# Patient Record
Sex: Female | Born: 1955 | Race: White | Hispanic: No | Marital: Married | Smoking: Former smoker
Health system: Southern US, Community
[De-identification: ages and names within clinical notes are randomized; demographics above are authoritative.]

## PROBLEM LIST (undated history)

## (undated) DIAGNOSIS — M1990 Unspecified osteoarthritis, unspecified site: Secondary | ICD-10-CM

## (undated) DIAGNOSIS — G459 Transient cerebral ischemic attack, unspecified: Secondary | ICD-10-CM

## (undated) DIAGNOSIS — Z973 Presence of spectacles and contact lenses: Secondary | ICD-10-CM

## (undated) DIAGNOSIS — Z8701 Personal history of pneumonia (recurrent): Secondary | ICD-10-CM

## (undated) DIAGNOSIS — Z8781 Personal history of (healed) traumatic fracture: Secondary | ICD-10-CM

## (undated) DIAGNOSIS — K635 Polyp of colon: Secondary | ICD-10-CM

## (undated) DIAGNOSIS — M810 Age-related osteoporosis without current pathological fracture: Secondary | ICD-10-CM

## (undated) DIAGNOSIS — F329 Major depressive disorder, single episode, unspecified: Secondary | ICD-10-CM

## (undated) DIAGNOSIS — Z808 Family history of malignant neoplasm of other organs or systems: Secondary | ICD-10-CM

## (undated) DIAGNOSIS — F1020 Alcohol dependence, uncomplicated: Secondary | ICD-10-CM

## (undated) DIAGNOSIS — N950 Postmenopausal bleeding: Secondary | ICD-10-CM

## (undated) DIAGNOSIS — Z8042 Family history of malignant neoplasm of prostate: Secondary | ICD-10-CM

## (undated) DIAGNOSIS — I10 Essential (primary) hypertension: Secondary | ICD-10-CM

## (undated) DIAGNOSIS — K219 Gastro-esophageal reflux disease without esophagitis: Secondary | ICD-10-CM

## (undated) DIAGNOSIS — F419 Anxiety disorder, unspecified: Secondary | ICD-10-CM

## (undated) DIAGNOSIS — E7800 Pure hypercholesterolemia, unspecified: Secondary | ICD-10-CM

## (undated) DIAGNOSIS — N840 Polyp of corpus uteri: Secondary | ICD-10-CM

## (undated) DIAGNOSIS — C541 Malignant neoplasm of endometrium: Secondary | ICD-10-CM

## (undated) DIAGNOSIS — I729 Aneurysm of unspecified site: Secondary | ICD-10-CM

## (undated) DIAGNOSIS — R200 Anesthesia of skin: Secondary | ICD-10-CM

## (undated) DIAGNOSIS — F32A Depression, unspecified: Secondary | ICD-10-CM

## (undated) DIAGNOSIS — F410 Panic disorder [episodic paroxysmal anxiety] without agoraphobia: Secondary | ICD-10-CM

## (undated) DIAGNOSIS — E041 Nontoxic single thyroid nodule: Secondary | ICD-10-CM

## (undated) DIAGNOSIS — K602 Anal fissure, unspecified: Secondary | ICD-10-CM

## (undated) DIAGNOSIS — Z87891 Personal history of nicotine dependence: Secondary | ICD-10-CM

## (undated) DIAGNOSIS — I517 Cardiomegaly: Secondary | ICD-10-CM

## (undated) DIAGNOSIS — Z8709 Personal history of other diseases of the respiratory system: Secondary | ICD-10-CM

## (undated) HISTORY — PX: TONSILLECTOMY: SUR1361

## (undated) HISTORY — DX: Family history of malignant neoplasm of other organs or systems: Z80.8

## (undated) HISTORY — DX: Personal history of nicotine dependence: Z87.891

## (undated) HISTORY — DX: Alcohol dependence, uncomplicated: F10.20

## (undated) HISTORY — DX: Polyp of colon: K63.5

## (undated) HISTORY — PX: KNEE ARTHROSCOPY: SUR90

## (undated) HISTORY — DX: Family history of malignant neoplasm of prostate: Z80.42

## (undated) HISTORY — PX: EYE SURGERY: SHX253

## (undated) HISTORY — PX: PLACEMENT OF BREAST IMPLANTS: SHX6334

## (undated) HISTORY — DX: Age-related osteoporosis without current pathological fracture: M81.0

---

## 1997-08-31 ENCOUNTER — Other Ambulatory Visit: Admission: RE | Admit: 1997-08-31 | Discharge: 1997-08-31 | Payer: Self-pay | Admitting: *Deleted

## 2001-07-04 ENCOUNTER — Other Ambulatory Visit: Admission: RE | Admit: 2001-07-04 | Discharge: 2001-07-04 | Payer: Self-pay | Admitting: *Deleted

## 2002-12-15 ENCOUNTER — Other Ambulatory Visit: Admission: RE | Admit: 2002-12-15 | Discharge: 2002-12-15 | Payer: Self-pay | Admitting: Obstetrics and Gynecology

## 2003-08-19 ENCOUNTER — Ambulatory Visit (HOSPITAL_COMMUNITY): Admission: RE | Admit: 2003-08-19 | Discharge: 2003-08-19 | Payer: Self-pay | Admitting: Surgery

## 2003-08-19 ENCOUNTER — Ambulatory Visit (HOSPITAL_BASED_OUTPATIENT_CLINIC_OR_DEPARTMENT_OTHER): Admission: RE | Admit: 2003-08-19 | Discharge: 2003-08-19 | Payer: Self-pay | Admitting: Surgery

## 2003-08-19 ENCOUNTER — Encounter (INDEPENDENT_AMBULATORY_CARE_PROVIDER_SITE_OTHER): Payer: Self-pay | Admitting: *Deleted

## 2004-02-15 ENCOUNTER — Ambulatory Visit: Payer: Self-pay | Admitting: Internal Medicine

## 2005-12-25 ENCOUNTER — Ambulatory Visit: Payer: Self-pay | Admitting: Internal Medicine

## 2008-05-12 ENCOUNTER — Ambulatory Visit: Payer: Self-pay | Admitting: Family Medicine

## 2008-05-12 DIAGNOSIS — J019 Acute sinusitis, unspecified: Secondary | ICD-10-CM

## 2008-05-12 LAB — CONVERTED CEMR LAB: Rapid Strep: NEGATIVE

## 2010-09-01 NOTE — Op Note (Signed)
Rebecca Gordon, Rebecca Gordon                         ACCOUNT NO.:  [id number]   MEDICAL RECORD NO.:  [id number]                   PATIENT TYPE:  AMB   LOCATION:  DSC                                  FACILITY:  [facility]   PHYSICIAN:  Sandria Bales. Ezzard Standing, M.D.               DATE OF BIRTH:  [birth year]   DATE OF PROCEDURE:  08/19/2003  DATE OF DISCHARGE:                                 OPERATIVE REPORT   PREOPERATIVE DIAGNOSIS:  Nodule, right forearm, possible foreign body  reaction.   POSTOPERATIVE DIAGNOSIS:  Nodule, right forearm, possible foreign body  reaction.   PROCEDURE:  Excision, nodule, right forearm.  This measures about 1 cm in  size.   SURGEON:  Sandria Bales. Ezzard Standing, M.D.   ANESTHESIA:  5 mL of 1% Xylocaine.   INDICATION FOR PROCEDURE:  Ms. Benedict felt that she got stuck with a stick  or something in her right forearm.  She did gardening last year, had a  persistent draining irritated mass on her right forearm.  She saw Dr.  Dorinda Hill, who biopsied this.  This showed fibrosing granulation with  polarizable material.  This was done on June 04, 2003.  Actually,  recently this thing has appeared to heal some but the patient still wants to  have this area excised, which I agree with if a possible foreign body in her  right forearm.   She presents to [facility] day surgery in the minor surgery room.  I cleaned the  area with Betadine solution and made an elliptical incision, which this mass  was about maybe 1 cm in size, excised the mass and closed it with  interrupted 3-0 nylon sutures.   I then sterilely dressed it.  She will see me back in 10 days for suture  removal and review of pathology.                                               Sandria Bales. Ezzard Standing, M.D.    DHN/MEDQ  D:  08/19/2003  T:  08/19/2003  Job:  [id number]   cc:   Dorinda Hill, M.D.   Angelena Sole, M.D. [facility]

## 2014-04-16 HISTORY — PX: CLAVICLE SURGERY: SHX598

## 2014-06-11 ENCOUNTER — Emergency Department (HOSPITAL_BASED_OUTPATIENT_CLINIC_OR_DEPARTMENT_OTHER)
Admission: EM | Admit: 2014-06-11 | Discharge: 2014-06-11 | Disposition: A | Discharge status: Home / Self Care | Payer: BLUE CROSS/BLUE SHIELD | Attending: Emergency Medicine | Admitting: Emergency Medicine

## 2014-06-11 ENCOUNTER — Emergency Department (HOSPITAL_BASED_OUTPATIENT_CLINIC_OR_DEPARTMENT_OTHER): Payer: BLUE CROSS/BLUE SHIELD

## 2014-06-11 ENCOUNTER — Encounter (HOSPITAL_BASED_OUTPATIENT_CLINIC_OR_DEPARTMENT_OTHER): Payer: Self-pay

## 2014-06-11 DIAGNOSIS — Y998 Other external cause status: Secondary | ICD-10-CM | POA: Diagnosis not present

## 2014-06-11 DIAGNOSIS — Y9289 Other specified places as the place of occurrence of the external cause: Secondary | ICD-10-CM | POA: Insufficient documentation

## 2014-06-11 DIAGNOSIS — Z87891 Personal history of nicotine dependence: Secondary | ICD-10-CM | POA: Diagnosis not present

## 2014-06-11 DIAGNOSIS — S42001A Fracture of unspecified part of right clavicle, initial encounter for closed fracture: Secondary | ICD-10-CM

## 2014-06-11 DIAGNOSIS — I10 Essential (primary) hypertension: Secondary | ICD-10-CM | POA: Diagnosis not present

## 2014-06-11 DIAGNOSIS — Y9389 Activity, other specified: Secondary | ICD-10-CM | POA: Insufficient documentation

## 2014-06-11 DIAGNOSIS — Z79899 Other long term (current) drug therapy: Secondary | ICD-10-CM | POA: Diagnosis not present

## 2014-06-11 DIAGNOSIS — S42024A Nondisplaced fracture of shaft of right clavicle, initial encounter for closed fracture: Secondary | ICD-10-CM | POA: Diagnosis not present

## 2014-06-11 DIAGNOSIS — W109XXA Fall (on) (from) unspecified stairs and steps, initial encounter: Secondary | ICD-10-CM | POA: Insufficient documentation

## 2014-06-11 DIAGNOSIS — S4991XA Unspecified injury of right shoulder and upper arm, initial encounter: Secondary | ICD-10-CM | POA: Diagnosis present

## 2014-06-11 HISTORY — DX: Essential (primary) hypertension: I10

## 2014-06-11 MED ORDER — OXYCODONE-ACETAMINOPHEN 5-325 MG PO TABS
1.0000 | ORAL_TABLET | Freq: Once | ORAL | Status: AC
  Administered 2014-06-11: 2 via ORAL
  Filled 2014-06-11: qty 2

## 2014-06-11 MED ORDER — OXYCODONE-ACETAMINOPHEN 5-325 MG PO TABS: 1.0000 | ORAL_TABLET | Freq: Four times a day (QID) | ORAL | Status: DC | PRN

## 2014-06-11 NOTE — ED Notes (Signed)
MD at bedside. 

## 2014-06-11 NOTE — Discharge Instructions (Signed)
Clavicle Fracture °The clavicle, also called the collarbone, is the long bone that connects your shoulder to your rib cage. You can feel your collarbone at the top of your shoulders and rib cage. A clavicle fracture is a broken clavicle. It is a common injury that can happen at any age.  °CAUSES °Common causes of a clavicle fracture include: °· A direct blow to your shoulder. °· A car accident. °· A fall, especially if you try to break your fall with an outstretched arm. °RISK FACTORS °You may be at increased risk if: °· You are younger than 25 years or older than 75 years. Most clavicle fractures happen to people who are younger than 25 years. °· You are a female. °· You play contact sports. °SIGNS AND SYMPTOMS °A fractured clavicle is painful. It also makes it hard to move your arm. Other signs and symptoms may include: °· A shoulder that drops downward and forward. °· Pain when trying to lift your shoulder. °· Bruising, swelling, and tenderness over your clavicle. °· A grinding noise when you try to move your shoulder. °· A bump over your clavicle. °DIAGNOSIS °Your health care provider can usually diagnose a clavicle fracture by asking about your injury and examining your shoulder and clavicle. He or she may take an X-ray to determine the position of your clavicle. °TREATMENT °Treatment depends on the position of your clavicle after the fracture: °· If the broken ends of the bone are not out of place, your health care provider may put your arm in a sling or wrap a support bandage around your chest (figure-of-eight wrap). °· If the broken ends of the bone are out of place, you may need surgery. Surgery may involve placing screws, pins, or plates to keep your clavicle stable while it heals. Healing may take about 3 months. °When your health care provider thinks your fracture has healed enough, you may have to do physical therapy to regain normal movement and build up your arm strength. °HOME CARE INSTRUCTIONS   °· Apply ice to the injured area: °¨ Put ice in a plastic bag. °¨ Place a towel between your skin and the bag. °¨ Leave the ice on for 20 minutes, 2-3 times a day. °· If you have a wrap or splint: °¨ Wear it all the time, and remove it only to take a bath or shower. °¨ When you bathe or shower, keep your shoulder in the same position as when the sling or wrap is on. °¨ Do not lift your arm. °· If you have a figure-of-eight wrap: °¨ Another person must tighten it every day. °¨ It should be tight enough to hold your shoulders back. °¨ Allow enough room to place your index finger between your body and the strap. °¨ Loosen the wrap immediately if you feel numbness or tingling in your hands. °· Only take medicines as directed by your health care provider. °· Avoid activities that make the injury or pain worse for 4-6 weeks after surgery. °· Keep all follow-up appointments. °SEEK MEDICAL CARE IF:  °Your medicine is not helping to relieve pain and swelling. °SEEK IMMEDIATE MEDICAL CARE IF:  °Your arm is numb, cold, or pale, even when the splint is loose. °MAKE SURE YOU:  °· Understand these instructions. °· Will watch your condition. °· Will get help right away if you are not doing well or get worse. °Document Released: 01/10/2005 Document Revised: 04/07/2013 Document Reviewed: 02/23/2013 °[employer]® Patient Information ©2015 [employer]. This information is   not intended to replace advice given to you by your health care provider. Make sure you discuss any questions you have with your health care provider.    Sling Use After Injury or Surgery You have been put in a sling today because of an injury or following surgery. If you have a tendon or bone injury it may take up to 6 weeks to heal. Use the sling as directed until your caregiver says it is no longer needed. The sling protects and keeps you from using the injured part. Hanging your arm in a sling will give rest and support to the injured part. This also helps  with comfort and healing. Slings are used for injuries made worse or more painful by movement. Examples include:  Broken arms.  Broken collarbones.  Shoulder injuries.  Following surgery. The sling should fit comfortably, with your elbow at one end of the sling and your hand at the other end. Your elbow is bent 90 degrees lying across your waist and rests in the sling with your thumb pointing up. Make sure that the hand of the injured arm does not droop down. That could stretch some nerves in the wrist. Your hand should be slightly higher than your elbow. You may also pad the sling behind your neck with some cloth or foam rubber.  A swathe may also be used if it is necessary to keep you from lifting your injured arm. A swathe is a wrap or ace bandage that goes around your chest over your injured arm.  To take the weight off your neck, some slings have a strap that goes around your neck and down your back. One strap is connected to the closed elbow side of the sling with the other end of the strap attached to the wrist side. With a sling like this, your injured shoulder, arm, wrist, or hand is in the sling, the weight is more on your shoulder and back. This is different from the illustration where the sling is supported only by the neck.  In an emergency, a sling can be as simple as a belt or towel tied around your neck to hold your forearm.  HOME CARE INSTRUCTIONS   Do not use your shoulder until instructed to by your caregiver.  If you have been prescribed physical therapy, keep appointments as directed.  For the first couple days following your injury and during times when you are sore, you may use ice on the injured area for 15-20 minutes 03-04 times per day while awake. Put the ice in a plastic bag and place a towel between the bag of ice and your skin. This will help keep the swelling down.  If there is numbness in the fifth finger and ring fingers you may need to pad the elbow to relieve  pressure on the ulnar nerve (the crazy bone).  Keep your arm on your chest when lying down.  If a plaster splint was applied, wear the splint until you are seen for a follow-up examination. Rest it on nothing harder than a pillow the first 24 hours. Do not get it wet. You may take it off to take a shower or bath unless instructed otherwise by your caregiver.  You may have been given an elastic bandage to use with the plaster splint or alone. The splint is too tight if you have numbness, tingling, or if your hand becomes cold and blue. Adjust or reapply the bandage to make it comfortable.  Only take over-the-counter  or prescription medicines for pain, discomfort, or fever as directed by your caregiver.  If range of motion exercises are permitted by your caregiver, do not go over the limits suggested. If you have increased pain from doing gentle exercises, stop the exercises until you see your caregiver again.  The length of time needed for healing depends on what your injury or surgery was. SEEK IMMEDIATE MEDICAL CARE IF:   You have an increase in bruising, swelling or pain in the area of your injury or surgery.  You notice a blue color of or coldness in your fingers.  Pain relief is not obtained with medications or any of your problems are getting worse. Document Released: 11/15/2003 Document Revised: 03/19/2012 Document Reviewed: 02/15/2007 [facility] Patient Information 2015 [place], [place]. This information is not intended to replace advice given to you by your health care provider. Make sure you discuss any questions you have with your health care provider.

## 2014-06-11 NOTE — ED Notes (Signed)
Pt reports missing a step last night and falling - now has discoloration to right collarbone, right shoulder pain, obvious deformity noted.

## 2014-06-11 NOTE — ED Provider Notes (Addendum)
CSN: [id number]     Arrival date & time 06/11/14  1135 History   First MD Initiated Contact with Patient 06/11/14 1229     Chief Complaint  Patient presents with  . Clavicle Injury     (Consider location/radiation/quality/duration/timing/severity/associated sxs/prior Treatment) HPI  59 year old female presents with right collarbone pain after falling down the stairs last night. She states she tripped and fell and landed directly on a stair on her right shoulder. She since developed swelling in neck and most is over her mid collarbone. No weakness or numbness in her arms or legs. Did not hit her head or neck. Holds her right shoulder pointed down but denies any shoulder pain or weakness. No trouble breathing. She has previously seen Dr. Onnie Graham of orthopedics.  Past Medical History  Diagnosis Date  . Hypertension    Past Surgical History  Procedure Laterality Date  . Tonsillectomy     History reviewed. No pertinent family history. History  Substance Use Topics  . Smoking status: Former Research scientist (life sciences)  . Smokeless tobacco: Not on file  . Alcohol Use: Yes     Comment: daily    OB History    No data available     Review of Systems  Musculoskeletal: Positive for joint swelling and arthralgias.  Skin: Positive for color change.  Neurological: Negative for weakness, numbness and headaches.  All other systems reviewed and are negative.     Allergies  Review of patient's allergies indicates no known allergies.  Home Medications   Prior to Admission medications   Medication Sig Start Date End Date Taking? Authorizing Provider  Blood Pressure KIT by Does not apply route.   Yes Historical Provider, MD  Meloxicam (MOBIC PO) Take by mouth.   Yes Historical Provider, MD  Rosuvastatin Calcium (CRESTOR PO) Take by mouth.   Yes Historical Provider, MD  SERTRALINE HCL PO Take by mouth.   Yes Historical Provider, MD  oxyCODONE-acetaminophen (PERCOCET) 5-325 MG per tablet Take 1-2 tablets by  mouth every 6 (six) hours as needed for severe pain. 06/11/14   Ephraim Hamburger, MD   BP 181/83 mmHg  Pulse 105  Temp(Src) 97.8 F (36.6 C)  Resp 20  Ht $R'5\' 4"'Ko$  (1.626 m)  Wt 135 lb (61.236 kg)  BMI 23.16 kg/m2  SpO2 98% Physical Exam  Constitutional: She is oriented to person, place, and time. She appears well-developed and well-nourished.  HENT:  Head: Normocephalic.    Right Ear: External ear normal.  Left Ear: External ear normal.  Nose: Nose normal.  Eyes: Right eye exhibits no discharge. Left eye exhibits no discharge.  Neck: Neck supple.  Cardiovascular: Intact distal pulses.   Pulses:      Radial pulses are 2+ on the right side.  Pulmonary/Chest: Effort normal and breath sounds normal. She exhibits tenderness and bony tenderness.    Musculoskeletal:       Right shoulder: She exhibits decreased range of motion. She exhibits no tenderness.  Normal strength, sensation and cap refill of right hand/arm.  Neurological: She is alert and oriented to person, place, and time.  Skin: Skin is warm and dry.  Nursing note and vitals reviewed.   ED Course  Procedures (including critical care time) Labs Review Labs Reviewed - No data to display  Imaging Review Dg Shoulder Right  06/11/2014   CLINICAL DATA:  [place], fell last night, pain.  Initial encounter.  EXAM: RIGHT SHOULDER - 2+ VIEW  COMPARISON:  None.  FINDINGS: No glenohumeral fracture or  dislocation. There is a comminuted mid clavicle fracture with overriding fragments. Soft tissue swelling.  IMPRESSION: Comminuted mid clavicle fracture with overriding.   Electronically Signed   By: Rolla Flatten M.D.   On: 06/11/2014 12:39   Ct Head Wo Contrast  06/11/2014   CLINICAL DATA:  Fall.  Head injury  EXAM: CT HEAD WITHOUT CONTRAST  TECHNIQUE: Contiguous axial images were obtained from the base of the skull through the vertex without intravenous contrast.  COMPARISON:  None.  FINDINGS: Ventricle size is normal. Negative for acute  or chronic infarction. Negative for hemorrhage or fluid collection. Negative for mass or edema. No shift of the midline structures.  Calvarium is intact. Right frontal scalp hematoma. Mild mucosal edema paranasal sinuses.  IMPRESSION: Right frontal scalp hematoma. No significant intracranial abnormality.   Electronically Signed   By: Franchot Gallo M.D.   On: 06/11/2014 13:53     EKG Interpretation None      MDM   Final diagnoses:  Right clavicle fracture, closed, initial encounter    Patient with a comminuted right clavicle fracture as above. No evidence of pneumothorax. No tenting of the skin or open fracture. Patient is neurovascular intact distally. Will give oral pain control, place and sling, and refer to orthopedics. Her shoulder appears located on the x-ray and she has no tenderness over her shoulder joint.  Ephraim Hamburger, MD 06/11/14 1325   After she was to be discharged she felt her head and noticed a knot. She then remembered that she did hit her head but did not lose consciousness. There is a soft hematoma there. Given this with a concurrent fracture, a CT was obtained and is negative for skull fracture or bleed. Stable for D/C    Ephraim Hamburger, MD 06/11/14 1420

## 2014-07-21 ENCOUNTER — Ambulatory Visit (INDEPENDENT_AMBULATORY_CARE_PROVIDER_SITE_OTHER): Payer: BLUE CROSS/BLUE SHIELD | Admitting: Psychiatry

## 2014-07-21 DIAGNOSIS — F1010 Alcohol abuse, uncomplicated: Secondary | ICD-10-CM | POA: Diagnosis not present

## 2014-07-27 ENCOUNTER — Ambulatory Visit (INDEPENDENT_AMBULATORY_CARE_PROVIDER_SITE_OTHER): Payer: BLUE CROSS/BLUE SHIELD | Admitting: Psychiatry

## 2014-07-27 DIAGNOSIS — F1010 Alcohol abuse, uncomplicated: Secondary | ICD-10-CM | POA: Diagnosis not present

## 2014-08-03 ENCOUNTER — Ambulatory Visit: Payer: BLUE CROSS/BLUE SHIELD | Admitting: Psychiatry

## 2014-08-05 ENCOUNTER — Ambulatory Visit (INDEPENDENT_AMBULATORY_CARE_PROVIDER_SITE_OTHER): Payer: BLUE CROSS/BLUE SHIELD | Admitting: Psychiatry

## 2014-08-05 DIAGNOSIS — F1010 Alcohol abuse, uncomplicated: Secondary | ICD-10-CM

## 2014-08-24 ENCOUNTER — Ambulatory Visit (INDEPENDENT_AMBULATORY_CARE_PROVIDER_SITE_OTHER): Payer: BLUE CROSS/BLUE SHIELD | Admitting: Psychiatry

## 2014-08-24 DIAGNOSIS — F1010 Alcohol abuse, uncomplicated: Secondary | ICD-10-CM

## 2014-09-07 ENCOUNTER — Ambulatory Visit (INDEPENDENT_AMBULATORY_CARE_PROVIDER_SITE_OTHER): Payer: BLUE CROSS/BLUE SHIELD | Admitting: Psychiatry

## 2014-09-07 DIAGNOSIS — F1010 Alcohol abuse, uncomplicated: Secondary | ICD-10-CM | POA: Diagnosis not present

## 2014-09-21 ENCOUNTER — Ambulatory Visit (INDEPENDENT_AMBULATORY_CARE_PROVIDER_SITE_OTHER): Payer: BLUE CROSS/BLUE SHIELD | Admitting: Psychiatry

## 2014-09-21 DIAGNOSIS — F1010 Alcohol abuse, uncomplicated: Secondary | ICD-10-CM

## 2014-10-07 ENCOUNTER — Ambulatory Visit: Payer: BLUE CROSS/BLUE SHIELD | Admitting: Psychiatry

## 2014-10-21 ENCOUNTER — Ambulatory Visit (INDEPENDENT_AMBULATORY_CARE_PROVIDER_SITE_OTHER): Payer: BLUE CROSS/BLUE SHIELD | Admitting: Psychiatry

## 2014-10-21 DIAGNOSIS — F1010 Alcohol abuse, uncomplicated: Secondary | ICD-10-CM

## 2014-11-25 ENCOUNTER — Ambulatory Visit: Payer: BLUE CROSS/BLUE SHIELD | Admitting: Psychiatry

## 2014-12-14 ENCOUNTER — Ambulatory Visit: Payer: BLUE CROSS/BLUE SHIELD | Admitting: Psychiatry

## 2014-12-15 ENCOUNTER — Other Ambulatory Visit: Payer: Self-pay | Admitting: Orthopedic Surgery

## 2014-12-15 DIAGNOSIS — Z4789 Encounter for other orthopedic aftercare: Secondary | ICD-10-CM

## 2014-12-21 ENCOUNTER — Other Ambulatory Visit: Payer: Self-pay | Admitting: Orthopedic Surgery

## 2014-12-21 ENCOUNTER — Ambulatory Visit
Admission: RE | Admit: 2014-12-21 | Discharge: 2014-12-21 | Disposition: A | Discharge status: Home / Self Care | Payer: BLUE CROSS/BLUE SHIELD | Source: Ambulatory Visit | Attending: Orthopedic Surgery | Admitting: Orthopedic Surgery

## 2014-12-21 DIAGNOSIS — Z4789 Encounter for other orthopedic aftercare: Secondary | ICD-10-CM

## 2015-04-17 DIAGNOSIS — Z8781 Personal history of (healed) traumatic fracture: Secondary | ICD-10-CM

## 2015-04-17 HISTORY — PX: COLONOSCOPY: SHX174

## 2015-04-17 HISTORY — DX: Personal history of (healed) traumatic fracture: Z87.81

## 2015-04-20 NOTE — Pre-Procedure Instructions (Addendum)
Rebecca Gordon  04/20/2015     Your procedure is scheduled on : Thursday April 28, 2015 at 10:00 AM.  Report to [facility] Admitting at 8:00 AM.  Call this number if you have problems the morning of surgery: [phone]    Remember:  Do not eat food or drink liquids after midnight.  Take these medicines the morning of surgery with A SIP OF WATER : Metoprolol (Toprol-XL), Esomeprazole (Nexium), Sertraline (Zoloft), Advair inhaler if needed   5 days prior to surgery, stop taking any vitamins, herbal medications, Meloxicam/Mobic, Mucinex DM, Omega 3, Tumeric, NSAIDS, Ibuprofen, Advil, Motrin, Aleve, etc   Do not wear jewelry, make-up or nail polish.  Do not wear lotions, powders, or perfumes.    Do not shave 48 hours prior to surgery.    Do not bring valuables to the hospital.  [facility] is not responsible for any belongings or valuables.  Contacts, dentures or bridgework may not be worn into surgery.  Leave your suitcase in the car.  After surgery it may be brought to your room.  For patients admitted to the hospital, discharge time will be determined by your treatment team.  Patients discharged the day of surgery will not be allowed to drive home.   Name and phone number of your driver:    Special instructions:  Shower using CHG soap the night before and the morning of your surgery  Please read over the following fact sheets that you were given. Pain Booklet, Coughing and Deep Breathing and Surgical Site Infection Prevention

## 2015-04-21 ENCOUNTER — Encounter (HOSPITAL_COMMUNITY)
Admission: RE | Admit: 2015-04-21 | Discharge: 2015-04-21 | Disposition: A | Discharge status: Home / Self Care | Payer: BLUE CROSS/BLUE SHIELD | Source: Ambulatory Visit | Attending: Orthopedic Surgery | Admitting: Orthopedic Surgery

## 2015-04-21 ENCOUNTER — Encounter (HOSPITAL_COMMUNITY): Payer: Self-pay

## 2015-04-21 DIAGNOSIS — Z01812 Encounter for preprocedural laboratory examination: Secondary | ICD-10-CM | POA: Insufficient documentation

## 2015-04-21 DIAGNOSIS — I10 Essential (primary) hypertension: Secondary | ICD-10-CM | POA: Diagnosis not present

## 2015-04-21 DIAGNOSIS — S42021A Displaced fracture of shaft of right clavicle, initial encounter for closed fracture: Secondary | ICD-10-CM | POA: Diagnosis not present

## 2015-04-21 DIAGNOSIS — Z01818 Encounter for other preprocedural examination: Secondary | ICD-10-CM | POA: Diagnosis present

## 2015-04-21 DIAGNOSIS — X58XXXA Exposure to other specified factors, initial encounter: Secondary | ICD-10-CM | POA: Diagnosis not present

## 2015-04-21 HISTORY — DX: Major depressive disorder, single episode, unspecified: F32.9

## 2015-04-21 HISTORY — DX: Personal history of pneumonia (recurrent): Z87.01

## 2015-04-21 HISTORY — DX: Gastro-esophageal reflux disease without esophagitis: K21.9

## 2015-04-21 HISTORY — DX: Anxiety disorder, unspecified: F41.9

## 2015-04-21 HISTORY — DX: Panic disorder (episodic paroxysmal anxiety): F41.0

## 2015-04-21 HISTORY — DX: Depression, unspecified: F32.A

## 2015-04-21 HISTORY — DX: Pure hypercholesterolemia, unspecified: E78.00

## 2015-04-21 HISTORY — DX: Personal history of other diseases of the respiratory system: Z87.09

## 2015-04-21 LAB — CBC WITH DIFFERENTIAL/PLATELET
BASOS ABS: 0 10*3/uL (ref 0.0–0.1)
BASOS PCT: 0 %
EOS PCT: 2 %
Eosinophils Absolute: 0.2 10*3/uL (ref 0.0–0.7)
HCT: 45.3 % (ref 36.0–46.0)
Hemoglobin: 15.2 g/dL — ABNORMAL HIGH (ref 12.0–15.0)
Lymphocytes Relative: 14 %
Lymphs Abs: 1.6 10*3/uL (ref 0.7–4.0)
MCH: 32.9 pg (ref 26.0–34.0)
MCHC: 33.6 g/dL (ref 30.0–36.0)
MCV: 98.1 fL (ref 78.0–100.0)
MONO ABS: 0.5 10*3/uL (ref 0.1–1.0)
Monocytes Relative: 4 %
Neutro Abs: 8.8 10*3/uL — ABNORMAL HIGH (ref 1.7–7.7)
Neutrophils Relative %: 80 %
PLATELETS: 254 10*3/uL (ref 150–400)
RBC: 4.62 MIL/uL (ref 3.87–5.11)
RDW: 12.6 % (ref 11.5–15.5)
WBC: 11.1 10*3/uL — AB (ref 4.0–10.5)

## 2015-04-21 LAB — COMPREHENSIVE METABOLIC PANEL
ALBUMIN: 4.1 g/dL (ref 3.5–5.0)
ALT: 34 U/L (ref 14–54)
AST: 21 U/L (ref 15–41)
Alkaline Phosphatase: 69 U/L (ref 38–126)
Anion gap: 9 (ref 5–15)
BUN: 14 mg/dL (ref 6–20)
CALCIUM: 9.5 mg/dL (ref 8.9–10.3)
CO2: 26 mmol/L (ref 22–32)
CREATININE: 0.59 mg/dL (ref 0.44–1.00)
Chloride: 104 mmol/L (ref 101–111)
GFR calc Af Amer: >60 mL/min (ref >60)
GFR calc non Af Amer: >60 mL/min (ref >60)
Glucose, Bld: 104 mg/dL — ABNORMAL HIGH (ref 65–99)
POTASSIUM: 4.3 mmol/L (ref 3.5–5.1)
SODIUM: 139 mmol/L (ref 135–145)
TOTAL PROTEIN: 6.8 g/dL (ref 6.5–8.1)
Total Bilirubin: 0.6 mg/dL (ref 0.3–1.2)

## 2015-04-21 LAB — PROTIME-INR
INR: 0.94 (ref 0.00–1.49)
Prothrombin Time: 12.8 seconds (ref 11.6–15.2)

## 2015-04-21 LAB — APTT: APTT: 29 s (ref 24–37)

## 2015-04-21 NOTE — Progress Notes (Signed)
PCP - Lennie Odor, PA at [facility] - denies  EKG - 04/21/15 CXR - denies  Echo/Stress test/Cardiac cath - denies  Patient denies chest pain and shortness of breath at PAT appointment.  Patient states that she has had a cold for approximately 4 days and had an appointment with her PCP on 04/20/15.  Requested LOV from PCP.  Patient denies a fever.

## 2015-04-28 ENCOUNTER — Ambulatory Visit (HOSPITAL_COMMUNITY): Payer: BLUE CROSS/BLUE SHIELD | Admitting: Anesthesiology

## 2015-04-28 ENCOUNTER — Ambulatory Visit (HOSPITAL_COMMUNITY): Payer: BLUE CROSS/BLUE SHIELD

## 2015-04-28 ENCOUNTER — Encounter (HOSPITAL_COMMUNITY): Payer: Self-pay | Admitting: *Deleted

## 2015-04-28 ENCOUNTER — Encounter (HOSPITAL_COMMUNITY)
Admission: RE | Disposition: A | Discharge status: Home / Self Care | Payer: Self-pay | Source: Ambulatory Visit | Attending: Orthopedic Surgery

## 2015-04-28 ENCOUNTER — Ambulatory Visit (HOSPITAL_COMMUNITY)
Admission: RE | Admit: 2015-04-28 | Discharge: 2015-04-28 | Disposition: A | Discharge status: Home / Self Care | Payer: BLUE CROSS/BLUE SHIELD | Source: Ambulatory Visit | Attending: Orthopedic Surgery | Admitting: Orthopedic Surgery

## 2015-04-28 DIAGNOSIS — I10 Essential (primary) hypertension: Secondary | ICD-10-CM | POA: Diagnosis not present

## 2015-04-28 DIAGNOSIS — Z791 Long term (current) use of non-steroidal anti-inflammatories (NSAID): Secondary | ICD-10-CM | POA: Insufficient documentation

## 2015-04-28 DIAGNOSIS — S42021K Displaced fracture of shaft of right clavicle, subsequent encounter for fracture with nonunion: Secondary | ICD-10-CM | POA: Insufficient documentation

## 2015-04-28 DIAGNOSIS — M1711 Unilateral primary osteoarthritis, right knee: Secondary | ICD-10-CM | POA: Diagnosis not present

## 2015-04-28 DIAGNOSIS — Z87891 Personal history of nicotine dependence: Secondary | ICD-10-CM | POA: Insufficient documentation

## 2015-04-28 DIAGNOSIS — E7800 Pure hypercholesterolemia, unspecified: Secondary | ICD-10-CM | POA: Diagnosis not present

## 2015-04-28 DIAGNOSIS — Z79899 Other long term (current) drug therapy: Secondary | ICD-10-CM | POA: Diagnosis not present

## 2015-04-28 DIAGNOSIS — X58XXXD Exposure to other specified factors, subsequent encounter: Secondary | ICD-10-CM | POA: Diagnosis not present

## 2015-04-28 DIAGNOSIS — Z419 Encounter for procedure for purposes other than remedying health state, unspecified: Secondary | ICD-10-CM

## 2015-04-28 DIAGNOSIS — K219 Gastro-esophageal reflux disease without esophagitis: Secondary | ICD-10-CM | POA: Diagnosis not present

## 2015-04-28 HISTORY — PX: ORIF CLAVICULAR FRACTURE: SHX5055

## 2015-04-28 SURGERY — OPEN REDUCTION INTERNAL FIXATION (ORIF) CLAVICULAR FRACTURE
Anesthesia: General | Laterality: Right

## 2015-04-28 MED ORDER — FENTANYL CITRATE (PF) 250 MCG/5ML IJ SOLN
INTRAMUSCULAR | Status: AC
  Filled 2015-04-28: qty 5

## 2015-04-28 MED ORDER — CHLORHEXIDINE GLUCONATE 4 % EX LIQD: 60.0000 mL | Freq: Once | CUTANEOUS | Status: DC

## 2015-04-28 MED ORDER — ROCURONIUM BROMIDE 100 MG/10ML IV SOLN
INTRAVENOUS | Status: DC | PRN
  Administered 2015-04-28: 40 mg via INTRAVENOUS
  Administered 2015-04-28: 10 mg via INTRAVENOUS

## 2015-04-28 MED ORDER — ONDANSETRON HCL 4 MG/2ML IJ SOLN
INTRAMUSCULAR | Status: DC | PRN
Start: 2015-04-28 — End: 2015-04-28
  Administered 2015-04-28: 4 mg via INTRAVENOUS

## 2015-04-28 MED ORDER — GLYCOPYRROLATE 0.2 MG/ML IJ SOLN
INTRAMUSCULAR | Status: AC
  Filled 2015-04-28: qty 2

## 2015-04-28 MED ORDER — ONDANSETRON HCL 4 MG/2ML IJ SOLN
INTRAMUSCULAR | Status: AC
  Filled 2015-04-28: qty 2

## 2015-04-28 MED ORDER — PROMETHAZINE HCL 25 MG/ML IJ SOLN: 6.2500 mg | INTRAMUSCULAR | Status: DC | PRN

## 2015-04-28 MED ORDER — FENTANYL CITRATE (PF) 100 MCG/2ML IJ SOLN
INTRAMUSCULAR | Status: DC | PRN
  Administered 2015-04-28 (×2): 50 ug via INTRAVENOUS

## 2015-04-28 MED ORDER — CEFAZOLIN SODIUM-DEXTROSE 2-3 GM-% IV SOLR
2.0000 g | INTRAVENOUS | Status: AC
  Administered 2015-04-28: 2 g via INTRAVENOUS
  Filled 2015-04-28: qty 50

## 2015-04-28 MED ORDER — OXYCODONE HCL 5 MG PO TABS: 5.0000 mg | ORAL_TABLET | Freq: Once | ORAL | Status: DC | PRN

## 2015-04-28 MED ORDER — MIDAZOLAM HCL 2 MG/2ML IJ SOLN
INTRAMUSCULAR | Status: AC
  Administered 2015-04-28: 2 mg
  Filled 2015-04-28: qty 2

## 2015-04-28 MED ORDER — LIDOCAINE HCL (CARDIAC) 20 MG/ML IV SOLN
INTRAVENOUS | Status: DC | PRN
  Administered 2015-04-28: 30 mg via INTRAVENOUS
  Administered 2015-04-28: 60 mg via INTRAVENOUS

## 2015-04-28 MED ORDER — MIDAZOLAM HCL 2 MG/2ML IJ SOLN: 2.0000 mg | Freq: Once | INTRAMUSCULAR | Status: DC

## 2015-04-28 MED ORDER — SODIUM CHLORIDE 0.9 % IV SOLN
10.0000 mg | INTRAVENOUS | Status: DC | PRN
  Administered 2015-04-28: 50 ug/min via INTRAVENOUS

## 2015-04-28 MED ORDER — LACTATED RINGERS IV SOLN
INTRAVENOUS | Status: DC
  Administered 2015-04-28: 09:00:00 via INTRAVENOUS

## 2015-04-28 MED ORDER — NEOSTIGMINE METHYLSULFATE 10 MG/10ML IV SOLN
INTRAVENOUS | Status: DC | PRN
Start: 2015-04-28 — End: 2015-04-28
  Administered 2015-04-28: 3 mg via INTRAVENOUS

## 2015-04-28 MED ORDER — OXYCODONE HCL 5 MG/5ML PO SOLN: 5.0000 mg | Freq: Once | ORAL | Status: DC | PRN

## 2015-04-28 MED ORDER — LIDOCAINE HCL (CARDIAC) 20 MG/ML IV SOLN
INTRAVENOUS | Status: AC
  Filled 2015-04-28: qty 5

## 2015-04-28 MED ORDER — FENTANYL CITRATE (PF) 100 MCG/2ML IJ SOLN
50.0000 ug | Freq: Once | INTRAMUSCULAR | Status: AC
  Administered 2015-04-28: 50 ug via INTRAVENOUS

## 2015-04-28 MED ORDER — LACTATED RINGERS IV SOLN
INTRAVENOUS | Status: DC | PRN
Start: 2015-04-28 — End: 2015-04-28

## 2015-04-28 MED ORDER — ONDANSETRON HCL 4 MG PO TABS: 4.0000 mg | ORAL_TABLET | Freq: Three times a day (TID) | ORAL | Status: DC | PRN

## 2015-04-28 MED ORDER — PROPOFOL 10 MG/ML IV BOLUS
INTRAVENOUS | Status: AC
  Filled 2015-04-28: qty 20

## 2015-04-28 MED ORDER — HYDROMORPHONE HCL 1 MG/ML IJ SOLN: 0.2500 mg | INTRAMUSCULAR | Status: DC | PRN

## 2015-04-28 MED ORDER — OXYCODONE-ACETAMINOPHEN 5-325 MG PO TABS: 1.0000 | ORAL_TABLET | ORAL | Status: DC | PRN

## 2015-04-28 MED ORDER — PROPOFOL 10 MG/ML IV BOLUS
INTRAVENOUS | Status: DC | PRN
  Administered 2015-04-28: 150 mg via INTRAVENOUS
  Administered 2015-04-28: 50 mg via INTRAVENOUS

## 2015-04-28 MED ORDER — BUPIVACAINE-EPINEPHRINE (PF) 0.5% -1:200000 IJ SOLN
INTRAMUSCULAR | Status: DC | PRN
  Administered 2015-04-28: 30 mL via PERINEURAL

## 2015-04-28 MED ORDER — 0.9 % SODIUM CHLORIDE (POUR BTL) OPTIME
TOPICAL | Status: DC | PRN
  Administered 2015-04-28: 1000 mL

## 2015-04-28 MED ORDER — FENTANYL CITRATE (PF) 100 MCG/2ML IJ SOLN
INTRAMUSCULAR | Status: AC
  Filled 2015-04-28: qty 2

## 2015-04-28 MED ORDER — GLYCOPYRROLATE 0.2 MG/ML IJ SOLN
INTRAMUSCULAR | Status: DC | PRN
  Administered 2015-04-28: 0.4 mg via INTRAVENOUS

## 2015-04-28 MED ORDER — ROCURONIUM BROMIDE 50 MG/5ML IV SOLN
INTRAVENOUS | Status: AC
  Filled 2015-04-28: qty 1

## 2015-04-28 MED ORDER — METHOCARBAMOL 500 MG PO TABS: 500.0000 mg | ORAL_TABLET | Freq: Three times a day (TID) | ORAL | Status: DC | PRN

## 2015-04-28 SURGICAL SUPPLY — 66 items
ADH SKN CLS APL DERMABOND .7 (GAUZE/BANDAGES/DRESSINGS) ×1
BIT DRILL 2.8X5 QR DISP (BIT) ×2 IMPLANT
CLOSURE WOUND 1/2 X4 (GAUZE/BANDAGES/DRESSINGS) ×1
COVER SURGICAL LIGHT HANDLE (MISCELLANEOUS) ×3 IMPLANT
DERMABOND ADVANCED (GAUZE/BANDAGES/DRESSINGS) ×2
DERMABOND ADVANCED .7 DNX12 (GAUZE/BANDAGES/DRESSINGS) ×1 IMPLANT
DRAPE C-ARM 42X72 X-RAY (DRAPES) ×3 IMPLANT
DRAPE IMP U-DRAPE 54X76 (DRAPES) ×3 IMPLANT
DRAPE INCISE IOBAN 66X45 STRL (DRAPES) ×3 IMPLANT
DRAPE ORTHO SPLIT 77X108 STRL (DRAPES) ×3
DRAPE SHEET LG 3/4 BI-LAMINATE (DRAPES) ×2 IMPLANT
DRAPE SURG 17X23 STRL (DRAPES) ×3 IMPLANT
DRAPE SURG ORHT 6 SPLT 77X108 (DRAPES) ×1 IMPLANT
DRAPE U-SHAPE 47X51 STRL (DRAPES) ×6 IMPLANT
DRSG EMULSION OIL 3X3 NADH (GAUZE/BANDAGES/DRESSINGS) ×3 IMPLANT
DRSG MEPILEX BORDER 4X8 (GAUZE/BANDAGES/DRESSINGS) ×3 IMPLANT
ELECT REM PT RETURN 9FT ADLT (ELECTROSURGICAL) ×3
ELECTRODE REM PT RTRN 9FT ADLT (ELECTROSURGICAL) ×1 IMPLANT
GAUZE SPONGE 4X4 12PLY STRL (GAUZE/BANDAGES/DRESSINGS) ×3 IMPLANT
GLOVE BIO SURGEON STRL SZ7.5 (GLOVE) ×3 IMPLANT
GLOVE BIO SURGEON STRL SZ8 (GLOVE) ×3 IMPLANT
GLOVE BIOGEL M 7.0 STRL (GLOVE) ×2 IMPLANT
GLOVE BIOGEL PI IND STRL 6 (GLOVE) IMPLANT
GLOVE BIOGEL PI IND STRL 7.0 (GLOVE) IMPLANT
GLOVE BIOGEL PI INDICATOR 6 (GLOVE) ×2
GLOVE BIOGEL PI INDICATOR 7.0 (GLOVE) ×2
GLOVE EUDERMIC 7 POWDERFREE (GLOVE) ×3 IMPLANT
GLOVE SS BIOGEL STRL SZ 7.5 (GLOVE) ×1 IMPLANT
GLOVE SUPERSENSE BIOGEL SZ 7.5 (GLOVE) ×2
GLOVE SURG SS PI 6.0 STRL IVOR (GLOVE) ×2 IMPLANT
GOWN STRL REUS W/ TWL LRG LVL3 (GOWN DISPOSABLE) ×1 IMPLANT
GOWN STRL REUS W/ TWL XL LVL3 (GOWN DISPOSABLE) ×2 IMPLANT
GOWN STRL REUS W/TWL LRG LVL3 (GOWN DISPOSABLE) ×3
GOWN STRL REUS W/TWL XL LVL3 (GOWN DISPOSABLE) ×12
KIT BASIN OR (CUSTOM PROCEDURE TRAY) ×3 IMPLANT
KIT ROOM TURNOVER OR (KITS) ×3 IMPLANT
MANIFOLD NEPTUNE II (INSTRUMENTS) ×3 IMPLANT
NDL HYPO 25GX1X1/2 BEV (NEEDLE) ×1 IMPLANT
NEEDLE HYPO 25GX1X1/2 BEV (NEEDLE) ×3 IMPLANT
NS IRRIG 1000ML POUR BTL (IV SOLUTION) ×3 IMPLANT
PACK SHOULDER (CUSTOM PROCEDURE TRAY) ×3 IMPLANT
PACK UNIVERSAL I (CUSTOM PROCEDURE TRAY) ×3 IMPLANT
PAD ARMBOARD 7.5X6 YLW CONV (MISCELLANEOUS) ×6 IMPLANT
PUTTY BONE DBX 5CC MIX (Putty) ×2 IMPLANT
SCREW LOCK 12X3.5X HEXALOBE (Screw) IMPLANT
SCREW LOCKING 3.5X10MM (Screw) ×2 IMPLANT
SCREW LOCKING 3.5X12 (Screw) ×3 IMPLANT
SCREW NON LOCK 3.5X10MM (Screw) ×2 IMPLANT
SCREW NONLOCK HEX 3.5X12 (Screw) ×2 IMPLANT
SLING ARM FOAM STRAP LRG (SOFTGOODS) ×3 IMPLANT
SPONGE LAP 18X18 X RAY DECT (DISPOSABLE) ×6 IMPLANT
SPONGE LAP 4X18 X RAY DECT (DISPOSABLE) ×6 IMPLANT
STAPLER VISISTAT 35W (STAPLE) ×3 IMPLANT
STRIP CLOSURE SKIN 1/2X4 (GAUZE/BANDAGES/DRESSINGS) ×2 IMPLANT
SUCTION FRAZIER TIP 10 FR DISP (SUCTIONS) ×3 IMPLANT
SUT MNCRL AB 3-0 PS2 18 (SUTURE) ×3 IMPLANT
SUT VIC AB 1 CT1 27 (SUTURE) ×3
SUT VIC AB 1 CT1 27XBRD ANBCTR (SUTURE) ×1 IMPLANT
SUT VIC AB 2-0 CT1 27 (SUTURE) ×3
SUT VIC AB 2-0 CT1 TAPERPNT 27 (SUTURE) ×1 IMPLANT
SUT VICRYL 0 CT 1 36IN (SUTURE) ×3 IMPLANT
SYR CONTROL 10ML LL (SYRINGE) ×3 IMPLANT
TOWEL OR 17X24 6PK STRL BLUE (TOWEL DISPOSABLE) ×3 IMPLANT
TOWEL OR 17X26 10 PK STRL BLUE (TOWEL DISPOSABLE) ×3 IMPLANT
WATER STERILE IRR 1000ML POUR (IV SOLUTION) ×3 IMPLANT
YANKAUER SUCT BULB TIP NO VENT (SUCTIONS) ×3 IMPLANT

## 2015-04-28 NOTE — Anesthesia Preprocedure Evaluation (Addendum)
Anesthesia Evaluation  Patient identified by MRN, date of birth, ID band Patient awake    Reviewed: Allergy & Precautions, NPO status , Patient's Chart, lab work & pertinent test results  Airway Mallampati: I  TM Distance: >3 FB Neck ROM: Full    Dental  (+) Teeth Intact, Dental Advisory Given   Pulmonary former smoker,    breath sounds clear to auscultation       Cardiovascular hypertension, Pt. on medications and Pt. on home beta blockers  Rhythm:Regular Rate:Normal     Neuro/Psych Anxiety Depression negative neurological ROS     GI/Hepatic Neg liver ROS, GERD  ,  Endo/Other  negative endocrine ROS  Renal/GU negative Renal ROS     Musculoskeletal  (+) Arthritis ,   Abdominal   Peds  Hematology negative hematology ROS (+)   Anesthesia Other Findings   Reproductive/Obstetrics                         Lab Results  Component Value Date   CREATININE 0.59 04/21/2015   BUN 14 04/21/2015   NA 139 04/21/2015   K 4.3 04/21/2015   CL 104 04/21/2015   CO2 26 04/21/2015   Lab Results  Component Value Date   WBC 11.1* 04/21/2015   HGB 15.2* 04/21/2015   HCT 45.3 04/21/2015   MCV 98.1 04/21/2015   PLT 254 04/21/2015    Anesthesia Physical Anesthesia Plan  ASA: II  Anesthesia Plan: General and Regional   Post-op Pain Management: GA combined w/ Regional for post-op pain   Induction: Intravenous  Airway Management Planned: Oral ETT  Additional Equipment:   Intra-op Plan:   Post-operative Plan: Extubation in OR  Informed Consent: I have reviewed the patients History and Physical, chart, labs and discussed the procedure including the risks, benefits and alternatives for the proposed anesthesia with the patient or authorized representative who has indicated his/her understanding and acceptance.   Dental advisory given  Plan Discussed with: CRNA  Anesthesia Plan Comments:         Anesthesia Quick Evaluation

## 2015-04-28 NOTE — Anesthesia Postprocedure Evaluation (Signed)
Anesthesia Post Note  Patient: Rebecca Gordon  Procedure(s) Performed: Procedure(s) (LRB): REVISION ORIF RIGHT CLAVICAL FRACTURE, ALLOGRAFT BONE GRAFTING (Right)  Patient location during evaluation: PACU Anesthesia Type: General and Regional Level of consciousness: awake and alert Pain management: pain level controlled Vital Signs Assessment: post-procedure vital signs reviewed and stable Respiratory status: spontaneous breathing Cardiovascular status: blood pressure returned to baseline Anesthetic complications: no    Last Vitals:  Filed Vitals:   04/28/15 1220 04/28/15 1226  BP: 126/69 146/64  Pulse: 80   Temp: 36.5 C 36.5 C  Resp: 16 14    Last Pain: There were no vitals filed for this visit.               Tiajuana Amass

## 2015-04-28 NOTE — Op Note (Signed)
NAMECONCEPCION, MCGURRIN NO.:  [id number]  MEDICAL RECORD NO.:  [id number]  LOCATION:  [facility]                         FACILITY:  [facility]  PHYSICIAN:  Metta Clines. Rebekka Lobello, M.D.  DATE OF BIRTH:  04/22/1955  DATE OF PROCEDURE:  04/28/2015 DATE OF DISCHARGE:                              OPERATIVE REPORT   PREOPERATIVE DIAGNOSIS:  Right midshaft clavicle fracture nonunion.  POSTOPERATIVE DIAGNOSIS:  Right midshaft clavicle fracture nonunion.  PROCEDURE:  Revision open reduction and internal fixation of right clavicle nonunion with allograft bone grafting.  SURGEON:  Metta Clines. Meggan Dhaliwal, M.D.  Terrence DupontOlivia Mackie A. Shuford, P.A.-C.  ANESTHESIA:  General endotracheal as well as an interscalene block.  ESTIMATED BLOOD LOSS:  Minimal.  DRAINS:  None.  HISTORY:  Ms. Belmer is a 60 year old female, who sustained a severely comminuted and displaced right midshaft clavicle fracture, for which, we had performed an initial open reduction and internal fixation number of months ago.  We have been following her clinically in well, she has done well with mobility, strength and function.  She has had radiographs, which show continued lucency at the fracture site, which has been somewhat progressive and she is now developing some discomfort about the shoulder girdle with motion.  CT scan does confirm there is a nonunion and due to the concern for impending failure of the fixation, she is brought to the operating room at this time for planned revision open reduction and internal fixation with bone grafting.  Preoperatively, I counseled Ms. Franchot Mimes regarding treatment options and potential risks versus benefits thereof.  Possible surgical complications were once again reviewed including the possibility of bleeding, infection, neurovascular injury, malunion, nonunion, loss of fixation, anesthetic complication, and possible need for additional surgery.  She understands and accepts and  agrees with our plan.  PROCEDURE IN DETAIL:  After undergoing routine preop evaluation, the patient did receive prophylactic antibiotics.  An interscalene block was established in holding area by the Anesthesia Department.  Placed supine on the operating table, underwent smooth induction of general endotracheal anesthesia.  Placed into beach-chair position and appropriately padded and protected.  Right shoulder girdle region was sterilely prepped and draped in standard fashion.  Time-out was called. We made a transverse incision along the infraclavicular region on the right following the same previous surgical incision.  Skin flaps were elevated and dissection carried deeply allowing [place] to expose the previously placed hardware and clavicle, focused on the midclavicular region at the site of nonunion.  We exposed the medial half of the plate presuming the soft tissue as the lateral sides since there was good fixation laterally and we felt that we could simply mobilize the medial fragment to allow clean out debridement of the nonunion site and then compression.  We gained circumferential exposure about the clavicle at the nonunion site.  Then, we carefully removed all the interposed soft tissue.  There were two interfragmentary screws, which were certainly loose and these were removed without difficulty and then we went ahead and used a rongeur and Kerrison rongeur to debride all soft tissue and debride the bone back to clean and healthy-appearing margins.  Once we were satisfied with  the debridement of all fibrous tissue at the nonunion site, we then removed the three medial screws.  We then utilized a combination of demineralized bone matrix as well as some cortical cancellous chips and a slurry and this was then interposed circumferentially at the nonunion site and then I used a tenaculum to allow compression between the medial and lateral margins of the clavicle to create direct  compression at the nonunion site between the bone end and this allowed excellent compression at the nonunion site with direct bone-to-bone contact and then used a compression lag screw on the medial limb, which obtained excellent bony fixation.  We then placed two locking screws medially.  At the completion, fluoroscopic images showed good overall alignment and good position of the hardware.  We then used the balance of the material from our combined demineralized bone matrix and corticocancellous chips to pack around the fracture site circumferentially and then carefully closed a thick soft tissue envelope to hold all of bone graft material and proper position about the nonunion site.  This was performed, closing the deep layer with a series of figure-of-eight #1 Vicryl sutures.  2-0 Monocryl was used for the subcu layer, intracuticular 3-0 Monocryl for the skin followed by Dermabond and then a Mepilex dressing.  Right arm was then placed into a sling.  The patient was awakened, extubated, and transferred to the recovery room in stable condition.  Jenetta Loges, PA-C was used as an Environmental consultant throughout this case, essential for help with positioning of the patient, positioning of the extremity, tissue retraction, manipulation of the fracture site, wound closer and intraoperative decision making.     Metta Clines. Hyland Mollenkopf, M.D.     KMS/MEDQ  D:  04/28/2015  T:  04/28/2015  Job:  [id number]

## 2015-04-28 NOTE — Op Note (Signed)
04/28/2015  11:58 AM  PATIENT:   Rebecca Gordon  60 y.o. female  PRE-OPERATIVE DIAGNOSIS:  RIGHT CLAVICLE NON UNION  POST-OPERATIVE DIAGNOSIS:  same  PROCEDURE:  Revision ORIF with bone grafting  SURGEON:  Meika Earll, Metta Clines. M.D.  ASSISTANTS: Shuford pac   ANESTHESIA:   GET + ISB  EBL: min  SPECIMEN:  none  Drains: none   PATIENT DISPOSITION:  PACU - hemodynamically stable.    PLAN OF CARE: Discharge to home after PACU  Dictation# [id number]   Contact # [phone]

## 2015-04-28 NOTE — Discharge Instructions (Signed)
° °   Ok to allow arm to dangle and to move it for hygiene. Sling on but can remove to allow arm to dangle and move elbow wrist and hand. Leave current dressing on until day 3, then can remove to perform dressing changes  You may shower on day 3 and allow water to run across incision, then pat it dry and perform dressing change Call [phone] for any questions or concerns   Discontinue your mobic and all anti inflammatories such as advil, aleve and ibuprofens until fracture is healed

## 2015-04-28 NOTE — Transfer of Care (Signed)
Immediate Anesthesia Transfer of Care Note  Patient: Rebecca Gordon  Procedure(s) Performed: Procedure(s): REVISION ORIF RIGHT CLAVICAL FRACTURE, ALLOGRAFT BONE GRAFTING (Right)  Patient Location: PACU  Anesthesia Type:GA combined with regional for post-op pain  Level of Consciousness: awake, alert  and oriented  Airway & Oxygen Therapy: Patient Spontanous Breathing and Patient connected to nasal cannula oxygen  Post-op Assessment: Report given to RN and Post -op Vital signs reviewed and stable  Post vital signs: Reviewed and stable  Last Vitals:  Filed Vitals:   04/28/15 0950 04/28/15 0955  BP: 144/55 123/62  Pulse: 85 79  Temp:    Resp: 15 14    Complications: No apparent anesthesia complications

## 2015-04-28 NOTE — Addendum Note (Signed)
Addendum  created 04/28/15 1415 by Kyung Rudd, CRNA   Modules edited: Anesthesia Medication Administration

## 2015-04-28 NOTE — Anesthesia Procedure Notes (Addendum)
Anesthesia Regional Block:  Interscalene brachial plexus block  Pre-Anesthetic Checklist: ,, timeout performed, Correct Patient, Correct Site, Correct Laterality, Correct Procedure, Correct Position, site marked, Risks and benefits discussed,  Surgical consent,  Pre-op evaluation,  At surgeon's request and post-op pain management  Laterality: Right  Prep: chloraprep       Needles:  Injection technique: Single-shot  Needle Type: Echogenic Stimulator Needle     Needle Length: 5cm 5 cm Needle Gauge: 21 and 21 G    Additional Needles:  Procedures: ultrasound guided (picture in chart) and nerve stimulator Interscalene brachial plexus block  Nerve Stimulator or Paresthesia:  Response: deltoid, 0.5 mA,   Additional Responses:   Narrative:  Start time: 04/28/2015 9:35 AM End time: 04/28/2015 9:42 AM Injection made incrementally with aspirations every 5 mL.  Performed by: Personally  Anesthesiologist: Suzette Battiest  Additional Notes: Risks and benefits discussed. Pt tolerated well with no immediate complications. 25cc's injected peri-neurally and an additional 5cc's injected under the lateral border of the SCM for the superficial cervical plexus.   Procedure Name: Intubation Date/Time: 04/28/2015 10:18 AM Performed by: Rush Farmer E Pre-anesthesia Checklist: Patient identified, Emergency Drugs available, Suction available, Patient being monitored and Timeout performed Patient Re-evaluated:Patient Re-evaluated prior to inductionOxygen Delivery Method: Circle system utilized Preoxygenation: Pre-oxygenation with 100% oxygen Intubation Type: IV induction Ventilation: Mask ventilation without difficulty Laryngoscope Size: Mac and 3 Grade View: Grade II Tube type: Oral Tube size: 7.0 mm Number of attempts: 1 Airway Equipment and Method: Stylet Placement Confirmation: ETT inserted through vocal cords under direct vision,  positive ETCO2 and breath sounds checked- equal and  bilateral Secured at: 21 cm Tube secured with: Tape Dental Injury: Teeth and Oropharynx as per pre-operative assessment

## 2015-04-28 NOTE — H&P (Signed)
Rebecca Gordon    Chief Complaint: RIGHT CLAVICLE NON UNION HPI: The patient is a 60 y.o. female with right mid shaft clavicle fracture non-union  Past Medical History  Diagnosis Date  . Hypertension   . High cholesterol   . Anxiety   . Panic attacks   . History of pneumonia   . History of bronchitis   . Depression   . GERD (gastroesophageal reflux disease)     Nexium  . Arthritis     "right knee"    Past Surgical History  Procedure Laterality Date  . Tonsillectomy    . Clavicle surgery Right   . Placement of breast implants Bilateral   . Colonoscopy    . Eye surgery Right     "growth on eye removed"    History reviewed. No pertinent family history.  Social History:  reports that she quit smoking 11 days ago. She does not have any smokeless tobacco history on file. She reports that she drinks alcohol. She reports that she does not use illicit drugs.  Allergies: No Known Allergies  Medications Prior to Admission  Medication Sig Dispense Refill  . Calcium Carbonate-Vit D-Min (CALCIUM 1200 PO) Take 1 tablet by mouth daily.    . cholecalciferol (VITAMIN D) 1000 units tablet Take 1,000 Units by mouth 2 (two) times daily.    Marland Kitchen dextromethorphan-guaiFENesin (MUCINEX DM) 30-600 MG 12hr tablet Take 1 tablet by mouth 2 (two) times daily.    Marland Kitchen esomeprazole (NEXIUM) 20 MG capsule Take 20 mg by mouth daily at 12 noon.    . Fluticasone-Salmeterol (ADVAIR) 100-50 MCG/DOSE AEPB Inhale 1 puff into the lungs 2 (two) times daily. Started 04/20/15;    . losartan (COZAAR) 100 MG tablet Take 100 mg by mouth daily.    . meloxicam (MOBIC) 15 MG tablet Take 15 mg by mouth daily.    . metoprolol succinate (TOPROL-XL) 25 MG 24 hr tablet Take 25 mg by mouth daily.    . Multiple Vitamins-Minerals (HAIR/SKIN/NAILS PO) Take 2,500 mcg by mouth daily.    . Omega 3 1200 MG CAPS Take 1,200 mg by mouth 2 (two) times daily.    Marland Kitchen OVER THE COUNTER MEDICATION Take 538 mg by mouth 2 (two) times daily. OTC.  Tumeric    . Probiotic Product (ALIGN) 4 MG CAPS Take 1 capsule by mouth daily.    . rosuvastatin (CRESTOR) 20 MG tablet Take 20 mg by mouth daily.    . sertraline (ZOLOFT) 50 MG tablet Take 50 mg by mouth daily.    . Blood Pressure KIT by Does not apply route.    . Meloxicam (MOBIC PO) Take by mouth.    . Rosuvastatin Calcium (CRESTOR PO) Take by mouth.    . SERTRALINE HCL PO Take by mouth.       Physical Exam: right shoulder with excellent motion and exam as noted at recent office visits  Vitals  Temp:  [97.9 F (36.6 C)] 97.9 F (36.6 C) (01/12 0830) Pulse Rate:  [79] 79 (01/12 0830) Resp:  [20] 20 (01/12 0830) BP: (163)/(61) 163/61 mmHg (01/12 0830) SpO2:  [99 %] 99 % (01/12 0830) Weight:  [62.596 kg (138 lb)] 62.596 kg (138 lb) (01/12 0830)  Assessment/Plan  Impression: RIGHT CLAVICLE NON UNION  Plan of Action: Procedure(s): REVISION ORIF RIGHT CLAVICAL FRACTURE, ALLOGRAFT BONE GRAFTING  Rebecca Gordon M Rebecca Gordon 04/28/2015, 9:30 AM Contact # [phone]

## 2015-04-29 ENCOUNTER — Encounter (HOSPITAL_COMMUNITY): Payer: Self-pay | Admitting: Orthopedic Surgery

## 2015-08-10 ENCOUNTER — Other Ambulatory Visit: Payer: Self-pay | Admitting: Orthopedic Surgery

## 2015-08-10 DIAGNOSIS — Z780 Asymptomatic menopausal state: Secondary | ICD-10-CM

## 2015-08-26 ENCOUNTER — Ambulatory Visit
Admission: RE | Admit: 2015-08-26 | Discharge: 2015-08-26 | Disposition: A | Discharge status: Home / Self Care | Payer: BLUE CROSS/BLUE SHIELD | Source: Ambulatory Visit | Attending: Orthopedic Surgery | Admitting: Orthopedic Surgery

## 2015-08-26 DIAGNOSIS — Z780 Asymptomatic menopausal state: Secondary | ICD-10-CM

## 2015-09-02 LAB — HM COLONOSCOPY

## 2016-09-14 DIAGNOSIS — G459 Transient cerebral ischemic attack, unspecified: Secondary | ICD-10-CM

## 2016-09-14 DIAGNOSIS — I729 Aneurysm of unspecified site: Secondary | ICD-10-CM

## 2016-09-14 HISTORY — DX: Transient cerebral ischemic attack, unspecified: G45.9

## 2016-09-14 HISTORY — DX: Aneurysm of unspecified site: I72.9

## 2016-09-22 ENCOUNTER — Observation Stay (HOSPITAL_COMMUNITY): Payer: Managed Care, Other (non HMO)

## 2016-09-22 ENCOUNTER — Emergency Department (HOSPITAL_COMMUNITY): Payer: Managed Care, Other (non HMO)

## 2016-09-22 ENCOUNTER — Observation Stay (HOSPITAL_COMMUNITY)
Admission: EM | Admit: 2016-09-22 | Discharge: 2016-09-24 | Disposition: A | Discharge status: Home / Self Care | Payer: Managed Care, Other (non HMO) | Attending: Family Medicine | Admitting: Family Medicine

## 2016-09-22 ENCOUNTER — Encounter (HOSPITAL_COMMUNITY): Payer: Self-pay

## 2016-09-22 DIAGNOSIS — G459 Transient cerebral ischemic attack, unspecified: Secondary | ICD-10-CM

## 2016-09-22 DIAGNOSIS — Z7901 Long term (current) use of anticoagulants: Secondary | ICD-10-CM | POA: Insufficient documentation

## 2016-09-22 DIAGNOSIS — I6523 Occlusion and stenosis of bilateral carotid arteries: Secondary | ICD-10-CM | POA: Insufficient documentation

## 2016-09-22 DIAGNOSIS — F17210 Nicotine dependence, cigarettes, uncomplicated: Secondary | ICD-10-CM | POA: Diagnosis not present

## 2016-09-22 DIAGNOSIS — Z7982 Long term (current) use of aspirin: Secondary | ICD-10-CM | POA: Insufficient documentation

## 2016-09-22 DIAGNOSIS — R269 Unspecified abnormalities of gait and mobility: Secondary | ICD-10-CM | POA: Insufficient documentation

## 2016-09-22 DIAGNOSIS — I10 Essential (primary) hypertension: Secondary | ICD-10-CM | POA: Insufficient documentation

## 2016-09-22 DIAGNOSIS — R202 Paresthesia of skin: Secondary | ICD-10-CM | POA: Diagnosis not present

## 2016-09-22 DIAGNOSIS — I639 Cerebral infarction, unspecified: Secondary | ICD-10-CM | POA: Diagnosis not present

## 2016-09-22 DIAGNOSIS — D72829 Elevated white blood cell count, unspecified: Secondary | ICD-10-CM | POA: Insufficient documentation

## 2016-09-22 DIAGNOSIS — K219 Gastro-esophageal reflux disease without esophagitis: Secondary | ICD-10-CM | POA: Insufficient documentation

## 2016-09-22 DIAGNOSIS — Z79899 Other long term (current) drug therapy: Secondary | ICD-10-CM | POA: Diagnosis not present

## 2016-09-22 DIAGNOSIS — F329 Major depressive disorder, single episode, unspecified: Secondary | ICD-10-CM | POA: Insufficient documentation

## 2016-09-22 DIAGNOSIS — Z87891 Personal history of nicotine dependence: Secondary | ICD-10-CM | POA: Diagnosis present

## 2016-09-22 DIAGNOSIS — I671 Cerebral aneurysm, nonruptured: Secondary | ICD-10-CM | POA: Insufficient documentation

## 2016-09-22 DIAGNOSIS — F410 Panic disorder [episodic paroxysmal anxiety] without agoraphobia: Secondary | ICD-10-CM | POA: Insufficient documentation

## 2016-09-22 DIAGNOSIS — E7800 Pure hypercholesterolemia, unspecified: Secondary | ICD-10-CM | POA: Insufficient documentation

## 2016-09-22 DIAGNOSIS — R200 Anesthesia of skin: Secondary | ICD-10-CM | POA: Diagnosis present

## 2016-09-22 DIAGNOSIS — E785 Hyperlipidemia, unspecified: Secondary | ICD-10-CM | POA: Diagnosis not present

## 2016-09-22 DIAGNOSIS — Z720 Tobacco use: Secondary | ICD-10-CM | POA: Diagnosis not present

## 2016-09-22 LAB — COMPREHENSIVE METABOLIC PANEL
ALBUMIN: 4 g/dL (ref 3.5–5.0)
ALK PHOS: 69 U/L (ref 38–126)
ALT: 20 U/L (ref 14–54)
ANION GAP: 12 (ref 5–15)
AST: 17 U/L (ref 15–41)
BUN: 11 mg/dL (ref 6–20)
CALCIUM: 9.2 mg/dL (ref 8.9–10.3)
CHLORIDE: 103 mmol/L (ref 101–111)
CO2: 21 mmol/L — ABNORMAL LOW (ref 22–32)
Creatinine, Ser: 0.62 mg/dL (ref 0.44–1.00)
GFR calc Af Amer: >60 mL/min (ref >60)
GFR calc non Af Amer: >60 mL/min (ref >60)
GLUCOSE: 95 mg/dL (ref 65–99)
POTASSIUM: 3.5 mmol/L (ref 3.5–5.1)
SODIUM: 136 mmol/L (ref 135–145)
Total Bilirubin: 0.6 mg/dL (ref 0.3–1.2)
Total Protein: 6.4 g/dL — ABNORMAL LOW (ref 6.5–8.1)

## 2016-09-22 LAB — CBG MONITORING, ED: GLUCOSE-CAPILLARY: 89 mg/dL (ref 65–99)

## 2016-09-22 LAB — URINALYSIS, ROUTINE W REFLEX MICROSCOPIC
BILIRUBIN URINE: NEGATIVE
Glucose, UA: NEGATIVE mg/dL
Ketones, ur: NEGATIVE mg/dL
Leukocytes, UA: NEGATIVE
NITRITE: NEGATIVE
PROTEIN: NEGATIVE mg/dL
RBC / HPF: NONE SEEN RBC/hpf (ref 0–5)
Specific Gravity, Urine: 1.002 — ABNORMAL LOW (ref 1.005–1.030)
Squamous Epithelial / LPF: NONE SEEN
pH: 6 (ref 5.0–8.0)

## 2016-09-22 LAB — RAPID URINE DRUG SCREEN, HOSP PERFORMED
Amphetamines: NOT DETECTED
Barbiturates: NOT DETECTED
Benzodiazepines: NOT DETECTED
Cocaine: NOT DETECTED
Opiates: NOT DETECTED
Tetrahydrocannabinol: NOT DETECTED

## 2016-09-22 LAB — I-STAT CHEM 8, ED
BUN: 15 mg/dL (ref 6–20)
CHLORIDE: 103 mmol/L (ref 101–111)
Calcium, Ion: 1.15 mmol/L (ref 1.15–1.40)
Creatinine, Ser: 0.7 mg/dL (ref 0.44–1.00)
Glucose, Bld: 90 mg/dL (ref 65–99)
HEMATOCRIT: 43 % (ref 36.0–46.0)
HEMOGLOBIN: 14.6 g/dL (ref 12.0–15.0)
POTASSIUM: 3.6 mmol/L (ref 3.5–5.1)
SODIUM: 138 mmol/L (ref 135–145)
TCO2: 23 mmol/L (ref 0–100)

## 2016-09-22 LAB — CBC
HCT: 42.7 % (ref 36.0–46.0)
Hemoglobin: 14.7 g/dL (ref 12.0–15.0)
MCH: 33.4 pg (ref 26.0–34.0)
MCHC: 34.4 g/dL (ref 30.0–36.0)
MCV: 97 fL (ref 78.0–100.0)
PLATELETS: 285 10*3/uL (ref 150–400)
RBC: 4.4 MIL/uL (ref 3.87–5.11)
RDW: 13 % (ref 11.5–15.5)
WBC: 12.3 10*3/uL — AB (ref 4.0–10.5)

## 2016-09-22 LAB — MAGNESIUM: Magnesium: 2 mg/dL (ref 1.7–2.4)

## 2016-09-22 LAB — I-STAT TROPONIN, ED: Troponin i, poc: 0 ng/mL (ref 0.00–0.08)

## 2016-09-22 LAB — DIFFERENTIAL
Basophils Absolute: 0.1 10*3/uL (ref 0.0–0.1)
Basophils Relative: 1 %
EOS PCT: 3 %
Eosinophils Absolute: 0.4 10*3/uL (ref 0.0–0.7)
LYMPHS PCT: 32 %
Lymphs Abs: 3.9 10*3/uL (ref 0.7–4.0)
Monocytes Absolute: 0.7 10*3/uL (ref 0.1–1.0)
Monocytes Relative: 6 %
NEUTROS PCT: 58 %
Neutro Abs: 7.2 10*3/uL (ref 1.7–7.7)

## 2016-09-22 LAB — APTT: aPTT: 29 seconds (ref 24–36)

## 2016-09-22 LAB — PROTIME-INR
INR: 0.91
PROTHROMBIN TIME: 12.2 s (ref 11.4–15.2)

## 2016-09-22 MED ORDER — METOPROLOL SUCCINATE ER 25 MG PO TB24
50.0000 mg | ORAL_TABLET | Freq: Every day | ORAL | Status: DC
  Administered 2016-09-23 – 2016-09-24 (×2): 50 mg via ORAL
  Filled 2016-09-22 (×2): qty 2
  Filled 2016-09-22: qty 1

## 2016-09-22 MED ORDER — LORAZEPAM 2 MG/ML IJ SOLN
INTRAMUSCULAR | Status: AC
  Administered 2016-09-22: 0.5 mg via INTRAVENOUS
  Filled 2016-09-22: qty 1

## 2016-09-22 MED ORDER — AMLODIPINE BESYLATE 5 MG PO TABS
10.0000 mg | ORAL_TABLET | Freq: Every day | ORAL | Status: DC
  Administered 2016-09-23 – 2016-09-24 (×2): 10 mg via ORAL
  Filled 2016-09-22 (×2): qty 2

## 2016-09-22 MED ORDER — SERTRALINE HCL 100 MG PO TABS
100.0000 mg | ORAL_TABLET | Freq: Every day | ORAL | Status: DC
  Administered 2016-09-23 – 2016-09-24 (×2): 100 mg via ORAL
  Filled 2016-09-22 (×2): qty 1

## 2016-09-22 MED ORDER — PANTOPRAZOLE SODIUM 40 MG PO TBEC
40.0000 mg | DELAYED_RELEASE_TABLET | Freq: Every day | ORAL | Status: DC
  Administered 2016-09-23 – 2016-09-24 (×2): 40 mg via ORAL
  Filled 2016-09-22 (×2): qty 1

## 2016-09-22 MED ORDER — STROKE: EARLY STAGES OF RECOVERY BOOK
Freq: Once | Status: AC
  Administered 2016-09-23: 01:00:00
  Filled 2016-09-22 (×2): qty 1

## 2016-09-22 MED ORDER — ACETAMINOPHEN 160 MG/5ML PO SOLN: 650.0000 mg | ORAL | Status: DC | PRN

## 2016-09-22 MED ORDER — NICOTINE 21 MG/24HR TD PT24
21.0000 mg | MEDICATED_PATCH | Freq: Every day | TRANSDERMAL | Status: DC
  Administered 2016-09-23 – 2016-09-24 (×2): 21 mg via TRANSDERMAL
  Filled 2016-09-22 (×2): qty 1

## 2016-09-22 MED ORDER — ROSUVASTATIN CALCIUM 20 MG PO TABS
20.0000 mg | ORAL_TABLET | Freq: Every day | ORAL | Status: DC
  Administered 2016-09-23: 20 mg via ORAL
  Filled 2016-09-22 (×2): qty 1

## 2016-09-22 MED ORDER — SENNOSIDES-DOCUSATE SODIUM 8.6-50 MG PO TABS: 1.0000 | ORAL_TABLET | Freq: Every evening | ORAL | Status: DC | PRN

## 2016-09-22 MED ORDER — ACETAMINOPHEN 325 MG PO TABS
650.0000 mg | ORAL_TABLET | ORAL | Status: DC | PRN
  Administered 2016-09-23: 650 mg via ORAL
  Filled 2016-09-22: qty 2

## 2016-09-22 MED ORDER — ACETAMINOPHEN 650 MG RE SUPP: 650.0000 mg | RECTAL | Status: DC | PRN

## 2016-09-22 MED ORDER — LOSARTAN POTASSIUM 50 MG PO TABS
100.0000 mg | ORAL_TABLET | Freq: Every day | ORAL | Status: DC
  Administered 2016-09-23 – 2016-09-24 (×2): 100 mg via ORAL
  Filled 2016-09-22 (×2): qty 2

## 2016-09-22 MED ORDER — SODIUM CHLORIDE 0.9 % IV SOLN
INTRAVENOUS | Status: DC
  Administered 2016-09-23 (×2): via INTRAVENOUS

## 2016-09-22 MED ORDER — LORAZEPAM 2 MG/ML IJ SOLN
0.5000 mg | Freq: Once | INTRAMUSCULAR | Status: AC
  Administered 2016-09-22: 0.5 mg via INTRAVENOUS

## 2016-09-22 MED ORDER — ENOXAPARIN SODIUM 40 MG/0.4ML ~~LOC~~ SOLN
40.0000 mg | SUBCUTANEOUS | Status: DC
  Administered 2016-09-23 – 2016-09-24 (×3): 40 mg via SUBCUTANEOUS
  Filled 2016-09-22 (×3): qty 0.4

## 2016-09-22 NOTE — H&P (Signed)
History and Physical    AAMORI MCMASTERS HEN:[id number] DOB: April 19, 1955 DOA: 09/22/2016  Referring MD/NP/PA: Dr. Jeanell Sparrow PCP: [name], Alferd Apa, DO  Patient coming from: home via EMS  Chief Complaint:  Numbness  HPI: Rebecca Gordon is a 61 y.o. right hand dominant female with medical history significant of  HTN, anxiety, and tobacco abuse; who presents with complaints of numbness. Patient reports not feeling well at baseline today, but symptoms started this afternoon while walking into a retail store. Reports acute onset of numbness of her lips, tongue, and left hand. Associated symptoms include feelings of lightheadedness. Patient went home and then around 6:25 PM became acutely worse feeling disoriented and off balance for which patient fell. She did not have any significant trauma to her head or lose consciousness. She reports that it felt like it was a process to move her feet and didn't try to get back up. She called 911 as she is already looked up earlier the symptoms that she was having and there was concern for stroke. Patient also incidentally makes note of intermittent neck stiffness. Denies having any headache, change in vision, nausea, vomiting, palpitations, recent sick contacts, or recent changes in medications. The last medication change increased dose of generic Zoloft from 50-100 mg of calcium channel blocker for blood pressure over 1-2 months ago. On EMS arrival systolics blood pressure was noted to be 200. No treatment was given en route.  ED Course: Upon admission to the emergency department patient was seen to be afebrile with vital signs relatively within normal limits. Labs revealed WBC 12.3, UDS negative, and UA wnl. CT scan the brain showed no acute signs of a stroke, but did show some signs of mild sinus disease.  Review of Systems: As per HPI otherwise 10 point review of systems negative.   Past Medical History:  Diagnosis Date  . Anxiety   . Arthritis    "right knee"  .  Depression   . GERD (gastroesophageal reflux disease)    Nexium  . High cholesterol   . History of bronchitis   . History of pneumonia   . Hypertension   . Panic attacks     Past Surgical History:  Procedure Laterality Date  . CLAVICLE SURGERY Right   . COLONOSCOPY    . EYE SURGERY Right    "growth on eye removed"  . ORIF CLAVICULAR FRACTURE Right 04/28/2015   Procedure: REVISION ORIF RIGHT CLAVICAL FRACTURE, ALLOGRAFT BONE GRAFTING;  Surgeon: Justice Britain, MD;  Location: [place];  Service: Orthopedics;  Laterality: Right;  . PLACEMENT OF BREAST IMPLANTS Bilateral   . TONSILLECTOMY       reports that she quit smoking about 17 months ago. She has never used smokeless tobacco. She reports that she drinks alcohol. She reports that she does not use drugs.  No Known Allergies  History reviewed. No pertinent family history.  Prior to Admission medications   Medication Sig Start Date End Date Taking? Authorizing Provider  amLODipine (NORVASC) 10 MG tablet Take 10 mg by mouth daily. 07/21/16  Yes [provider]  Calcium Carbonate-Vit D-Min (CALCIUM 1200 PO) Take 1 tablet by mouth daily.   Yes [provider]  cholecalciferol (VITAMIN D) 1000 units tablet Take 1,000 Units by mouth 2 (two) times daily.   Yes [provider]  esomeprazole (NEXIUM) 20 MG capsule Take 20 mg by mouth daily before breakfast.    Yes [provider]  losartan (COZAAR) 100 MG tablet Take 100  mg by mouth daily.   Yes [provider]  metoprolol succinate (TOPROL-XL) 50 MG 24 hr tablet Take 50 mg by mouth daily. 06/15/16  Yes [provider]  Multiple Vitamins-Minerals (ONE-A-DAY WOMENS 50+ ADVANTAGE) TABS Take 1 tablet by mouth daily.   Yes [provider]  Omega 3 1200 MG CAPS Take 1,200 mg by mouth 2 (two) times daily.   Yes [provider]  rosuvastatin (CRESTOR) 20 MG tablet Take 20 mg by mouth daily.   Yes [provider]  sertraline  (ZOLOFT) 100 MG tablet Take 100 mg by mouth daily. 08/30/16  Yes [provider]  TURMERIC PO Take 1 capsule by mouth 2 (two) times daily.   Yes [provider]  methocarbamol (ROBAXIN) 500 MG tablet Take 1 tablet (500 mg total) by mouth every 8 (eight) hours as needed for muscle spasms. Patient not taking: Reported on 09/22/2016 04/28/15   Shuford, Olivia Mackie, PA-C  ondansetron (ZOFRAN) 4 MG tablet Take 1 tablet (4 mg total) by mouth every 8 (eight) hours as needed for nausea or vomiting. Patient not taking: Reported on 09/22/2016 04/28/15   Shuford, Olivia Mackie, PA-C  oxyCODONE-acetaminophen (PERCOCET) 5-325 MG tablet Take 1-2 tablets by mouth every 4 (four) hours as needed. Patient not taking: Reported on 09/22/2016 04/28/15   Jenetta Loges, PA-C    Physical Exam:   Constitutional: NAD, calm, comfortable Vitals:   09/22/16 2045 09/22/16 2100 09/22/16 2115 09/22/16 2130  BP: (!) 158/81 (!) 157/80 (!) 173/81 137/67  Pulse: 85 76 72 73  Resp: 18 10 14 17   Temp:      SpO2: 99% 100% 99% 97%   Eyes: PERRL, lids and conjunctivae normal ENMT: Mucous membranes are moist. Posterior pharynx clear of any exudate or lesions. Normal dentition.  Neck: normal, supple, no masses, no thyromegaly Respiratory: clear to auscultation bilaterally, no wheezing, no crackles. Normal respiratory effort. No accessory muscle use.  Cardiovascular: Regular rate and rhythm, no murmurs / rubs / gallops. No extremity edema. 2+ pedal pulses. No carotid bruits.  Abdomen: no tenderness, no masses palpated. No hepatosplenomegaly. Bowel sounds positive.  Musculoskeletal: no clubbing / cyanosis. No joint deformity upper and lower extremities. Good ROM, no contractures. Normal muscle tone.  Skin: no rashes, lesions, ulcers. No induration Neurologic: CN 2-12 grossly intact. Sensation abnormal, DTR normal. Strength 5/5 in all 4.  Psychiatric: Normal judgment and insight. Alert and oriented x 3. Normal mood.     Labs on  Admission: I have personally reviewed following labs and imaging studies  CBC:  Recent Labs Lab 09/22/16 1939 09/22/16 1948  WBC 12.3*  --   NEUTROABS 7.2  --   HGB 14.7 14.6  HCT 42.7 43.0  MCV 97.0  --   PLT 285  --    Basic Metabolic Panel:  Recent Labs Lab 09/22/16 1939 09/22/16 1948  NA 136 138  K 3.5 3.6  CL 103 103  CO2 21*  --   GLUCOSE 95 90  BUN 11 15  CREATININE 0.62 0.70  CALCIUM 9.2  --    GFR: CrCl cannot be calculated (Unknown ideal weight.). Liver Function Tests:  Recent Labs Lab 09/22/16 1939  AST 17  ALT 20  ALKPHOS 69  BILITOT 0.6  PROT 6.4*  ALBUMIN 4.0   No results for input(s): LIPASE, AMYLASE in the last 168 hours. No results for input(s): AMMONIA in the last 168 hours. Coagulation Profile:  Recent Labs Lab 09/22/16 1939  INR 0.91   Cardiac Enzymes:  No results for input(s): CKTOTAL, CKMB, CKMBINDEX, TROPONINI in the last 168 hours. BNP (last 3 results) No results for input(s): PROBNP in the last 8760 hours. HbA1C: No results for input(s): HGBA1C in the last 72 hours. CBG:  Recent Labs Lab 09/22/16 1939  GLUCAP 89   Lipid Profile: No results for input(s): CHOL, HDL, LDLCALC, TRIG, CHOLHDL, LDLDIRECT in the last 72 hours. Thyroid Function Tests: No results for input(s): TSH, T4TOTAL, FREET4, T3FREE, THYROIDAB in the last 72 hours. Anemia Panel: No results for input(s): VITAMINB12, FOLATE, FERRITIN, TIBC, IRON, RETICCTPCT in the last 72 hours. Urine analysis:    Component Value Date/Time   COLORURINE STRAW (A) 09/22/2016 2020   APPEARANCEUR CLEAR 09/22/2016 2020   LABSPEC 1.002 (L) 09/22/2016 2020   PHURINE 6.0 09/22/2016 2020   GLUCOSEU NEGATIVE 09/22/2016 2020   HGBUR SMALL (A) 09/22/2016 2020   BILIRUBINUR NEGATIVE 09/22/2016 2020   KETONESUR NEGATIVE 09/22/2016 2020   PROTEINUR NEGATIVE 09/22/2016 2020   NITRITE NEGATIVE 09/22/2016 2020   LEUKOCYTESUR NEGATIVE 09/22/2016 2020   Sepsis Labs: No results  found for this or any previous visit (from the past 240 hour(s)).   Radiological Exams on Admission: Ct Head Code Stroke W/o Cm  Result Date: 09/22/2016 CLINICAL DATA:  Code stroke. Acute onset of left hand tingling and numbness. Last seen normal 4 hours ago. EXAM: CT HEAD WITHOUT CONTRAST TECHNIQUE: Contiguous axial images were obtained from the base of the skull through the vertex without intravenous contrast. COMPARISON:  CT head without contrast 06/11/2014 FINDINGS: Brain: No acute infarct, hemorrhage, or mass lesion is present. The ventricles are of normal size. No significant extraaxial fluid collection is present. Vascular: No hyperdense vessel or unexpected calcification. Skull: The calvarium is intact. No focal lytic or blastic lesions are present. Sinuses/Orbits: Right maxillary mucosal disease appears chronic. There is mucosal thickening in the anterior ethmoid air cells and inferior left frontal sinus. Mild mucosal thickening is present in the left sphenoid sinus. ASPECTS [facility] Stroke Program Early CT Score) - Ganglionic level infarction (caudate, lentiform nuclei, internal capsule, insula, M1-M3 cortex): 7/7 - Supraganglionic infarction (M4-M6 cortex): 3/3 Total score (0-10 with 10 being normal): 10/10 IMPRESSION: 1. Normal CT appearance of the brain. 2. Mild diffuse sinus disease. 3. ASPECTS is 10/10 These results were text paged at the time of interpretation on 09/22/2016 at 7:55 pm to Dr. Leonel Ramsay . Electronically Signed   By: San Morelle M.D.   On: 09/22/2016 19:55    EKG: Independently reviewed. Sinus rhythm with anterior Q waves.  Assessment/Plan Paresthesias/ suspected CVA: Acute. Patient presents with numbness of the lips and left hand. Initial CT imaging negative for any signs of acute stroke. Neurology evaluated the patient have concern for possible ischemic stroke. - Admit to telemetry bed - Stroke order set initiated - Neuro checks - Check MRI Brain, if able to be  obtained given history of broken collarbone - Check echocardiogram - PT/OT/Speech to eval and treat - Check Hemoglobin A1c and lipid panel in a.m. - ASA - Appreciate neurology consultative services, will follow-up for further recommendations  Gait disturbance - As seen above  Leukocytosis WBC elevated at 12.3 on admission. Patient denies having any specific complaints. CT scan of the brain that shows mild sinus disease as a possible cause of patient's elevated white count. - Recheck CBC in a.m.  Essential hypertension -  Restart losartan, amlodipine,and metoprolol tomorrow morning  Hyperlipidemia - Continue Crestor  Tobacco abuse: Patient admits to smoking approximately 1.5 packs of  cigarettes per day on average. - Counseled on the need of cessation of tobacco use - Nicotine patch in a.m  DVT prophylaxis:Lovenox  Code Status: Full Family Communication: Discussed wound care with the patient will present at bedside Disposition Plan: Likely discharge home Consults called: Neurology  Admission status: Observation  Norval Morton MD [facility] Pager [phone]  If 7PM-7AM, please contact night-coverage [email or web address] Password [email or web address]  09/22/2016, 9:45 PM

## 2016-09-22 NOTE — Consult Note (Signed)
Neurology Consultation Reason for Consult: Left-sided weakness and numbness Referring Physician: Jeanell Sparrow, D  CC: Left-sided weakness and numbness  History is obtained from: Patient  HPI: Rebecca Gordon is a 61 y.o. female with a history of hypertension who presents with left-sided weakness and numbness started around 3:30 PM. She states that she was going into a drug store at the time, and noticed that she felt numb on her left hand. She then got dizzy and then subsequently became unsteady. When this continued, she eventually called 911 and was brought into the emergency department shortly before the end of the 4-1/2 hour IV tPA window.  Her symptoms were relatively mild and after discussing the risks and benefits of IV TPA with her, I made the recommendation to not proceed IV TPA but did discuss with her and offer it but after hearing the risks and benefits she agreed to not pursue it.   LKW: 3:30 PM tpa given?: no, mild symptoms    ROS: A 14 point ROS was performed and is negative except as noted in the HPI.   Past Medical History:  Diagnosis Date  . Anxiety   . Arthritis    "right knee"  . Depression   . GERD (gastroesophageal reflux disease)    Nexium  . High cholesterol   . History of bronchitis   . History of pneumonia   . Hypertension   . Panic attacks      History reviewed. No pertinent family history.   Social History: She is a current smoker, but indicates that she is willing to quit.   Exam: Current vital signs: BP 138/67   Pulse 75   Temp 98.9 F (37.2 C)   Resp 13   SpO2 97%  Vital signs in last 24 hours: Temp:  [98.9 F (37.2 C)] 98.9 F (37.2 C) (06/09 2031) Pulse Rate:  [72-85] 75 (06/09 2230) Resp:  [9-18] 13 (06/09 2230) BP: (134-179)/(60-86) 138/67 (06/09 2230) SpO2:  [95 %-100 %] 97 % (06/09 2230)   Physical Exam  Constitutional: Appears well-developed and well-nourished.  Psych: Affect appropriate to situation Eyes: No scleral  injection HENT: No OP obstrucion Head: Normocephalic.  Cardiovascular: Normal rate and regular rhythm.  Respiratory: Effort normal and breath sounds normal to anterior ascultation GI: Soft.  No distension. There is no tenderness.  Skin: WDI  Neuro: Mental Status: Patient is awake, alert, oriented to person, place, month, year, and situation. Patient is able to give a clear and coherent history. No signs of aphasia or neglect Cranial Nerves: II: Visual Fields are full. Pupils are equal, round, and reactive to light.   III,IV, VI: EOMI without ptosis or diploplia.  V: Facial sensation is decreased on the left VII: Facial movement is symmetric.  VIII: hearing is intact to voice X: Uvula elevates symmetrically XI: Shoulder shrug is symmetric. XII: tongue is midline without atrophy or fasciculations.  Motor: Tone is normal. Bulk is normal. 5/5 strength was present throughout the right side, she has minimal left arm weakness with some impairment of fine motor coordination that seems out of proportion to weakness, 4/5 weakness of the left leg. Sensory: Sensation is diminished in the left arm, intact in the left leg  Cerebellar: Finger nose finger and heel-knee-shin are intact on the right, she has difficulty on the left with both of these this seems out of proportion to weakness.  I have reviewed labs in epic and the results pertinent to this consultation are: CMP-unremarkable  I have reviewed  the images obtained: CT head-unremarkable  Impression: 61 year old female with likely small subcortical infarct causing mild left hemiparesis. She will need to be admitted for further stroke workup and therapy.  Recommendations: 1. HgbA1c, fasting lipid panel 2. MRI, MRA  of the brain without contrast 3. Frequent neuro checks 4. Echocardiogram 5. Carotid dopplers 6. Prophylactic therapy-Antiplatelet med: Aspirin - dose 325mg  PO or 300mg  PR 7. Risk factor modification 8. Telemetry  monitoring 9. PT consult, OT consult, Speech consult 10. please page stroke NP  Or  PA  Or MD  from 8am -4 pm as this patient will be followed by the stroke team at this point.   You can look them up on [email or web address]      Roland Rack, MD [facility] [phone]  If 7pm- 7am, please page neurology on call as listed in [place].

## 2016-09-22 NOTE — ED Triage Notes (Signed)
Per EMS: LSN 6381. Pt with L hand and facial numbness. Pt with unsteady gait upon arrival. Pt states stumbled prior to arrival, some difficulty walking upon arrival. Pt a/o x 4 upon arrival. VSS. Neuro MD at bedside.

## 2016-09-22 NOTE — ED Provider Notes (Addendum)
[facility] DEPT Provider Note   CSN: [id number] Arrival date & time: 09/22/16  1937     History   Chief Complaint No chief complaint on file.   HPI Rebecca Gordon is a 61 y.o. female.  HPI 61 year old female presents today with last known normal approximately 3:30 when she began having some left-sided facial numbness with left arm numbness. She has had some difficulty speaking. She denies any definitive weakness. She has a history of hypertension. EMS reports that her systolic blood pressure was 200 upon arrival. She has received no prehospital treatment. She denies Headache. She has not had any recent injuries. Past Medical History:  Diagnosis Date  . Anxiety   . Arthritis    "right knee"  . Depression   . GERD (gastroesophageal reflux disease)    Nexium  . High cholesterol   . History of bronchitis   . History of pneumonia   . Hypertension   . Panic attacks     Patient Active Problem List   Diagnosis Date Noted  . SINUSITIS- ACUTE-NOS 05/12/2008    Past Surgical History:  Procedure Laterality Date  . CLAVICLE SURGERY Right   . COLONOSCOPY    . EYE SURGERY Right    "growth on eye removed"  . ORIF CLAVICULAR FRACTURE Right 04/28/2015   Procedure: REVISION ORIF RIGHT CLAVICAL FRACTURE, ALLOGRAFT BONE GRAFTING;  Surgeon: Justice Britain, MD;  Location: [facility];  Service: Orthopedics;  Laterality: Right;  . PLACEMENT OF BREAST IMPLANTS Bilateral   . TONSILLECTOMY      OB History    No data available       Home Medications    Prior to Admission medications   Medication Sig Start Date End Date Taking? Authorizing Provider  Calcium Carbonate-Vit D-Min (CALCIUM 1200 PO) Take 1 tablet by mouth daily.    [provider]  cholecalciferol (VITAMIN D) 1000 units tablet Take 1,000 Units by mouth 2 (two) times daily.    [provider]  dextromethorphan-guaiFENesin (MUCINEX DM) 30-600 MG 12hr tablet Take 1 tablet by mouth 2 (two) times daily.    [provider]  esomeprazole (NEXIUM) 20 MG capsule Take 20 mg by mouth daily at 12 noon.    [provider]  Fluticasone-Salmeterol (ADVAIR) 100-50 MCG/DOSE AEPB Inhale 1 puff into the lungs 2 (two) times daily. Started 04/20/15;    [provider]  losartan (COZAAR) 100 MG tablet Take 100 mg by mouth daily.    [provider]  methocarbamol (ROBAXIN) 500 MG tablet Take 1 tablet (500 mg total) by mouth every 8 (eight) hours as needed for muscle spasms. 04/28/15   Shuford, Olivia Mackie, PA-C  metoprolol succinate (TOPROL-XL) 25 MG 24 hr tablet Take 25 mg by mouth daily.    [provider]  Multiple Vitamins-Minerals (HAIR/SKIN/NAILS PO) Take 2,500 mcg by mouth daily.    [provider]  Omega 3 1200 MG CAPS Take 1,200 mg by mouth 2 (two) times daily.    [provider]  ondansetron (ZOFRAN) 4 MG tablet Take 1 tablet (4 mg total) by mouth every 8 (eight) hours as needed for nausea or vomiting. 04/28/15   Shuford, Olivia Mackie, PA-C  OVER THE COUNTER MEDICATION Take 538 mg by mouth 2 (two) times daily. OTC. Tumeric    [provider]  oxyCODONE-acetaminophen (PERCOCET) 5-325 MG tablet Take 1-2 tablets by mouth every 4 (four) hours as needed. 04/28/15   Shuford, Olivia Mackie, PA-C  Probiotic Product (ALIGN) 4 MG CAPS Take 1 capsule  by mouth daily.    [provider]  rosuvastatin (CRESTOR) 20 MG tablet Take 20 mg by mouth daily.    [provider]  sertraline (ZOLOFT) 50 MG tablet Take 50 mg by mouth daily.    [provider]    Family History No family history on file.  Social History Social History  Substance Use Topics  . Smoking status: Former Smoker    Quit date: 04/17/2015  . Smokeless tobacco: Not on file     Comment: Previous 1 pack a day  . Alcohol use Yes     Comment: daily - 1 bottle of wine     Allergies   Patient has no known allergies.   Review of Systems Review of Systems  All other systems reviewed and are  negative.    Physical Exam Updated Vital Signs There were no vitals taken for this visit.  Physical Exam  Constitutional: She is oriented to person, place, and time. She appears well-developed and well-nourished.  HENT:  Head: Normocephalic.  Right Ear: External ear normal.  Left Ear: External ear normal.  Mouth/Throat: Oropharynx is clear and moist.  Eyes: Pupils are equal, round, and reactive to light.  Neck: Normal range of motion.  Cardiovascular: Normal rate.   Pulmonary/Chest: Effort normal.  Abdominal: Soft.  Musculoskeletal: Normal range of motion.  Neurological: She is alert and oriented to person, place, and time. She displays normal reflexes. No cranial nerve deficit or sensory deficit. She exhibits normal muscle tone. Coordination normal.  Facial asymmetry noted with mild aphasia  Skin: Skin is warm. Capillary refill takes less than 2 seconds.  Psychiatric: She has a normal mood and affect.  Nursing note and vitals reviewed.    ED Treatments / Results  Labs (all labs ordered are listed, but only abnormal results are displayed) Labs Reviewed  PROTIME-INR  APTT  CBC  DIFFERENTIAL  COMPREHENSIVE METABOLIC PANEL  I-STAT [place], ED  CBG MONITORING, ED  I-STAT CHEM 8, ED    EKG  EKG Interpretation None       Radiology No results found.  Procedures Procedures (including critical care time)  Medications Ordered in ED Medications - No data to display   Initial Impression / Assessment and Plan / ED Course  I have reviewed the triage vital signs and the nursing notes.  Pertinent labs & imaging results that were available during my care of the patient were reviewed by me and considered in my medical decision making (see chart for details).   met at [facility] as code stroke. Dr. Leonel Ramsay present and patient proceeding to CT scan. She was activated as code stroke.  Dr. Leonel Ramsay saw and evaluated patient. He feels is likely patient has had stroke.  She is not receiving TPA. Should he is to be admitted to the hospitalist service. Discussed with Dr. Tamala Julian and he will admit. Final Clinical Impressions(s) / ED Diagnoses   Final diagnoses:  Cerebrovascular accident (CVA), unspecified mechanism ([place])    New Prescriptions New Prescriptions   No medications on file     Pattricia Boss, MD 09/22/16 2055    Pattricia Boss, MD 09/22/16 2244

## 2016-09-23 ENCOUNTER — Encounter (HOSPITAL_COMMUNITY): Payer: Self-pay | Admitting: Radiology

## 2016-09-23 ENCOUNTER — Observation Stay (HOSPITAL_COMMUNITY): Payer: Managed Care, Other (non HMO)

## 2016-09-23 ENCOUNTER — Observation Stay (HOSPITAL_BASED_OUTPATIENT_CLINIC_OR_DEPARTMENT_OTHER): Payer: Managed Care, Other (non HMO)

## 2016-09-23 DIAGNOSIS — I517 Cardiomegaly: Secondary | ICD-10-CM

## 2016-09-23 DIAGNOSIS — R202 Paresthesia of skin: Secondary | ICD-10-CM

## 2016-09-23 DIAGNOSIS — I10 Essential (primary) hypertension: Secondary | ICD-10-CM | POA: Diagnosis not present

## 2016-09-23 DIAGNOSIS — D72829 Elevated white blood cell count, unspecified: Secondary | ICD-10-CM | POA: Diagnosis present

## 2016-09-23 DIAGNOSIS — Z87891 Personal history of nicotine dependence: Secondary | ICD-10-CM | POA: Diagnosis present

## 2016-09-23 DIAGNOSIS — E785 Hyperlipidemia, unspecified: Secondary | ICD-10-CM | POA: Diagnosis not present

## 2016-09-23 DIAGNOSIS — R269 Unspecified abnormalities of gait and mobility: Secondary | ICD-10-CM | POA: Diagnosis not present

## 2016-09-23 DIAGNOSIS — G459 Transient cerebral ischemic attack, unspecified: Secondary | ICD-10-CM

## 2016-09-23 DIAGNOSIS — Z720 Tobacco use: Secondary | ICD-10-CM | POA: Diagnosis not present

## 2016-09-23 HISTORY — DX: Cardiomegaly: I51.7

## 2016-09-23 HISTORY — DX: Personal history of nicotine dependence: Z87.891

## 2016-09-23 LAB — BASIC METABOLIC PANEL
ANION GAP: 8 (ref 5–15)
BUN: 13 mg/dL (ref 6–20)
CALCIUM: 8.7 mg/dL — AB (ref 8.9–10.3)
CHLORIDE: 104 mmol/L (ref 101–111)
CO2: 26 mmol/L (ref 22–32)
CREATININE: 0.52 mg/dL (ref 0.44–1.00)
GFR calc Af Amer: >60 mL/min (ref >60)
GFR calc non Af Amer: >60 mL/min (ref >60)
Glucose, Bld: 91 mg/dL (ref 65–99)
Potassium: 3.6 mmol/L (ref 3.5–5.1)
Sodium: 138 mmol/L (ref 135–145)

## 2016-09-23 LAB — CBC WITH DIFFERENTIAL/PLATELET
Basophils Absolute: 0.1 10*3/uL (ref 0.0–0.1)
Basophils Relative: 1 %
Eosinophils Absolute: 0.4 10*3/uL (ref 0.0–0.7)
Eosinophils Relative: 4 %
HEMATOCRIT: 41.3 % (ref 36.0–46.0)
HEMOGLOBIN: 13.7 g/dL (ref 12.0–15.0)
LYMPHS ABS: 3.4 10*3/uL (ref 0.7–4.0)
LYMPHS PCT: 35 %
MCH: 32.5 pg (ref 26.0–34.0)
MCHC: 33.2 g/dL (ref 30.0–36.0)
MCV: 98.1 fL (ref 78.0–100.0)
MONO ABS: 0.9 10*3/uL (ref 0.1–1.0)
MONOS PCT: 9 %
NEUTROS ABS: 4.8 10*3/uL (ref 1.7–7.7)
Neutrophils Relative %: 51 %
Platelets: 279 10*3/uL (ref 150–400)
RBC: 4.21 MIL/uL (ref 3.87–5.11)
RDW: 13.3 % (ref 11.5–15.5)
WBC: 9.5 10*3/uL (ref 4.0–10.5)

## 2016-09-23 LAB — LIPID PANEL
CHOL/HDL RATIO: 3.1 ratio
CHOLESTEROL: 181 mg/dL (ref 0–200)
HDL: 59 mg/dL (ref >40)
LDL Cholesterol: 88 mg/dL (ref 0–99)
TRIGLYCERIDES: 170 mg/dL — AB (ref <150)
VLDL: 34 mg/dL (ref 0–40)

## 2016-09-23 LAB — HIV ANTIBODY (ROUTINE TESTING W REFLEX): HIV Screen 4th Generation wRfx: NONREACTIVE

## 2016-09-23 LAB — ECHOCARDIOGRAM COMPLETE
Height: 64 in
Weight: 2241.6 oz

## 2016-09-23 MED ORDER — IOPAMIDOL (ISOVUE-370) INJECTION 76%
INTRAVENOUS | Status: AC
  Administered 2016-09-23: 50 mL
  Filled 2016-09-23: qty 50

## 2016-09-23 MED ORDER — WHITE PETROLATUM GEL
Status: AC
  Administered 2016-09-23: 06:00:00
  Filled 2016-09-23: qty 1

## 2016-09-23 MED ORDER — ASPIRIN 325 MG PO TABS
325.0000 mg | ORAL_TABLET | Freq: Every day | ORAL | Status: DC
  Administered 2016-09-23 – 2016-09-24 (×2): 325 mg via ORAL
  Filled 2016-09-23 (×2): qty 1

## 2016-09-23 MED ORDER — ROSUVASTATIN CALCIUM 5 MG PO TABS
40.0000 mg | ORAL_TABLET | Freq: Every day | ORAL | Status: DC
  Administered 2016-09-23: 40 mg via ORAL
  Filled 2016-09-23: qty 8

## 2016-09-23 NOTE — Progress Notes (Addendum)
OT Cancellation Note  Patient Details Name: [name] MRN: [id number] DOB: 1955-08-24   Cancelled Treatment:    Reason Eval/Treat Not Completed: OT screened, no needs identified, will sign off. Pt with near resolved symptoms. Reports "maybe 2%" deficit in [facility] of L hand. Encouraged to continue with [facility] activities involving LUE to fully complete gains in this area.  Tyrone Schimke OTR/L Pager: [phone]   09/23/2016, 2:36 PM

## 2016-09-23 NOTE — Progress Notes (Signed)
Patient in room this AM, reports headache, level 2 on a 0-10 scale, she requested for aspirin and received per order. 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 She states that she feels "everything happens for a reason". Patient is encouraged to rest and be kind to herself. Will continue to monitor

## 2016-09-23 NOTE — Progress Notes (Signed)
PT Cancellation Note  Patient Details Name: ABBY TUCHOLSKI MRN: [id number] DOB: Apr 30, 1955   Cancelled Treatment:    Reason Eval/Treat Not Completed: PT screened, no needs identified, will sign off.  PT denies any changes from baseline not explained by prolonged bedrest, feels confident on feet without concern for safety or balance.  Advised to report changes to medical team and reconsult PT if needed.  Thank you,   Herbie Drape 09/23/2016, 1:05 PM

## 2016-09-23 NOTE — Progress Notes (Signed)
STROKE TEAM PROGRESS NOTE   HISTORY OF PRESENT ILLNESS (per record) Rebecca Gordon is a 61 y.o. female with a history of hypertension who presents with left-sided weakness and numbness started around 3:30 PM. She states that she was going into a drug store at the time, and noticed that she felt numb on her left hand. She then got dizzy and then subsequently became unsteady. When this continued, she eventually called 911 and was brought into the emergency department shortly before the end of the 4-1/2 hour IV tPA window.  Her symptoms were relatively mild and after discussing the risks and benefits of IV TPA with her, I made the recommendation to not proceed IV TPA but did discuss with her and offer it but after hearing the risks and benefits she agreed to not pursue it.   LKW: 3:30 PM tpa given?: no, mild symptoms   SUBJECTIVE (INTERVAL HISTORY) The patient's husband is at the bedside. The patient reported difficulty walking prior to admission apparently she had one fall but reported no significant injury. She also felt presyncopal at times.Shehas h/o anxeity and panic attacks   OBJECTIVE Temp:  [98.5 F (36.9 C)-98.9 F (37.2 C)] 98.6 F (37 C) (06/10 0959) Pulse Rate:  [66-85] 72 (06/10 0959) Cardiac Rhythm: Normal sinus rhythm (06/10 0700) Resp:  [9-20] 18 (06/10 0959) BP: (101-179)/(50-86) 161/71 (06/10 0959) SpO2:  [95 %-100 %] 99 % (06/10 0959) Weight:  [63.5 kg (140 lb 1.6 oz)] 63.5 kg (140 lb 1.6 oz) (06/10 0006)  CBC:   Recent Labs Lab 09/22/16 1939 09/22/16 1948 09/23/16 0312  WBC 12.3*  --  9.5  NEUTROABS 7.2  --  4.8  HGB 14.7 14.6 13.7  HCT 42.7 43.0 41.3  MCV 97.0  --  98.1  PLT 285  --  662    Basic Metabolic Panel:   Recent Labs Lab 09/22/16 1939 09/22/16 1948 09/23/16 0312  NA 136 138 138  K 3.5 3.6 3.6  CL 103 103 104  CO2 21*  --  26  GLUCOSE 95 90 91  BUN 11 15 13   CREATININE 0.62 0.70 0.52  CALCIUM 9.2  --  8.7*  MG 2.0  --   --      Lipid Panel:     Component Value Date/Time   CHOL 181 09/23/2016 0312   TRIG 170 (H) 09/23/2016 0312   HDL 59 09/23/2016 0312   CHOLHDL 3.1 09/23/2016 0312   VLDL 34 09/23/2016 0312   LDLCALC 88 09/23/2016 0312   HgbA1c: No results found for: HGBA1C Urine Drug Screen:     Component Value Date/Time   LABOPIA NONE DETECTED 09/22/2016 2020   COCAINSCRNUR NONE DETECTED 09/22/2016 2020   LABBENZ NONE DETECTED 09/22/2016 2020   AMPHETMU NONE DETECTED 09/22/2016 2020   THCU NONE DETECTED 09/22/2016 2020   LABBARB NONE DETECTED 09/22/2016 2020    Alcohol Level No results found for: [facility]  IMAGING  Mr Brain Wo Contrast 09/23/2016 1. No acute intracranial infarct or other process identified.  2. Mild T2/FLAIR hyperintense foci involving the supratentorial cerebral white matter, nonspecific, but most likely related to mild chronic small vessel ischemic disease.  3. Moderate allergic/inflammatory paranasal sinus disease.      Ct Head Code Stroke W/o Cm 09/22/2016 1. Normal CT appearance of the brain.  2. Mild diffuse sinus disease.  3. ASPECTS is 10/10     CTA Head and Neck 09/23/2016 1. 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 2. No emerging  large vessel occlusion or significant stenosis. 3. Mild atherosclerotic changes at the carotid bifurcations bilaterally without a significant stenosis. 4. Marked tortuosity of the cervical right internal carotid artery and to lesser extent the left internal carotid artery and bilateral common carotid arteries. This is nonspecific, but most commonly seen in the setting of chronic hypertension.    PHYSICAL EXAM Pleasant middle aged lady not in distress. . Afebrile. Head is nontraumatic. Neck is supple without bruit.    Cardiac exam no murmur or gallop. Lungs are clear to auscultation. Distal pulses are well felt.  Neurological Exam ;  Awake  Alert oriented x 3. Normal speech and language.eye movements full without nystagmus.fundi were  not visualized. Vision acuity and fields appear normal. Hearing is normal. Palatal movements are normal. Face symmetric. Tongue midline. Normal strength, tone, reflexes and coordination. Normal sensation. Gait deferred.    ASSESSMENT/PLAN [name] is a 61 y.o. female with history of hypertension, current smoker, hyperlipidemia, bronchitis, anxiety, depression, and panic attacks presenting with left sided weakness, numbness, unsteadiness, and dizziness.  She did not receive IV t-PA due to mild deficits.  Possible TIA vs panic atack vs seizure:    Resultant  No deficts  CT head - normal  MRI head - no acute process identified.  MRA head - not performed.  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  EEG - pending  Carotid Doppler - Bilateral 1-39% ICA stenosis, antegrade vertebral flow.  2D Echo - pending  LDL - 88  HgbA1c - pending  VTE prophylaxis - Lovenox Diet Heart Room service appropriate? Yes; Fluid consistency: Thin  No antithrombotic prior to admission, now on aspirin 325 mg daily  Patient counseled to be compliant with her antithrombotic medications  Ongoing aggressive stroke risk factor management  Therapy recommendations:  No needs identified.  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  Hospital day # 0  Mikey Bussing PA-C [facility] Pager [phone] 09/23/2016, 4:11 PM I have personally examined this patient, reviewed notes, independently viewed imaging studies, participated in medical decision making and plan of care.ROS completed by me personally and pertinent positives fully documented  I have made any additions or clarifications directly to the above note. Agree with note above.  She presented with transient episode of perioral and left-sided numbness in  Presyncopal symptoms and dizziness etiology unclear TIA versus anxiety/stress versus complex partial seizure. Recommend check EEG and ongoing stroke workup. Long discussion with patient and husband and answered questions. Greater than 50 % time during this 35 minute visit was spent on counselling and coordination  of care about her episode. Discussing differential diagnosis and answering questions about work up and treatment.  Antony Contras, MD Medical Director [facility] Stroke Center Pager: [phone] 09/23/2016 5:15 PM   To contact Stroke Continuity provider, please refer to [email or web address] After hours, contact  General Neurology

## 2016-09-23 NOTE — Progress Notes (Signed)
Patient admitted from ER. Patient alert and oriented x 4. Patient oriented to room and made comfortable. Tele placed and was verified.

## 2016-09-23 NOTE — Progress Notes (Signed)
Echocardiogram 2D Echocardiogram has been performed.  Rebecca Gordon 09/23/2016, 3:40 PM

## 2016-09-23 NOTE — Progress Notes (Signed)
SLP Cancellation Note  Patient Details Name: Rebecca Gordon MRN: [id number] DOB: 04/28/1955   Cancelled treatment:       Reason Eval/Treat Not Completed: SLP screened, no needs identified, will sign off. Imaging negative, pt reports she has returned to baseline.   Deneise Lever, [place], [place] Speech-Language Pathologist [phone]   Aliene Altes 09/23/2016, 4:18 PM

## 2016-09-23 NOTE — Progress Notes (Signed)
PROGRESS NOTE    NAWAL BURLING  YFV:[id number]  DOB: 24-Feb-1956  DOA: 09/22/2016 PCP: [name], DO   Brief Admission Hx: Rebecca Gordon is a 61 y.o. female with a history of hypertension who presents with left-sided weakness and numbness started around 3:30 PM. She states that she was going into a drug store at the time, and noticed that she felt numb on her left hand. She then got dizzy and then subsequently became unsteady. When this continued, she eventually called 911 and was brought into the emergency department shortly before the end of the 4-1/2 hour IV tPA window.  MDM/Assessment & Plan:   Paresthesias with concern for TIA.  Symptoms improving to nearly completely resolved now.   Patient presented with numbness of the lips and left hand. Initial CT imaging negative for any signs of acute stroke. MRI neg for acute findings.  Neurology consulted. Stroke work up pending. - Admitted to telemetry bed - Stroke order set initiated - Neuro checks - Check echocardiogram - PT/OT/Speech to eval and treat - Check Hemoglobin A1c and lipid panel in a.m. - ASA - Appreciate neurology consultative services, will follow-up for further recommendations  Gait disturbance - As seen above  Leukocytosis WBC elevated at 12.3 on admission. Resolved now.  Patient denies having any specific complaints. CT scan of the brain that shows mild sinus disease as a possible cause of patient's elevated white count.  Essential hypertension -  Restarted home meds  Hyperlipidemia - Continue Crestor 20 mg daily, LDL 88  Tobacco abuse: Patient admits to smoking approximately 1.5 packs of cigarettes per day on average. - Counseled on the need of cessation of tobacco use - Nicotine patch in a.m  DVT prophylaxis:Lovenox  Code Status: Full Family Communication: Discussed wound care with the patient will present at bedside Disposition Plan: Likely discharge home Consults called: Neurology    Subjective: Pt says that her symptoms are mostly completely resolved now.   Objective: Vitals:   09/23/16 0400 09/23/16 0558 09/23/16 0800 09/23/16 0959  BP: (!) 131/57 (!) 157/71 (!) 149/57 (!) 161/71  Pulse: 66 73 83 72  Resp: 14 15 20 18   Temp:    98.6 F (37 C)  TempSrc:      SpO2: 95% 98% 99% 99%  Weight:      Height:        Intake/Output Summary (Last 24 hours) at 09/23/16 1132 Last data filed at 09/23/16 0044  Gross per 24 hour  Intake             1000 ml  Output                0 ml  Net             1000 ml   Filed Weights   09/23/16 0006  Weight: 63.5 kg (140 lb 1.6 oz)     REVIEW OF SYSTEMS  As per history otherwise all reviewed and reported negative  Exam:  General exam: awake, alert. NAD. Cooperative.  Respiratory system: Clear. No increased work of breathing. Cardiovascular system: S1 & S2 heard, RRR. No JVD, murmurs, gallops, clicks or pedal edema. Gastrointestinal system: Abdomen is nondistended, soft and nontender. Normal bowel sounds heard. Central nervous system: Alert and oriented. No focal neurological deficits. Extremities: no CCE.  Data Reviewed: Basic Metabolic Panel:  Recent Labs Lab 09/22/16 1939 09/22/16 1948 09/23/16 0312  NA 136 138 138  K 3.5 3.6 3.6  CL 103 103  104  CO2 21*  --  26  GLUCOSE 95 90 91  BUN 11 15 13   CREATININE 0.62 0.70 0.52  CALCIUM 9.2  --  8.7*  MG 2.0  --   --    Liver Function Tests:  Recent Labs Lab 09/22/16 1939  AST 17  ALT 20  ALKPHOS 69  BILITOT 0.6  PROT 6.4*  ALBUMIN 4.0   No results for input(s): LIPASE, AMYLASE in the last 168 hours. No results for input(s): AMMONIA in the last 168 hours. CBC:  Recent Labs Lab 09/22/16 1939 09/22/16 1948 09/23/16 0312  WBC 12.3*  --  9.5  NEUTROABS 7.2  --  4.8  HGB 14.7 14.6 13.7  HCT 42.7 43.0 41.3  MCV 97.0  --  98.1  PLT 285  --  279   Cardiac Enzymes: No results for input(s): CKTOTAL, CKMB, CKMBINDEX, TROPONINI in the last 168  hours. CBG (last 3)   Recent Labs  09/22/16 1939  GLUCAP 89   No results found for this or any previous visit (from the past 240 hour(s)).   Studies: Mr Brain Wo Contrast  Result Date: 09/23/2016 CLINICAL DATA:  Initial evaluation for acute left-sided facial numbness with left arm numbness. Difficulty speaking. EXAM: MRI HEAD WITHOUT CONTRAST TECHNIQUE: Multiplanar, multiecho pulse sequences of the brain and surrounding structures were obtained without intravenous contrast. COMPARISON:  Comparison made with prior CT from earlier same day. FINDINGS: Brain: Cerebral volume within normal limits for age. Scattered patchy T2/FLAIR hyperintense foci noted within the periventricular, deep, and subcortical white matter both cerebral hemispheres, nonspecific, but most likely related chronic small vessel ischemic disease, mild for age. No abnormal foci of restricted diffusion to suggest acute or subacute ischemia. Gray-white matter differentiation maintained. No areas of chronic infarction identified. No evidence for acute or chronic intracranial hemorrhage. No mass lesion, midline shift, or mass effect. Ventricles normal size without evidence for hydrocephalus. No extra-axial fluid collection. Major dural sinuses are grossly patent. Pituitary gland suprasellar region normal. Midline structures intact and normal. Vascular: Major intracranial vascular flow voids are well maintained. Skull and upper cervical spine: Craniocervical junction within normal limits. Visualized upper cervical spine unremarkable. Bone marrow signal intensity normal. No scalp soft tissue abnormality. Sinuses/Orbits: Globes and orbital soft tissues within normal limits. Scattered mucosal thickening throughout the paranasal sinuses, greatest within the left sphenoid and right maxillary sinus. Superimposed retention cyst present at the right maxillary sinus. No air-fluid level to suggest active sinusitis. No mastoid effusion. Inner ear  structures normal. IMPRESSION: 1. No acute intracranial infarct or other process identified. 2. Mild T2/FLAIR hyperintense foci involving the supratentorial cerebral white matter, nonspecific, but most likely related to mild chronic small vessel ischemic disease. 3. Moderate allergic/inflammatory paranasal sinus disease.  The Electronically Signed   By: Jeannine Boga M.D.   On: 09/23/2016 01:02   Ct Head Code Stroke W/o Cm  Result Date: 09/22/2016 CLINICAL DATA:  Code stroke. Acute onset of left hand tingling and numbness. Last seen normal 4 hours ago. EXAM: CT HEAD WITHOUT CONTRAST TECHNIQUE: Contiguous axial images were obtained from the base of the skull through the vertex without intravenous contrast. COMPARISON:  CT head without contrast 06/11/2014 FINDINGS: Brain: No acute infarct, hemorrhage, or mass lesion is present. The ventricles are of normal size. No significant extraaxial fluid collection is present. Vascular: No hyperdense vessel or unexpected calcification. Skull: The calvarium is intact. No focal lytic or blastic lesions are present. Sinuses/Orbits: Right maxillary mucosal disease appears chronic.  There is mucosal thickening in the anterior ethmoid air cells and inferior left frontal sinus. Mild mucosal thickening is present in the left sphenoid sinus. ASPECTS [facility] Stroke Program Early CT Score) - Ganglionic level infarction (caudate, lentiform nuclei, internal capsule, insula, M1-M3 cortex): 7/7 - Supraganglionic infarction (M4-M6 cortex): 3/3 Total score (0-10 with 10 being normal): 10/10 IMPRESSION: 1. Normal CT appearance of the brain. 2. Mild diffuse sinus disease. 3. ASPECTS is 10/10 These results were text paged at the time of interpretation on 09/22/2016 at 7:55 pm to Dr. Leonel Ramsay . Electronically Signed   By: San Morelle M.D.   On: 09/22/2016 19:55     Scheduled Meds: . amLODipine  10 mg Oral Daily  . aspirin  325 mg Oral Daily  . enoxaparin (LOVENOX)  injection  40 mg Subcutaneous Q24H  . losartan  100 mg Oral Daily  . metoprolol succinate  50 mg Oral Daily  . nicotine  21 mg Transdermal Daily  . pantoprazole  40 mg Oral Daily  . rosuvastatin  20 mg Oral q1800  . sertraline  100 mg Oral Daily   Continuous Infusions: . sodium chloride 75 mL/hr at 09/23/16 0044    Principal Problem:   Paresthesias Active Problems:   Gait disturbance   Leukocytosis   Essential hypertension   HLD (hyperlipidemia)   Tobacco abuse   Time spent:   Irwin Brakeman, MD, FAAFP [facility] Pager [phone]  If 7PM-7AM, please contact night-coverage [email or web address] Password [email or web address] 09/23/2016, 11:32 AM    LOS: 0 days '

## 2016-09-23 NOTE — Progress Notes (Signed)
*  PRELIMINARY RESULTS* Vascular Ultrasound Carotid Duplex (Doppler) has been completed.  Preliminary findings: Bilateral 1-39% ICA stenosis, antegrade vertebral flow.   Everrett Coombe 09/23/2016, 11:41 AM

## 2016-09-24 ENCOUNTER — Observation Stay (HOSPITAL_COMMUNITY): Payer: Managed Care, Other (non HMO)

## 2016-09-24 DIAGNOSIS — I10 Essential (primary) hypertension: Secondary | ICD-10-CM | POA: Diagnosis not present

## 2016-09-24 DIAGNOSIS — E785 Hyperlipidemia, unspecified: Secondary | ICD-10-CM | POA: Diagnosis not present

## 2016-09-24 DIAGNOSIS — R202 Paresthesia of skin: Secondary | ICD-10-CM

## 2016-09-24 DIAGNOSIS — R269 Unspecified abnormalities of gait and mobility: Secondary | ICD-10-CM | POA: Diagnosis not present

## 2016-09-24 LAB — HEMOGLOBIN A1C
HEMOGLOBIN A1C: 5.5 % (ref 4.8–5.6)
MEAN PLASMA GLUCOSE: 111 mg/dL

## 2016-09-24 LAB — VAS US CAROTID
LCCADDIAS: -22 cm/s
LCCAPSYS: 104 cm/s
LEFT ECA DIAS: -12 cm/s
LEFT VERTEBRAL DIAS: -13 cm/s
LICADDIAS: -28 cm/s
LICAPSYS: 71 cm/s
Left CCA dist sys: -83 cm/s
Left CCA prox dias: 22 cm/s
Left ICA dist sys: -79 cm/s
Left ICA prox dias: 23 cm/s
RIGHT ECA DIAS: -16 cm/s
RIGHT VERTEBRAL DIAS: 14 cm/s
Right CCA prox dias: 19 cm/s
Right CCA prox sys: 100 cm/s
Right cca dist sys: -112 cm/s

## 2016-09-24 MED ORDER — HYDROCHLOROTHIAZIDE 12.5 MG PO CAPS
12.5000 mg | ORAL_CAPSULE | Freq: Every day | ORAL | Status: DC
  Administered 2016-09-24: 12.5 mg via ORAL
  Filled 2016-09-24: qty 1

## 2016-09-24 MED ORDER — HYDROCHLOROTHIAZIDE 12.5 MG PO CAPS: 12.5000 mg | ORAL_CAPSULE | Freq: Every day | ORAL | 0 refills | Status: DC

## 2016-09-24 MED ORDER — SODIUM CHLORIDE 0.9% FLUSH: 3.0000 mL | Freq: Two times a day (BID) | INTRAVENOUS | Status: DC

## 2016-09-24 MED ORDER — ROSUVASTATIN CALCIUM 40 MG PO TABS: 40.0000 mg | ORAL_TABLET | Freq: Every day | ORAL | 0 refills | Status: DC

## 2016-09-24 MED ORDER — ASPIRIN 325 MG PO TABS: 325.0000 mg | ORAL_TABLET | Freq: Every day | ORAL | Status: DC

## 2016-09-24 MED ORDER — SODIUM CHLORIDE 0.9% FLUSH: 3.0000 mL | INTRAVENOUS | Status: DC | PRN

## 2016-09-24 MED ORDER — SODIUM CHLORIDE 0.9 % IV SOLN: 250.0000 mL | INTRAVENOUS | Status: DC | PRN

## 2016-09-24 NOTE — Progress Notes (Signed)
D/c reviewed with patient and husband.

## 2016-09-24 NOTE — Discharge Summary (Signed)
Physician Discharge Summary  Rebecca Gordon DPO:[id number] DOB: 1956/04/16 DOA: 09/22/2016  PCP: [name], DO  Admit date: 09/22/2016 Discharge date: 09/24/2016  Admitted From: Home  Disposition: Home  Recommendations for Outpatient Follow-up:  1. Follow up with PCP in 1 weeks 2. Follow up with neurology clinic in 6 weeks.  3. Please obtain BMP/CBC in one week 4. Please follow up on the following pending results:  Discharge Condition: STABLE   CODE STATUS: FULL    Brief/Interim Summary: HPI: Rebecca Gordon is a 61 y.o. right hand dominant female with medical history significant of  HTN, anxiety, and tobacco abuse; who presents with complaints of numbness. Patient reports not feeling well at baseline today, but symptoms started this afternoon while walking into a retail store. Reports acute onset of numbness of her lips, tongue, and left hand. Associated symptoms include feelings of lightheadedness. Patient went home and then around 6:25 PM became acutely worse feeling disoriented and off balance for which patient fell. She did not have any significant trauma to her head or lose consciousness. She reports that it felt like it was a process to move her feet and didn't try to get back up. She called 911 as she is already looked up earlier the symptoms that she was having and there was concern for stroke. Patient also incidentally makes note of intermittent neck stiffness. Denies having any headache, change in vision, nausea, vomiting, palpitations, recent sick contacts, or recent changes in medications. The last medication change increased dose of generic Zoloft from 50-100 mg of calcium channel blocker for blood pressure over 1-2 months ago. On EMS arrival systolics blood pressure was noted to be 200. No treatment was given en route.  ED Course: Upon admission to the emergency department patient was seen to be afebrile with vital signs relatively within normal limits. Labs revealed WBC 12.3,  UDS negative, and UA wnl. CT scan the brain showed no acute signs of a stroke, but did show some signs of mild sinus disease.  Brief Admission Hx: Rebecca Gordon a 61 y.o.femalewith a history of hypertension who presents with left-sided weakness and numbness started around 3:30 PM. She states that she was going into a drug store at the time, and noticed that she felt numb on her left hand. She then got dizzy and then subsequently became unsteady. When this continued, she eventually called 911 and was brought into the emergency department shortly before the end of the 4-1/2 hour IV tPA window.  MDM/Assessment & Plan:   Paresthesias with concern for TIA vs partial complex seizure.  Symptoms improving to nearly completely resolved now.   Patient presented with numbness of the lips and left hand.Initial CT imaging negative for any signs of acute stroke. MRI neg for acute findings.  Neurology consulted. Stroke work up pending. - Admitted to telemetry bed - Stroke order set initiated - Neuro checks were ok - echocardiogram noted below - PT/OT/ no further needs identified - EEG was normal  - A1c 5.5 - ASA 325 mg daily recommended - Appreciate neurology consultative services, will follow-up outpatient with neurology and PCP for further recommendations  Gait disturbance - PT evaluated and no needs identified  Leukocytosis WBC elevated at 12.3 on admission. Resolved now.  Patient denies having any specific complaints. CT scan of the brain that shows mild sinus disease as a possible cause of patient's elevated white count.  She is not having sinus symptoms at this time, follow up with PCP.  Essential hypertension - Restarted home meds, added HCT 12.5 mg daily   Hyperlipidemia - Continue Crestor 40 mg daily, LDL 88, goal LDL less than 70  Tobacco abuse:Patient admits to smoking approximately 1.5 packs of cigarettes per day on average. - Counseled on the need of cessation of tobacco  use - Pt says she has nicotine patches at home   DVT prophylaxis:Lovenox Code Status:Full Family Communication:Discussed wound care with the patient will present at bedside Disposition Plan:Likely discharge home Consults called:Neurology  Stroke Team Note:   IMAGING  Mr Brain Wo Contrast 09/23/2016 1. No acute intracranial infarct or other process identified.  2. Mild T2/FLAIR hyperintense foci involving the supratentorial cerebral white matter, nonspecific, but most likely related to mild chronic small vessel ischemic disease.  3. Moderate allergic/inflammatory paranasal sinus disease.    Ct Head Code Stroke W/o Cm 09/22/2016 1. Normal CT appearance of the brain.  2. Mild diffuse sinus disease.  3. ASPECTS is 10/10   CTA Head and Neck 09/23/2016 1. 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 2. No emerging large vessel occlusion or significant stenosis. 3. Mild atherosclerotic changes at the carotid bifurcations bilaterally without a significant stenosis. 4. Marked tortuosity of the cervical right internal carotid artery and to lesser extent the left internal carotid artery and bilateral common carotid arteries. This is nonspecific, but most commonly seen in the setting of chronic hypertension.  PHYSICAL EXAM Pleasant middle aged lady not in distress. . Afebrile. Head is nontraumatic. Neck is supple without bruit.    Cardiac exam no murmur or gallop. Lungs are clear to auscultation. Distal pulses are well felt.  Neurological Exam ;  Awake  Alert oriented x 3. Normal speech and language.eye movements full without nystagmus.fundi were not visualized. Vision acuity and fields appear normal. Hearing is normal. Palatal movements are normal. Face symmetric. Tongue midline. Normal strength, tone, reflexes and coordination. Normal sensation. Gait deferred.  ASSESSMENT/PLAN [name] is a 61 y.o. female with history of hypertension, current smoker,  hyperlipidemia, bronchitis, anxiety, depression, and panic attacks presenting with left sided weakness, numbness, unsteadiness, and dizziness.  She did not receive IV t-PA due to mild deficits.  Possible TIA vs panic atack vs seizure:    Resultant  No deficts  CT head - normal  MRI head - no acute process identified.  MRA head - not performed.  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  EEG - negative  Carotid Doppler - Bilateral 1-39% ICA stenosis, antegrade vertebral flow. 2D Echo - Left ventricle: The cavity size was normal. Wall thickness was  increased in a pattern of mild LVH. Systolic function was normal.   The estimated ejection fraction was in the range of 60% to 65%.   Left ventricular diastolic function parameters were normal. - Left atrium: The atrium was mildly dilated.  - Atrial septum: No defect or patent foramen ovale was identified.  LDL - 88  HgbA1c - 5.5  VTE prophylaxis - Lovenox  Diet Heart Room service appropriate? Yes; Fluid consistency: Thin  No antithrombotic prior to admission, now on aspirin 325 mg daily  Patient counseled to be compliant with her antithrombotic medications  Ongoing aggressive stroke risk factor management  Therapy recommendations:  No needs identified.  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  Hospital day #  0  I have personally examined this patient, reviewed notes, independently viewed imaging studies, participated in medical decision making and plan of care.ROS completed by me personally and pertinent positives fully documented  I have made any additions or clarifications directly to the above note. Agree with note above.  She presented with transient episode of perioral and left-sided numbness in  Presyncopal symptoms and dizziness etiology unclear TIA versus anxiety/stress versus complex partial seizure. Recommend check EEG and  discharged home later today. Continue aspirin and Crestor. Discussed with Dr. Wynetta Emery. Stroke team will sign off. Follow-up as an outpatient in the clinic in Neurology Clinic in 6 weeks  Discharge Diagnoses:  Principal Problem:   Paresthesias Active Problems:   Gait disturbance   Leukocytosis   Essential hypertension   HLD (hyperlipidemia)   Tobacco abuse    Discharge Instructions  Discharge Instructions    Diet - low sodium heart healthy    Complete by:  As directed    Increase activity slowly    Complete by:  As directed      Allergies as of 09/24/2016   No Known Allergies     Medication List    STOP taking these medications   methocarbamol 500 MG tablet Commonly known as:  ROBAXIN   ondansetron 4 MG tablet Commonly known as:  ZOFRAN   oxyCODONE-acetaminophen 5-325 MG tablet Commonly known as:  PERCOCET     TAKE these medications   amLODipine 10 MG tablet Commonly known as:  NORVASC Take 10 mg by mouth daily.   aspirin 325 MG tablet Take 1 tablet (325 mg total) by mouth daily. Start taking on:  09/25/2016   CALCIUM 1200 PO Take 1 tablet by mouth daily.   cholecalciferol 1000 units tablet Commonly known as:  VITAMIN D Take 1,000 Units by mouth 2 (two) times daily.   esomeprazole 20 MG capsule Commonly known as:  NEXIUM Take 20 mg by mouth daily before breakfast.   hydrochlorothiazide 12.5 MG capsule Commonly known as:   MICROZIDE Take 1 capsule (12.5 mg total) by mouth daily. Start taking on:  09/25/2016   losartan 100 MG tablet Commonly known as:  COZAAR Take 100 mg by mouth daily.   metoprolol succinate 50 MG 24 hr tablet Commonly known as:  TOPROL-XL Take 50 mg by mouth daily.   Omega 3 1200 MG Caps Take 1,200 mg by mouth 2 (two) times daily.   ONE-A-DAY WOMENS 50+ ADVANTAGE Tabs Take 1 tablet by mouth daily.   rosuvastatin 40 MG tablet Commonly known as:  CRESTOR Take 1 tablet (40 mg total) by mouth daily. What changed:  medication strength  how much to take   sertraline 100 MG tablet Commonly known as:  ZOLOFT Take 100 mg by mouth daily.   TURMERIC PO Take 1 capsule by mouth 2 (two) times daily.      Follow-up Information    [name], DO. Schedule an appointment as soon as  possible for a visit in 1 week(s).   Specialty:  Family Medicine Contact information: [address] [place] [address] [phone]        Garvin Fila, MD. Schedule an appointment as soon as possible for a visit in 6 week(s).   Specialties:  Neurology, Radiology Why:  hospital Follow Up  Contact information: [address] [place] [address] [phone]          No Known Allergies   Procedures/Studies:  EEG Description: The patient is awake and drowsy during the recording.  During maximal wakefulness, there is a symmetric, medium voltage 10 Hz posterior dominant rhythm that attenuates with eye opening.  The record is symmetric.  During drowsiness, there is an increase in theta slowing of the background.  Stage 2 sleep was not seen.  Photic stimulation did not elicit any abnormalities.  There were no epileptiform discharges or electrographic seizures seen.    EKG lead was unremarkable.  Impression: This awake and drowsy EEG is normal.    Clinical Correlation: A normal EEG does not exclude a clinical diagnosis of epilepsy.  If further  clinical questions remain, prolonged EEG may be helpful.  Clinical correlation is advised.   Ct Angio Head W Or Wo Contrast  Result Date: 09/23/2016 CLINICAL DATA:  Episode of left-sided weakness and numbness yesterday. EXAM: CT ANGIOGRAPHY HEAD AND NECK TECHNIQUE: Multidetector CT imaging of the head and neck was performed using the standard protocol during bolus administration of intravenous contrast. Multiplanar CT image reconstructions and MIPs were obtained to evaluate the vascular anatomy. Carotid stenosis measurements (when applicable) are obtained utilizing NASCET criteria, using the distal internal carotid diameter as the denominator. CONTRAST:  50 mL Isovue 370 COMPARISON:  MRI brain 09/22/2016.  CT of the head 09/22/2016 FINDINGS: CTA NECK FINDINGS Aortic arch: A 3 vessel arch configuration is present. Atherosclerotic calcifications are present at the aorta. There is no significant stenosis at the origins of the great vessels. Right carotid system: The right common carotid artery is within normal limits. Mild atherosclerotic changes are noted at the bifurcation without significant stenosis. There is marked tortuosity of the cervical right ICA without a significant stenosis. Left carotid system: The left common carotid artery is within normal limits. Atherosclerotic changes are present at the carotid bifurcation. There is no significant stenosis. Mild tortuosity is present in the left cervical ICA. Vertebral arteries: Knee vertebral arteries originate from the subclavian arteries bilaterally. There is no significant stenosis at the origin of either vessel. The vertebral arteries are codominant. No focal stenosis or occlusion is present in the neck. Skeleton: Mild uncovertebral spurring is present in the mid cervical spine. Mild foraminal narrowing is greatest on the left at C4-5 and C5-6. There is some straightening of the normal cervical lordosis. No focal lytic or blastic lesions are present. Other  neck: No focal mucosal or submucosal lesions are present. No significant cervical adenopathy is present. The salivary glands are unremarkable. Upper chest: The lung apices are clear. The superior mediastinum is unremarkable. Review of the MIP images confirms the above findings CTA HEAD FINDINGS Anterior circulation: The internal carotid arteries are within normal limits from the skullbase through the ICA termini bilaterally. The A1 and M1 segments are normal. The anterior communicating artery is patent. The MCA bifurcations are patent bilaterally. A 4 mm right MCA bifurcation aneurysm is present. The neck measures 2.5 mm. No additional aneurysms are present. ACA and MCA branch vessels are within normal  limits. Posterior circulation: The vertebral arteries are codominant. The PICA origins are visualized and normal. The basilar artery is unremarkable. Both posterior cerebral arteries originate from the basilar tip. PCA branch vessels are within normal limits bilaterally. Venous sinuses: Dural sinuses are patent. The straight sinus deep cerebral veins are intact. Cortical veins are unremarkable. Anatomic variants: None Delayed phase: Postcontrast images accentuate subcortical white matter disease. No pathologic enhancement is present. Review of the MIP images confirms the above findings IMPRESSION: 1. 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 2. No emerging large vessel occlusion or significant stenosis. 3. Mild atherosclerotic changes at the carotid bifurcations bilaterally without a significant stenosis. 4. Marked tortuosity of the cervical right internal carotid artery and to lesser extent the left internal carotid artery and bilateral common carotid arteries. This is nonspecific, but most commonly seen in the setting of chronic hypertension. Electronically Signed   By: San Morelle M.D.   On: 09/23/2016 13:18   Ct Angio Neck W Or Wo Contrast  Result Date: 09/23/2016 CLINICAL DATA:  Episode of  left-sided weakness and numbness yesterday. EXAM: CT ANGIOGRAPHY HEAD AND NECK TECHNIQUE: Multidetector CT imaging of the head and neck was performed using the standard protocol during bolus administration of intravenous contrast. Multiplanar CT image reconstructions and MIPs were obtained to evaluate the vascular anatomy. Carotid stenosis measurements (when applicable) are obtained utilizing NASCET criteria, using the distal internal carotid diameter as the denominator. CONTRAST:  50 mL Isovue 370 COMPARISON:  MRI brain 09/22/2016.  CT of the head 09/22/2016 FINDINGS: CTA NECK FINDINGS Aortic arch: A 3 vessel arch configuration is present. Atherosclerotic calcifications are present at the aorta. There is no significant stenosis at the origins of the great vessels. Right carotid system: The right common carotid artery is within normal limits. Mild atherosclerotic changes are noted at the bifurcation without significant stenosis. There is marked tortuosity of the cervical right ICA without a significant stenosis. Left carotid system: The left common carotid artery is within normal limits. Atherosclerotic changes are present at the carotid bifurcation. There is no significant stenosis. Mild tortuosity is present in the left cervical ICA. Vertebral arteries: Knee vertebral arteries originate from the subclavian arteries bilaterally. There is no significant stenosis at the origin of either vessel. The vertebral arteries are codominant. No focal stenosis or occlusion is present in the neck. Skeleton: Mild uncovertebral spurring is present in the mid cervical spine. Mild foraminal narrowing is greatest on the left at C4-5 and C5-6. There is some straightening of the normal cervical lordosis. No focal lytic or blastic lesions are present. Other neck: No focal mucosal or submucosal lesions are present. No significant cervical adenopathy is present. The salivary glands are unremarkable. Upper chest: The lung apices are  clear. The superior mediastinum is unremarkable. Review of the MIP images confirms the above findings CTA HEAD FINDINGS Anterior circulation: The internal carotid arteries are within normal limits from the skullbase through the ICA termini bilaterally. The A1 and M1 segments are normal. The anterior communicating artery is patent. The MCA bifurcations are patent bilaterally. A 4 mm right MCA bifurcation aneurysm is present. The neck measures 2.5 mm. No additional aneurysms are present. ACA and MCA branch vessels are within normal limits. Posterior circulation: The vertebral arteries are codominant. The PICA origins are visualized and normal. The basilar artery is unremarkable. Both posterior cerebral arteries originate from the basilar tip. PCA branch vessels are within normal limits bilaterally. Venous sinuses: Dural sinuses are patent. The straight sinus deep  cerebral veins are intact. Cortical veins are unremarkable. Anatomic variants: None Delayed phase: Postcontrast images accentuate subcortical white matter disease. No pathologic enhancement is present. Review of the MIP images confirms the above findings IMPRESSION: 1. 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 2. No emerging large vessel occlusion or significant stenosis. 3. Mild atherosclerotic changes at the carotid bifurcations bilaterally without a significant stenosis. 4. Marked tortuosity of the cervical right internal carotid artery and to lesser extent the left internal carotid artery and bilateral common carotid arteries. This is nonspecific, but most commonly seen in the setting of chronic hypertension. Electronically Signed   By: San Morelle M.D.   On: 09/23/2016 13:18   Mr Brain Wo Contrast  Result Date: 09/23/2016 CLINICAL DATA:  Initial evaluation for acute left-sided facial numbness with left arm numbness. Difficulty speaking. EXAM: MRI HEAD WITHOUT CONTRAST TECHNIQUE: Multiplanar, multiecho pulse sequences of the brain and  surrounding structures were obtained without intravenous contrast. COMPARISON:  Comparison made with prior CT from earlier same day. FINDINGS: Brain: Cerebral volume within normal limits for age. Scattered patchy T2/FLAIR hyperintense foci noted within the periventricular, deep, and subcortical white matter both cerebral hemispheres, nonspecific, but most likely related chronic small vessel ischemic disease, mild for age. No abnormal foci of restricted diffusion to suggest acute or subacute ischemia. Gray-white matter differentiation maintained. No areas of chronic infarction identified. No evidence for acute or chronic intracranial hemorrhage. No mass lesion, midline shift, or mass effect. Ventricles normal size without evidence for hydrocephalus. No extra-axial fluid collection. Major dural sinuses are grossly patent. Pituitary gland suprasellar region normal. Midline structures intact and normal. Vascular: Major intracranial vascular flow voids are well maintained. Skull and upper cervical spine: Craniocervical junction within normal limits. Visualized upper cervical spine unremarkable. Bone marrow signal intensity normal. No scalp soft tissue abnormality. Sinuses/Orbits: Globes and orbital soft tissues within normal limits. Scattered mucosal thickening throughout the paranasal sinuses, greatest within the left sphenoid and right maxillary sinus. Superimposed retention cyst present at the right maxillary sinus. No air-fluid level to suggest active sinusitis. No mastoid effusion. Inner ear structures normal. IMPRESSION: 1. No acute intracranial infarct or other process identified. 2. Mild T2/FLAIR hyperintense foci involving the supratentorial cerebral white matter, nonspecific, but most likely related to mild chronic small vessel ischemic disease. 3. Moderate allergic/inflammatory paranasal sinus disease.  The Electronically Signed   By: Jeannine Boga M.D.   On: 09/23/2016 01:02   Ct Head Code Stroke W/o  Cm  Result Date: 09/22/2016 CLINICAL DATA:  Code stroke. Acute onset of left hand tingling and numbness. Last seen normal 4 hours ago. EXAM: CT HEAD WITHOUT CONTRAST TECHNIQUE: Contiguous axial images were obtained from the base of the skull through the vertex without intravenous contrast. COMPARISON:  CT head without contrast 06/11/2014 FINDINGS: Brain: No acute infarct, hemorrhage, or mass lesion is present. The ventricles are of normal size. No significant extraaxial fluid collection is present. Vascular: No hyperdense vessel or unexpected calcification. Skull: The calvarium is intact. No focal lytic or blastic lesions are present. Sinuses/Orbits: Right maxillary mucosal disease appears chronic. There is mucosal thickening in the anterior ethmoid air cells and inferior left frontal sinus. Mild mucosal thickening is present in the left sphenoid sinus. ASPECTS [facility] Stroke Program Early CT Score) - Ganglionic level infarction (caudate, lentiform nuclei, internal capsule, insula, M1-M3 cortex): 7/7 - Supraganglionic infarction (M4-M6 cortex): 3/3 Total score (0-10 with 10 being normal): 10/10 IMPRESSION: 1. Normal CT appearance of the brain. 2. Mild diffuse  sinus disease. 3. ASPECTS is 10/10 These results were text paged at the time of interpretation on 09/22/2016 at 7:55 pm to Dr. Leonel Ramsay . Electronically Signed   By: San Morelle M.D.   On: 09/22/2016 19:55      Subjective: Pt without complaints.  Has occasional lip tingling  Discharge Exam: Vitals:   09/24/16 1332 09/24/16 1436  BP: (!) 164/82 (!) 152/73  Pulse: 64 70  Resp: 18   Temp: 98.1 F (36.7 C)    Vitals:   09/24/16 0443 09/24/16 0941 09/24/16 1332 09/24/16 1436  BP: (!) 169/77 (!) 156/72 (!) 164/82 (!) 152/73  Pulse: 70 69 64 70  Resp: 16 18 18    Temp: 98.4 F (36.9 C) 98.6 F (37 C) 98.1 F (36.7 C)   TempSrc: Oral Oral Oral   SpO2: 98% 99% 99% 98%  Weight:      Height:       General exam: awake, alert. NAD.  Cooperative.  Respiratory system: Clear. No increased work of breathing. Cardiovascular system: S1 & S2 heard, RRR. No JVD, murmurs, gallops, clicks or pedal edema. Gastrointestinal system: Abdomen is nondistended, soft and nontender. Normal bowel sounds heard. Central nervous system: Alert and oriented. No focal neurological deficits. Extremities: no CCE.  The results of significant diagnostics from this hospitalization (including imaging, microbiology, ancillary and laboratory) are listed below for reference.     Microbiology: No results found for this or any previous visit (from the past 240 hour(s)).   Labs: BNP (last 3 results) No results for input(s): BNP in the last 8760 hours. Basic Metabolic Panel:  Recent Labs Lab 09/22/16 1939 09/22/16 1948 09/23/16 0312  NA 136 138 138  K 3.5 3.6 3.6  CL 103 103 104  CO2 21*  --  26  GLUCOSE 95 90 91  BUN 11 15 13   CREATININE 0.62 0.70 0.52  CALCIUM 9.2  --  8.7*  MG 2.0  --   --    Liver Function Tests:  Recent Labs Lab 09/22/16 1939  AST 17  ALT 20  ALKPHOS 69  BILITOT 0.6  PROT 6.4*  ALBUMIN 4.0   No results for input(s): LIPASE, AMYLASE in the last 168 hours. No results for input(s): AMMONIA in the last 168 hours. CBC:  Recent Labs Lab 09/22/16 1939 09/22/16 1948 09/23/16 0312  WBC 12.3*  --  9.5  NEUTROABS 7.2  --  4.8  HGB 14.7 14.6 13.7  HCT 42.7 43.0 41.3  MCV 97.0  --  98.1  PLT 285  --  279   Cardiac Enzymes: No results for input(s): CKTOTAL, CKMB, CKMBINDEX, TROPONINI in the last 168 hours. BNP: Invalid input(s): POCBNP CBG:  Recent Labs Lab 09/22/16 1939  GLUCAP 89   D-Dimer No results for input(s): DDIMER in the last 72 hours. Hgb A1c  Recent Labs  09/23/16 0312  HGBA1C 5.5   Lipid Profile  Recent Labs  09/23/16 0312  CHOL 181  HDL 59  LDLCALC 88  TRIG 170*  CHOLHDL 3.1   Thyroid function studies No results for input(s): TSH, T4TOTAL, T3FREE, THYROIDAB in the last 72  hours.  Invalid input(s): FREET3 Anemia work up No results for input(s): VITAMINB12, FOLATE, FERRITIN, TIBC, IRON, RETICCTPCT in the last 72 hours. Urinalysis    Component Value Date/Time   COLORURINE STRAW (A) 09/22/2016 2020   APPEARANCEUR CLEAR 09/22/2016 2020   LABSPEC 1.002 (L) 09/22/2016 2020   PHURINE 6.0 09/22/2016 2020   GLUCOSEU NEGATIVE 09/22/2016 2020  HGBUR SMALL (A) 09/22/2016 2020   BILIRUBINUR NEGATIVE 09/22/2016 2020   KETONESUR NEGATIVE 09/22/2016 2020   PROTEINUR NEGATIVE 09/22/2016 2020   NITRITE NEGATIVE 09/22/2016 2020   LEUKOCYTESUR NEGATIVE 09/22/2016 2020   Sepsis Labs Invalid input(s): PROCALCITONIN,  WBC,  LACTICIDVEN Microbiology No results found for this or any previous visit (from the past 240 hour(s)).  Time coordinating discharge: 31 mins  SIGNED:  Irwin Brakeman, MD  [facility] 09/24/2016, 2:51 PM Pager [phone]  If 7PM-7AM, please contact night-coverage [email or web address] Password [email or web address]

## 2016-09-24 NOTE — Progress Notes (Signed)
EEG completed, results pending. 

## 2016-09-24 NOTE — Progress Notes (Signed)
STROKE TEAM PROGRESS NOTE   HISTORY OF PRESENT ILLNESS (per record) Rebecca Gordon is a 61 y.o. female with a history of hypertension who presents with left-sided weakness and numbness started around 3:30 PM. She states that she was going into a drug store at the time, and noticed that she felt numb on her left hand. She then got dizzy and then subsequently became unsteady. When this continued, she eventually called 911 and was brought into the emergency department shortly before the end of the 4-1/2 hour IV tPA window.  Her symptoms were relatively mild and after discussing the risks and benefits of IV TPA with her, I made the recommendation to not proceed IV TPA but did discuss with her and offer it but after hearing the risks and benefits she agreed to not pursue it.   LKW: 3:30 PM tpa given?: no, mild symptoms   SUBJECTIVE (INTERVAL HISTORY) The patient's husband is at the bedside. The patient reports She is doing fine. Carotid Doppler showed no significant extracranial stenosis. Echocardiogram is pending. EEG has not yet been done. LDL cholesterol is slightly elevated at 88 mg percent and triglycerides at 170.   OBJECTIVE Temp:  [98.2 F (36.8 C)-98.6 F (37 C)] 98.6 F (37 C) (06/11 0941) Pulse Rate:  [69-75] 69 (06/11 0941) Cardiac Rhythm: Normal sinus rhythm (06/11 0700) Resp:  [16-20] 18 (06/11 0941) BP: (139-169)/(68-79) 156/72 (06/11 0941) SpO2:  [98 %-99 %] 99 % (06/11 0941)  CBC:   Recent Labs Lab 09/22/16 1939 09/22/16 1948 09/23/16 0312  WBC 12.3*  --  9.5  NEUTROABS 7.2  --  4.8  HGB 14.7 14.6 13.7  HCT 42.7 43.0 41.3  MCV 97.0  --  98.1  PLT 285  --  235    Basic Metabolic Panel:   Recent Labs Lab 09/22/16 1939 09/22/16 1948 09/23/16 0312  NA 136 138 138  K 3.5 3.6 3.6  CL 103 103 104  CO2 21*  --  26  GLUCOSE 95 90 91  BUN 11 15 13   CREATININE 0.62 0.70 0.52  CALCIUM 9.2  --  8.7*  MG 2.0  --   --     Lipid Panel:     Component Value  Date/Time   CHOL 181 09/23/2016 0312   TRIG 170 (H) 09/23/2016 0312   HDL 59 09/23/2016 0312   CHOLHDL 3.1 09/23/2016 0312   VLDL 34 09/23/2016 0312   LDLCALC 88 09/23/2016 0312   HgbA1c:  Lab Results  Component Value Date   HGBA1C 5.5 09/23/2016   Urine Drug Screen:     Component Value Date/Time   LABOPIA NONE DETECTED 09/22/2016 2020   COCAINSCRNUR NONE DETECTED 09/22/2016 2020   LABBENZ NONE DETECTED 09/22/2016 2020   AMPHETMU NONE DETECTED 09/22/2016 2020   THCU NONE DETECTED 09/22/2016 2020   LABBARB NONE DETECTED 09/22/2016 2020    Alcohol Level No results found for: [facility]  IMAGING  Mr Brain Wo Contrast 09/23/2016 1. No acute intracranial infarct or other process identified.  2. Mild T2/FLAIR hyperintense foci involving the supratentorial cerebral white matter, nonspecific, but most likely related to mild chronic small vessel ischemic disease.  3. Moderate allergic/inflammatory paranasal sinus disease.      Ct Head Code Stroke W/o Cm 09/22/2016 1. Normal CT appearance of the brain.  2. Mild diffuse sinus disease.  3. ASPECTS is 10/10     CTA Head and Neck 09/23/2016 1. 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 2. No emerging  large vessel occlusion or significant stenosis. 3. Mild atherosclerotic changes at the carotid bifurcations bilaterally without a significant stenosis. 4. Marked tortuosity of the cervical right internal carotid artery and to lesser extent the left internal carotid artery and bilateral common carotid arteries. This is nonspecific, but most commonly seen in the setting of chronic hypertension.    PHYSICAL EXAM Pleasant middle aged lady not in distress. . Afebrile. Head is nontraumatic. Neck is supple without bruit.    Cardiac exam no murmur or gallop. Lungs are clear to auscultation. Distal pulses are well felt.  Neurological Exam ;  Awake  Alert oriented x 3. Normal speech and language.eye movements full without nystagmus.fundi were not  visualized. Vision acuity and fields appear normal. Hearing is normal. Palatal movements are normal. Face symmetric. Tongue midline. Normal strength, tone, reflexes and coordination. Normal sensation. Gait deferred.    ASSESSMENT/PLAN Rebecca Gordon is a 61 y.o. female with history of hypertension, current smoker, hyperlipidemia, bronchitis, anxiety, depression, and panic attacks presenting with left sided weakness, numbness, unsteadiness, and dizziness.  She did not receive IV t-PA due to mild deficits.  Possible TIA vs panic atack vs seizure:    Resultant  No deficts  CT head - normal  MRI head - no acute process identified.  MRA head - not performed.  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  EEG - pending  Carotid Doppler - Bilateral 1-39% ICA stenosis, antegrade vertebral flow.  2D Echo - pending  LDL - 88  HgbA1c - 5.5  VTE prophylaxis - Lovenox Diet Heart Room service appropriate? Yes; Fluid consistency: Thin  No antithrombotic prior to admission, now on aspirin 325 mg daily  Patient counseled to be compliant with her antithrombotic medications  Ongoing aggressive stroke risk factor management  Therapy recommendations:  No needs identified.  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  Hospital day # 0    I have personally examined this patient, reviewed notes,  independently viewed imaging studies, participated in medical decision making and plan of care.ROS completed by me personally and pertinent positives fully documented  I have made any additions or clarifications directly to the above note. Agree with note above.  She presented with transient episode of perioral and left-sided numbness in  Presyncopal symptoms and dizziness etiology unclear TIA versus anxiety/stress versus complex partial seizure. Recommend check EEG and  discharged home later today. Continue aspirin and Crestor. Discussed with Dr. Wynetta Emery. Stroke team will sign off. Follow-up as an outpatient in the clinic in Neurology Clinic in 6 weeks Antony Contras, [place] Pager: [phone] 09/24/2016 12:43 PM   To contact Stroke Continuity provider, please refer to [email or web address] After hours, contact General Neurology

## 2016-09-24 NOTE — Procedures (Signed)
ELECTROENCEPHALOGRAM REPORT  Date of Study: 09/24/2016  Patient's Name: Rebecca Gordon MRN: [id number] Date of Birth: November 22, 1955  Referring Provider: Dwaine Gale, PA-C  Clinical History: 61 y.o. female with a history of hypertension who presents with left-sided weakness and numbness started around  Medications: acetaminophen (TYLENOL) tablet 650 mg  amLODipine (NORVASC) tablet 10 mg  aspirin tablet 325 mg  enoxaparin (LOVENOX) injection 40 mg  hydrochlorothiazide (MICROZIDE) capsule 12.5 mg  losartan (COZAAR) tablet 100 mg  metoprolol succinate (TOPROL-XL) 24 hr  nicotine (NICODERM CQ - dosed in mg/24 hours) patch  pantoprazole (PROTONIX) EC tablet 40 mg  rosuvastatin (CRESTOR) tablet 40 mg  senna-docusate (Senokot-S) tablet 1 tablet  sertraline (ZOLOFT) tablet 100 mg   Technical Summary: A multichannel digital EEG recording measured by the international 10-20 system with electrodes applied with paste and impedances below 5000 ohms performed in our laboratory with EKG monitoring in an awake and drowsy patient.  Hyperventilation was not performed.  Photic stimulation was performed.  The digital EEG was referentially recorded, reformatted, and digitally filtered in a variety of bipolar and referential montages for optimal display.    Description: The patient is awake and drowsy during the recording.  During maximal wakefulness, there is a symmetric, medium voltage 10 Hz posterior dominant rhythm that attenuates with eye opening.  The record is symmetric.  During drowsiness, there is an increase in theta slowing of the background.  Stage 2 sleep was not seen.  Photic stimulation did not elicit any abnormalities.  There were no epileptiform discharges or electrographic seizures seen.    EKG lead was unremarkable.  Impression: This awake and drowsy EEG is normal.    Clinical Correlation: A normal EEG does not exclude a clinical diagnosis of epilepsy.  If further clinical questions  remain, prolonged EEG may be helpful.  Clinical correlation is advised.  Metta Clines, DO

## 2016-09-24 NOTE — Discharge Instructions (Signed)
Seek medical care or return if symptoms worsen return or new problem develops.

## 2016-09-25 ENCOUNTER — Telehealth: Payer: Self-pay | Admitting: Behavioral Health

## 2016-09-25 NOTE — Telephone Encounter (Signed)
Transition Care Management Follow-up Telephone Call  PCP: [name], DO  Admit date: 09/22/2016 Discharge date: 09/24/2016  Admitted From: Home  Disposition: Home  Recommendations for Outpatient Follow-up:  1. Follow up with PCP in 1 weeks 2. Follow up with neurology clinic in 6 weeks.  3. Please obtain BMP/CBC in one week 4. Please follow up on the following pending results:  Discharge Condition: STABLE     How have you been since you were released from the hospital? Patient stated, "I'm doing ok, but a little weak".   Do you understand why you were in the hospital? yes   Do you understand the discharge instructions? yes   Where were you discharged to? Home   Items Reviewed:  Medications reviewed: yes  Allergies reviewed: yes  Dietary changes reviewed: yes, low-sodium, heart healthy  Referrals reviewed: yes, Follow up with PCP in 1 weeks; Follow up with neurology clinic in 6 weeks; Please obtain BMP/CBC in one week; Please follow up on the following pending results   Functional Questionnaire:   Activities of Daily Living (ADLs):   She states they are independent in the following: ambulation, bathing and hygiene, feeding, continence, grooming, toileting and dressing States they require assistance with the following: None   Any transportation issues/concerns? no   Any patient concerns? no   Confirmed importance and date/time of follow-up visits scheduled yes, 10/01/16 at 11:30 AM.  Provider Appointment booked with Dr. [name].  Confirmed with patient if condition begins to worsen call PCP or go to the ER.  Patient was given the office number and encouraged to call back with question or concerns.  : yes

## 2016-09-27 ENCOUNTER — Ambulatory Visit (INDEPENDENT_AMBULATORY_CARE_PROVIDER_SITE_OTHER): Payer: Managed Care, Other (non HMO) | Admitting: Family Medicine

## 2016-09-27 ENCOUNTER — Encounter: Payer: Self-pay | Admitting: Family Medicine

## 2016-09-27 VITALS — BP 126/70 | HR 69 | Temp 98.2°F | Resp 16 | Ht 64.0 in | Wt 137.8 lb

## 2016-09-27 DIAGNOSIS — F411 Generalized anxiety disorder: Secondary | ICD-10-CM | POA: Diagnosis not present

## 2016-09-27 DIAGNOSIS — Z87891 Personal history of nicotine dependence: Secondary | ICD-10-CM | POA: Diagnosis not present

## 2016-09-27 DIAGNOSIS — K219 Gastro-esophageal reflux disease without esophagitis: Secondary | ICD-10-CM | POA: Diagnosis not present

## 2016-09-27 DIAGNOSIS — G459 Transient cerebral ischemic attack, unspecified: Secondary | ICD-10-CM | POA: Diagnosis not present

## 2016-09-27 DIAGNOSIS — I10 Essential (primary) hypertension: Secondary | ICD-10-CM

## 2016-09-27 DIAGNOSIS — E785 Hyperlipidemia, unspecified: Secondary | ICD-10-CM | POA: Diagnosis not present

## 2016-09-27 DIAGNOSIS — Z8673 Personal history of transient ischemic attack (TIA), and cerebral infarction without residual deficits: Secondary | ICD-10-CM

## 2016-09-27 LAB — BASIC METABOLIC PANEL
BUN: 20 mg/dL (ref 6–23)
CHLORIDE: 99 meq/L (ref 96–112)
CO2: 26 meq/L (ref 19–32)
CREATININE: 0.58 mg/dL (ref 0.40–1.20)
Calcium: 10.2 mg/dL (ref 8.4–10.5)
GFR: 112.39 mL/min (ref >60.00)
Glucose, Bld: 89 mg/dL (ref 70–99)
POTASSIUM: 3.9 meq/L (ref 3.5–5.1)
Sodium: 135 mEq/L (ref 135–145)

## 2016-09-27 LAB — CBC WITH DIFFERENTIAL/PLATELET
BASOS ABS: 0.1 10*3/uL (ref 0.0–0.1)
Basophils Relative: 1.3 % (ref 0.0–3.0)
Eosinophils Absolute: 0.3 10*3/uL (ref 0.0–0.7)
Eosinophils Relative: 3.1 % (ref 0.0–5.0)
HCT: 44.3 % (ref 36.0–46.0)
Hemoglobin: 15 g/dL (ref 12.0–15.0)
LYMPHS ABS: 2.9 10*3/uL (ref 0.7–4.0)
LYMPHS PCT: 28.5 % (ref 12.0–46.0)
MCHC: 33.8 g/dL (ref 30.0–36.0)
MCV: 98.6 fl (ref 78.0–100.0)
MONOS PCT: 8.7 % (ref 3.0–12.0)
Monocytes Absolute: 0.9 10*3/uL (ref 0.1–1.0)
NEUTROS PCT: 58.4 % (ref 43.0–77.0)
Neutro Abs: 5.9 10*3/uL (ref 1.4–7.7)
Platelets: 289 10*3/uL (ref 150.0–400.0)
RBC: 4.49 Mil/uL (ref 3.87–5.11)
RDW: 13.8 % (ref 11.5–15.5)
WBC: 10 10*3/uL (ref 4.0–10.5)

## 2016-09-27 MED ORDER — METOPROLOL SUCCINATE ER 50 MG PO TB24: 50.0000 mg | ORAL_TABLET | Freq: Every day | ORAL | 3 refills | Status: DC

## 2016-09-27 MED ORDER — LOSARTAN POTASSIUM 100 MG PO TABS: 100.0000 mg | ORAL_TABLET | Freq: Every day | ORAL | 3 refills | Status: DC

## 2016-09-27 MED ORDER — ESOMEPRAZOLE MAGNESIUM 20 MG PO CPDR: 20.0000 mg | DELAYED_RELEASE_CAPSULE | Freq: Every day | ORAL | 3 refills | Status: DC

## 2016-09-27 MED ORDER — AMLODIPINE BESYLATE 10 MG PO TABS: 10.0000 mg | ORAL_TABLET | Freq: Every day | ORAL | 3 refills | Status: DC

## 2016-09-27 MED ORDER — ROSUVASTATIN CALCIUM 40 MG PO TABS: 40.0000 mg | ORAL_TABLET | Freq: Every day | ORAL | 3 refills | Status: DC

## 2016-09-27 MED ORDER — SERTRALINE HCL 100 MG PO TABS: 100.0000 mg | ORAL_TABLET | Freq: Every day | ORAL | 3 refills | Status: DC

## 2016-09-27 MED ORDER — HYDROCHLOROTHIAZIDE 12.5 MG PO CAPS: 12.5000 mg | ORAL_CAPSULE | Freq: Every day | ORAL | 3 refills | Status: DC

## 2016-09-27 NOTE — Assessment & Plan Note (Signed)
Well controlled, no changes to meds. Encouraged heart healthy diet such as the DASH diet and exercise as tolerated.  °

## 2016-09-27 NOTE — Patient Instructions (Signed)
Transient Ischemic Attack °A transient ischemic attack (TIA) is a "warning stroke" that causes stroke-like symptoms. Unlike a stroke, a TIA does not cause permanent damage to the brain. The symptoms of a TIA can happen very fast and do not last long. It is important to know the symptoms of a TIA and what to do. This can help prevent a major stroke or death. °What are the causes? °A TIA is caused by a temporary blockage in an artery in the brain or neck (carotid artery). The blockage does not allow the brain to get the blood supply it needs and can cause different symptoms. The blockage can be caused by either: °· A blood clot. °· Fatty buildup (plaque) in a neck or brain artery. ° °What increases the risk? °· High blood pressure (hypertension). °· High cholesterol. °· Diabetes mellitus. °· Heart disease. °· The buildup of plaque in the blood vessels (peripheral artery disease or atherosclerosis). °· The buildup of plaque in the blood vessels that provide blood and oxygen to the brain (carotid artery stenosis). °· An abnormal heart rhythm (atrial fibrillation). °· Obesity. °· Using any tobacco products, including cigarettes, chewing tobacco, or electronic cigarettes. °· Taking oral contraceptives, especially in combination with using tobacco. °· Physical inactivity. °· A diet high in fats, salt (sodium), and calories. °· Excessive alcohol use. °· Use of illegal drugs (especially cocaine and methamphetamine). °· Being female. °· Being African American. °· Being over the age of 55 years. °· Family history of stroke. °· Previous history of blood clots, stroke, TIA, or heart attack. °· Sickle cell disease. °What are the signs or symptoms? °TIA symptoms are the same as a stroke but are temporary. These symptoms usually develop suddenly, or may be newly present upon waking from sleep: °· Sudden weakness or numbness of the face, arm, or leg, especially on one side of the body. °· Sudden trouble walking or difficulty moving  arms or legs. °· Sudden confusion. °· Sudden personality changes. °· Trouble speaking (aphasia) or understanding. °· Difficulty swallowing. °· Sudden trouble seeing in one or both eyes. °· Double vision. °· Dizziness. °· Loss of balance or coordination. °· Sudden severe headache with no known cause. °· Trouble reading or writing. °· Loss of bowel or bladder control. °· Loss of consciousness. ° °How is this diagnosed? °Your health care provider may be able to determine the presence or absence of a TIA based on your symptoms, history, and physical exam. CT scan of the brain is usually performed to help identify a TIA. Other tests may include: °· Electrocardiography (ECG). °· Continuous heart monitoring. °· Echocardiography. °· Carotid ultrasonography. °· MRI. °· A scan of the brain circulation. °· Blood tests. ° °How is this treated? °Since the symptoms of TIA are the same as a stroke, it is important to seek treatment as soon as possible. You may need a medicine to dissolve a blood clot (thrombolytic) if that is the cause of the TIA. This medicine cannot be given if too much time has passed. Treatment may also include: °· Rest, oxygen, fluids through an IV tube, and medicines to thin the blood (anticoagulants). °· Measures will be taken to prevent short-term and long-term complications, including infection from breathing foreign material into the lungs (aspiration pneumonia), blood clots in the legs, and falls. °· Procedures to either remove plaque in the carotid arteries or dilate carotid arteries that have narrowed due to plaque. Those procedures are: °? Carotid endarterectomy. °? Carotid angioplasty and stenting. °· Medicines   and diet may be used to address diabetes, high blood pressure, and other underlying risk factors. ° °Follow these instructions at home: °· Take medicines only as directed by your health care provider. Follow the directions carefully. Medicines may be used to control risk factors for a stroke.  Be sure you understand all your medicine instructions. °· You may be told to take aspirin or the anticoagulant warfarin. Warfarin needs to be taken exactly as instructed. °? Taking too much or too little warfarin is dangerous. Too much warfarin increases the risk of bleeding. Too little warfarin continues to allow the risk for blood clots. While taking warfarin, you will need to have regular blood tests to measure your blood clotting time. A PT blood test measures how long it takes for blood to clot. Your PT is used to calculate another value called an INR. Your PT and INR help your health care provider to adjust your dose of warfarin. The dose can change for many reasons. It is critically important that you take warfarin exactly as prescribed. °? Many foods, especially foods high in vitamin K can interfere with warfarin and affect the PT and INR. Foods high in vitamin K include spinach, kale, broccoli, cabbage, collard and turnip greens, Brussels sprouts, peas, cauliflower, seaweed, and parsley, as well as beef and pork liver, green tea, and soybean oil. You should eat a consistent amount of foods high in vitamin K. Avoid major changes in your diet, or notify your health care provider before changing your diet. Arrange a visit with a dietitian to answer your questions. °? Many medicines can interfere with warfarin and affect the PT and INR. You must tell your health care provider about any and all medicines you take; this includes all vitamins and supplements. Be especially cautious with aspirin and anti-inflammatory medicines. Do not take or discontinue any prescribed or over-the-counter medicine except on the advice of your health care provider or pharmacist. °? Warfarin can have side effects, such as excessive bruising or bleeding. You will need to hold pressure over cuts for longer than usual. Your health care provider or pharmacist will discuss other potential side effects. °? Avoid sports or activities that  may cause injury or bleeding. °? Be careful when shaving, flossing your teeth, or handling sharp objects. °? Alcohol can change the body's ability to handle warfarin. It is best to avoid alcoholic drinks or consume only very small amounts while taking warfarin. Notify your health care provider if you change your alcohol intake. °? Notify your dentist or other health care providers before procedures. °· Eat a diet that includes 5 or more servings of fruits and vegetables each day. This may reduce the risk of stroke. Certain diets may be prescribed to address high blood pressure, high cholesterol, diabetes, or obesity. °? A diet low in sodium, saturated fat, trans fat, and cholesterol is recommended to manage high blood pressure. °? A diet low in saturated fat, trans fat, and cholesterol, and high in fiber may control cholesterol levels. °? A controlled-carbohydrate, controlled-sugar diet is recommended to manage diabetes. °? A reduced-calorie diet that is low in sodium, saturated fat, trans fat, and cholesterol is recommended to manage obesity. °· Maintain a healthy weight. °· Stay physically active. It is recommended that you get at least 30 minutes of activity on most or all days. °· Do not use any tobacco products, including cigarettes, chewing tobacco, or electronic cigarettes. If you need help quitting, ask your health care provider. °· Limit alcohol intake   to no more than 1 drink per day for nonpregnant women and 2 drinks per day for men. One drink equals 12 ounces of beer, 5 ounces of wine, or 1½ ounces of hard liquor. °· Do not abuse drugs. °· A safe home environment is important to reduce the risk of falls. Your health care provider may arrange for specialists to evaluate your home. Having grab bars in the bedroom and bathroom is often important. Your health care provider may arrange for equipment to be used at home, such as raised toilets and a seat for the shower. °· Follow all instructions for follow-up  with your health care provider. This is very important. This includes any referrals and lab tests. Proper follow-up can prevent a stroke or another TIA from occurring. °How is this prevented? °The risk of a TIA can be decreased by appropriately treating high blood pressure, high cholesterol, diabetes, heart disease, and obesity, and by quitting smoking, limiting alcohol, and staying physically active. °Contact a health care provider if: °· You have personality changes. °· You have difficulty swallowing. °· You are seeing double. °· You have dizziness. °· You have a fever. °Get help right away if: °Any of the following symptoms may represent a serious problem that is an emergency. Do not wait to see if the symptoms will go away. Get medical help right away. Call your local emergency services (911 in [place] Do not drive yourself to the hospital. °· You have sudden weakness or numbness of the face, arm, or leg, especially on one side of the body. °· You have sudden trouble walking or difficulty moving arms or legs. °· You have sudden confusion. °· You have trouble speaking (aphasia) or understanding. °· You have sudden trouble seeing in one or both eyes. °· You have a loss of balance or coordination. °· You have a sudden, severe headache with no known cause. °· You have new chest pain or an irregular heartbeat. °· You have a partial or total loss of consciousness. ° °This information is not intended to replace advice given to you by your health care provider. Make sure you discuss any questions you have with your health care provider. °Document Released: 01/10/2005 Document Revised: 12/05/2015 Document Reviewed: 07/08/2013 °Elsevier Interactive Patient Education © 2017 [employer] ° °

## 2016-09-27 NOTE — Progress Notes (Signed)
Patient ID: Rebecca Gordon, female   DOB: 19-Jun-1955, 61 y.o.   MRN: [id number]    Subjective:  I acted as a Education administrator for Dr. [name].  Guerry Bruin, [place]   Patient ID: Rebecca Gordon, female    DOB: Jan 15, 1956, 61 y.o.   MRN: [id number]  Chief Complaint  Patient presents with  . Hospitalization Follow-up    09/22/16-09/24/16    HPI  Patient is in today for hospital follow up CVA.  She is still feeling light headed, dizziness, off balanced.   She has been having numbness in her left hand, face, mouth and tongue.  She wants to know if this is how she is suppose to feel.  Patient Care Team: [name], Alferd Apa, DO as PCP - General   Past Medical History:  Diagnosis Date  . Anxiety   . Arthritis    "right knee"  . Depression   . GERD (gastroesophageal reflux disease)    Nexium  . High cholesterol   . History of bronchitis   . History of pneumonia   . Hypertension   . Panic attacks     Past Surgical History:  Procedure Laterality Date  . CLAVICLE SURGERY Right   . COLONOSCOPY    . EYE SURGERY Right    "growth on eye removed"  . ORIF CLAVICULAR FRACTURE Right 04/28/2015   Procedure: REVISION ORIF RIGHT CLAVICAL FRACTURE, ALLOGRAFT BONE GRAFTING;  Surgeon: Justice Britain, MD;  Location: [facility];  Service: Orthopedics;  Laterality: Right;  . PLACEMENT OF BREAST IMPLANTS Bilateral   . TONSILLECTOMY      Family History  Problem Relation Age of Onset  . Heart failure Mother   . Hypertension Father   . Brain cancer Father   . Prostate cancer Father   . Skin cancer Father     Social History   Social History  . Marital status: Married    Spouse name: N/A  . Number of children: N/A  . Years of education: N/A   Occupational History  . Not on file.   Social History Main Topics  . Smoking status: Former Smoker    Quit date: 09/23/2016  . Smokeless tobacco: Never Used     Comment: Previous 1 pack a day  . Alcohol use No     Comment: daily - 1 bottle of wine--- 09/23/2016  . Drug  use: No  . Sexual activity: Yes   Other Topics Concern  . Not on file   Social History Narrative  . No narrative on file    Outpatient Medications Prior to Visit  Medication Sig Dispense Refill  . aspirin 325 MG tablet Take 1 tablet (325 mg total) by mouth daily.    . Calcium Carbonate-Vit D-Min (CALCIUM 1200 PO) Take 1 tablet by mouth daily.    . cholecalciferol (VITAMIN D) 1000 units tablet Take 1,000 Units by mouth 2 (two) times daily.    . Multiple Vitamins-Minerals (ONE-A-DAY WOMENS 50+ ADVANTAGE) TABS Take 1 tablet by mouth daily.    . Omega 3 1200 MG CAPS Take 1,200 mg by mouth 2 (two) times daily.    . TURMERIC PO Take 1 capsule by mouth 2 (two) times daily.    Marland Kitchen amLODipine (NORVASC) 10 MG tablet Take 10 mg by mouth daily.  3  . esomeprazole (NEXIUM) 20 MG capsule Take 20 mg by mouth daily before breakfast.     . hydrochlorothiazide (MICROZIDE) 12.5 MG capsule Take 1 capsule (12.5 mg total) by mouth daily. [place]  capsule 0  . losartan (COZAAR) 100 MG tablet Take 100 mg by mouth daily.    . metoprolol succinate (TOPROL-XL) 50 MG 24 hr tablet Take 50 mg by mouth daily.  1  . rosuvastatin (CRESTOR) 40 MG tablet Take 1 tablet (40 mg total) by mouth daily. 30 tablet 0  . sertraline (ZOLOFT) 100 MG tablet Take 100 mg by mouth daily.  6   No facility-administered medications prior to visit.     No Known Allergies  Review of Systems  Constitutional: Negative for fever and malaise/fatigue.  HENT: Negative for congestion, ear discharge, ear pain, sinus pain and sore throat.   Eyes: Negative for blurred vision, double vision, photophobia and pain.  Respiratory: Negative for cough, shortness of breath and wheezing.   Cardiovascular: Negative for chest pain, palpitations and leg swelling.  Gastrointestinal: Negative for abdominal pain, constipation, heartburn, nausea and vomiting.  Musculoskeletal: Negative for back pain, falls, joint pain and myalgias.  Skin: Negative for rash.    Neurological: Positive for dizziness and sensory change. Negative for focal weakness, seizures, loss of consciousness and headaches.       Light headed, numbness in left hand, face, mouth, and tongue.  Psychiatric/Behavioral: Negative for depression. The patient is not nervous/anxious.        Objective:    Physical Exam  Constitutional: She is oriented to person, place, and time. She appears well-developed and well-nourished.  HENT:  Head: Normocephalic and atraumatic.  Right Ear: Hearing normal.  Left Ear: Hearing normal.  Nose: Right sinus exhibits no maxillary sinus tenderness and no frontal sinus tenderness. Left sinus exhibits no maxillary sinus tenderness and no frontal sinus tenderness.  Mouth/Throat: No oropharyngeal exudate or posterior oropharyngeal erythema.  Eyes: Conjunctivae and EOM are normal.  Neck: Normal range of motion. Neck supple. No JVD present. Carotid bruit is not present. No thyromegaly present.  Cardiovascular: Normal rate, regular rhythm and normal heart sounds.   No murmur heard. Pulmonary/Chest: Effort normal and breath sounds normal. No stridor. No respiratory distress. She has no wheezes. She has no rales. She exhibits no tenderness.  Abdominal: Soft. She exhibits no distension. There is no tenderness.  Musculoskeletal: Normal range of motion. She exhibits no edema or deformity.  Lymphadenopathy:    She has no cervical adenopathy.  Neurological: She is alert and oriented to person, place, and time. She displays normal reflexes. No cranial nerve deficit. She exhibits normal muscle tone. Coordination normal.  Psychiatric: She has a normal mood and affect. Her behavior is normal. Judgment and thought content normal.  Nursing note and vitals reviewed.   BP 126/70 (BP Location: Right Arm, Cuff Size: Normal)   Pulse 69   Temp 98.2 F (36.8 C) (Oral)   Resp 16   Ht 5\' 4"  (1.626 m)   Wt 137 lb 12.8 oz (62.5 kg)   SpO2 97%   BMI 23.65 kg/m  Wt Readings  from Last 3 Encounters:  09/27/16 137 lb 12.8 oz (62.5 kg)  09/23/16 140 lb 1.6 oz (63.5 kg)  04/28/15 138 lb (62.6 kg)   BP Readings from Last 3 Encounters:  09/27/16 126/70  09/24/16 (!) 152/73  04/28/15 (!) 146/64      There is no immunization history on file for this patient.  Health Maintenance  Topic Date Due  . Hepatitis C Screening  1956/03/22  . TETANUS/TDAP  11/23/1974  . PAP SMEAR  11/22/1976  . MAMMOGRAM  11/22/2005  . COLONOSCOPY  11/22/2005  . INFLUENZA VACCINE  11/14/2016  . HIV Screening  Completed    Lab Results  Component Value Date   WBC 9.5 09/23/2016   HGB 13.7 09/23/2016   HCT 41.3 09/23/2016   PLT 279 09/23/2016   GLUCOSE 91 09/23/2016   CHOL 181 09/23/2016   TRIG 170 (H) 09/23/2016   HDL 59 09/23/2016   LDLCALC 88 09/23/2016   ALT 20 09/22/2016   AST 17 09/22/2016   NA 138 09/23/2016   K 3.6 09/23/2016   CL 104 09/23/2016   CREATININE 0.52 09/23/2016   BUN 13 09/23/2016   CO2 26 09/23/2016   INR 0.91 09/22/2016   HGBA1C 5.5 09/23/2016    No results found for: TSH Lab Results  Component Value Date   WBC 9.5 09/23/2016   HGB 13.7 09/23/2016   HCT 41.3 09/23/2016   MCV 98.1 09/23/2016   PLT 279 09/23/2016   Lab Results  Component Value Date   NA 138 09/23/2016   K 3.6 09/23/2016   CO2 26 09/23/2016   GLUCOSE 91 09/23/2016   BUN 13 09/23/2016   CREATININE 0.52 09/23/2016   BILITOT 0.6 09/22/2016   ALKPHOS 69 09/22/2016   AST 17 09/22/2016   ALT 20 09/22/2016   PROT 6.4 (L) 09/22/2016   ALBUMIN 4.0 09/22/2016   CALCIUM 8.7 (L) 09/23/2016   ANIONGAP 8 09/23/2016   Lab Results  Component Value Date   CHOL 181 09/23/2016   Lab Results  Component Value Date   HDL 59 09/23/2016   Lab Results  Component Value Date   LDLCALC 88 09/23/2016   Lab Results  Component Value Date   TRIG 170 (H) 09/23/2016   Lab Results  Component Value Date   CHOLHDL 3.1 09/23/2016   Lab Results  Component Value Date   HGBA1C 5.5  09/23/2016         Assessment & Plan:   Problem List Items Addressed This Visit      Unprioritized   Essential hypertension    Well controlled, no changes to meds. Encouraged heart healthy diet such as the DASH diet and exercise as tolerated.       Relevant Medications   amLODipine (NORVASC) 10 MG tablet   losartan (COZAAR) 100 MG tablet   hydrochlorothiazide (MICROZIDE) 12.5 MG capsule   rosuvastatin (CRESTOR) 40 MG tablet   metoprolol succinate (TOPROL-XL) 50 MG 24 hr tablet   Former smoker    Pt quit smoking in hospital and has not started back Pt encouraged to not smoke or drink etoh-- she was drinking a bottle of wine a night      Gastroesophageal reflux disease   Relevant Medications   esomeprazole (NEXIUM) 20 MG capsule   Generalized anxiety disorder    Anxiety stable on zoloft      Relevant Medications   sertraline (ZOLOFT) 100 MG tablet   History of transient ischemic attack (TIA)   HLD (hyperlipidemia)    Tolerating statin, encouraged heart healthy diet, avoid trans fats, minimize simple carbs and saturated fats. Increase exercise as tolerated      Relevant Medications   amLODipine (NORVASC) 10 MG tablet   losartan (COZAAR) 100 MG tablet   hydrochlorothiazide (MICROZIDE) 12.5 MG capsule   rosuvastatin (CRESTOR) 40 MG tablet   metoprolol succinate (TOPROL-XL) 50 MG 24 hr tablet   Transient cerebral ischemia - Primary    F/u neuro con't aspirin daily      Relevant Medications   amLODipine (NORVASC) 10 MG tablet   losartan (COZAAR) 100  MG tablet   hydrochlorothiazide (MICROZIDE) 12.5 MG capsule   rosuvastatin (CRESTOR) 40 MG tablet   metoprolol succinate (TOPROL-XL) 50 MG 24 hr tablet   Other Relevant Orders   CBC with Differential/Platelet   Basic Metabolic Panel (BMET)   Ambulatory referral to Neurology    Other Visit Diagnoses    Hyperlipidemia LDL goal <70       Relevant Medications   amLODipine (NORVASC) 10 MG tablet   losartan (COZAAR)  100 MG tablet   hydrochlorothiazide (MICROZIDE) 12.5 MG capsule   rosuvastatin (CRESTOR) 40 MG tablet   metoprolol succinate (TOPROL-XL) 50 MG 24 hr tablet      I have changed Ms. Munford's amLODipine, losartan, sertraline, esomeprazole, and metoprolol succinate. I am also having her maintain her Calcium Carbonate-Vit D-Min (CALCIUM 1200 PO), cholecalciferol, Omega 3, ONE-A-DAY WOMENS 50+ ADVANTAGE, TURMERIC PO, aspirin, hydrochlorothiazide, and rosuvastatin.  Meds ordered this encounter  Medications  . amLODipine (NORVASC) 10 MG tablet    Sig: Take 1 tablet (10 mg total) by mouth daily.    Dispense:  90 tablet    Refill:  3  . losartan (COZAAR) 100 MG tablet    Sig: Take 1 tablet (100 mg total) by mouth daily.    Dispense:  90 tablet    Refill:  3  . sertraline (ZOLOFT) 100 MG tablet    Sig: Take 1 tablet (100 mg total) by mouth daily.    Dispense:  90 tablet    Refill:  3  . hydrochlorothiazide (MICROZIDE) 12.5 MG capsule    Sig: Take 1 capsule (12.5 mg total) by mouth daily.    Dispense:  90 capsule    Refill:  3  . esomeprazole (NEXIUM) 20 MG capsule    Sig: Take 1 capsule (20 mg total) by mouth daily before breakfast.    Dispense:  90 capsule    Refill:  3  . rosuvastatin (CRESTOR) 40 MG tablet    Sig: Take 1 tablet (40 mg total) by mouth daily.    Dispense:  90 tablet    Refill:  3  . metoprolol succinate (TOPROL-XL) 50 MG 24 hr tablet    Sig: Take 1 tablet (50 mg total) by mouth daily.    Dispense:  90 tablet    Refill:  3    CMA served as scribe during this visit. History, Physical and Plan performed by medical provider. Documentation and orders reviewed and attested to.  [name], DO

## 2016-09-27 NOTE — Assessment & Plan Note (Signed)
Pt quit smoking in hospital and has not started back Pt encouraged to not smoke or drink etoh-- she was drinking a bottle of wine a night

## 2016-09-27 NOTE — Assessment & Plan Note (Signed)
Anxiety stable on zoloft

## 2016-09-27 NOTE — Assessment & Plan Note (Signed)
Tolerating statin, encouraged heart healthy diet, avoid trans fats, minimize simple carbs and saturated fats. Increase exercise as tolerated 

## 2016-09-27 NOTE — Assessment & Plan Note (Signed)
F/u neuro con't aspirin daily

## 2016-10-01 ENCOUNTER — Inpatient Hospital Stay: Payer: Self-pay | Admitting: Family Medicine

## 2016-10-15 ENCOUNTER — Encounter: Payer: Self-pay | Admitting: Neurology

## 2016-10-15 ENCOUNTER — Ambulatory Visit (INDEPENDENT_AMBULATORY_CARE_PROVIDER_SITE_OTHER): Payer: Managed Care, Other (non HMO) | Admitting: Neurology

## 2016-10-15 VITALS — BP 137/74 | HR 78 | Ht 64.0 in | Wt 136.4 lb

## 2016-10-15 DIAGNOSIS — R202 Paresthesia of skin: Secondary | ICD-10-CM | POA: Diagnosis not present

## 2016-10-15 NOTE — Patient Instructions (Signed)
I had a long d/w patient and her husband about her recent stroke like symptoms, risk for recurrent stroke/TIAs, personally independently reviewed imaging studies and stroke evaluation results and answered questions.Continue aspirin 325 mg daily  for secondary stroke prevention and maintain strict control of hypertension with blood pressure goal below 130/90, diabetes with hemoglobin A1c goal below 6.5% and lipids with LDL cholesterol goal below 70 mg/dL. I also advised the patient to eat a healthy diet with plenty of whole grains, cereals, fruits and vegetables, exercise regularly and maintain ideal body weight Followup in the future with my nurse practitioner in 3 months or call earlier if necessary  Stroke Prevention Some medical conditions and behaviors are associated with an increased chance of having a stroke. You may prevent a stroke by making healthy choices and managing medical conditions. How can I reduce my risk of having a stroke?  Stay physically active. Get at least 30 minutes of activity on most or all days.  Do not smoke. It may also be helpful to avoid exposure to secondhand smoke.  Limit alcohol use. Moderate alcohol use is considered to be: ? No more than 2 drinks per day for men. ? No more than 1 drink per day for nonpregnant women.  Eat healthy foods. This involves: ? Eating 5 or more servings of fruits and vegetables a day. ? Making dietary changes that address high blood pressure (hypertension), high cholesterol, diabetes, or obesity.  Manage your cholesterol levels. ? Making food choices that are high in fiber and low in saturated fat, trans fat, and cholesterol may control cholesterol levels. ? Take any prescribed medicines to control cholesterol as directed by your health care provider.  Manage your diabetes. ? Controlling your carbohydrate and sugar intake is recommended to manage diabetes. ? Take any prescribed medicines to control diabetes as directed by your  health care provider.  Control your hypertension. ? Making food choices that are low in salt (sodium), saturated fat, trans fat, and cholesterol is recommended to manage hypertension. ? Ask your health care provider if you need treatment to lower your blood pressure. Take any prescribed medicines to control hypertension as directed by your health care provider. ? If you are 41-54 years of age, have your blood pressure checked every 3-5 years. If you are 1 years of age or older, have your blood pressure checked every year.  Maintain a healthy weight. ? Reducing calorie intake and making food choices that are low in sodium, saturated fat, trans fat, and cholesterol are recommended to manage weight.  Stop drug abuse.  Avoid taking birth control pills. ? Talk to your health care provider about the risks of taking birth control pills if you are over 1 years old, smoke, get migraines, or have ever had a blood clot.  Get evaluated for sleep disorders (sleep apnea). ? Talk to your health care provider about getting a sleep evaluation if you snore a lot or have excessive sleepiness.  Take medicines only as directed by your health care provider. ? For some people, aspirin or blood thinners (anticoagulants) are helpful in reducing the risk of forming abnormal blood clots that can lead to stroke. If you have the irregular heart rhythm of atrial fibrillation, you should be on a blood thinner unless there is a good reason you cannot take them. ? Understand all your medicine instructions.  Make sure that other conditions (such as anemia or atherosclerosis) are addressed. Get help right away if:  You have sudden weakness or  numbness of the face, arm, or leg, especially on one side of the body.  Your face or eyelid droops to one side.  You have sudden confusion.  You have trouble speaking (aphasia) or understanding.  You have sudden trouble seeing in one or both eyes.  You have sudden trouble  walking.  You have dizziness.  You have a loss of balance or coordination.  You have a sudden, severe headache with no known cause.  You have new chest pain or an irregular heartbeat. Any of these symptoms may represent a serious problem that is an emergency. Do not wait to see if the symptoms will go away. Get medical help at once. Call your local emergency services (911 in [place] Do not drive yourself to the hospital. This information is not intended to replace advice given to you by your health care provider. Make sure you discuss any questions you have with your health care provider. Document Released: 05/10/2004 Document Revised: 09/08/2015 Document Reviewed: 10/03/2012 Elsevier Interactive Patient Education  2017 [employer].

## 2016-10-15 NOTE — Progress Notes (Signed)
[facility] [address] [place]. [place] [address] [phone]       OFFICE FOLLOW-UP NOTE  [name] Date of Birth:  09-17-1955 Medical Record Number:  [id number]   HPI: 61 year old Caucasian lady seen today for first office follow-up visit following hospital admission for TIA in June 2018. She is accompanied by husband. History is obtained from the patient, review of electronic medical records and have personally reviewed imaging studies and lab results. Rebecca A Frawleyis a 61 y.o.femalewith a history of hypertension who presents with left-sided weakness and numbness started around 3:30 PM. She states that she was going into a drug store at the time, and noticed that she felt numb on her left hand. She then got dizzy and then subsequently became unsteady. When this continued, she eventually called 911 and was brought into the emergency department shortly before the end of the 4-1/2 hour IV tPA window.Her symptoms were relatively mild and after discussing the risks and benefits of IV TPA with her, I made the recommendation to not proceed IV TPA but did discuss with her and offer it but after hearing the risks and benefits she agreed to not pursue it.LKW: 3:30 PM on 09/22/16.tpa given?: no, mild symptoms. CT scan of the head was unremarkable. 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 MRI scan showed no acute infarct. Mild nonspecific changes of small vessel disease. LDL cholesterol was 88 mg percent. Hemoglobin A1c was 5.5. Echocardiogram showed normal ejection fraction. Carotid ultrasound was unremarkable. Patient states she is doing well she is about 75% better. Numbness is still present partially in the right face as well as one fingertip. He still has some intermittent tingling. She has had no new or recurrent symptoms. The patient is currently unemployed but used to work as a Government social research officer. She is tolerating  aspirin well without needing of bruising. She states her blood pressure is under good control and today it is 137/74. She is tolerating Crestor well without muscle aches and pains. She has no new complaints  ROS:   14 system review of systems is positive for  PMH:  Past Medical History:  Diagnosis Date  . Anxiety   . Arthritis    "right knee"  . Depression   . GERD (gastroesophageal reflux disease)    Nexium  . High cholesterol   . History of bronchitis   . History of pneumonia   . Hypertension   . Panic attacks     Social History:  Social History   Social History  . Marital status: Married    Spouse name: N/A  . Number of children: 2  . Years of education: College   Occupational History  . Unemployed    Social History Main Topics  . Smoking status: Former Smoker    Packs/day: 1.50    Quit date: 09/23/2016  . Smokeless tobacco: Never Used     Comment: Previous 1 pack a day  . Alcohol use No     Comment: daily - 1 bottle of wine--- 09/23/2016, 3-4 glasses--- 10/15/16  . Drug use: No  . Sexual activity: Yes   Other Topics Concern  . Not on file   Social History Narrative   Lives at home w/ her husband   Right-handed   Caffeine: 2-3 cups per day    Medications:   Current Outpatient Prescriptions on File Prior to Visit  Medication Sig Dispense Refill  . amLODipine (NORVASC) 10  MG tablet Take 1 tablet (10 mg total) by mouth daily. 90 tablet 3  . aspirin 325 MG tablet Take 1 tablet (325 mg total) by mouth daily.    . Calcium Carbonate-Vit D-Min (CALCIUM 1200 PO) Take 1 tablet by mouth daily.    . cholecalciferol (VITAMIN D) 1000 units tablet Take 1,000 Units by mouth 2 (two) times daily.    Marland Kitchen esomeprazole (NEXIUM) 20 MG capsule Take 1 capsule (20 mg total) by mouth daily before breakfast. 90 capsule 3  . hydrochlorothiazide (MICROZIDE) 12.5 MG capsule Take 1 capsule (12.5 mg total) by mouth daily. 90 capsule 3  . losartan (COZAAR) 100 MG tablet Take 1 tablet (100  mg total) by mouth daily. 90 tablet 3  . metoprolol succinate (TOPROL-XL) 50 MG 24 hr tablet Take 1 tablet (50 mg total) by mouth daily. 90 tablet 3  . Multiple Vitamins-Minerals (ONE-A-DAY WOMENS 50+ ADVANTAGE) TABS Take 1 tablet by mouth daily.    . Omega 3 1200 MG CAPS Take 1,200 mg by mouth 2 (two) times daily.    . rosuvastatin (CRESTOR) 40 MG tablet Take 1 tablet (40 mg total) by mouth daily. 90 tablet 3  . sertraline (ZOLOFT) 100 MG tablet Take 1 tablet (100 mg total) by mouth daily. 90 tablet 3  . TURMERIC PO Take 1 capsule by mouth 2 (two) times daily.     No current facility-administered medications on file prior to visit.     Allergies:  No Known Allergies  Physical Exam General: well developed, well nourished, Middle-aged Caucasian lady seated, in no evident distress Head: head normocephalic and atraumatic.  Neck: supple with no carotid or supraclavicular bruits Cardiovascular: regular rate and rhythm, no murmurs Musculoskeletal: no deformity Skin:  no rash/petichiae Vascular:  Normal pulses all extremities Vitals:   10/15/16 1031  BP: 137/74  Pulse: 78   Neurologic Exam Mental Status: Awake and fully alert. Oriented to place and time. Recent and remote memory intact. Attention span, concentration and fund of knowledge appropriate. Mood and affect appropriate.  Cranial Nerves: Fundoscopic exam reveals sharp disc margins. Pupils equal, briskly reactive to light. Extraocular movements full without nystagmus. Visual fields full to confrontation. Hearing intact. Facial sensation intact. Face, tongue, palate moves normally and symmetrically.  Motor: Normal bulk and tone. Normal strength in all tested extremity muscles. Sensory.: intact to touch ,pinprick .position and vibratory sensation.  Coordination: Rapid alternating movements normal in all extremities. Finger-to-nose and heel-to-shin performed accurately bilaterally. Gait and Station: Arises from chair without difficulty.  Stance is normal. Gait demonstrates normal stride length and balance . Able to heel, toe and tandem walk without difficulty.  Reflexes: 1+ and symmetric. Toes downgoing.   NIHSS  0 Modified Rankin  1   ASSESSMENT: 61 year old patient with episode of transient left face and arm flitting paresthesias of unclear etiology possible TIA     PLAN: I had a long d/w patient and her husband about her recent stroke like symptoms, risk for recurrent stroke/TIAs, personally independently reviewed imaging studies and stroke evaluation results and answered questions.Continue aspirin 325 mg daily  for secondary stroke prevention and maintain strict control of hypertension with blood pressure goal below 130/90, diabetes with hemoglobin A1c goal below 6.5% and lipids with LDL cholesterol goal below 70 mg/dL. I also advised the patient to eat a healthy diet with plenty of whole grains, cereals, fruits and vegetables, exercise regularly and maintain ideal body weight Followup in the future with my nurse practitioner in 3 months or call  earlier if necessary Greater than 50% of time during this 25 minute visit was spent on counseling,explanation of diagnosis, planning of further management, discussion with patient and family and coordination of care Antony Contras, MD  [facility] [address] [place], [place] [address]  Phone [phone] Fax [phone] Note: This document was prepared with digital dictation and possible smart phrase technology. Any transcriptional errors that result from this process are unintentional

## 2016-11-15 ENCOUNTER — Ambulatory Visit: Payer: Self-pay | Admitting: Neurology

## 2016-12-28 ENCOUNTER — Ambulatory Visit: Payer: Self-pay | Admitting: Family Medicine

## 2017-01-14 NOTE — Progress Notes (Addendum)
R  [facility]  PATIENT: Rebecca Gordon DOB: 04/23/1955   REASON FOR VISIT: Follow-up for TIA June 2018 HISTORY FROM: Patient    HISTORY OF PRESENT ILLNESS:UPDATE 10/02/2018CM [name], 61 year old female returns for follow-up with history of TIA in June 2018. She is currently on aspirin 325 daily for secondary stroke prevention without further TIA symptoms. She has no bruising and no signs of bleeding. She does continue to have numbness partially in the right face and  hand she still has some intermittent tingling sensations. She says she stays on the computer a lot. Blood pressure in the office today 150/90. She is compliant with her blood pressure medications. She is on Crestor 40 mg daily for hyperlipidemia without myalgias. She has had no new neurologic complaints. She returns for reevaluation  10/15/16 PS89 year old Caucasian lady seen today for first office follow-up visit following hospital admission for TIA in June 2018. She is accompanied by husband. History is obtained from the patient, review of electronic medical records and have personally reviewed imaging studies and lab results. Rebecca A Frawleyis a 61 y.o.femalewith a history ofhypertensionwho presents with left-sided weakness and numbnessstarted around 3:30 PM. She states that she was going into a drug store at the time, and noticed that she felt numb on her left hand. She then got dizzyand then subsequently became unsteady.When this continued, she eventually called 911 and was brought into the emergency department shortly before the end of the 4-1/2 hour IV tPA window.Her symptoms were relatively mild and after discussing the risks and benefits of IV TPA with her, I made the recommendation to not proceed IV TPA but did discuss with her and offer it but after hearing the risks and benefits she agreed to not pursue it.LKW: 3:30 PM on 09/22/16.tpa given?: no, mild symptoms. CT scan of the head was unremarkable.  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 MRI scan showed no acute infarct. Mild nonspecific changes of small vessel disease. LDL cholesterol was 88 mg percent. Hemoglobin A1c was 5.5. Echocardiogram showed normal ejection fraction. Carotid ultrasound was unremarkable. Patient states she is doing well she is about 75% better. Numbness is still present partially in the right face as well as one fingertip. He still has some intermittent tingling. She has had no new or recurrent symptoms. The patient is currently unemployed but used to work as a Government social research officer. She is tolerating aspirin well without needing of bruising. She states her blood pressure is under good control and today it is 137/74. She is tolerating Crestor well without muscle aches and pains. She has no new complaints   REVIEW OF SYSTEMS: Full 14 system review of systems performed and notable only for those listed, all others are neg:  Constitutional: neg  Cardiovascular: neg Ear/Nose/Throat: neg  Skin: neg Eyes: neg Respiratory: neg Gastroitestinal: neg  Hematology/Lymphatic: neg  Endocrine: neg Musculoskeletal:neg Allergy/Immunology: neg Neurological: neg Psychiatric: Anxiety on  ZOLOFT Sleep : neg   ALLERGIES: No Known Allergies  HOME MEDICATIONS: Outpatient Medications Prior to Visit  Medication Sig Dispense Refill  . amLODipine (NORVASC) 10 MG tablet Take 1 tablet (10 mg total) by mouth daily. 90 tablet 3  . aspirin 325 MG tablet Take 1 tablet (325 mg total) by mouth daily.    . Calcium Carbonate-Vit D-Min (CALCIUM 1200 PO) Take 1 tablet by mouth daily.    . cholecalciferol (VITAMIN D) 1000 units tablet Take 1,000 Units by mouth 2 (two) times  daily.    . esomeprazole (NEXIUM) 20 MG capsule Take 1 capsule (20 mg total) by mouth daily before breakfast. 90 capsule 3  . losartan (COZAAR) 100 MG tablet Take 1 tablet (100 mg total) by mouth daily. 90 tablet 3  .  metoprolol succinate (TOPROL-XL) 50 MG 24 hr tablet Take 1 tablet (50 mg total) by mouth daily. 90 tablet 3  . Multiple Vitamins-Minerals (ONE-A-DAY WOMENS 50+ ADVANTAGE) TABS Take 1 tablet by mouth daily.    . Omega 3 1200 MG CAPS Take 1,200 mg by mouth 2 (two) times daily.    . sertraline (ZOLOFT) 100 MG tablet Take 1 tablet (100 mg total) by mouth daily. (Patient taking differently: Take 50 mg by mouth daily. ) 90 tablet 3  . TURMERIC PO Take 1 capsule by mouth 2 (two) times daily.    . hydrochlorothiazide (MICROZIDE) 12.5 MG capsule Take 1 capsule (12.5 mg total) by mouth daily. 90 capsule 3  . rosuvastatin (CRESTOR) 40 MG tablet Take 1 tablet (40 mg total) by mouth daily. 90 tablet 3   No facility-administered medications prior to visit.     PAST MEDICAL HISTORY: Past Medical History:  Diagnosis Date  . Anxiety   . Arthritis    "right knee"  . Depression   . GERD (gastroesophageal reflux disease)    Nexium  . High cholesterol   . History of bronchitis   . History of pneumonia   . Hypertension   . Panic attacks     PAST SURGICAL HISTORY: Past Surgical History:  Procedure Laterality Date  . CLAVICLE SURGERY Right   . COLONOSCOPY    . EYE SURGERY Right    "growth on eye removed"  . ORIF CLAVICULAR FRACTURE Right 04/28/2015   Procedure: REVISION ORIF RIGHT CLAVICAL FRACTURE, ALLOGRAFT BONE GRAFTING;  Surgeon: Justice Britain, MD;  Location: [facility];  Service: Orthopedics;  Laterality: Right;  . PLACEMENT OF BREAST IMPLANTS Bilateral   . TONSILLECTOMY      FAMILY HISTORY: Family History  Problem Relation Age of Onset  . Heart failure Mother   . Hypertension Father   . Brain cancer Father   . Prostate cancer Father   . Skin cancer Father     SOCIAL HISTORY: Social History   Social History  . Marital status: Married    Spouse name: N/A  . Number of children: 2  . Years of education: College   Occupational History  . Unemployed    Social History Main Topics  .  Smoking status: Former Smoker    Packs/day: 1.50    Quit date: 09/23/2016  . Smokeless tobacco: Never Used     Comment: Previous 1 pack a day  . Alcohol use No     Comment: daily - 1 bottle of wine--- 09/23/2016, 3-4 glasses--- 10/15/16  . Drug use: No  . Sexual activity: Yes   Other Topics Concern  . Not on file   Social History Narrative   Lives at home w/ her husband   Right-handed   Caffeine: 2-3 cups per day     PHYSICAL EXAM  Vitals:   01/15/17 0848  BP: (!) 150/90   Pulse: 87  Weight: 134 lb 12.8 oz (61.1 kg)  Height: 5\' 4"  (1.626 m)   Body mass index is 23.14 kg/m.  Generalized: Well developed, in no acute distress  Head: normocephalic and atraumatic,. Oropharynx benign  Neck: Supple, no carotid bruits  Cardiac: Regular rate rhythm, no murmur  Musculoskeletal: No deformity  Neurological examination   Mentation: Alert oriented to time, place, history taking. Attention span and concentration appropriate. Recent and remote memory intact.  Follows all commands speech and language fluent.   Cranial nerve II-XII: Fundoscopic exam reveals sharp disc margins.Pupils were equal round reactive to light extraocular movements were full, visual field were full on confrontational test. Facial sensation and strength were normal. hearing was intact to finger rubbing bilaterally. Uvula tongue midline. head turning and shoulder shrug were normal and symmetric.Tongue protrusion into cheek strength was normal. Motor: normal bulk and tone, full strength in the BUE, BLE, fine finger movements normal, no pronator drift.  Sensory: normal and symmetric to light touch, pinprick, and  Vibration in the upper and lower extremities  Coordination: finger-nose-finger, heel-to-shin bilaterally, no dysmetria, no tremor Reflexes: 1+ upper lower and symmetric plantar responses were flexor bilaterally. Gait and Station: Rising up from seated position without assistance, normal stance,  moderate  stride, good arm swing, smooth turning, able to perform tiptoe, and heel walking without difficulty. Tandem gait is steady. No assistive device  DIAGNOSTIC DATA (LABS, IMAGING, TESTING) - I reviewed patient records, labs, notes, testing and imaging myself where available.  Lab Results  Component Value Date   WBC 10.0 09/27/2016   HGB 15.0 09/27/2016   HCT 44.3 09/27/2016   MCV 98.6 09/27/2016   PLT 289.0 09/27/2016      Component Value Date/Time   NA 135 09/27/2016 1305   K 3.9 09/27/2016 1305   CL 99 09/27/2016 1305   CO2 26 09/27/2016 1305   GLUCOSE 89 09/27/2016 1305   BUN 20 09/27/2016 1305   CREATININE 0.58 09/27/2016 1305   CALCIUM 10.2 09/27/2016 1305   PROT 6.4 (L) 09/22/2016 1939   ALBUMIN 4.0 09/22/2016 1939   AST 17 09/22/2016 1939   ALT 20 09/22/2016 1939   ALKPHOS 69 09/22/2016 1939   BILITOT 0.6 09/22/2016 1939   GFRNONAA >60 09/23/2016 0312   GFRAA >60 09/23/2016 0312   Lab Results  Component Value Date   CHOL 181 09/23/2016   HDL 59 09/23/2016   LDLCALC 88 09/23/2016   TRIG 170 (H) 09/23/2016   CHOLHDL 3.1 09/23/2016   Lab Results  Component Value Date   HGBA1C 5.5 09/23/2016     ASSESSMENT AND PLAN 61 year old patient with episode of transient left face and arm  paresthesias of unclear etiology possible TIA . Patient also has history of hypertension and panic attacks  . The patient is a current patient of Dr. Leonie Man  who is out of the office today . This note is sent to the work in doctor.     PLAN: Stressed the importance of management of risk factors to prevent further stroke/TIA Continue Aspirin for secondary stroke prevention Maintain strict control of hypertension with blood pressure goal below 130/90, today's reading150/90 continue antihypertensive medications Cholesterol with LDL cholesterol less than 70, followed by primary care,  most recent 88 continue Crestor No more than 1 alcoholic drink daily Stop smoking Exercise by walking, at  least 30 minutes daily eat healthy diet with whole grains,  fresh fruits and vegetables, lean meats and fish F/U in 6 months I spent 25 minutes in total face to face time with the patient more than 50% of which was spent counseling and coordination of care, reviewing test results reviewing medications and discussing and reviewing the diagnosis of stroke/TIA and management of risk factors Dennie Bible, [facility], [facility], APRN  [facility] [address], [place],  [place] [address] [phone]  I reviewed the above note and documentation by the Nurse Practitioner and agree with the history, physical exam, assessment and plan as outlined above.  Star Age, MD, PhD [facility])

## 2017-01-15 ENCOUNTER — Encounter: Payer: Self-pay | Admitting: Nurse Practitioner

## 2017-01-15 ENCOUNTER — Ambulatory Visit (INDEPENDENT_AMBULATORY_CARE_PROVIDER_SITE_OTHER): Payer: Managed Care, Other (non HMO) | Admitting: Nurse Practitioner

## 2017-01-15 VITALS — BP 150/90 | HR 87 | Ht 64.0 in | Wt 134.8 lb

## 2017-01-15 DIAGNOSIS — E785 Hyperlipidemia, unspecified: Secondary | ICD-10-CM

## 2017-01-15 DIAGNOSIS — R202 Paresthesia of skin: Secondary | ICD-10-CM

## 2017-01-15 DIAGNOSIS — I10 Essential (primary) hypertension: Secondary | ICD-10-CM

## 2017-01-15 DIAGNOSIS — Z8673 Personal history of transient ischemic attack (TIA), and cerebral infarction without residual deficits: Secondary | ICD-10-CM

## 2017-01-15 NOTE — Patient Instructions (Signed)
Stressed the importance of management of risk factors to prevent further stroke Continue Aspirinfor secondary stroke prevention Maintain strict control of hypertension with blood pressure goal below 130/90, today's reading150/90 continue antihypertensive medications Cholesterol with LDL cholesterol less than 70, followed by primary care,  most recent 88 continue Crestor No more than 1 alcoholic drink daily Stop smoking Exercise by walking, at least 30 minutes daily eat healthy diet with whole grains,  fresh fruits and vegetables, lean meats and fish F/U in 6 months

## 2017-02-08 ENCOUNTER — Encounter: Payer: Self-pay | Admitting: Family Medicine

## 2017-02-08 ENCOUNTER — Ambulatory Visit (INDEPENDENT_AMBULATORY_CARE_PROVIDER_SITE_OTHER): Payer: Managed Care, Other (non HMO) | Admitting: Family Medicine

## 2017-02-08 VITALS — BP 152/90 | HR 80 | Temp 97.9°F | Ht 63.6 in | Wt 135.0 lb

## 2017-02-08 DIAGNOSIS — E785 Hyperlipidemia, unspecified: Secondary | ICD-10-CM

## 2017-02-08 DIAGNOSIS — Z8673 Personal history of transient ischemic attack (TIA), and cerebral infarction without residual deficits: Secondary | ICD-10-CM | POA: Diagnosis not present

## 2017-02-08 DIAGNOSIS — F17200 Nicotine dependence, unspecified, uncomplicated: Secondary | ICD-10-CM

## 2017-02-08 DIAGNOSIS — I10 Essential (primary) hypertension: Secondary | ICD-10-CM

## 2017-02-08 LAB — COMPREHENSIVE METABOLIC PANEL
ALK PHOS: 71 U/L (ref 39–117)
ALT: 26 U/L (ref 0–35)
AST: 21 U/L (ref 0–37)
Albumin: 4.4 g/dL (ref 3.5–5.2)
BUN: 13 mg/dL (ref 6–23)
CHLORIDE: 100 meq/L (ref 96–112)
CO2: 31 meq/L (ref 19–32)
Calcium: 9.6 mg/dL (ref 8.4–10.5)
Creatinine, Ser: 0.52 mg/dL (ref 0.40–1.20)
GFR: 127.33 mL/min (ref >60.00)
GLUCOSE: 98 mg/dL (ref 70–99)
POTASSIUM: 4.1 meq/L (ref 3.5–5.1)
Sodium: 137 mEq/L (ref 135–145)
Total Bilirubin: 0.6 mg/dL (ref 0.2–1.2)
Total Protein: 6.7 g/dL (ref 6.0–8.3)

## 2017-02-08 LAB — LIPID PANEL
CHOL/HDL RATIO: 2
Cholesterol: 150 mg/dL (ref 0–200)
HDL: 73.1 mg/dL (ref >39.00)
LDL CALC: 63 mg/dL (ref 0–99)
NONHDL: 77.01
Triglycerides: 72 mg/dL (ref 0.0–149.0)
VLDL: 14.4 mg/dL (ref 0.0–40.0)

## 2017-02-08 MED ORDER — METOPROLOL SUCCINATE ER 100 MG PO TB24: 100.0000 mg | ORAL_TABLET | Freq: Every day | ORAL | 2 refills | Status: DC

## 2017-02-08 MED ORDER — ROSUVASTATIN CALCIUM 40 MG PO TABS: 40.0000 mg | ORAL_TABLET | Freq: Every day | ORAL | 3 refills | Status: DC

## 2017-02-08 NOTE — Assessment & Plan Note (Signed)
con't asp Per neuro

## 2017-02-08 NOTE — Progress Notes (Signed)
Patient ID: Rebecca Gordon, female    DOB: 1956-01-12  Age: 61 y.o. MRN: [id number]    Subjective:  Subjective  HPI Rebecca Gordon presents for f/u bp and cholesterol   Review of Systems  Constitutional: Negative for activity change, appetite change, diaphoresis, fatigue and unexpected weight change.  Eyes: Negative for pain, redness and visual disturbance.  Respiratory: Negative for cough, chest tightness, shortness of breath and wheezing.   Cardiovascular: Negative for chest pain, palpitations and leg swelling.  Endocrine: Negative for cold intolerance, heat intolerance, polydipsia, polyphagia and polyuria.  Genitourinary: Negative for difficulty urinating, dysuria and frequency.  Neurological: Negative for dizziness, light-headedness, numbness and headaches.  Psychiatric/Behavioral: Negative for behavioral problems and dysphoric mood. The patient is not nervous/anxious.     History Past Medical History:  Diagnosis Date  . Anxiety   . Arthritis    "right knee"  . Depression   . GERD (gastroesophageal reflux disease)    Nexium  . High cholesterol   . History of bronchitis   . History of pneumonia   . Hypertension   . Panic attacks     She has a past surgical history that includes Tonsillectomy; Clavicle surgery (Right); Placement of breast implants (Bilateral); Colonoscopy; Eye surgery (Right); and ORIF clavicular fracture (Right, 04/28/2015).   Her family history includes Brain cancer in her father; Heart failure in her mother; Hypertension in her father; Prostate cancer in her father; Skin cancer in her father.She reports that she has been smoking.  She has a 60.00 pack-year smoking history. She has never used smokeless tobacco. She reports that she does not drink alcohol or use drugs.  Current Outpatient Prescriptions on File Prior to Visit  Medication Sig Dispense Refill  . amLODipine (NORVASC) 10 MG tablet Take 1 tablet (10 mg total) by mouth daily. 90 tablet 3  .  aspirin 325 MG tablet Take 1 tablet (325 mg total) by mouth daily.    . Calcium Carbonate-Vit D-Min (CALCIUM 1200 PO) Take 1 tablet by mouth daily.    . cholecalciferol (VITAMIN D) 1000 units tablet Take 1,000 Units by mouth 2 (two) times daily.    Marland Kitchen esomeprazole (NEXIUM) 20 MG capsule Take 1 capsule (20 mg total) by mouth daily before breakfast. 90 capsule 3  . losartan (COZAAR) 100 MG tablet Take 1 tablet (100 mg total) by mouth daily. 90 tablet 3  . Multiple Vitamins-Minerals (ONE-A-DAY WOMENS 50+ ADVANTAGE) TABS Take 1 tablet by mouth daily.    . Omega 3 1200 MG CAPS Take 1,200 mg by mouth 2 (two) times daily.    . sertraline (ZOLOFT) 100 MG tablet Take 1 tablet (100 mg total) by mouth daily. (Patient taking differently: Take 50 mg by mouth daily. ) 90 tablet 3  . TURMERIC PO Take 1 capsule by mouth 2 (two) times daily.    . hydrochlorothiazide (MICROZIDE) 12.5 MG capsule Take 1 capsule (12.5 mg total) by mouth daily. 90 capsule 3   No current facility-administered medications on file prior to visit.      Objective:  Objective  Physical Exam  Constitutional: She is oriented to person, place, and time. She appears well-developed and well-nourished.  HENT:  Head: Normocephalic and atraumatic.  Eyes: Conjunctivae and EOM are normal.  Neck: Normal range of motion. Neck supple. No JVD present. Carotid bruit is not present. No thyromegaly present.  Cardiovascular: Normal rate, regular rhythm and normal heart sounds.   No murmur heard. Pulmonary/Chest: Effort normal and breath sounds normal. No  respiratory distress. She has no wheezes. She has no rales. She exhibits no tenderness.  Musculoskeletal: She exhibits no edema.  Neurological: She is alert and oriented to person, place, and time.  Psychiatric: She has a normal mood and affect. Her behavior is normal. Judgment and thought content normal.  Nursing note and vitals reviewed.  BP (!) 152/90   Pulse 80   Temp 97.9 F (36.6 C) (Oral)    Ht 5' 3.6" (1.615 m)   Wt 135 lb (61.2 kg)   SpO2 98%   BMI 23.47 kg/m  Wt Readings from Last 3 Encounters:  02/08/17 135 lb (61.2 kg)  01/15/17 134 lb 12.8 oz (61.1 kg)  10/15/16 136 lb 6.4 oz (61.9 kg)     Lab Results  Component Value Date   WBC 10.0 09/27/2016   HGB 15.0 09/27/2016   HCT 44.3 09/27/2016   PLT 289.0 09/27/2016   GLUCOSE 98 02/08/2017   CHOL 150 02/08/2017   TRIG 72.0 02/08/2017   HDL 73.10 02/08/2017   LDLCALC 63 02/08/2017   ALT 26 02/08/2017   AST 21 02/08/2017   NA 137 02/08/2017   K 4.1 02/08/2017   CL 100 02/08/2017   CREATININE 0.52 02/08/2017   BUN 13 02/08/2017   CO2 31 02/08/2017   INR 0.91 09/22/2016   HGBA1C 5.5 09/23/2016    Ct Angio Head W Or Wo Contrast  Result Date: 09/23/2016 CLINICAL DATA:  Episode of left-sided weakness and numbness yesterday. EXAM: CT ANGIOGRAPHY HEAD AND NECK TECHNIQUE: Multidetector CT imaging of the head and neck was performed using the standard protocol during bolus administration of intravenous contrast. Multiplanar CT image reconstructions and MIPs were obtained to evaluate the vascular anatomy. Carotid stenosis measurements (when applicable) are obtained utilizing NASCET criteria, using the distal internal carotid diameter as the denominator. CONTRAST:  50 mL Isovue 370 COMPARISON:  MRI brain 09/22/2016.  CT of the head 09/22/2016 FINDINGS: CTA NECK FINDINGS Aortic arch: A 3 vessel arch configuration is present. Atherosclerotic calcifications are present at the aorta. There is no significant stenosis at the origins of the great vessels. Right carotid system: The right common carotid artery is within normal limits. Mild atherosclerotic changes are noted at the bifurcation without significant stenosis. There is marked tortuosity of the cervical right ICA without a significant stenosis. Left carotid system: The left common carotid artery is within normal limits. Atherosclerotic changes are present at the carotid  bifurcation. There is no significant stenosis. Mild tortuosity is present in the left cervical ICA. Vertebral arteries: Knee vertebral arteries originate from the subclavian arteries bilaterally. There is no significant stenosis at the origin of either vessel. The vertebral arteries are codominant. No focal stenosis or occlusion is present in the neck. Skeleton: Mild uncovertebral spurring is present in the mid cervical spine. Mild foraminal narrowing is greatest on the left at C4-5 and C5-6. There is some straightening of the normal cervical lordosis. No focal lytic or blastic lesions are present. Other neck: No focal mucosal or submucosal lesions are present. No significant cervical adenopathy is present. The salivary glands are unremarkable. Upper chest: The lung apices are clear. The superior mediastinum is unremarkable. Review of the MIP images confirms the above findings CTA HEAD FINDINGS Anterior circulation: The internal carotid arteries are within normal limits from the skullbase through the ICA termini bilaterally. The A1 and M1 segments are normal. The anterior communicating artery is patent. The MCA bifurcations are patent bilaterally. A 4 mm right MCA bifurcation aneurysm is  present. The neck measures 2.5 mm. No additional aneurysms are present. ACA and MCA branch vessels are within normal limits. Posterior circulation: The vertebral arteries are codominant. The PICA origins are visualized and normal. The basilar artery is unremarkable. Both posterior cerebral arteries originate from the basilar tip. PCA branch vessels are within normal limits bilaterally. Venous sinuses: Dural sinuses are patent. The straight sinus deep cerebral veins are intact. Cortical veins are unremarkable. Anatomic variants: None Delayed phase: Postcontrast images accentuate subcortical white matter disease. No pathologic enhancement is present. Review of the MIP images confirms the above findings IMPRESSION: 1. 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 2. No emerging large vessel occlusion or significant stenosis. 3. Mild atherosclerotic changes at the carotid bifurcations bilaterally without a significant stenosis. 4. Marked tortuosity of the cervical right internal carotid artery and to lesser extent the left internal carotid artery and bilateral common carotid arteries. This is nonspecific, but most commonly seen in the setting of chronic hypertension. Electronically Signed   By: San Morelle M.D.   On: 09/23/2016 13:18   Ct Angio Neck W Or Wo Contrast  Result Date: 09/23/2016 CLINICAL DATA:  Episode of left-sided weakness and numbness yesterday. EXAM: CT ANGIOGRAPHY HEAD AND NECK TECHNIQUE: Multidetector CT imaging of the head and neck was performed using the standard protocol during bolus administration of intravenous contrast. Multiplanar CT image reconstructions and MIPs were obtained to evaluate the vascular anatomy. Carotid stenosis measurements (when applicable) are obtained utilizing NASCET criteria, using the distal internal carotid diameter as the denominator. CONTRAST:  50 mL Isovue 370 COMPARISON:  MRI brain 09/22/2016.  CT of the head 09/22/2016 FINDINGS: CTA NECK FINDINGS Aortic arch: A 3 vessel arch configuration is present. Atherosclerotic calcifications are present at the aorta. There is no significant stenosis at the origins of the great vessels. Right carotid system: The right common carotid artery is within normal limits. Mild atherosclerotic changes are noted at the bifurcation without significant stenosis. There is marked tortuosity of the cervical right ICA without a significant stenosis. Left carotid system: The left common carotid artery is within normal limits. Atherosclerotic changes are present at the carotid bifurcation. There is no significant stenosis. Mild tortuosity is present in the left cervical ICA. Vertebral arteries: Knee vertebral arteries originate from the subclavian arteries  bilaterally. There is no significant stenosis at the origin of either vessel. The vertebral arteries are codominant. No focal stenosis or occlusion is present in the neck. Skeleton: Mild uncovertebral spurring is present in the mid cervical spine. Mild foraminal narrowing is greatest on the left at C4-5 and C5-6. There is some straightening of the normal cervical lordosis. No focal lytic or blastic lesions are present. Other neck: No focal mucosal or submucosal lesions are present. No significant cervical adenopathy is present. The salivary glands are unremarkable. Upper chest: The lung apices are clear. The superior mediastinum is unremarkable. Review of the MIP images confirms the above findings CTA HEAD FINDINGS Anterior circulation: The internal carotid arteries are within normal limits from the skullbase through the ICA termini bilaterally. The A1 and M1 segments are normal. The anterior communicating artery is patent. The MCA bifurcations are patent bilaterally. A 4 mm right MCA bifurcation aneurysm is present. The neck measures 2.5 mm. No additional aneurysms are present. ACA and MCA branch vessels are within normal limits. Posterior circulation: The vertebral arteries are codominant. The PICA origins are visualized and normal. The basilar artery is unremarkable. Both posterior cerebral arteries originate from the basilar  tip. PCA branch vessels are within normal limits bilaterally. Venous sinuses: Dural sinuses are patent. The straight sinus deep cerebral veins are intact. Cortical veins are unremarkable. Anatomic variants: None Delayed phase: Postcontrast images accentuate subcortical white matter disease. No pathologic enhancement is present. Review of the MIP images confirms the above findings IMPRESSION: 1. 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 2. No emerging large vessel occlusion or significant stenosis. 3. Mild atherosclerotic changes at the carotid bifurcations bilaterally without a significant  stenosis. 4. Marked tortuosity of the cervical right internal carotid artery and to lesser extent the left internal carotid artery and bilateral common carotid arteries. This is nonspecific, but most commonly seen in the setting of chronic hypertension. Electronically Signed   By: San Morelle M.D.   On: 09/23/2016 13:18   Mr Brain Wo Contrast  Result Date: 09/23/2016 CLINICAL DATA:  Initial evaluation for acute left-sided facial numbness with left arm numbness. Difficulty speaking. EXAM: MRI HEAD WITHOUT CONTRAST TECHNIQUE: Multiplanar, multiecho pulse sequences of the brain and surrounding structures were obtained without intravenous contrast. COMPARISON:  Comparison made with prior CT from earlier same day. FINDINGS: Brain: Cerebral volume within normal limits for age. Scattered patchy T2/FLAIR hyperintense foci noted within the periventricular, deep, and subcortical white matter both cerebral hemispheres, nonspecific, but most likely related chronic small vessel ischemic disease, mild for age. No abnormal foci of restricted diffusion to suggest acute or subacute ischemia. Gray-white matter differentiation maintained. No areas of chronic infarction identified. No evidence for acute or chronic intracranial hemorrhage. No mass lesion, midline shift, or mass effect. Ventricles normal size without evidence for hydrocephalus. No extra-axial fluid collection. Major dural sinuses are grossly patent. Pituitary gland suprasellar region normal. Midline structures intact and normal. Vascular: Major intracranial vascular flow voids are well maintained. Skull and upper cervical spine: Craniocervical junction within normal limits. Visualized upper cervical spine unremarkable. Bone marrow signal intensity normal. No scalp soft tissue abnormality. Sinuses/Orbits: Globes and orbital soft tissues within normal limits. Scattered mucosal thickening throughout the paranasal sinuses, greatest within the left sphenoid and  right maxillary sinus. Superimposed retention cyst present at the right maxillary sinus. No air-fluid level to suggest active sinusitis. No mastoid effusion. Inner ear structures normal. IMPRESSION: 1. No acute intracranial infarct or other process identified. 2. Mild T2/FLAIR hyperintense foci involving the supratentorial cerebral white matter, nonspecific, but most likely related to mild chronic small vessel ischemic disease. 3. Moderate allergic/inflammatory paranasal sinus disease.  The Electronically Signed   By: Jeannine Boga M.D.   On: 09/23/2016 01:02   Ct Head Code Stroke W/o Cm  Result Date: 09/22/2016 CLINICAL DATA:  Code stroke. Acute onset of left hand tingling and numbness. Last seen normal 4 hours ago. EXAM: CT HEAD WITHOUT CONTRAST TECHNIQUE: Contiguous axial images were obtained from the base of the skull through the vertex without intravenous contrast. COMPARISON:  CT head without contrast 06/11/2014 FINDINGS: Brain: No acute infarct, hemorrhage, or mass lesion is present. The ventricles are of normal size. No significant extraaxial fluid collection is present. Vascular: No hyperdense vessel or unexpected calcification. Skull: The calvarium is intact. No focal lytic or blastic lesions are present. Sinuses/Orbits: Right maxillary mucosal disease appears chronic. There is mucosal thickening in the anterior ethmoid air cells and inferior left frontal sinus. Mild mucosal thickening is present in the left sphenoid sinus. ASPECTS [facility] Stroke Program Early CT Score) - Ganglionic level infarction (caudate, lentiform nuclei, internal capsule, insula, M1-M3 cortex): 7/7 - Supraganglionic infarction (M4-M6 cortex): 3/3  Total score (0-10 with 10 being normal): 10/10 IMPRESSION: 1. Normal CT appearance of the brain. 2. Mild diffuse sinus disease. 3. ASPECTS is 10/10 These results were text paged at the time of interpretation on 09/22/2016 at 7:55 pm to Dr. Leonel Ramsay . Electronically Signed   By:  San Morelle M.D.   On: 09/22/2016 19:55     Assessment & Plan:  Plan  I have discontinued Ms. Larouche's metoprolol succinate. I am also having her start on metoprolol succinate. Additionally, I am having her maintain her Calcium Carbonate-Vit D-Min (CALCIUM 1200 PO), cholecalciferol, Omega 3, ONE-A-DAY WOMENS 50+ ADVANTAGE, TURMERIC PO, aspirin, amLODipine, losartan, sertraline, hydrochlorothiazide, esomeprazole, and rosuvastatin.  Meds ordered this encounter  Medications  . metoprolol succinate (TOPROL-XL) 100 MG 24 hr tablet    Sig: Take 1 tablet (100 mg total) by mouth daily. Take with or immediately following a meal.    Dispense:  30 tablet    Refill:  2  . rosuvastatin (CRESTOR) 40 MG tablet    Sig: Take 1 tablet (40 mg total) by mouth daily.    Dispense:  90 tablet    Refill:  3    Problem List Items Addressed This Visit      Unprioritized   Essential hypertension - Primary    Poorly controlled will alter medications, encouraged DASH diet, minimize caffeine and obtain adequate sleep. Report concerning symptoms and follow up as directed and as needed      Relevant Medications   metoprolol succinate (TOPROL-XL) 100 MG 24 hr tablet   rosuvastatin (CRESTOR) 40 MG tablet   Other Relevant Orders   Lipid panel (Completed)   Comprehensive metabolic panel (Completed)   History of transient ischemic attack (TIA)    con't asp Per neuro      HLD (hyperlipidemia)    Tolerating statin, encouraged heart healthy diet, avoid trans fats, minimize simple carbs and saturated fats. Increase exercise as tolerated      Relevant Medications   metoprolol succinate (TOPROL-XL) 100 MG 24 hr tablet   rosuvastatin (CRESTOR) 40 MG tablet    Other Visit Diagnoses    Hyperlipidemia LDL goal <70       Relevant Medications   metoprolol succinate (TOPROL-XL) 100 MG 24 hr tablet   rosuvastatin (CRESTOR) 40 MG tablet   Other Relevant Orders   Lipid panel (Completed)   Comprehensive  metabolic panel (Completed)   Smoker       Relevant Orders   Ambulatory Referral for Lung Cancer Scre      Follow-up: Return in about 6 months (around 08/09/2017), or if symptoms worsen or fail to improve, for annual exam, fasting.  [name], DO

## 2017-02-08 NOTE — Assessment & Plan Note (Signed)
Poorly controlled will alter medications, encouraged DASH diet, minimize caffeine and obtain adequate sleep. Report concerning symptoms and follow up as directed and as needed 

## 2017-02-08 NOTE — Assessment & Plan Note (Signed)
Tolerating statin, encouraged heart healthy diet, avoid trans fats, minimize simple carbs and saturated fats. Increase exercise as tolerated 

## 2017-02-08 NOTE — Patient Instructions (Signed)

## 2017-02-18 ENCOUNTER — Telehealth: Payer: Self-pay | Admitting: Acute Care

## 2017-02-18 DIAGNOSIS — F17210 Nicotine dependence, cigarettes, uncomplicated: Principal | ICD-10-CM

## 2017-02-18 DIAGNOSIS — Z122 Encounter for screening for malignant neoplasm of respiratory organs: Secondary | ICD-10-CM

## 2017-02-19 NOTE — Telephone Encounter (Signed)
LMTC x 1  

## 2017-02-19 NOTE — Telephone Encounter (Signed)
Will forward to the lung nodule pool 

## 2017-02-21 NOTE — Telephone Encounter (Signed)
Spoke with pt and scheduled SDMV 03/20/17 9:00 CT ordered Nothing further needed

## 2017-03-06 ENCOUNTER — Encounter: Payer: Self-pay | Admitting: Family Medicine

## 2017-03-14 ENCOUNTER — Telehealth: Payer: Self-pay | Admitting: Pharmacist

## 2017-03-15 NOTE — Telephone Encounter (Signed)
ATC pt, no answer. Left message for pt to call back.  Left a detailed message explaining we did not have the information to tell her how much a CT scan would cost. She would have to contact the radiology department where she is getting the CT scan done at. Advised to call back if needed.

## 2017-03-18 ENCOUNTER — Telehealth: Payer: Self-pay

## 2017-03-18 ENCOUNTER — Telehealth: Payer: Self-pay | Admitting: Acute Care

## 2017-03-18 DIAGNOSIS — Z122 Encounter for screening for malignant neoplasm of respiratory organs: Secondary | ICD-10-CM

## 2017-03-18 DIAGNOSIS — F17210 Nicotine dependence, cigarettes, uncomplicated: Principal | ICD-10-CM

## 2017-03-18 NOTE — Telephone Encounter (Signed)
Error

## 2017-03-18 NOTE — Telephone Encounter (Signed)
error 

## 2017-03-18 NOTE — Telephone Encounter (Signed)
[place] x 1 See phone note 03/14/17.  Will close this message.

## 2017-03-18 NOTE — Telephone Encounter (Signed)
LMTC x 1  

## 2017-03-18 NOTE — Telephone Encounter (Signed)
Pt calling back for [place] about cost of lung screening.

## 2017-03-20 ENCOUNTER — Encounter: Payer: Self-pay | Admitting: Acute Care

## 2017-03-20 ENCOUNTER — Ambulatory Visit (INDEPENDENT_AMBULATORY_CARE_PROVIDER_SITE_OTHER)
Admission: RE | Admit: 2017-03-20 | Discharge: 2017-03-20 | Disposition: A | Discharge status: Home / Self Care | Payer: Managed Care, Other (non HMO) | Source: Ambulatory Visit | Attending: Acute Care | Admitting: Acute Care

## 2017-03-20 ENCOUNTER — Ambulatory Visit (INDEPENDENT_AMBULATORY_CARE_PROVIDER_SITE_OTHER): Payer: Managed Care, Other (non HMO) | Admitting: Acute Care

## 2017-03-20 DIAGNOSIS — Z87891 Personal history of nicotine dependence: Secondary | ICD-10-CM | POA: Diagnosis not present

## 2017-03-20 DIAGNOSIS — F17210 Nicotine dependence, cigarettes, uncomplicated: Secondary | ICD-10-CM | POA: Diagnosis not present

## 2017-03-20 DIAGNOSIS — Z122 Encounter for screening for malignant neoplasm of respiratory organs: Secondary | ICD-10-CM

## 2017-03-20 NOTE — Progress Notes (Signed)
Shared Decision Making Visit Lung Cancer Screening Program [id number])   Eligibility:  Age 61 y.o.  Pack Years Smoking History Calculation 60 pack year smoking history (# packs/per year x # years smoked)  Recent History of coughing up blood  no  Unexplained weight loss? no ( >Than 15 pounds within the last 6 months )  Prior History Lung / other cancer no (Diagnosis within the last 5 years already requiring surveillance chest CT Scans).  Smoking Status Current Smoker  Former Smokers: Years since quit: NA  Quit Date: NA  Visit Components:  Discussion included one or more decision making aids. yes  Discussion included risk/benefits of screening. yes  Discussion included potential follow up diagnostic testing for abnormal scans. yes  Discussion included meaning and risk of over diagnosis. yes  Discussion included meaning and risk of False Positives. yes  Discussion included meaning of total radiation exposure. yes  Counseling Included:  Importance of adherence to annual lung cancer LDCT screening. yes  Impact of comorbidities on ability to participate in the program. yes  Ability and willingness to under diagnostic treatment. yes  Smoking Cessation Counseling:  Current Smokers:   Discussed importance of smoking cessation. yes  Information about tobacco cessation classes and interventions provided to patient. yes  Patient provided with "ticket" for LDCT Scan. yes  Symptomatic Patient. no  Counseling NA  Diagnosis Code: Tobacco Use Z72.0  Asymptomatic Patient yes  Counseling (Intermediate counseling: > three minutes counseling) D6644  Former Smokers:   Discussed the importance of maintaining cigarette abstinence. yes  Diagnosis Code: Personal History of Nicotine Dependence. I34.742  Information about tobacco cessation classes and interventions provided to patient. Yes  Patient provided with "ticket" for LDCT Scan. yes  Written Order for Lung Cancer  Screening with LDCT placed in Epic. Yes (CT Chest Lung Cancer Screening Low Dose W/O CM) [id number] Z12.2-Screening of respiratory organs Z87.891-Personal history of nicotine dependence  I have spent 25 minutes of face to face time with Ms. Mclaurin discussing the risks and benefits of lung cancer screening. We viewed a power point together that explained in detail the above noted topics. We paused at intervals to allow for questions to be asked and answered to ensure understanding.We discussed that the single most powerful action that she can take to decrease her risk of developing lung cancer is to quit smoking. We discussed whether or not she is ready to commit to setting a quit date. She is starting a new job Monday, and is not ready to set a quit date.We discussed options for tools to aid in quitting smoking including nicotine replacement therapy, non-nicotine medications, support groups, Quit Smart classes, and behavior modification. We discussed that often times setting smaller, more achievable goals, such as eliminating 1 cigarette a day for a week and then 2 cigarettes a day for a week can be helpful in slowly decreasing the number of cigarettes smoked. This allows for a sense of accomplishment as well as providing a clinical benefit. I gave her the " Be Stronger Than Your Excuses" card with contact information for community resources, classes, free nicotine replacement therapy, and access to mobile apps, text messaging, and on-line smoking cessation help. I have also given her my card and contact information in the event she needs to contact me. We discussed the time and location of the scan, and that either Doroteo Glassman RN or I will call with the results within 24-48 hours of receiving them. I have offered her  a copy of  the power point we viewed  as a resource in the event they need reinforcement of the concepts we discussed today in the office. The patient verbalized understanding of all of  the above  and had no further questions upon leaving the office. They have my contact information in the event they have any further questions.  I spent 3 minutes counseling on smoking cessation and the health risks of continued tobacco abuse.  I explained to the patient that there has been a high incidence of coronary artery disease noted on these exams. I explained that this is a non-gated exam therefore degree or severity cannot be determined. This patient is currently on statin therapy. I have asked the patient to follow-up with their PCP regarding any incidental finding of coronary artery disease and management with diet or medication as their PCP  feels is clinically indicated. The patient verbalized understanding of the above and had no further questions upon completion of the visit.      Magdalen Spatz, NP 03/20/2017

## 2017-03-21 NOTE — Telephone Encounter (Signed)
This message was closed .  Please refer to message 03/18/17

## 2017-03-21 NOTE — Telephone Encounter (Signed)
LMTC x 1  

## 2017-03-21 NOTE — Telephone Encounter (Signed)
Pt informed of CT results per Eric Form, NP.  PT verbalized understanding.  Copy sent to PCP.  Order placed for 1 yr f/u CT. I advised that pt that we did precert the CT with [employer] but cannot tell is she will be responsible for any copays or decuctibles.  Pt verbalized understanding.  Nothing further needed.

## 2017-04-02 ENCOUNTER — Encounter (HOSPITAL_COMMUNITY): Payer: Self-pay | Admitting: Psychology

## 2017-04-02 ENCOUNTER — Ambulatory Visit (INDEPENDENT_AMBULATORY_CARE_PROVIDER_SITE_OTHER): Payer: 59 | Admitting: Psychology

## 2017-04-02 DIAGNOSIS — F1020 Alcohol dependence, uncomplicated: Secondary | ICD-10-CM

## 2017-04-02 DIAGNOSIS — F1021 Alcohol dependence, in remission: Secondary | ICD-10-CM | POA: Insufficient documentation
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
## 2017-04-03 ENCOUNTER — Other Ambulatory Visit (HOSPITAL_COMMUNITY): Payer: 59 | Attending: Psychiatry | Admitting: Psychology

## 2017-04-03 ENCOUNTER — Encounter (HOSPITAL_COMMUNITY): Payer: Self-pay | Admitting: Medical

## 2017-04-03 VITALS — BP 124/76 | HR 76 | Resp 16 | Ht 64.0 in | Wt 136.0 lb

## 2017-04-03 DIAGNOSIS — F19980 Other psychoactive substance use, unspecified with psychoactive substance-induced anxiety disorder: Secondary | ICD-10-CM | POA: Diagnosis not present

## 2017-04-03 DIAGNOSIS — F17210 Nicotine dependence, cigarettes, uncomplicated: Secondary | ICD-10-CM | POA: Diagnosis not present

## 2017-04-03 DIAGNOSIS — Z8249 Family history of ischemic heart disease and other diseases of the circulatory system: Secondary | ICD-10-CM | POA: Insufficient documentation

## 2017-04-03 DIAGNOSIS — Z79899 Other long term (current) drug therapy: Secondary | ICD-10-CM | POA: Insufficient documentation

## 2017-04-03 DIAGNOSIS — Z9889 Other specified postprocedural states: Secondary | ICD-10-CM | POA: Insufficient documentation

## 2017-04-03 DIAGNOSIS — Z8042 Family history of malignant neoplasm of prostate: Secondary | ICD-10-CM | POA: Diagnosis not present

## 2017-04-03 DIAGNOSIS — K219 Gastro-esophageal reflux disease without esophagitis: Secondary | ICD-10-CM | POA: Insufficient documentation

## 2017-04-03 DIAGNOSIS — I2510 Atherosclerotic heart disease of native coronary artery without angina pectoris: Secondary | ICD-10-CM | POA: Insufficient documentation

## 2017-04-03 DIAGNOSIS — F1020 Alcohol dependence, uncomplicated: Secondary | ICD-10-CM | POA: Insufficient documentation

## 2017-04-03 DIAGNOSIS — J432 Centrilobular emphysema: Secondary | ICD-10-CM | POA: Insufficient documentation

## 2017-04-03 DIAGNOSIS — Z7982 Long term (current) use of aspirin: Secondary | ICD-10-CM | POA: Insufficient documentation

## 2017-04-03 DIAGNOSIS — T148XXA Other injury of unspecified body region, initial encounter: Secondary | ICD-10-CM | POA: Diagnosis not present

## 2017-04-03 DIAGNOSIS — X503XXA Overexertion from repetitive movements, initial encounter: Secondary | ICD-10-CM

## 2017-04-03 DIAGNOSIS — E7800 Pure hypercholesterolemia, unspecified: Secondary | ICD-10-CM | POA: Insufficient documentation

## 2017-04-03 DIAGNOSIS — Z8659 Personal history of other mental and behavioral disorders: Secondary | ICD-10-CM | POA: Insufficient documentation

## 2017-04-03 DIAGNOSIS — F17200 Nicotine dependence, unspecified, uncomplicated: Secondary | ICD-10-CM

## 2017-04-03 DIAGNOSIS — Z8673 Personal history of transient ischemic attack (TIA), and cerebral infarction without residual deficits: Secondary | ICD-10-CM | POA: Diagnosis not present

## 2017-04-03 DIAGNOSIS — I10 Essential (primary) hypertension: Secondary | ICD-10-CM | POA: Diagnosis not present

## 2017-04-03 DIAGNOSIS — F329 Major depressive disorder, single episode, unspecified: Secondary | ICD-10-CM | POA: Diagnosis not present

## 2017-04-03 DIAGNOSIS — Z808 Family history of malignant neoplasm of other organs or systems: Secondary | ICD-10-CM | POA: Diagnosis not present

## 2017-04-03 DIAGNOSIS — J439 Emphysema, unspecified: Secondary | ICD-10-CM | POA: Insufficient documentation

## 2017-04-03 MED ORDER — BACLOFEN 10 MG PO TABS: 10.0000 mg | ORAL_TABLET | Freq: Three times a day (TID) | ORAL | 2 refills | Status: DC

## 2017-04-03 MED ORDER — GABAPENTIN 300 MG PO CAPS: ORAL_CAPSULE | ORAL | 1 refills | Status: DC
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
## 2017-04-04 ENCOUNTER — Encounter (HOSPITAL_COMMUNITY): Payer: Self-pay | Admitting: Psychology

## 2017-04-04 ENCOUNTER — Other Ambulatory Visit (HOSPITAL_COMMUNITY): Payer: 59 | Admitting: Psychology

## 2017-04-04 ENCOUNTER — Other Ambulatory Visit (HOSPITAL_COMMUNITY): Payer: Self-pay | Admitting: Medical

## 2017-04-04 DIAGNOSIS — F1020 Alcohol dependence, uncomplicated: Secondary | ICD-10-CM

## 2017-04-04 DIAGNOSIS — F19980 Other psychoactive substance use, unspecified with psychoactive substance-induced anxiety disorder: Secondary | ICD-10-CM

## 2017-04-04 MED ORDER — TOPIRAMATE 100 MG PO TABS: 100.0000 mg | ORAL_TABLET | Freq: Every day | ORAL | 0 refills | Status: DC

## 2017-04-04 NOTE — Progress Notes (Signed)
    Daily Group Progress Note  Program: CD-IOP   04/04/2017 Rebecca Gordon [id number]  Diagnosis:  Alcohol use disorder, severe, dependence ([place])  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[place])   Sobriety Date: 12/15  Group Time: 1-2:30  Participation Level: Active  Behavioral Response: Appropriate, Sharing, Motivated and Assertive  Type of Therapy: Process Group  Interventions: CBT, Strength-based and Supportive  Topic: Pts were active and engaged in process session in which pts shared about their weeks and utilization of coping skills. Pts were directed to discuss their tx goals. Pts shared about topics of recovery, 12 steps, and sobriety.      Group Time: 2:30-4  Participation Level: Active  Behavioral Response: Appropriate and Sharing  Type of Therapy: Psycho-education Group  Interventions: CBT  Topic: Pts were active and engaged in significant psychoeducation session in which counselor led pts through a handout on "cognitive distortions".     Summary: Pt was active and engaged in session. She reported she had not attended any AA since ysterday but was planning to attend an [place] at 6:30. She shared openly in group and provided many members w/ feedback despite being new to group. Pt appeared assertive and bubbly and was loquacious throughout session. She asked good questions that indicate a strong desire to enter recovery. Pt focused on her shame and guilt for "not being perfect" since she is an alcoholic, a term she is "not very comfortable w/ yet". Pt asked specific questions regarding AA culture and norms. She talked about her "relationships in recovery" and was very worried about how her younger daughter would react when she told her she was fired from her new job due to alcohol.   UDS collected: No Results: pending  AA/NA attended?: No  Sponsor?: No   Youlanda Roys, LPCA LCASA 04/04/2017 4:45 PM

## 2017-04-04 NOTE — Progress Notes (Signed)
Pt c/o somnolence with Neurontin Switch to Topomax 100 mg HS 10 days for ETOH stoppage/seizure prophylaxis

## 2017-04-04 NOTE — Progress Notes (Signed)
    Daily Group Progress Note  Program: CD-IOP   Group Time: 1-2:30 PM  Participation Level: Active  Behavioral Response: Appropriate  Type of Therapy: Process Group  Topic: Process: The first half of group was spent in process. Members shared about activities and events engaged in since we last met. A new group member was present and she introduced herself very briefly. The medical director met with two group members during the session. Two random drug tests were collected.  Group Time: 2:30-4 PM  Participation Level: Active  Behavioral Response: Appropriate and Sharing  Type of Therapy: Psycho-education Group  Topic: Psycho-Ed: Chaplain. The second half of group was spent in a psycho-ed with the monthly visitor, Best boy, Clinical biochemist at [employer]. He shared about his own life and his recovery from heroin addiction. Discussed changes made and the work he has done. Members shared about their own struggles in early recovery and the accomplishments and changes they have made. The experiential session was well received and group members were attentive and actively engaged with the visitor.    Summary: The patient was new to the group and introduced herself very briefly to her new fellow group members. She was very attentive and engaged and members checked-in. She identified herself as an alcoholic, although she had admitted to this counselor in our previous session that she wasn't really sure about that. The patient met with the medical director near the latter part of process. She was able to return in time for introductions in the psycho-ed. The patient introduced herself and explained to the chaplain that this was her first group session. He welcomed her and asked about what had brought her here? The patient stated, "I am sick and tired of being sick and tired". She responded well to this first group session and appeared fairly relaxed and comfortable as the session progressed. She  assured this counselor she would return tomorrow.   UDS collected: Yes Results: Pending  AA/NA attended?: YesMonday  Sponsor?: No   Brandon Melnick, LCAS

## 2017-04-05 ENCOUNTER — Encounter (HOSPITAL_COMMUNITY): Payer: Self-pay | Admitting: Psychology

## 2017-04-08 ENCOUNTER — Other Ambulatory Visit (HOSPITAL_COMMUNITY): Payer: 59

## 2017-04-11 ENCOUNTER — Other Ambulatory Visit (HOSPITAL_COMMUNITY): Payer: 59 | Admitting: Psychology

## 2017-04-11 DIAGNOSIS — F1020 Alcohol dependence, uncomplicated: Secondary | ICD-10-CM | POA: Diagnosis not present

## 2017-04-12 ENCOUNTER — Encounter (HOSPITAL_COMMUNITY): Payer: Self-pay | Admitting: Psychology

## 2017-04-12 NOTE — Progress Notes (Signed)
Rebecca Gordon is a 61 y.o. female patient. CD-IOP: Treatment Planning Session. Met with the patient as scheduled this morning. She had enjoyed the group session yesterday and felt she had a lot in common with the newest group member. The patient reported she had attended some different [facility] meetings and found the [place] meeting called, "Serendipity" to be her favorite one so far. We discussed the importance of identifying goals for treatment. The patient reported her #1 goal of treatment is to remain sober. She agreed that she cannot do it herself, but needs the support and network of others in recovery. She has met women very nice women in the rooms. When asked about any other goals, the patient identified developing a schedule and routine that she will follow daily. She assured me that she would not put too much on her plate on any one day, but it will make her feel better to cross things off the list and keep her accountable. The treatment plan was completed. We will meet weekly to discuss progress and any challenges or questions she has while in treatment. The patient was engaged and very receptive during our session. Her sobriety date remains 12/15.        Brandon Melnick, LCAS

## 2017-04-15 ENCOUNTER — Encounter (HOSPITAL_COMMUNITY): Payer: Self-pay | Admitting: Psychology

## 2017-04-15 ENCOUNTER — Encounter (HOSPITAL_COMMUNITY): Payer: Self-pay | Admitting: Medical

## 2017-04-15 ENCOUNTER — Other Ambulatory Visit (INDEPENDENT_AMBULATORY_CARE_PROVIDER_SITE_OTHER): Payer: 59 | Admitting: Psychology

## 2017-04-15 DIAGNOSIS — F17200 Nicotine dependence, unspecified, uncomplicated: Secondary | ICD-10-CM

## 2017-04-15 DIAGNOSIS — X503XXA Overexertion from repetitive movements, initial encounter: Secondary | ICD-10-CM

## 2017-04-15 DIAGNOSIS — K219 Gastro-esophageal reflux disease without esophagitis: Secondary | ICD-10-CM

## 2017-04-15 DIAGNOSIS — F1020 Alcohol dependence, uncomplicated: Secondary | ICD-10-CM | POA: Diagnosis not present

## 2017-04-15 DIAGNOSIS — F19980 Other psychoactive substance use, unspecified with psychoactive substance-induced anxiety disorder: Secondary | ICD-10-CM

## 2017-04-15 DIAGNOSIS — Z8673 Personal history of transient ischemic attack (TIA), and cerebral infarction without residual deficits: Secondary | ICD-10-CM

## 2017-04-15 DIAGNOSIS — Z8659 Personal history of other mental and behavioral disorders: Secondary | ICD-10-CM

## 2017-04-15 DIAGNOSIS — I10 Essential (primary) hypertension: Secondary | ICD-10-CM

## 2017-04-15 DIAGNOSIS — J432 Centrilobular emphysema: Secondary | ICD-10-CM

## 2017-04-15 DIAGNOSIS — Z79899 Other long term (current) drug therapy: Secondary | ICD-10-CM

## 2017-04-15 DIAGNOSIS — T148XXA Other injury of unspecified body region, initial encounter: Secondary | ICD-10-CM

## 2017-04-15 DIAGNOSIS — I2510 Atherosclerotic heart disease of native coronary artery without angina pectoris: Secondary | ICD-10-CM

## 2017-04-15 NOTE — Progress Notes (Signed)
Patient ID: Rebecca Gordon, female   DOB: 1955-12-16, 61 y.o.   MRN: [id number]  Pt seen for Genesight testing FU.She is doing well in CD IOP maintaining abstinence and learning to manage her disease Current Zoloft rx is Use As Directed category (no genetic impact) She wants to taper as she says she feels after 20 yrs drug is not needed.She suspects it may actually be dulling her ability to feel. Discussed taper plan-she has reduced dose before too rapidly and experienced vertogo. She will taper slowly over 3 -4 week period and let us know if she has any problems,

## 2017-04-15 NOTE — Progress Notes (Signed)
    Daily Group Progress Note  Program: CD-IOP   04/15/2017 Rebecca Gordon [id number]  Diagnosis: F10.20 Alcohol Use Disorder, Severe   Sobriety Date:03/30/17  Group Time: 1-2:30pm  Participation Level: Active  Behavioral Response: Appropriate and Sharing  Type of Therapy: Process Group  Interventions: Supportive  Topic: Process: The first half of group was spent in process. Members shared about the past holiday weekend and any challenges or successes they had experienced since we last met. A new group member was present and the member missing from all last week appeared today. Drug tests were collected from everyone present. A PHQ-9 and GAD-7 were both administered and collected from members.  Group Time: 2:30-4pm  Participation Level: Active  Behavioral Response: Sharing  Type of Therapy: Psycho-education Group  Interventions: Family Systems  Topic: Psycho-Ed: 'Families in early recovery'. The second session began with the new group member introducing herself. She described a difficult and painful path of residential treatment, sobriety and then relapse. She shared about a complicated childhood with an alcoholic mother and her nuclear family, which includes enmeshment, codependency and enabling. Whatever the role, everyone is very angry. These disclosures prompted other group members to share about their families and the problems they have faced in early recovery. counselors described some of the more common behaviors displayed by family in early recovery, but emphasized that each member focus on him/herself and address his/ her dysfunctional and unhealthy behaviors and patterns. The group session was intense, and members all seemed to have found the session very informative and compelling.  Summary: The patient was engaged and active in group today. She reported she had attended five 12-step meetings since we last met. The patient reported she had spoken at length with  both of her daughters and they were supportive about getting into treatment. The patient admitted her oldest cried, which surprised her. The patient noted this was my first sober Christmas in years and the girls, 'Were very happy'. The patient admitted she had a fleeting thought about drinking, but quickly distracted herself. When asked what she liked so much about drinking, the patient stated, "Damn, I miss that buzz", that comes with the first two glasses of wine. When asked how long that lasted over a typical night of drinking, she admitted it was very short-lived. In the psycho-ed, she responded to the new group member's feedback and shared about how she had been an only child and rebelled against her strict [employer] upbringing. The patient responded well to this intervention and appeared very committed to her recovery.   UDS collected: Yes Results: pending  AA/NA attended?: YesMonday, Wednesday, Thursday, Friday and Saturday  Sponsor?: No, but she is very new to [place] and will be securing at least a temporary in the near future   Brandon Melnick, [place] 04/15/2017 9:16 AM

## 2017-04-16 DIAGNOSIS — E041 Nontoxic single thyroid nodule: Secondary | ICD-10-CM

## 2017-04-16 HISTORY — DX: Nontoxic single thyroid nodule: E04.1

## 2017-04-17 ENCOUNTER — Other Ambulatory Visit (HOSPITAL_COMMUNITY): Payer: 59 | Attending: Psychiatry | Admitting: Psychology

## 2017-04-17 DIAGNOSIS — F19980 Other psychoactive substance use, unspecified with psychoactive substance-induced anxiety disorder: Secondary | ICD-10-CM

## 2017-04-17 DIAGNOSIS — G4700 Insomnia, unspecified: Secondary | ICD-10-CM | POA: Diagnosis not present

## 2017-04-17 DIAGNOSIS — F1020 Alcohol dependence, uncomplicated: Secondary | ICD-10-CM | POA: Insufficient documentation

## 2017-04-17 NOTE — Progress Notes (Signed)
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 The group had not met since last Thursday and there was a lot to disclose and discuss. One group member checked-in with today as her sobriety date. The process of her relapse was discussed, and new members educated about how one relapses before actually relapsing.  The member pointed out she wasn't beating herself up and felt as if it had reaffirmed her inability to manage alcohol in any manner. The medical director met with three group members, including the newest one during the session today.   Group Time: 2:30-4pm  Participation Level: Active  Behavioral Response: Sharing  Type of Therapy: Psycho-education Group  Interventions: Motivational Interviewing  Topic: Psycho-Ed: Mind-mapping; Having Fun. The second half of group was spent in a psycho-ed. Members were provided a handout asking them to identify how they experience 'fun'. Members brainstormed, and their answers were recorded on the board. They were able to identify many different activities, but across the board, members admitted these had been pushed to the side as a result of their addiction. The psych-ed concluded with members identifying at least two activities they would  like to focus on and develop further in the new year. The session ended with members sharing about "New Year's" plans and everyone appeared prepared to get through this final ritual this evening.   Summary: The patient reported she is doing well. Her husband is attending Al-Anon and has changed his language a bit that reflects this recovery-based group. She reported she is on 'Day 17' and admitted she is 'still feeling foggy upstairs'. This provided an opportunity to remind the group about "PAWS" and the symptoms so common in early recovery. The patient reported it has been easier staying sober wit her husband at home, but noted, 'he will be traveling next week'. The patient reported she has written out a schedule, so she is accountable and has a plan. She described attending some evening meetings and addressing the most triggering times which are in the evening - when she drank. The patient met briefly with the medical director during the process session to review the findings of her genetic testing. In the psycho-ed, the patient identified: eating out, walking the dog, music, vacuuming, and organizing my home. She admitted, when questioned, that she did not attend to these things when she was drinking. The patient reported she would focus on reading, exercise and volunteering in the community to contribute and developing ways of fulfillment and having things to do. The patient is making progress and doing what we have asked her to do in very early recovery. She responded well to this intervention.     UDS collected: No Results:   AA/NA attended?: YesMonday, Friday and Sunday  Sponsor?: No   Brandon Melnick, LCAS 04/17/2017 8:26 AM

## 2017-04-18 ENCOUNTER — Other Ambulatory Visit (HOSPITAL_COMMUNITY): Payer: 59 | Admitting: Psychology

## 2017-04-18 DIAGNOSIS — F1020 Alcohol dependence, uncomplicated: Secondary | ICD-10-CM

## 2017-04-19 ENCOUNTER — Encounter (HOSPITAL_COMMUNITY): Payer: Self-pay | Admitting: Psychology

## 2017-04-19 NOTE — Progress Notes (Signed)
    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 Pts were directed to discuss their tx goals. Pts shared about topics of recovery, 12 steps, and sobriety. COunselor led a 10 min mindful breathing script which helped pts focus on their breath and process the experience.      Group Time: 2:30-4  Participation Level: Active  Behavioral Response: Appropriate and Sharing  Type of Therapy: Psycho-education Group  Interventions: CBT and Supportive  Topic: Pts were active and engaged in psychoeducation session in which counselor led pts through a handout goal setting, SMART goals, and creating action towards reaching goals.     Summary: Pt was active and engaged in session and reported she did not attend any AA meetings since Monday. She continues to sleep well and spend time daily to herself reading big book material. Pt reported on her husbands upcoming business trip which will leave her home alone for first time since getting sober. She states she is "nervous" but has a plan to spend time w/ other women, go to [employer], and denies any desire or craving to drink. 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   UDS collected: No Results: negative  AA/NA attended?: No  Sponsor?: No   Wes Pearle Wandler, LPCA LCASA 04/19/2017 1:45 PM

## 2017-04-20 ENCOUNTER — Encounter (HOSPITAL_COMMUNITY): Payer: Self-pay | Admitting: Psychology

## 2017-04-20 NOTE — Progress Notes (Signed)
    Daily Group Progress Note  Program: CD-IOP   04/20/2017 Rebecca Gordon [id number]  Diagnosis: F10.20 Alcohol Use Disorder, Severe   Sobriety Date: 03/30/17  Group Time: 1-2:30pm  Participation Level: Active  Behavioral Response: Sharing  Type of Therapy: Process Group  Interventions: Supportive  Topic:Process: the first half of group was spent in process. Members shared about any issues or concerns that had presented themselves since we met yesterday afternoon. One member shared about her frustrations and difficulties with her 109 yo daughter and the problems with family members working in the business. Another member discussed the enmeshed relationship he has with his mother. He pointed out how some of her unhealthy patterns and behaviors were adopted by him at an early age and he has carried them into adulthood. Those patterns have been most obvious in his relationships. Three group members were absent today and while two were out on medical leave, the third group member did not call to explain her absence.    Group Time: 2:30-4pm  Participation Level: Active  Behavioral Response: Appropriate  Type of Therapy: Psycho-education Group  Interventions: Supportive  Topic: Psycho-Education: Agricultural consultant. A former group member with over a year of sobriety came to visit the group and share about her life. While she briefly described her life in active addiction, most of her talk dealt with the things she has embraced and behaviors she has practiced in recovery. She noted she was sharing the ugly dishonest and embarrassing things that she had done in her active addiction to keep herself reminded of those things, but to let others know they are not alone and need not be ashamed. 'There is no judgement', she reminded them. The speaker told a wonderful story of her life and the group responded favorably to her honesty, gratitude and acceptance.    Summary: The patient was attentive  and engaged in group today. She reported that she had shared this information with her counselor in their session this morning. The patient explained that she had been experiencing these rather severe hunger pains in her stomach in the latter part of the afternoon. It felt like she had not eaten at all, but she had been eating. It was confusing. The patient reported she had realized earlier this morning, that those were cravings and her body was anticipating the alcohol, which was something that typically arrived in the latter part of the afternoon. It took some time to piece it together, but she recognized this. In the psycho-ed, she had welcomed the guest speaker. She had met with her, at this counselor's request, after our first meeting. The patient displayed good insight and is making progress in early recovery. She responded well to this intervention.    UDS collected: No  AA/NA attended?: No  Sponsor?: No   Brandon Melnick, LCAS 04/20/2017 6:09 PM

## 2017-04-22 ENCOUNTER — Other Ambulatory Visit (HOSPITAL_COMMUNITY): Payer: 59 | Admitting: Psychology

## 2017-04-22 DIAGNOSIS — F1020 Alcohol dependence, uncomplicated: Secondary | ICD-10-CM

## 2017-04-24 ENCOUNTER — Encounter (HOSPITAL_COMMUNITY): Payer: Self-pay | Admitting: Psychology

## 2017-04-24 ENCOUNTER — Telehealth (HOSPITAL_COMMUNITY): Payer: Self-pay | Admitting: Psychology

## 2017-04-24 ENCOUNTER — Other Ambulatory Visit (HOSPITAL_COMMUNITY): Payer: 59

## 2017-04-24 NOTE — Progress Notes (Signed)
    Daily Group Progress Note  Program: CD-IOP   04/24/2017 Rebecca Gordon [id number]  Diagnosis:  Alcohol use disorder, severe, dependence ([place])   Sobriety Date: 12/15  Group Time: 1-2:30  Participation Level: Active  Behavioral Response: Appropriate and Sharing  Type of Therapy: Process Group  Interventions: CBT  Topic: Pts were active and engaged in process session in which pts shared about their weeks and utilization of coping skills. Pts were directed to discuss their tx goals. Pts shared about topics of recovery, 12 steps, and sobriety.       Group Time: 2:30-4  Participation Level: Active  Behavioral Response: Appropriate and Sharing  Type of Therapy: Psycho-education Group  Interventions: Other: Chair Yoga  Topic: Pts were active and engaged in psychoeducation session in which a guest counselor led a 1 hr therapeutic "chair yoga" session w/ instruction for how to implement practice into daily routines.    Summary: Pt was active and engaged in session. She consitently presents as upbeat, well groomed, and gives and provides helpful feedback in group. She displays a growing insight and awareness of her addiction and recovery. She reported she attended 2 AA meetings over the weekend. She states her weekend was "a bit of a challenge" since her husband is away on extended business and she is home alone for first time since entering tx. Pt reported her cravings were a 5 on a 10 point scale w/ 10 being overwhelming. Pt stated she got a massage which was therapeutic and a form of self care. Pt participated actively in group yoga despite having knee problems.   UDS collected: Yes Results: pending  AA/NA attended?: YesSaturday and Sunday  Sponsor?: No  Wes Swan, LPCA LCASA 04/24/2017 11:18 AM

## 2017-04-24 NOTE — Progress Notes (Signed)
Rebecca Gordon is a 62 y.o. female patient. CD-IOP. Excused Absence. The patient appeared for group today, but reported her stomach was upset. She reported the medicine she is taking, Baclofen, seems to be causing her stomach upset. It has been a problem since she began taking it, but only today did she share about this. After two trips to the bathroom, the patient stated she felt badly and was going home. She will be excused from group today.         Brandon Melnick, LCAS

## 2017-04-25 ENCOUNTER — Other Ambulatory Visit (HOSPITAL_COMMUNITY): Payer: 59 | Admitting: Psychology

## 2017-04-25 DIAGNOSIS — F1020 Alcohol dependence, uncomplicated: Secondary | ICD-10-CM | POA: Diagnosis not present

## 2017-04-29 ENCOUNTER — Other Ambulatory Visit (HOSPITAL_COMMUNITY): Payer: Self-pay

## 2017-04-29 ENCOUNTER — Other Ambulatory Visit (HOSPITAL_COMMUNITY): Payer: 59 | Admitting: Psychology

## 2017-04-29 DIAGNOSIS — F1020 Alcohol dependence, uncomplicated: Secondary | ICD-10-CM

## 2017-04-29 MED ORDER — BACLOFEN 10 MG PO TABS: 10.0000 mg | ORAL_TABLET | Freq: Three times a day (TID) | ORAL | 0 refills | Status: DC

## 2017-04-30 ENCOUNTER — Encounter (HOSPITAL_COMMUNITY): Payer: Self-pay | Admitting: Psychology

## 2017-04-30 NOTE — Progress Notes (Signed)
    Daily Group Progress Note  Program: CD-IOP   04/30/2017 Rebecca Gordon [id number]  Diagnosis:  Alcohol Use Disorder, Severe  Sobriety Date: 03/30/17  Group Time: 1-2:30pm  Participation Level: Active  Behavioral Response: Appropriate and Sharing  Type of Therapy: Process Group  Interventions: Supportive  Topic: Process/Graduation: the first half of group began with process and ended with a graduation ceremony. One member had to leave at the break so the group agreed to hold the ceremony before she had to leave. Members shared about any challenges or temptations they may have faced in early recovery. As the process neared an end, a graduation ceremony was held complete with brownies and the passing of the medallion. There were kind words of hope and encouragement shared with the graduating member.  Group Time: 2:30-4pm  Participation Level: Active  Behavioral Response: Sharing  Type of Therapy: Psycho-education Group  Interventions: Strength-based  Topic: Psycho-ed: Popsicle Sticks; the second half of group was spent in a psycho-ed. As is typical on Thursdays, members drew from the group of popsicle sticks with each having a word or phrase written on it. The terms related to recovery and members were asked to share what they mean to them. There was a lively discussion with feedback and insight gleaned from the group. The session ended with good-byes to the graduating member and plans for the upcoming weekend.   Summary: The patient reported she is feeling better today. She had left group early in the session yesterday with an upset stomach. She attributed it to the anti-craving medication she has been taking. While no one else has had any problems with Baclofen, this patient admitted it has been upsetting to her stomach from the first time she began taking it. The patient shared that she really enjoys the [place] meeting on Saturday mornings at 8 am. She noted that she  will reach her 30 days on Sunday but doesn't usually go to any meetings that day. The patient was encouraged to attend the 4 pm Women's AA meeting at the [facility]. They have a speaker and there are lots of supportive women for her to meet. She agreed she would consider that. The patient was very hopeful for the graduating member and wished her well during the graduation ceremony. In the psycho-ed, the patient's phrase was "honesty in recovery". She understands that it is an essential part of sobriety, but the patient admitted she is questioning things. "I am still trying to wrap myself around this belief that I am an alcoholic". She noted, "on TV, everyone is drinking".  The counselor noted that she would give her the 'Step One' worksheets and that may help her get a clearer picture of her condition. The patient welcomed this news and agreed she would begin to work on this assignment. She remains compliant and open to suggestions and feedback. However, it is clear she has second thoughts about whether she is an alcoholic and if these things apply to her. The patient responded well to this intervention.    UDS collected: No  AA/NA attended? No  Sponsor?: No   Rebecca Gordon, LCAS 04/30/2017 1:01 PM

## 2017-05-01 ENCOUNTER — Encounter: Payer: Self-pay | Admitting: Psychiatry

## 2017-05-01 ENCOUNTER — Encounter (HOSPITAL_COMMUNITY): Payer: Self-pay

## 2017-05-01 ENCOUNTER — Other Ambulatory Visit (INDEPENDENT_AMBULATORY_CARE_PROVIDER_SITE_OTHER): Payer: 59 | Admitting: Psychology

## 2017-05-01 DIAGNOSIS — F1020 Alcohol dependence, uncomplicated: Secondary | ICD-10-CM | POA: Diagnosis not present

## 2017-05-01 DIAGNOSIS — X503XXA Overexertion from repetitive movements, initial encounter: Secondary | ICD-10-CM

## 2017-05-01 DIAGNOSIS — F17200 Nicotine dependence, unspecified, uncomplicated: Secondary | ICD-10-CM

## 2017-05-01 DIAGNOSIS — Z79899 Other long term (current) drug therapy: Secondary | ICD-10-CM

## 2017-05-01 DIAGNOSIS — I2510 Atherosclerotic heart disease of native coronary artery without angina pectoris: Secondary | ICD-10-CM

## 2017-05-01 DIAGNOSIS — T148XXA Other injury of unspecified body region, initial encounter: Secondary | ICD-10-CM

## 2017-05-01 DIAGNOSIS — J432 Centrilobular emphysema: Secondary | ICD-10-CM

## 2017-05-01 DIAGNOSIS — F19980 Other psychoactive substance use, unspecified with psychoactive substance-induced anxiety disorder: Secondary | ICD-10-CM

## 2017-05-01 DIAGNOSIS — I10 Essential (primary) hypertension: Secondary | ICD-10-CM

## 2017-05-01 MED ORDER — TOPIRAMATE 100 MG PO TABS: 100.0000 mg | ORAL_TABLET | Freq: Every day | ORAL | 0 refills | Status: DC

## 2017-05-01 NOTE — Progress Notes (Signed)
Patient ID: Rebecca Gordon, female   DOB: 1956-02-02, 62 y.o.   MRN: [id number] Pt seen for FU on discontinuation of Zoloft and GI intolerance to Baclofen. Reports triggered thoughts of drinking but no pure cravings.She says she has thoughts in situations she used to turn to the bottlAt team meeting counselors reported pt had been having thougts she might not be alcoholic but pt denies this. She had GI symptoms of upset and loud hypermotility on Baclofen. Earlier trial of Topomax for seizure prevention was well tolerated after she experienced somnolence on Neurontin. PHQ 9 screen is negative as GAD 7

## 2017-05-02 ENCOUNTER — Other Ambulatory Visit (HOSPITAL_COMMUNITY): Payer: 59 | Admitting: Psychology

## 2017-05-02 ENCOUNTER — Encounter (HOSPITAL_COMMUNITY): Payer: Self-pay | Admitting: Psychology

## 2017-05-02 DIAGNOSIS — F1020 Alcohol dependence, uncomplicated: Secondary | ICD-10-CM

## 2017-05-02 NOTE — Progress Notes (Signed)
    Daily Group Progress Note  Program: CD-IOP   05/02/2017 Alejandro Mulling [id number]  Diagnosis:  Alcohol use disorder, severe, dependence ([place])   Sobriety Date: 12/15  Group Time: 1-2:30  Participation Level: Active  Behavioral Response: Appropriate and Sharing  Type of Therapy: Process Group  Interventions: CBT  Topic: Pts were active and engaged in process session in which pts shared about their weeks and utilization of coping skills. Pts were directed to discuss their tx goals. Pts shared about topics of recovery, 12 steps, and sobriety.       Group Time: 2:30-4  Participation Level: Active  Behavioral Response: Appropriate and Sharing  Type of Therapy: Psycho-education Group  Interventions: CBT and Family Systems  Topic: Pts were active and engaged in psychoeducation session on dysfunctional family systems, adult children of alcoholics symtpoms, and strategies for challenging "shoulds" that are incongruent w/ current values.     Summary: Pt was active and engaged in group. She reported attending 2 AA meetings over th eweekend. She reported she spent time w/ another group member, helping her get to a meeting. Pt states she got a sponsor and is also happy to have reached 30 days of sobriety. Pt reported on managing stress from her daughter's wedding planning. Pt states her husband attended an Al-Anon meeting over the weekend. Pt appears to be actively working her tx goals and gaining insight into her addicted brain. Pt shared openly about her childhood growing up in a "severely religious family" in which she was "expected to be the perfect Christian girl". Pt identified the ways that her perfectionism from childhood has stayed w/ her into adulthood. Pt offered helpful linking feedback to another member who grew up in an "overly religious" household.   UDS collected: Yes Results: pending  AA/NA attended?: YesSaturday and Sunday  Sponsor?: Yes   Youlanda Roys, LPCA  LCASA 05/02/2017 9:50 AM

## 2017-05-03 NOTE — Progress Notes (Signed)
Daily Group Progress Note  Program: CD-IOP   05/03/2017 Rebecca Gordon [id number]  Diagnosis:  Alcohol use disorder, severe, dependence (HCC)  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HCC)  Encounter for medication management  Cumulative trauma disorder  Essential hypertension  Current smoker  Centrilobular emphysema (HCC)  Arteriosclerotic cardiovascular disease (ASCVD)   Sobriety Date: 03/30/17  Group Time: 1-2:30pm  Participation Level: Active  Behavioral Response: Appropriate and Sharing  Type of Therapy: Process Group  Interventions: Supportive  Topic: Process: the first half of group was spent in process. Members shared about the experiences they had had since we last met and any temptations or struggles in early recovery. They also identified any successes or 'shining moments' they had experienced. A new group member was present, and she introduced herself to the group. She received a warm welcome. Random drug tests were collected from three members. The program director met with one group member.  Group Time: 2:30-4pm  Participation Level: Active  Behavioral Response: Appropriate  Type of Therapy: Psycho-education Group  Interventions: Family Systems  Topic: Psycho-Ed: Guest Speaker; Chaplain. The second half of group was spent in a psycho-ed. A monthly visitor, Bruce Messenger, a chaplain with [facility], appeared as expected. He shared about himself and explained about families and our early experiences in shaping later behaviors and beliefs. Members were asked to share some of their more important memories of their family and what they might have come away with due to those early lessons.   Summary: The patient reported she had picked up her 30-day chip on Monday evening. The group applauded this news. The patient agreed that she had received a lot of hugs and validation for this accomplishment. The patient reported there were two important people in  her life that she had not told about losing her job and she had finally spoken with one of them. Her friend was very understanding and supportive and it had felt very good. In the psycho-ed, the patient identified her early family life as having many rules and 'I broke them all'. She explained that 'I never wanted for anything', but she was rebellious and challenged her religious and conservative parents. When questioned by the chaplain, the patient was confused about his question, while others might have felt she was avoiding or defensive. The patient was engaged and active in group today but was unable to respond to the chaplain's question as the session came to an end.     UDS collected: No Results:  AA/NA attended?: YesMonday  Sponsor?: Yes   Ann Evans, LCAS 05/03/2017 8:18 AM 

## 2017-05-04 ENCOUNTER — Encounter (HOSPITAL_COMMUNITY): Payer: Self-pay | Admitting: Psychology

## 2017-05-04 NOTE — Progress Notes (Signed)
    Daily Group Progress Note  Program: CD-IOP   05/04/2017 Rebecca Gordon [id number]  Diagnosis:  No diagnosis found.   Sobriety Date: 03/30/17  Group Time: 1-2:30pm  Participation Level: Active  Behavioral Response: Appropriate  Type of Therapy: Process  Interventions: Supportive  Topic: Patients were active and engaged in process session, in which patients shared about coping skills, challenges and reactions to the previous day's speaker.  Counselors facilitated group processing around recovery, sobriety, emotions, grief and relationships.  Group Time: 2:30-4pm  Participation Level: Active  Behavioral Response: Sharing  Type of Therapy: Psycho-Education  Interventions: Self-Care   Topic: Patients were engaged in a 1 hr psychoeducation session in which guest speaker from [place], Frederich Balding, presented about healthy nutrition, sleep and exercise habits.  Summary: Patient was active and engaged in session.  She could related to another group member surrounding healthy life changes.  Patient shared that she makes it a point not to go down the wine aisle at the grocery store and parks at a different place in the parking lot, to avoid old behavioral patterns.  The patient demonstrated resistance when asked about "what she is missing in life".  Counselor used immediacy to bring attention to patient's feelings and encouraged her to practice checking in with herself about her thoughts and feelings. The patient reported, "I am being more assertive" and shared about an experience wherein she communicated assertively and appropriately with her dentist. The patient was attentive in the psycho-ed and provided feedback about her own efforts at fitness. She displayed good insight.    UDS collected: No  AA/NA attended?: No  Sponsor?: Yes   Brandon Melnick, LCAS 05/04/2017 1:36 PM

## 2017-05-06 ENCOUNTER — Other Ambulatory Visit (HOSPITAL_COMMUNITY): Payer: 59 | Admitting: Psychology

## 2017-05-06 DIAGNOSIS — F19980 Other psychoactive substance use, unspecified with psychoactive substance-induced anxiety disorder: Secondary | ICD-10-CM

## 2017-05-06 DIAGNOSIS — F1020 Alcohol dependence, uncomplicated: Secondary | ICD-10-CM | POA: Diagnosis not present

## 2017-05-08 ENCOUNTER — Other Ambulatory Visit (HOSPITAL_COMMUNITY): Payer: 59 | Admitting: Psychology

## 2017-05-08 ENCOUNTER — Encounter: Payer: Self-pay | Admitting: Psychiatry

## 2017-05-08 DIAGNOSIS — F1020 Alcohol dependence, uncomplicated: Secondary | ICD-10-CM

## 2017-05-09 ENCOUNTER — Other Ambulatory Visit (HOSPITAL_COMMUNITY): Payer: 59 | Admitting: Psychology

## 2017-05-09 ENCOUNTER — Other Ambulatory Visit: Payer: Self-pay | Admitting: Family Medicine

## 2017-05-09 DIAGNOSIS — F1020 Alcohol dependence, uncomplicated: Secondary | ICD-10-CM

## 2017-05-09 DIAGNOSIS — I10 Essential (primary) hypertension: Secondary | ICD-10-CM

## 2017-05-10 LAB — HM MAMMOGRAPHY

## 2017-05-11 ENCOUNTER — Encounter (HOSPITAL_COMMUNITY): Payer: Self-pay | Admitting: Psychology

## 2017-05-11 NOTE — Progress Notes (Signed)
Daily Group Progress Note  Program: CD-IOP   05/11/2017 Joanann Haefele [id number]  Diagnosis:  Alcohol use disorder severe  Sobriety Date: 03/30/17  Group Time: 1-2:30pm  Participation Level: Active  Behavioral Response: Sharing  Type of Therapy: Process Group  Interventions: Supportive  Topic: The first part of group was spent in process. Members identified any successes or challenges they had faced in early recovery. Included in their report were the number of recovery-based meetings they had attended since we last met. The medical director met with two group members during the session today. Four drug tests were collected.  Group Time: 2:30-4pm  Participation Level: Active  Behavioral Response: Appropriate  Type of Therapy: Psycho-ed  Interventions: Family Systems  Topic: Psycho-Ed: Codependency; Part 2. The second half of group consisted of a psycho-ed on codependency. Members were provided with a handout. The handout was read and discussed with members sharing about their patterns of behavior in relationships. It was an intense session with vulnerability displayed through members' disclosures.   Summary: The patient reported attending one meeting and talking with her p. The meeting topic as step four and she didn't feel like she benefitted much. She is still addressing step one. The patient admitted she is becoming more aware of her feelings. "I am experiencing some feelings that are new to me", which she doesn't necessarily enjoy. The patient was reminded that numbing one's self to her pain, also limits her ability to experience joy. In the psycho-ed, the patient was attentive, but appeared somewhat confused. The patient admitted she felt perplexed the disclosures from others on codependency. She didn't believe that she was codependent and had not displayed these types of patterns in her relationships, either in childhood or adulthood. She agreed that at times, she  had tried to please her parents, but countered those efforts also being very rebellious. The patient made some good comments and continues to make progress in early recovery.    UDS collected: Yes, pending  AA/NA attended?: Yes, Monday evening  Sponsor? Yes   Ann Evans, LCAS 05/11/2017 9:01 AM 

## 2017-05-12 ENCOUNTER — Encounter (HOSPITAL_COMMUNITY): Payer: Self-pay | Admitting: Psychology

## 2017-05-12 NOTE — Progress Notes (Signed)
    Daily Group Progress Note  Program: CD-IOP   05/12/2017 Rebecca Gordon [id number]  Diagnosis: F10.20 Alcohol use disorder, severe   Sobriety Date: 03/30/17  Group Time: 1-2:30pm  Participation Level: Active  Behavioral Response: Sharing  Type of Therapy: Process Group  Interventions: Supportive  Topic: Patients were active and engaged in process session.  Patients shared reflections about the previous session's discussion on codependency and family roles.  Patients shared accomplishments and challenges with relationships and recovery.  Group Time: 2:30-4pm  Participation Level: Active  Behavioral Response: Sharing  Type of Therapy: Psycho-Education  Interventions: Family Systems  Topic: Patients were active and engaged in psychoeducation session, in which counselors facilitated further discussion around codependency.  As a group, patients read "The Bridge," a brief story that presents an Warehouse manager of codependence.  They shared if and how they identified with the characters in the story and how they may implement new insights into their lives. It was a lively discussion with good refection and disclosure.  Summary: Patient was engaged in session.  She shared how the demands of the workplace eventually became her breaking point in alcoholism.  Patient was working well over 60 hours per week and found fulfillment in her job but also grew to be extremely stressed by it.  Counselors facilitated discussion around how she may prioritize her recovery when considering the hours and demands she will take on as she returns to work.  Patient said that she used to become a different person when she drank and even called that person by a different name-that person was sometimes "more fun" but also destructive.  Patient shared that her daughters have recently started calling her for advice, where they used to exclusively ask their father for input. This was because of her drinking and  they were also often upset with her because of things she said or did while intoxicated. She is very pleased and grateful that she is available for her daughters and their relationships are slowly repairing themselves.    UDS collected: No  AA/NA attended?: No  Sponsor?: Yes   Brandon Melnick, LCAS 05/12/2017 11:04 AM

## 2017-05-13 ENCOUNTER — Other Ambulatory Visit (HOSPITAL_COMMUNITY): Payer: 59 | Admitting: Psychology

## 2017-05-13 ENCOUNTER — Encounter: Payer: Self-pay | Admitting: Psychiatry

## 2017-05-13 ENCOUNTER — Encounter (HOSPITAL_COMMUNITY): Payer: Self-pay | Admitting: Psychology

## 2017-05-13 DIAGNOSIS — F1020 Alcohol dependence, uncomplicated: Secondary | ICD-10-CM | POA: Diagnosis not present

## 2017-05-13 DIAGNOSIS — F19980 Other psychoactive substance use, unspecified with psychoactive substance-induced anxiety disorder: Secondary | ICD-10-CM

## 2017-05-13 NOTE — Progress Notes (Signed)
    Daily Group Progress Note  Program: CD-IOP   05/13/2017 Alejandro Mulling [id number]  Diagnosis:  Alcohol use disorder, severe, dependence ([place])  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[place])   Sobriety Date: 12/15  Group Time: 1-2:30  Participation Level: Active  Behavioral Response: Appropriate and Sharing  Type of Therapy: Process Group  Interventions: CBT  Topic:  Pts were active and engaged in process session in which pts shared about their weeks and utilization of coping skills. Pts were directed to discuss their tx goals. Pts shared about topics of recovery, 12 steps, and sobriety.       Group Time: 2:30-4  Participation Level: Active  Behavioral Response: Appropriate and Sharing  Type of Therapy: Psycho-education Group  Interventions: CBT, Supportive, Family Systems and Other: ACOA Characteristics  Topic: Pts were active and engaged in psychoeducation session on Adult Children of Alcoholic traits, living as a truama survivor, and dysfunctional family sytems impact on development.    Summary: Pt was engaged in session and reported she has attended 1 AA meeting over the weekend. She denies cravings or unmanagable thoughts of using. She states she wants to go off her Sertraline and has planned to for many years. Pt discusses w/ group about her past anxiety and how she no longer has these sxs. Pt reported on an outing in which her and her daughter discussed wedding venues and were offered champagne which pt refused and said "was challenging" but felt good about.   UDS collected: Yes Results: negative  AA/NA attended?: YesSaturday  Sponsor?: No   Wes Swan, LPCA LCASA 05/13/2017 11:55 AM

## 2017-05-14 ENCOUNTER — Encounter (HOSPITAL_COMMUNITY): Payer: Self-pay | Admitting: Psychology

## 2017-05-14 NOTE — Progress Notes (Signed)
    Daily Group Progress Note  Program: CD-IOP   05/14/2017 Rebecca Gordon [id number]  Diagnosis:  Alcohol use disorder, severe, dependence ([place])  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[place])   Sobriety Date: 12/15  Group Time: 1-2:30  Participation Level: Active  Behavioral Response: Appropriate and Sharing  Type of Therapy: Process Group  Interventions: Strength-based and Supportive  Topic: Pts were active and engaged in process session in which pts shared about their weeks and utilization of coping skills. Pts were directed to discuss their tx goals. Pts shared about topics of recovery, 12 steps, and sobriety.       Group Time: 2:30-4  Participation Level: Active  Behavioral Response: Appropriate and Sharing  Type of Therapy: Psycho-education Group  Interventions: CBT and Meditation: Guided Imagery  Topic: Pts were active and engaged in psychoeducation session utilizing mindfulness through a guided imagery. Pts heard from 2 guest students who were in attendance that day.    Summary: Pt was active and engaged in group. She presented as upbeat and happy to report that she did not drink at a family social event this past weekend. She was "pissed that she is an alcoholic" and the event heightened her feelings of "otherness". She was able to drink "a lot of club soda" and felt supported by her family since no one got overly intoxicated. Counselor spent time going over how pt may have reacted had she drank at the event. Pt reported she went to 1 AA meeting over the weekend. Pt stated she watched "A Star is Born" and "Cried her eyes out". She was able to reflect to the group thoughtfully on the way the movie made her feel and new insights she gained into "emphathizing with instead of judging people who have addiction issues".    Pt continues to meet w/ her sponsor regularly.  UDS collected: Yes Results: Pending  AA/NA attended?: YesSunday  Sponsor?: Yes   Youlanda Roys,  LPCA LCASA 05/14/2017 4:03 PM

## 2017-05-15 ENCOUNTER — Encounter (HOSPITAL_COMMUNITY): Payer: Self-pay | Admitting: Medical

## 2017-05-15 ENCOUNTER — Other Ambulatory Visit (HOSPITAL_COMMUNITY): Payer: 59 | Admitting: Psychology

## 2017-05-15 DIAGNOSIS — I2510 Atherosclerotic heart disease of native coronary artery without angina pectoris: Secondary | ICD-10-CM

## 2017-05-15 DIAGNOSIS — F19980 Other psychoactive substance use, unspecified with psychoactive substance-induced anxiety disorder: Secondary | ICD-10-CM

## 2017-05-15 DIAGNOSIS — Z8659 Personal history of other mental and behavioral disorders: Secondary | ICD-10-CM

## 2017-05-15 DIAGNOSIS — F1020 Alcohol dependence, uncomplicated: Secondary | ICD-10-CM

## 2017-05-15 DIAGNOSIS — K219 Gastro-esophageal reflux disease without esophagitis: Secondary | ICD-10-CM

## 2017-05-15 DIAGNOSIS — J432 Centrilobular emphysema: Secondary | ICD-10-CM

## 2017-05-15 DIAGNOSIS — F10982 Alcohol use, unspecified with alcohol-induced sleep disorder: Secondary | ICD-10-CM

## 2017-05-15 DIAGNOSIS — I10 Essential (primary) hypertension: Secondary | ICD-10-CM

## 2017-05-15 DIAGNOSIS — Z8673 Personal history of transient ischemic attack (TIA), and cerebral infarction without residual deficits: Secondary | ICD-10-CM

## 2017-05-15 MED ORDER — TRAZODONE HCL 50 MG PO TABS: 50.0000 mg | ORAL_TABLET | Freq: Every evening | ORAL | 2 refills | Status: DC | PRN

## 2017-05-15 NOTE — Progress Notes (Signed)
Patient ID: Rebecca Gordon, female   DOB: May 24, 1955, 62 y.o.   MRN: [id number] Met with patient to answer questions about Topomax Rx . Prescribed for allcohol cravings in place of Baclofen she was unable to tolerate due to side effects. She was confused by package insert that said med was for seizure disorder and the fact that she tookGabapentinf or 10 days  To protect against withdrawal seizures.  Explained that drug has been found tobe effective for a number of ono seizyre conditiond including cravings for alcohol.and it was not prescribed to her for seizure/seizure prevention.  She c/o insomnia-? Related to fluid pill she takes at bedtime and hads to get up 2x /m nite?Wonders if there is anything she could take-Agreed to trial of Trazodone

## 2017-05-16 ENCOUNTER — Encounter (HOSPITAL_COMMUNITY): Payer: Self-pay | Admitting: Psychology

## 2017-05-16 ENCOUNTER — Other Ambulatory Visit (HOSPITAL_COMMUNITY): Payer: 59 | Admitting: Psychology

## 2017-05-16 DIAGNOSIS — F1020 Alcohol dependence, uncomplicated: Secondary | ICD-10-CM

## 2017-05-17 NOTE — Progress Notes (Signed)
Rebecca Gordon is a 62 y.o. female patient. CD-IOP: Treatment Plan Update. Met with the patient as scheduled this morning. I explained the importance of reviewing and updating her treatment plan. The patient was receptive. She has made significant progress in her goal of ongoing sobriety and has not used since prior to entering the program. She has done everything asked of her regarding building support and is attending at least four AA meetings per week, has secured a sponsor and is beginning to address step work. I wondered if she remembered what her third goal had been? The patient admitted she did not. At the time she first entered the program, she was still a bit confused and foggy. She had expressed the goal of building a structure or routine around her day. Today she shrugged and admitted she felt good about her daily schedule and built her day around meetings, group sessions or meeting with her sponsor. I pointed out how much more grounded and stable she is emotionally and physically today as compared to when she first entered the program. The patient agreed that she feels much better. I applauded her work in the group and encouraged her to continue to speak up about her feelings and any issues as she heads into the final third of the program. The patient noted that she has learned a lot and has grown considerably more empathetic towards others and the pain and struggles that many of those in the rooms have experienced. The treatment plan update was signed and completed. Our session ended and the patient headed out to lunch with plans to return to group in 90 minutes. The patient continues to make good progress in early recovery and her sobriety date remains 03/30/17.        Brandon Melnick, LCAS

## 2017-05-19 ENCOUNTER — Encounter (HOSPITAL_COMMUNITY): Payer: Self-pay | Admitting: Psychology

## 2017-05-19 NOTE — Progress Notes (Signed)
    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 Member shared about challenges (speed bumps) or successes (shining moments) in early recovery. The program director met with two group members during this session. Four random drug tests were collected.  Group Time: 2:30-4pm  Participation Level: Active  Behavioral Response: Appropriate and Sharing  Type of Therapy: Psycho-education Group  Interventions: Systems analyst  Topic: Psycho-Ed: Boundaries; Part 2. The psycho-ed was a continuation of the Monday session on boundaries. A handout was provided to group members identifying different kinds of boundaries, including porous versus rigid. Members took turns reading the handout and were able to identify what type of boundaries they typically and how they might change under certain circumstances. The session was lively with good disclosure among group members.    Summary: The patient reported she had attended two [facility] meetings. In one meeting, the topic had been Step 4 and the discussion had identified the loneliness in active addiction. This patient was able to see how she had shut out the rest of the world as her drinking increased. "Isolation was natural".  She admitted, "I turned my back on God". She admitted she felt very  certain that "someone was watching over me". The patient provided supportive feedback to another member and was more revealing and vulnerable than in previous sessions. In the psycho-ed, the patient laughed as someone described the difference in physical boundaries between the [place] and [place] parts of the country. People are much warier and more distant in the [place] than in [place], where there are so many more people. The patient continues to make measurable progress and responded well to this intervention.    UDS collected: No Results  AA/NA attended?: YesMonday and Tuesday  Sponsor?: Yes   Brandon Melnick, LCAS 05/19/2017 1:32 PM

## 2017-05-19 NOTE — Progress Notes (Signed)
    Daily Group Progress Note  Program: CD-IOP   05/19/2017 Rebecca Gordon [id number]  Diagnosis: F10.20  Alcohol Use Disorder, severe  Sobriety Date: 03/30/17  Group Time: 1-2:30pm  Participation Level: Active  Behavioral Response: Sharing  Type of Therapy: Process Group  Interventions: Supportive  Topic: Patients were active and engaged in process session.  Patients shared reflections about the previous session's discussion of boundaries.  Patients shared accomplishments and challenges with relationships and recovery.  Patients raised questions and shared experiences about sobriety, 12-Step Meetings and sponsors.  Counselors collected two UDS.  Group Time: 2:30-4pm  Participation Level: Active  Behavioral Response: Appropriate  Type of Therapy: Psycho-education Group  Interventions: Nurse, adult: Boundaries; Part 3/Graduation. Patients were active and engaged in psychoeducation session, in which counselors continued to facilitate discussion around the previous session's topic of boundaries.  Patients shared their "homework" assignment of identifying someone in their life who they struggle to set boundaries with and in what areas boundaries are rigid, porous or healthy.  The group also celebrated the graduation of a member who completed the program.  Summary: Patient was engaged in session.  Patient demonstrated support and concern for other group members and provided helpful feedback.  Patient shared that she met with her sponsor the previous evening.  Together, they reviewed the assignment her sponsor had provided, which was to write the story of her drinking.  Patient shared that her history of addiction was longer than she had initially realized, as she used to sneak pills from her parents when she was a teenager.  Patient demonstrated some resistance but was encouraged to reflect on the emotions that came up for her upon reviewing her history of using.   Patient said that she has more homework from her sponsor that she will be working on, as well. She was very emotional during the graduation ceremony. She spoke highly of the member who was graduating and wished him well. The patient responded well to this intervention.   UDS collected: No   AA/NA attended?: No  Sponsor?: Yes   Brandon Melnick, LCAS 05/19/2017 1:59 PM

## 2017-05-20 ENCOUNTER — Other Ambulatory Visit (HOSPITAL_COMMUNITY): Payer: 59 | Attending: Psychiatry | Admitting: Psychology

## 2017-05-20 ENCOUNTER — Encounter (HOSPITAL_COMMUNITY): Payer: Self-pay | Admitting: Psychology

## 2017-05-20 DIAGNOSIS — F19980 Other psychoactive substance use, unspecified with psychoactive substance-induced anxiety disorder: Secondary | ICD-10-CM

## 2017-05-20 DIAGNOSIS — I2510 Atherosclerotic heart disease of native coronary artery without angina pectoris: Secondary | ICD-10-CM | POA: Insufficient documentation

## 2017-05-20 DIAGNOSIS — F1020 Alcohol dependence, uncomplicated: Secondary | ICD-10-CM | POA: Diagnosis present

## 2017-05-20 DIAGNOSIS — Z8673 Personal history of transient ischemic attack (TIA), and cerebral infarction without residual deficits: Secondary | ICD-10-CM | POA: Diagnosis not present

## 2017-05-20 DIAGNOSIS — F418 Other specified anxiety disorders: Secondary | ICD-10-CM | POA: Diagnosis not present

## 2017-05-20 DIAGNOSIS — J432 Centrilobular emphysema: Secondary | ICD-10-CM | POA: Diagnosis not present

## 2017-05-20 DIAGNOSIS — I10 Essential (primary) hypertension: Secondary | ICD-10-CM | POA: Diagnosis not present

## 2017-05-20 DIAGNOSIS — F10282 Alcohol dependence with alcohol-induced sleep disorder: Secondary | ICD-10-CM | POA: Diagnosis not present

## 2017-05-20 NOTE — Progress Notes (Signed)
    Daily Group Progress Note  Program: CD-IOP   05/20/2017 Rebecca Gordon [id number]  Diagnosis:  Alcohol use disorder, severe, dependence ([place])  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[place])   Sobriety Date: 12/15  Group Time: 1-2:30  Participation Level: Active  Behavioral Response: Appropriate and Sharing  Type of Therapy: Process Group  Interventions: CBT and Reframing  Topic: Pts were active and engaged in process session in which pts shared about their weeks and utilization of coping skills. Pts were directed to discuss their tx goals. Pts shared about topics of recovery, 12 steps, and sobriety.       Group Time: 2:30-4  Participation Level: Active  Behavioral Response: Appropriate and Sharing  Type of Therapy: Psycho-education Group  Interventions: Other: Chair Yoga  Topic: Pts were active and engaged in psychoeducation session of 1 hr of Chair Yoga led by Jan Fireman, LPC. Pts were instructed in breathing techniques, mediation, and body awareness and discussed the benefits of yoga on trauma and addiction.    Summary: Pt was active and mildly engaged in session. She notably waited until near end of session to check in which is highly unusual for her. She reported she had a difficult weekend and felt "like shit" on Saturday, though she denies any desire to drink. Pt stated "[name]". Pt commented that "all her old friends drank" and she is having difficulty adjusting to her "hermit" life. Counselor reflected that pt was active in [place] and pt quickly snapped "[name]". Counselor discussed PAWS symptoms and "pink cloud" syndrome. She reported she attended 2 AA meetings over the weekend. She stated she "balled when I took the trash out and it spilled all over the floor". Her husband advised her to call her sponsor. Pt spoke about her shock of hearing a fellow AA member dying from an  apparent blood clot last week.    UDS collected: No Results: Pending  AA/NA attended?: YesSaturday and Sunday  Sponsor?: Yes   Youlanda Roys, LPCA LCASA 05/20/2017 5:12 PM

## 2017-05-22 ENCOUNTER — Other Ambulatory Visit (HOSPITAL_COMMUNITY): Payer: 59 | Admitting: Psychology

## 2017-05-22 ENCOUNTER — Encounter (HOSPITAL_COMMUNITY): Payer: Self-pay | Admitting: Psychology

## 2017-05-22 DIAGNOSIS — F10282 Alcohol dependence with alcohol-induced sleep disorder: Secondary | ICD-10-CM | POA: Diagnosis not present

## 2017-05-22 DIAGNOSIS — F1020 Alcohol dependence, uncomplicated: Secondary | ICD-10-CM

## 2017-05-22 NOTE — Progress Notes (Signed)
    Daily Group Progress Note  Program: CD-IOP   05/22/2017 Alejandro Mulling [id number]  Diagnosis:  F10.20 Alcohol Use Disorder, severe  Sobriety Date: 03/30/17  Group Time: 1-2:30pm  Participation Level: Active  Behavioral Response: Appropriate and Sharing  Type of Therapy: Process Group  Interventions: Supportive  Topic: Process: the first half of group was spent in process. Members identified the number of 12-step or recovery-based meetings they had attended along with any speed bumps or shining moments in early recovery. The medical director met with one patient for a medication check. Two random drug tests were collected today.  Group Time: 2:30-4pm  Participation Level: Active  Behavioral Response: Appropriate  Type of Therapy: Psycho-education Group  Interventions: Assertiveness Training  Topic: Psycho-Ed: Meditation/Communication; Passive, Aggressive and Assertive. The second half of group began with a short guided medication from [facility]. It identified phrases to say to oneself while breathing in and breathing out. Members identified how the breath work had felt for them. The psycho-ed began with a review of recent topics in group and the natural move into learning about Communication. A handout was provided identifying the three major styles of communication. Members took turns reading about the different styles and identifying what type of style they most often used. The discussion included a lengthy review of passive communication with members identifying how or why they had learned this style, what it got them and/or what they avoided by being passive. The discussion was lively with interesting and new disclosures from members.   Summary: The patient reported she had taken the new sleep medicine prescribed by the medical director and stated, "That's good stuff". She reported she had slept through the night and was very pleased with this new development. The  patient reported she had been very irritable with her husband on the phone earlier today. She had gone into his office and couldn't believe how messy it was. She had told him as much. The counselor suggested that perhaps she was beginning to experience her feelings after years of medicating with large quantities of alcohol. The patient reported she is "pissed that I am an alcoholic". The counselor suggested that perhaps she is becoming more aware of her inner self and this awareness, in itself, indicates growth and change. This generated some good feedback from another member who has been in the rooms much longer than this patient. She suggested that perhaps she hadn't had enough time in sobriety to appreciate how nice it is to feel good and not regret the previous evening and things you might have said. The patient reported her husband is out of town this week and next and she has plans for every evening, including meetings or talking with her sponsor. In the psycho-ed, the patient reported that she was rarely passive. She explained she was typically only passive if she had drunk too much and decided to just shut up and not say anything to upset her husband. The patient was engaged and attentive in group and responded well to this intervention.    UDS collected: No Results:  AA/NA attended?: YesMonday and Tuesday  Sponsor?: Yes   Brandon Melnick, LCAS 05/22/2017 5:20 PM

## 2017-05-23 ENCOUNTER — Encounter: Payer: Self-pay | Admitting: Nurse Practitioner

## 2017-05-23 ENCOUNTER — Other Ambulatory Visit (HOSPITAL_COMMUNITY): Payer: 59 | Admitting: Psychology

## 2017-05-23 DIAGNOSIS — F10282 Alcohol dependence with alcohol-induced sleep disorder: Secondary | ICD-10-CM | POA: Diagnosis not present

## 2017-05-23 DIAGNOSIS — F1020 Alcohol dependence, uncomplicated: Secondary | ICD-10-CM

## 2017-05-27 ENCOUNTER — Other Ambulatory Visit (HOSPITAL_COMMUNITY): Payer: 59 | Admitting: Psychology

## 2017-05-27 ENCOUNTER — Encounter (HOSPITAL_COMMUNITY): Payer: Self-pay | Admitting: Psychology

## 2017-05-27 DIAGNOSIS — F19980 Other psychoactive substance use, unspecified with psychoactive substance-induced anxiety disorder: Secondary | ICD-10-CM

## 2017-05-27 DIAGNOSIS — F1020 Alcohol dependence, uncomplicated: Secondary | ICD-10-CM

## 2017-05-27 DIAGNOSIS — F10282 Alcohol dependence with alcohol-induced sleep disorder: Secondary | ICD-10-CM | POA: Diagnosis not present

## 2017-05-27 NOTE — Progress Notes (Signed)
    Daily Group Progress Note  Program: CD-IOP   05/27/2017 Rebecca Gordon [id number]  Diagnosis: F10.20 Alcohol Use Disorder, severe  Sobriety Date: 03/30/17  Group Time: 1-2:30pm  Participation Level: Active  Behavioral Response: Appropriate  Type of Therapy: Process Group  Interventions: Supportive  Topic: Patients were active and engaged in process session.  The group welcomed a new member.  Patients shared accomplishments and challenges with relationships and recovery.  Several patients shared ways that they practiced assertive communication since the previous group session.  Counselors collected two UDS.  Group Time: 2:30-4pm  Participation Level: Active  Behavioral Response: Sharing  Type of Therapy: Psycho-education Group  Interventions: Assertiveness Training  Topic: Patients were active and engaged in psychoeducation session, in which counselors facilitated further discussion around passive, aggressive and assertive communication.  Counselors provided example scenarios wherein patients practiced responding assertively.  Counselor also led patients in guided meditation.  Patients responded well to communication psychoeducation and cited many examples from their personal lives.  Summary: Patient was engaged in session.  Patient reported having a good one-on-one meeting with her sponsor.  Patient shared that her husband has been experiencing anger since attending Al-Anon meetings.  Patient said that her husband is frustrated that he has no control over her drinking and angry that it took losing her job for her to finally seek help.  Patient said she and her husband are in a good routine right now where he is on the road a lot and she is keeping busy most nights of the week.  Counselor posed question to patient about closeness with her husband.  She struggled with this question, uncertain of what he really meant by 'closeness'. The patient continues to make measurable  progress in early recovery. She responded well to questions and interventions presented by counselors.  UDS collected: No Results:   AA/NA attended?: No  Sponsor?: Yes   Brandon Melnick, LCAS 05/27/2017 5:11 PM

## 2017-05-29 ENCOUNTER — Encounter (HOSPITAL_COMMUNITY): Payer: Self-pay | Admitting: Psychology

## 2017-05-29 ENCOUNTER — Other Ambulatory Visit (HOSPITAL_COMMUNITY): Payer: 59 | Admitting: Psychology

## 2017-05-29 DIAGNOSIS — M179 Osteoarthritis of knee, unspecified: Secondary | ICD-10-CM | POA: Insufficient documentation

## 2017-05-29 DIAGNOSIS — M1710 Unilateral primary osteoarthritis, unspecified knee: Secondary | ICD-10-CM | POA: Insufficient documentation

## 2017-05-29 DIAGNOSIS — F10282 Alcohol dependence with alcohol-induced sleep disorder: Secondary | ICD-10-CM | POA: Diagnosis not present

## 2017-05-29 DIAGNOSIS — F1020 Alcohol dependence, uncomplicated: Secondary | ICD-10-CM

## 2017-05-29 NOTE — Progress Notes (Signed)
    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 Counselor helped pts gain insight into their addictive patterns, triggers to use, and gain skills in relapse prevention. Pts were encouraged to discuss their experience in support groups and their progress towards their individualized treatment goals.      Group Time: 2:30-4  Participation Level: Active  Behavioral Response: Appropriate and Sharing  Type of Therapy: Psycho-education Group  Interventions: CBT, Psychosocial Skills: Self Compassion and Supportive  Topic: Patients were active and engaged in psychoeducation session. COunselor showed a 44min TED talk by Cristie Hem on "The Power of Self Compassion". Pts then discussed their reactions to the video. Counselor helped pts practice being self compassionate towards their self-criticisms. Some pts struggle to avoid comparing their situation to a more negative situation. Counselor provided feedback for how to use "I statements" and "Treat yourself w/ kindness".     Summary: Pt was active and engaged in group. She presented as upbeat and was open to giving and receiving feedback from group. She continues to deny overwhelming cravings or thoughts to use. She continues to meet w/ her sponsor and attend AA regularly. She was both curious and actively participated in group discussion on self compassion. Pt struggled to understand precise meaning of self compassion but was open when counselor gave her a deeper understanding.    UDS collected: No Results:   AA/NA attended?:  YesSaturday and Sunday  Sponsor?: Yes   Youlanda Roys, LPCA LCASA 05/29/2017 4:32 PM

## 2017-05-30 ENCOUNTER — Encounter (HOSPITAL_COMMUNITY): Payer: Self-pay | Admitting: Psychology

## 2017-05-30 ENCOUNTER — Other Ambulatory Visit (HOSPITAL_COMMUNITY): Payer: 59 | Admitting: Psychology

## 2017-05-30 DIAGNOSIS — F1020 Alcohol dependence, uncomplicated: Secondary | ICD-10-CM

## 2017-05-30 DIAGNOSIS — F10282 Alcohol dependence with alcohol-induced sleep disorder: Secondary | ICD-10-CM | POA: Diagnosis not present

## 2017-05-30 NOTE — Progress Notes (Signed)
    Daily Group Progress Note  Program: CD-IOP  05/29/17 Rebecca Gordon [id number]  Diagnosis: F10.20 Alcohol Use Disorder, Severe  Sobriety Date: 03/30/17  Group Time: 1-2:30pm  Participation Level: Active  Behavioral Response: Appropriate  Type of Therapy: Process Group  Interventions: Supportive  Topic: Process: The first part of group was spent in process. Members shared about the things they have done to support their sobriety since we last met. They identified any 'shining moments' or 'speed bumps'. The medical director met with one group member for a medication check. Two random drug tests were collected during today's session.   Group Time: 2:30-4pm  Participation Level: Active  Behavioral Response: Appropriate  Type of Therapy: Psycho-education Group  Interventions: Medication Management  Topic: Psycho-Ed: Pharmacist and Medications. The psycho-ed included a visiting pharmacist. EP, from [facility], spoke on the different categories of medications most commonly prescribed for people in early recovery. She fielded questions about medications for cravings as well as those for anxiety, depression and other mood disorders. The session was lively with everyone very interested. Members asked good questions and shared some of their own experiences regarding side effects as well as benefits or drawbacks.   Summary: The patient reported she is feeling very thankful and hopeful today. She is getting her 60-day chip tonight at the [place] meeting and the group applauded this news. She reported she was conscious about her self-care yesterday and is becoming more aware of being kind to herself. The patient reported "I do pray a lot" and this has changed significantly since she stopped drinking. In the psycho-ed, was attentive and agreed with another group member about the effectiveness of the sleep medication, Trazadone. The patient continues to make significant progress and displays a good  acceptance of her chronic condition. She responded well to this intervention.    UDS collected: No  AA/NA attended?: YesTuesday  Sponsor?: Yes   Rebecca Gordon, [place] 05/30/2017 9:32 AM

## 2017-06-01 NOTE — Progress Notes (Signed)
Rebecca Gordon is a 62 y.o. female patient. CD-IOP: Discharge Plan. Met with the patient as scheduled. She is on target to graduate next Monday, and I explained the need to identify her discharge plan. This is something we have discussed for the last few weeks, but today she admitted she has intentionally avoided putting pressure on herself about deciding what she will do about employment going forward.  We discussed what she will do upon graduation regarding her recovery. The patient identified the intention to attend the Aftercare weekly group on Wednesday evenings beginning on the 20th. She will remain active in the [facility], continue to meet with her sponsor and address her step work. She is pleased that he husband will be meeting with the co-therapist of the CD-IOP, WS, to discuss how he is feeling, dealing with his wife's alcoholism and the many years of frustration, hurt and disappointment. The patient is currently prescribed Trazadone and Topamax and we are suggesting that her PCP follow up with medication management. I agreed to contact this DO and request that she manage these meds. The patient was agreeable, and the discharge plan was signed accordingly. She will meet with the medical director on Monday prior to her graduation ceremony. The patient has done everything we have asked of her and she has a very clear understanding and acceptance of the chronic nature of her disease of alcoholism.  The tools are in her hands and she will remain chemical-free if she works her daily recovery plan. The patient responded well to this intervention and her sobriety date remains 03/30/17.          Brandon Melnick, LCAS

## 2017-06-03 ENCOUNTER — Encounter (HOSPITAL_COMMUNITY): Payer: Self-pay | Admitting: Psychology

## 2017-06-03 ENCOUNTER — Other Ambulatory Visit (INDEPENDENT_AMBULATORY_CARE_PROVIDER_SITE_OTHER): Payer: 59 | Admitting: Psychology

## 2017-06-03 ENCOUNTER — Encounter (HOSPITAL_COMMUNITY): Payer: Self-pay | Admitting: Medical

## 2017-06-03 DIAGNOSIS — F19980 Other psychoactive substance use, unspecified with psychoactive substance-induced anxiety disorder: Secondary | ICD-10-CM

## 2017-06-03 DIAGNOSIS — I10 Essential (primary) hypertension: Secondary | ICD-10-CM

## 2017-06-03 DIAGNOSIS — F1020 Alcohol dependence, uncomplicated: Secondary | ICD-10-CM

## 2017-06-03 DIAGNOSIS — Z8673 Personal history of transient ischemic attack (TIA), and cerebral infarction without residual deficits: Secondary | ICD-10-CM

## 2017-06-03 DIAGNOSIS — I2510 Atherosclerotic heart disease of native coronary artery without angina pectoris: Secondary | ICD-10-CM

## 2017-06-03 DIAGNOSIS — F5104 Psychophysiologic insomnia: Secondary | ICD-10-CM

## 2017-06-03 DIAGNOSIS — F10282 Alcohol dependence with alcohol-induced sleep disorder: Secondary | ICD-10-CM | POA: Diagnosis not present

## 2017-06-03 DIAGNOSIS — F10982 Alcohol use, unspecified with alcohol-induced sleep disorder: Secondary | ICD-10-CM

## 2017-06-03 DIAGNOSIS — J432 Centrilobular emphysema: Secondary | ICD-10-CM

## 2017-06-03 NOTE — Progress Notes (Signed)
    Daily Group Progress Note  Program: CD-IOP   06/03/2017 Alejandro Mulling [id number]  Diagnosis: F10.20 Alcohol Use Disorder, Severe  Sobriety Date: 03/30/17  Group Time: 1-2:30pm  Participation Level: Active  Behavioral Response: Appropriate and Sharing  Type of Therapy: Process Group  Interventions: Supportive  Topic: Patients were active and engaged in process session.  Patients shared accomplishments and challenges with relationships and recovery.  Patients shared about 12-step meetings that they have been attending, as well as support they have been building elsewhere.  Several patients shared ways that they practiced self-compassion and/or about their plans for Valentine's Day.    Group Time: 2:30-4pm  Participation Level: Active  Behavioral Response: Sharing  Type of Therapy: Psycho-education Group  Interventions: Psychosocial Skills: self-care and self-compassion  Topic: Patients were active and engaged in psychoeducation session, in which counselors facilitated discussion around self-care strategies and practices.  Counselors also led patients in guided mindfulness meditation and provided psychoeducation around the benefits of mindfulness in recovery.  Patients responded well to mindfulness and self-care psychoeducation and cited examples from their personal lives.  Summary: Patient was engaged in session.  Patient reported having a good one-on-one meeting with her sponsor.  Patient said she feels sad about her upcoming graduation from the program but was also feeling hopeful and thankful.  Patient said that she plans to attend the aftercare group on Wednesday evenings and that she will gradually look for a different job.  During mindfulness and self-care discussion, patient shared that she has been turning off the televisions in her house and going about her day in quiet. This is quite different than when she first entered the program. The patient had reported she  has every television on in the house and there is one in almost every room. She has made significant progress in early recovery and responded well to this intervention.   UDS collected: No   AA/NA attended?: No  Sponsor?: Yes   Brandon Melnick, LCAS 06/03/2017 10:07 AM

## 2017-06-03 NOTE — Progress Notes (Signed)
Patient ID: Rebecca Gordon, female   DOB: [birth year], 62 y.o.   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 She lives in [place] with her husband of 28 years. The patient's drinking has caused problems in her relationship with her husband and two adult daughters for a long time.   She had been hired for a new job in early December and traveled to [place] last week for a Architect. Ignoring her husband's suggestion that she not drink at all; the patient drank to excess at the first night's dinner. She was also observed at the bar that next evening. The management team approached her the following day, expressed concerns about her ability to represent them with clients and sent her home. "I was fired less than a week after starting the job". The patient admitted she had never been fired before. The patient agreed to appear 04/03/2017 for the orientation and will return to start the program at 1 pm.  She has remained sober since 03/30/2017 and has successfully met the requirements of her treatment program. She chosen to use Alcoholics Anonymous for her long term recovery program.   On admissionShe was initially prescribed MAT with Baclofen but was unable to tolerate GI side effects. She was switched to Topamax which she has tolerated well . Her past psychiatric history- she reported she had been taking Zoloft for  almost 20 years. It was first prescribed after her parents became ill and she began experiencing panic attacks. She requested Genesight testing as she felt the Zoloft might actually be dulling her ability to feel. The patient scored a 12 on the PHQ-9 04/11/17.She requested to taper off  Zoloft 12/27 due to c/o dysphoria and long time administration. . Repeat PHQ 9 score January 16,2019 was 3 and on January 28 , 2. Her GAD 7 scores were 5,2 and 2 on same dates with no panic and self report of "no difficulty" suggesting substance induced mood and anxiety disorders. She also c/o of sleep disturbance and was prescribed Trazodone HS 05/15/2017 and has since had no c/o sleep disturbance.She does have a history of insomnia prior to her hx of alcoholism.  Medications:  amLODipine 10 MG tablet  Commonly known as: NORVASC  Take 1 tablet (10 mg total) by mouth daily.   aspirin 325 MG tablet  Take 1 tablet (325 mg total) by mouth daily.   CALCIUM 1200 PO  Take 1 tablet by mouth daily.   cholecalciferol 1000 units tablet  Commonly known as: VITAMIN D  Take 1,000 Units by mouth 2 (two) times daily.   esomeprazole 20 MG capsule  Commonly known as: NEXIUM  Take 1 capsule (20 mg total) by mouth daily before breakfast.   gabapentin 300 MG capsule  Commonly known as: NEURONTIN  Take 1 capsule TID for 10 days   hydrochlorothiazide 12.5 MG capsule  Commonly known as: MICROZIDE  Take 1 capsule (12.5 mg total) by mouth daily.   losartan  100 MG tablet  Commonly known as: COZAAR  Take 1 tablet (100 mg total) by mouth daily.   metoprolol succinate 100 MG 24 hr tablet  Commonly known as: TOPROL-XL  TAKE 1 TABLET BY MOUTH DAILY. TAKE WITH OR IMMEDIATELY FOLLOWING A MEAL.   Omega 3 1200 MG Caps  Take 1,200 mg by mouth 2 (two) times daily.   ONE-A-DAY WOMENS 50+ ADVANTAGE Tabs  Take 1 tablet by mouth daily.   rosuvastatin 40 MG tablet  Commonly known as: CRESTOR  Take 40 mg by mouth daily.   topiramate 100 MG tablet   Commonly known as: TOPAMAX  Take 1 tablet (100 mg total) by mouth at bedtime.   traZODone 50 MG tablet  Commonly known as: DESYREL  Take 1 tablet (50 mg total) by mouth at bedtime and may repeat dose one time if needed for 30 doses.   TURMERIC      Goals and Activities to Help Maintain Sobriety: 1. Stay away from old playmates and playgounds that are triggers for drinking unless you you are in fit condition and have a legitimate reason to be there 2. Continue practicing Fair Fighting rules in interpersonal conflicts. 3. Continue alcohol and drug refusal skills and call on support systems. 4. FU with Aftercare services  Referrals: Pt will return to PCP Roma Schanz for medication management  Aftercare services: Wednesday 5:30 pm at [facility] 1. Attend AA as often as you were drinking alcohol 2. Continue with a sponsor and a home group in [place] 3. Seek to be of service  Next appointment: Aftercare  Prognosis Good IF continues treatment One Day At a Time    Client has participated in the development of this discharge plan and has received a copy of this completed plan  Marissa Calamity, PA-C

## 2017-06-04 NOTE — Progress Notes (Signed)
  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 One group member graduated successfully and met w/ Darlyne Russian, PA for her d/c session. Pts shared openly about their recovery from addiction to mind-altering chemicals. Pts were encouraged and directed to share about their specific tx goals of maintaining sobriety and establishing social support.      Group Time: 2:30-4  Participation Level: Active  Behavioral Response: Appropriate and Sharing  Type of Therapy: Psycho-education Group  Interventions: Other: Art Therapy, Face Mask exercise  Topic: Patients were active and engaged in psychoeducation session. Counselor led pts through a creative "face mask" exercise in which pts colored masks representing their "exterior and interior" selves. Pts were directed to share about their masks and the choices they made to display their external and internal parts of themselves.    Summary: Pt was active and engaged for her final group session. She reported she had an uneventful weekend, attended 3 AA meetings, and picked up her 60day sober chip. She stated her husband attended an [place] meeting w/ her and found it helpful. Pt met w/ Darlyne Russian, PA for D/C session. Pt is not currently taking any medications for her mental health and does not require referral for medications. Pt reported on her upcoming  concerns including having a "fitness plan, figuring out her future w/ her family/husband, and her relationship to God". Pt was positive and hopeful during graduation ceremony and also shared her feelings towards each group member separately.    UDS collected: No Results: negative  AA/NA attended?: YesSaturday and Sunday  Sponsor?: Yes   Youlanda Roys, LPCA LCASA 06/04/2017 3:07 PM

## 2017-06-05 ENCOUNTER — Other Ambulatory Visit (HOSPITAL_COMMUNITY): Payer: 59

## 2017-06-06 ENCOUNTER — Other Ambulatory Visit (HOSPITAL_COMMUNITY): Payer: 59 | Admitting: Psychology

## 2017-06-06 DIAGNOSIS — F17200 Nicotine dependence, unspecified, uncomplicated: Secondary | ICD-10-CM

## 2017-06-06 DIAGNOSIS — Z8673 Personal history of transient ischemic attack (TIA), and cerebral infarction without residual deficits: Secondary | ICD-10-CM

## 2017-06-06 DIAGNOSIS — F1020 Alcohol dependence, uncomplicated: Secondary | ICD-10-CM

## 2017-06-10 ENCOUNTER — Other Ambulatory Visit (HOSPITAL_COMMUNITY): Payer: 59

## 2017-06-11 ENCOUNTER — Encounter: Payer: Self-pay | Admitting: Obstetrics & Gynecology

## 2017-06-11 ENCOUNTER — Encounter (HOSPITAL_COMMUNITY): Payer: Self-pay | Admitting: Psychology

## 2017-06-11 ENCOUNTER — Ambulatory Visit (INDEPENDENT_AMBULATORY_CARE_PROVIDER_SITE_OTHER): Payer: Managed Care, Other (non HMO) | Admitting: Obstetrics & Gynecology

## 2017-06-11 VITALS — BP 122/76 | Ht 63.0 in | Wt 134.0 lb

## 2017-06-11 DIAGNOSIS — K644 Residual hemorrhoidal skin tags: Secondary | ICD-10-CM | POA: Diagnosis not present

## 2017-06-11 DIAGNOSIS — Z01419 Encounter for gynecological examination (general) (routine) without abnormal findings: Secondary | ICD-10-CM

## 2017-06-11 DIAGNOSIS — R87810 Cervical high risk human papillomavirus (HPV) DNA test positive: Secondary | ICD-10-CM

## 2017-06-11 DIAGNOSIS — Z780 Asymptomatic menopausal state: Secondary | ICD-10-CM | POA: Diagnosis not present

## 2017-06-11 DIAGNOSIS — M8589 Other specified disorders of bone density and structure, multiple sites: Secondary | ICD-10-CM | POA: Diagnosis not present

## 2017-06-11 DIAGNOSIS — Z1151 Encounter for screening for human papillomavirus (HPV): Secondary | ICD-10-CM | POA: Diagnosis not present

## 2017-06-11 DIAGNOSIS — Z13820 Encounter for screening for osteoporosis: Secondary | ICD-10-CM

## 2017-06-11 MED ORDER — HYDROCORTISONE 2.5 % RE CREA: 1.0000 "application " | TOPICAL_CREAM | Freq: Two times a day (BID) | RECTAL | 3 refills | Status: AC

## 2017-06-11 NOTE — Progress Notes (Signed)
Rebecca Gordon [birth year] [id number]   History:    62 y.o. G2P2L2  Married  RP:  Established patient presenting for annual gyn exam   HPI: Menopause, well on no HRT.  No PMB.  No pelvic pain.  Normal vaginal secretions.  Husband with erectile dysfunction, no intercourse.  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  Decreased cigarette smoking from 1-1/2-2 packs a day to a maximum of 1 pack a day currently.  Plans to increase walking with springtime coming up.  Breasts normal.  Screening mammogram normal per patient at [facility] January 2019.  Bone density May 2017 showed osteopenia.  Patient has not taken the [place] treatment prescribed by her family physician.  Urine normal.  Frequently feels bloated, but bowel movements normal.  Complains of occasionally bleeding external hemorrhoids.  Preparation H not sufficient.  Past medical history,surgical history, family history and social history were all reviewed and documented in the EPIC chart.  Gynecologic History No LMP recorded. Patient is postmenopausal. Contraception: post menopausal status Last Pap: 02/2016. Results were: Negative/HPV HR positive Last mammogram: 04/2017. Results were: Normal per patient, will obtain from [facility] Bone Density: 08/26/2015 Osteopenia T-Score -2.1 Colonoscopy: 2017  Obstetric History OB History  Gravida Para Term Preterm AB Living  2 2       2   SAB TAB Ectopic Multiple Live Births               # Outcome Date GA Lbr Len/2nd Weight Sex Delivery Anes PTL Lv  2 Para           1 Para                ROS: A ROS was performed and pertinent positives and negatives are included in the history.  GENERAL: No fevers or chills. HEENT: No change in vision, no earache, sore throat or sinus congestion. NECK: No pain or stiffness. CARDIOVASCULAR: No chest pain or pressure. No palpitations. PULMONARY: No shortness of breath, cough or wheeze. GASTROINTESTINAL: No abdominal pain, nausea,  vomiting or diarrhea, melena or bright red blood per rectum. GENITOURINARY: No urinary frequency, urgency, hesitancy or dysuria. MUSCULOSKELETAL: No joint or muscle pain, no back pain, no recent trauma. DERMATOLOGIC: No rash, no itching, no lesions. ENDOCRINE: No polyuria, polydipsia, no heat or cold intolerance. No recent change in weight. HEMATOLOGICAL: No anemia or easy bruising or bleeding. NEUROLOGIC: No headache, seizures, numbness, tingling or weakness. PSYCHIATRIC: No depression, no loss of interest in normal activity or change in sleep pattern.     Exam:   BP 122/76   Ht 5\' 3"  (1.6 m)   Wt 134 lb (60.8 kg)   BMI 23.74 kg/m   Body mass index is 23.74 kg/m.  General appearance : Well developed well nourished female. No acute distress HEENT: Eyes: no retinal hemorrhage or exudates,  Neck supple, trachea midline, no carotid bruits, no thyroidmegaly Lungs: Clear to auscultation, no rhonchi or wheezes, or rib retractions  Heart: Regular rate and rhythm, no murmurs or gallops Breast:Examined in sitting and supine position were symmetrical in appearance, no palpable masses or tenderness,  no skin retraction, no nipple inversion, no nipple discharge, no skin discoloration, no axillary or supraclavicular lymphadenopathy Abdomen: no palpable masses or tenderness, no rebound or guarding Extremities: no edema or skin discoloration or tenderness  Pelvic: Vulva: Normal             Vagina: No gross lesions or discharge  Cervix:  No gross lesions or discharge.  Pap/HR HPV done.  Uterus  AV, normal size, shape and consistency, non-tender and mobile  Adnexa  Without masses or tenderness  Anus: Normal   Assessment/Plan:  62 y.o. female for annual exam   1. Encounter for routine gynecological examination with Papanicolaou smear of cervix Normal gynecologic exam.  History of positive high risk HPV.  Pap with high risk HPV done today.  Breast exam normal.  Will obtain the screening mammogram  results from [facility] January 2019.  Colonoscopy 2017.  Health labs with family physician.  2. 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  Pap with high risk HPV done today.  3. Menopause present Well on no hormone replacement therapy.  No postmenopausal bleeding.  4. Osteopenia of multiple sites Recommend vitamin D supplements, calcium rich nutrition and regular weightbearing physical activity.  Will follow up here for bone density in May 2019. - [facility] Bone Density; Future  5. Screening for osteoporosis - [facility] Bone Density; Future  6. External hemorrhoid, bleeding External hemorrhoids nonthrombosed.  Anusol HC prescribed.  Usage reviewed.    Other orders - hydrocortisone (ANUSOL-HC) 2.5 % rectal cream; Place 1 application rectally 2 (two) times daily for 14 days.  Counseling on above issues more than 50% for 10 minutes.  Princess Bruins MD, 11:12 AM 06/11/2017

## 2017-06-11 NOTE — Patient Instructions (Signed)
1. Encounter for routine gynecological examination with Papanicolaou smear of cervix Normal gynecologic exam.  History of positive high risk HPV.  Pap with high risk HPV done today.  Breast exam normal.  Will obtain the screening mammogram results from [facility] January 2019.  Colonoscopy 2017.  Health labs with family physician.  2. 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  Pap with high risk HPV done today.  3. Menopause present Well on no hormone replacement therapy.  No postmenopausal bleeding.  4. Osteopenia of multiple sites Recommend vitamin D supplements, calcium rich nutrition and regular weightbearing physical activity.  Will follow up here for bone density in May 2019. - [facility] Bone Density; Future  5. Screening for osteoporosis - [facility] Bone Density; Future  6. External hemorrhoid, bleeding External hemorrhoids nonthrombosed.  Anusol HC prescribed.  Usage reviewed.    Other orders - hydrocortisone (ANUSOL-HC) 2.5 % rectal cream; Place 1 application rectally 2 (two) times daily for 14 days.  Rebecca Gordon, it was a pleasure seeing you today!  I will inform you of your results as soon as they are available.   Health Maintenance for Postmenopausal Women Menopause is a normal process in which your reproductive ability comes to an end. This process happens gradually over a span of months to years, usually between the ages of 47 and 37. Menopause is complete when you have missed 12 consecutive menstrual periods. It is important to talk with your health care provider about some of the most common conditions that affect postmenopausal women, such as heart disease, cancer, and bone loss (osteoporosis). Adopting a healthy lifestyle and getting preventive care can help to promote your health and wellness. Those actions can also lower your chances of developing some of these common conditions. What should I know about  menopause? During menopause, you may experience a number of symptoms, such as:  Moderate-to-severe hot flashes.  Night sweats.  Decrease in sex drive.  Mood swings.  Headaches.  Tiredness.  Irritability.  Memory problems.  Insomnia.  Choosing to treat or not to treat menopausal changes is an individual decision that you make with your health care provider. What should I know about hormone replacement therapy and supplements? Hormone therapy products are effective for treating symptoms that are associated with menopause, such as hot flashes and night sweats. Hormone replacement carries certain risks, especially as you become older. If you are thinking about using estrogen or estrogen with progestin treatments, discuss the benefits and risks with your health care provider. What should I know about heart disease and stroke? Heart disease, heart attack, and stroke become more likely as you age. This may be due, in part, to the hormonal changes that your body experiences during menopause. These can affect how your body processes dietary fats, triglycerides, and cholesterol. Heart attack and stroke are both medical emergencies. There are many things that you can do to help prevent heart disease and stroke:  Have your blood pressure checked at least every 1-2 years. High blood pressure causes heart disease and increases the risk of stroke.  If you are 54-16 years old, ask your health care provider if you should take aspirin to prevent a heart attack or a stroke.  Do not use any tobacco products, including cigarettes, chewing tobacco, or electronic cigarettes. If you need help quitting, ask your health care provider.  It is important to eat a healthy diet and maintain a healthy weight. ?  Be sure to include plenty of vegetables, fruits, low-fat dairy products, and lean protein. ? Avoid eating foods that are high in solid fats, added sugars, or salt (sodium).  Get regular exercise. This  is one of the most important things that you can do for your health. ? Try to exercise for at least 150 minutes each week. The type of exercise that you do should increase your heart rate and make you sweat. This is known as moderate-intensity exercise. ? Try to do strengthening exercises at least twice each week. Do these in addition to the moderate-intensity exercise.  Know your numbers.Ask your health care provider to check your cholesterol and your blood glucose. Continue to have your blood tested as directed by your health care provider.  What should I know about cancer screening? There are several types of cancer. Take the following steps to reduce your risk and to catch any cancer development as early as possible. Breast Cancer  Practice breast self-awareness. ? This means understanding how your breasts normally appear and feel. ? It also means doing regular breast self-exams. Let your health care provider know about any changes, no matter how small.  If you are 53 or older, have a clinician do a breast exam (clinical breast exam or CBE) every year. Depending on your age, family history, and medical history, it may be recommended that you also have a yearly breast X-ray (mammogram).  If you have a family history of breast cancer, talk with your health care provider about genetic screening.  If you are at high risk for breast cancer, talk with your health care provider about having an MRI and a mammogram every year.  Breast cancer (BRCA) gene test is recommended for women who have family members with BRCA-related cancers. Results of the assessment will determine the need for genetic counseling and BRCA1 and for BRCA2 testing. BRCA-related cancers include these types: ? Breast. This occurs in males or females. ? Ovarian. ? Tubal. This may also be called fallopian tube cancer. ? Cancer of the abdominal or pelvic lining (peritoneal cancer). ? Prostate. ? Pancreatic.  Cervical,  Uterine, and Ovarian Cancer Your health care provider may recommend that you be screened regularly for cancer of the pelvic organs. These include your ovaries, uterus, and vagina. This screening involves a pelvic exam, which includes checking for microscopic changes to the surface of your cervix (Pap test).  For women ages 21-65, health care providers may recommend a pelvic exam and a Pap test every three years. For women ages 63-65, they may recommend the Pap test and pelvic exam, combined with testing for human papilloma virus (HPV), every five years. Some types of HPV increase your risk of cervical cancer. Testing for HPV may also be done on women of any age who have unclear Pap test results.  Other health care providers may not recommend any screening for nonpregnant women who are considered low risk for pelvic cancer and have no symptoms. Ask your health care provider if a screening pelvic exam is right for you.  If you have had past treatment for cervical cancer or a condition that could lead to cancer, you need Pap tests and screening for cancer for at least 20 years after your treatment. If Pap tests have been discontinued for you, your risk factors (such as having a new sexual partner) need to be reassessed to determine if you should start having screenings again. Some women have medical problems that increase the chance of getting cervical cancer.  In these cases, your health care provider may recommend that you have screening and Pap tests more often.  If you have a family history of uterine cancer or ovarian cancer, talk with your health care provider about genetic screening.  If you have vaginal bleeding after reaching menopause, tell your health care provider.  There are currently no reliable tests available to screen for ovarian cancer.  Lung Cancer Lung cancer screening is recommended for adults 93-29 years old who are at high risk for lung cancer because of a history of smoking. A  yearly low-dose CT scan of the lungs is recommended if you:  Currently smoke.  Have a history of at least 30 pack-years of smoking and you currently smoke or have quit within the past 15 years. A pack-year is smoking an average of one pack of cigarettes per day for one year.  Yearly screening should:  Continue until it has been 15 years since you quit.  Stop if you develop a health problem that would prevent you from having lung cancer treatment.  Colorectal Cancer  This type of cancer can be detected and can often be prevented.  Routine colorectal cancer screening usually begins at age 28 and continues through age 49.  If you have risk factors for colon cancer, your health care provider may recommend that you be screened at an earlier age.  If you have a family history of colorectal cancer, talk with your health care provider about genetic screening.  Your health care provider may also recommend using home test kits to check for hidden blood in your stool.  A small camera at the end of a tube can be used to examine your colon directly (sigmoidoscopy or colonoscopy). This is done to check for the earliest forms of colorectal cancer.  Direct examination of the colon should be repeated every 5-10 years until age 48. However, if early forms of precancerous polyps or small growths are found or if you have a family history or genetic risk for colorectal cancer, you may need to be screened more often.  Skin Cancer  Check your skin from head to toe regularly.  Monitor any moles. Be sure to tell your health care provider: ? About any new moles or changes in moles, especially if there is a change in a mole's shape or color. ? If you have a mole that is larger than the size of a pencil eraser.  If any of your family members has a history of skin cancer, especially at a young age, talk with your health care provider about genetic screening.  Always use sunscreen. Apply sunscreen liberally  and repeatedly throughout the day.  Whenever you are outside, protect yourself by wearing long sleeves, pants, a wide-brimmed hat, and sunglasses.  What should I know about osteoporosis? Osteoporosis is a condition in which bone destruction happens more quickly than new bone creation. After menopause, you may be at an increased risk for osteoporosis. To help prevent osteoporosis or the bone fractures that can happen because of osteoporosis, the following is recommended:  If you are 70-62 years old, get at least 1,000 mg of calcium and at least 600 mg of vitamin D per day.  If you are older than age 76 but younger than age 13, get at least 1,200 mg of calcium and at least 600 mg of vitamin D per day.  If you are older than age 19, get at least 1,200 mg of calcium and at least 800 mg of vitamin  D per day.  Smoking and excessive alcohol intake increase the risk of osteoporosis. Eat foods that are rich in calcium and vitamin D, and do weight-bearing exercises several times each week as directed by your health care provider. What should I know about how menopause affects my mental health? Depression may occur at any age, but it is more common as you become older. Common symptoms of depression include:  Low or sad mood.  Changes in sleep patterns.  Changes in appetite or eating patterns.  Feeling an overall lack of motivation or enjoyment of activities that you previously enjoyed.  Frequent crying spells.  Talk with your health care provider if you think that you are experiencing depression. What should I know about immunizations? It is important that you get and maintain your immunizations. These include:  Tetanus, diphtheria, and pertussis (Tdap) booster vaccine.  Influenza every year before the flu season begins.  Pneumonia vaccine.  Shingles vaccine.  Your health care provider may also recommend other immunizations. This information is not intended to replace advice given to you  by your health care provider. Make sure you discuss any questions you have with your health care provider. Document Released: 05/25/2005 Document Revised: 10/21/2015 Document Reviewed: 01/04/2015 Elsevier Interactive Patient Education  2018 [employer].

## 2017-06-11 NOTE — Addendum Note (Signed)
Addended by: Thurnell Garbe A on: 06/11/2017 12:16 PM   Modules accepted: Orders

## 2017-06-11 NOTE — Progress Notes (Signed)
    Daily Group Progress Note  Program: CD-IOP   06/11/2017 Alejandro Mulling [id number]  Diagnosis: F10.20 Alcohol use Disorder, Severe  Sobriety Date: 03/30/17  Group Time: 1-2:30pm  Participation Level: Active  Behavioral Response: Sharing  Type of Therapy: Process Group  Interventions: Supportive  Topic: Patients were active and engaged in process session.  Patients shared accomplishments and challenges with relationships and recovery.  Patients shared about 12-step meetings that they have been attending, as well as support they have been building elsewhere.  The group welcomed a new member.  Counselors collected two UDS.  Group Time: 2:30-4pm  Participation Level: Active  Behavioral Response: Appropriate  Type of Therapy: Psycho-education Group  Interventions:   Topic: The second half of group was spent in a psychoeducation session. Counselors facilitated discussion around the masks that patients had made to depict how they feel and identify themselves internally vs. what they express/show to others.  Counselors provided brief psychoeducation around the importance of vulnerability in recovery.  Patients responded well to psychoeducation and engaged in discussion about identity and shame.  Summary: The patient was engaged and active in group and reported attending one [place] meeting since we last met. She was open about the discrepancies in her mask and was tearful as she recounted the contradictions from the inner to outer presentation. She shared about her childhood and shame and desire to get away from her home town and state and begin a new life. The patient made some good comments and provided supportive feedback to her fellow group members.  UDS collected: No Results: negative  AA/NA attended?: YesWednesday  Sponsor?: No   Brandon Melnick, LCAS 06/11/2017 12:34 PM

## 2017-06-12 ENCOUNTER — Other Ambulatory Visit (HOSPITAL_COMMUNITY): Payer: 59

## 2017-06-12 ENCOUNTER — Ambulatory Visit (INDEPENDENT_AMBULATORY_CARE_PROVIDER_SITE_OTHER): Payer: 59 | Admitting: Licensed Clinical Social Worker

## 2017-06-12 DIAGNOSIS — F1020 Alcohol dependence, uncomplicated: Secondary | ICD-10-CM | POA: Diagnosis not present

## 2017-06-13 ENCOUNTER — Other Ambulatory Visit (HOSPITAL_COMMUNITY): Payer: 59

## 2017-06-17 ENCOUNTER — Encounter (HOSPITAL_COMMUNITY): Payer: Self-pay | Admitting: Licensed Clinical Social Worker

## 2017-06-17 ENCOUNTER — Encounter: Payer: Self-pay | Admitting: *Deleted

## 2017-06-17 ENCOUNTER — Other Ambulatory Visit (HOSPITAL_COMMUNITY): Payer: 59

## 2017-06-17 LAB — PAP, TP IMAGING W/ HPV RNA, RFLX HPV TYPE 16,18/45: HPV DNA HIGH RISK: DETECTED — AB

## 2017-06-17 LAB — HPV TYPE 16 AND 18/45 RNA
HPV TYPE 16 RNA: NOT DETECTED
HPV TYPE 18/45 RNA: NOT DETECTED

## 2017-06-17 NOTE — Progress Notes (Signed)
  Weekly Group Progress Note  Program: OUTPATIENT SKILLS GROUP  Group Time: 5:30-6:30pm  Participation Level: Active  Behavioral Response: Appropriate  Type of Therapy:  Psycho-education Group  Skills discussed: Assertiveness, Communication    Summary of Progress: Pt listened actively during her first group. She shared briefly about recently graduating from CD-IOP. She denies any current issues.  Summary of Group: Patients were active and engaged in group skill building session. Counselor encouraged pts to share about their individual goals and needs in order to increase effectiveness. Counselor discussed assertive communication and saying "no". 2 new members were present after recently completing CD-IOP at this office.    Archie Balboa, LPCA, LCASA

## 2017-06-19 ENCOUNTER — Other Ambulatory Visit (HOSPITAL_COMMUNITY): Payer: 59

## 2017-06-19 ENCOUNTER — Ambulatory Visit (INDEPENDENT_AMBULATORY_CARE_PROVIDER_SITE_OTHER): Payer: 59 | Admitting: Licensed Clinical Social Worker

## 2017-06-19 DIAGNOSIS — F1020 Alcohol dependence, uncomplicated: Secondary | ICD-10-CM | POA: Diagnosis not present

## 2017-06-20 ENCOUNTER — Other Ambulatory Visit (HOSPITAL_COMMUNITY): Payer: 59

## 2017-06-21 ENCOUNTER — Encounter (HOSPITAL_COMMUNITY): Payer: Self-pay | Admitting: Licensed Clinical Social Worker

## 2017-06-21 NOTE — Progress Notes (Signed)
  Weekly Group Progress Note  Program: OUTPATIENT SKILLS GROUP  Group Time: 5:30-6:30pm  Participation Level: Active  Behavioral Response: Appropriate and Sharing  Type of Therapy:  Psycho-education Group  Skills discussed: Honesty in recovery, self talk   Summary of Progress: Pt was active and engaged in group. She reported she felt "mediocre" today and that she is noticing she is running out of things to do to stay busy. Pt continues to attend AA regularly though admits she wants to start sharing more in meetings. Pt states she wishes she knew "why" she became an alcoholic.  Summary of Group: Pts were active and engaged in group skillbuilding session for anxiety, depression, and Chemical Dependency. Pts were encouraged to share about their experience of recovery, skills used, and challenges faced in past week. Pts check in briefly then processed, then learned skill of "how to get honest w/ yourself by assessing your self talk".   Archie Balboa, LPCA, LCASA

## 2017-06-24 ENCOUNTER — Other Ambulatory Visit (HOSPITAL_COMMUNITY): Payer: 59

## 2017-06-26 ENCOUNTER — Ambulatory Visit (INDEPENDENT_AMBULATORY_CARE_PROVIDER_SITE_OTHER): Payer: 59 | Admitting: Licensed Clinical Social Worker

## 2017-06-26 ENCOUNTER — Other Ambulatory Visit (HOSPITAL_COMMUNITY): Payer: 59

## 2017-06-26 DIAGNOSIS — F1020 Alcohol dependence, uncomplicated: Secondary | ICD-10-CM

## 2017-06-27 ENCOUNTER — Other Ambulatory Visit (HOSPITAL_COMMUNITY): Payer: 59

## 2017-06-28 ENCOUNTER — Encounter (HOSPITAL_COMMUNITY): Payer: Self-pay | Admitting: Licensed Clinical Social Worker

## 2017-06-28 NOTE — Progress Notes (Signed)
  Weekly Group Progress Note  Program: OUTPATIENT SKILLS GROUP  Group Time: 5:30-6:30pm  Participation Level: Active  Behavioral Response: Appropriate and Sharing  Type of Therapy:  Psycho-education Group  Skills discussed: Self Care, Gratitude   Summary of Progress: Pt continues to report she is maintaining sobriety though she is struggling w/ feeling "icky and kinda down" nearly every day. She has stopped her anti-depressant around 1 mo ago. She states she feels like the metaphorical color "brown". Pt was attentive and engaged openly in session. She stated the meditation was "relaxing and nearly put her to sleep".  Summary of Group: Pts were active and engaged in group skillbuilding session for anxiety, depression, and Chemical Dependency. Pts were encouraged to share about their experience of recovery, skills used, and challenges faced in past week. Pts checked in briefly then processed, then learned skill of [place] ideas for Mind, Body, Heart, and Soul. Pts were led in brief mindfulness exercise on gratitude and breath.   Archie Balboa, LPCA, LCASA

## 2017-07-01 ENCOUNTER — Other Ambulatory Visit (HOSPITAL_COMMUNITY): Payer: 59

## 2017-07-03 ENCOUNTER — Other Ambulatory Visit (HOSPITAL_COMMUNITY): Payer: 59

## 2017-07-03 ENCOUNTER — Ambulatory Visit (INDEPENDENT_AMBULATORY_CARE_PROVIDER_SITE_OTHER): Payer: 59 | Admitting: Licensed Clinical Social Worker

## 2017-07-03 DIAGNOSIS — F1020 Alcohol dependence, uncomplicated: Secondary | ICD-10-CM | POA: Diagnosis not present

## 2017-07-10 ENCOUNTER — Encounter (HOSPITAL_COMMUNITY): Payer: Self-pay | Admitting: Licensed Clinical Social Worker

## 2017-07-10 ENCOUNTER — Ambulatory Visit (HOSPITAL_COMMUNITY): Payer: Self-pay | Admitting: Licensed Clinical Social Worker

## 2017-07-10 NOTE — Progress Notes (Signed)
  Weekly Group Progress Note  Program: OUTPATIENT SKILLS GROUP  Group Time: 5:30-6:30pm  Participation Level: Active  Behavioral Response: Appropriate and Sharing  Type of Therapy:  Psycho-education Group  Skills discussed: Open Communication   Summary of Progress: Pt was active and engaged in session, offering and accepting feedback w/ group. She states she continues to feel "blah" and somewhat down, despite having almost 90 days sobriety from ETOH. Pt states she wants to d/c this group and begin working w/ an Secretary/administrator. Pt was referred to Baxter Kail, an LMFT in [place].  Summary of Group: Pts were active and engaged in group skillbuilding session for anxiety, depression, and Chemical Dependency. Pts were encouraged to share about their experience of recovery, skills used, and challenges faced in past week. Pts checked in briefly then processed their challenges as a group, w/ each member seeking feedback form other members about their specific challenges. One new member was present and active during session.   Archie Balboa, LPCA, LCASA

## 2017-07-17 ENCOUNTER — Ambulatory Visit (HOSPITAL_COMMUNITY): Payer: Self-pay | Admitting: Licensed Clinical Social Worker

## 2017-07-18 ENCOUNTER — Telehealth: Payer: Self-pay | Admitting: Acute Care

## 2017-07-18 NOTE — Telephone Encounter (Signed)
Called and spoke with Janett Billow with [facility] regarding medical records request Provided her with Medical Records phone number [phone] She verbalized understanding and had no further questions or concerns Nothing further needed at this time

## 2017-07-19 ENCOUNTER — Other Ambulatory Visit (HOSPITAL_COMMUNITY): Payer: Self-pay | Admitting: Medical

## 2017-07-19 ENCOUNTER — Ambulatory Visit: Payer: Managed Care, Other (non HMO) | Admitting: Nurse Practitioner

## 2017-07-24 ENCOUNTER — Ambulatory Visit (HOSPITAL_COMMUNITY): Payer: Self-pay | Admitting: Licensed Clinical Social Worker

## 2017-07-31 ENCOUNTER — Other Ambulatory Visit: Payer: Self-pay | Admitting: Family Medicine

## 2017-07-31 ENCOUNTER — Ambulatory Visit (HOSPITAL_COMMUNITY): Payer: Self-pay | Admitting: Licensed Clinical Social Worker

## 2017-07-31 DIAGNOSIS — I10 Essential (primary) hypertension: Secondary | ICD-10-CM

## 2017-08-08 ENCOUNTER — Other Ambulatory Visit (HOSPITAL_COMMUNITY): Payer: Self-pay | Admitting: Medical

## 2017-08-09 ENCOUNTER — Ambulatory Visit (INDEPENDENT_AMBULATORY_CARE_PROVIDER_SITE_OTHER): Payer: Managed Care, Other (non HMO) | Admitting: Family Medicine

## 2017-08-09 ENCOUNTER — Encounter: Payer: Self-pay | Admitting: Family Medicine

## 2017-08-09 VITALS — BP 122/62 | HR 69 | Temp 98.5°F | Resp 16 | Ht 63.0 in | Wt 133.0 lb

## 2017-08-09 DIAGNOSIS — E785 Hyperlipidemia, unspecified: Secondary | ICD-10-CM

## 2017-08-09 DIAGNOSIS — F17200 Nicotine dependence, unspecified, uncomplicated: Secondary | ICD-10-CM

## 2017-08-09 DIAGNOSIS — Z0000 Encounter for general adult medical examination without abnormal findings: Secondary | ICD-10-CM

## 2017-08-09 DIAGNOSIS — F1020 Alcohol dependence, uncomplicated: Secondary | ICD-10-CM

## 2017-08-09 DIAGNOSIS — I10 Essential (primary) hypertension: Secondary | ICD-10-CM | POA: Diagnosis not present

## 2017-08-09 DIAGNOSIS — F418 Other specified anxiety disorders: Secondary | ICD-10-CM

## 2017-08-09 DIAGNOSIS — J302 Other seasonal allergic rhinitis: Secondary | ICD-10-CM

## 2017-08-09 LAB — CBC WITH DIFFERENTIAL/PLATELET
BASOS ABS: 0.2 10*3/uL — AB (ref 0.0–0.1)
Basophils Relative: 1.2 % (ref 0.0–3.0)
EOS ABS: 0.2 10*3/uL (ref 0.0–0.7)
Eosinophils Relative: 1.7 % (ref 0.0–5.0)
HCT: 42 % (ref 36.0–46.0)
Hemoglobin: 14.4 g/dL (ref 12.0–15.0)
LYMPHS ABS: 2.5 10*3/uL (ref 0.7–4.0)
Lymphocytes Relative: 18.3 % (ref 12.0–46.0)
MCHC: 34.2 g/dL (ref 30.0–36.0)
MCV: 93.5 fl (ref 78.0–100.0)
MONO ABS: 1.1 10*3/uL — AB (ref 0.1–1.0)
MONOS PCT: 7.7 % (ref 3.0–12.0)
NEUTROS ABS: 9.7 10*3/uL — AB (ref 1.4–7.7)
NEUTROS PCT: 71.1 % (ref 43.0–77.0)
Platelets: 322 10*3/uL (ref 150.0–400.0)
RBC: 4.49 Mil/uL (ref 3.87–5.11)
RDW: 13.6 % (ref 11.5–15.5)
WBC: 13.6 10*3/uL — ABNORMAL HIGH (ref 4.0–10.5)

## 2017-08-09 LAB — LIPID PANEL
CHOL/HDL RATIO: 3
Cholesterol: 113 mg/dL (ref 0–200)
HDL: 43.6 mg/dL (ref >39.00)
LDL Cholesterol: 54 mg/dL (ref 0–99)
NONHDL: 69.58
Triglycerides: 80 mg/dL (ref 0.0–149.0)
VLDL: 16 mg/dL (ref 0.0–40.0)

## 2017-08-09 LAB — COMPREHENSIVE METABOLIC PANEL
ALT: 13 U/L (ref 0–35)
AST: 10 U/L (ref 0–37)
Albumin: 4 g/dL (ref 3.5–5.2)
Alkaline Phosphatase: 61 U/L (ref 39–117)
BILIRUBIN TOTAL: 0.4 mg/dL (ref 0.2–1.2)
BUN: 19 mg/dL (ref 6–23)
CO2: 29 meq/L (ref 19–32)
Calcium: 9.3 mg/dL (ref 8.4–10.5)
Chloride: 104 mEq/L (ref 96–112)
Creatinine, Ser: 0.66 mg/dL (ref 0.40–1.20)
GFR: 96.54 mL/min (ref >60.00)
GLUCOSE: 100 mg/dL — AB (ref 70–99)
Potassium: 3.8 mEq/L (ref 3.5–5.1)
SODIUM: 141 meq/L (ref 135–145)
Total Protein: 6.4 g/dL (ref 6.0–8.3)

## 2017-08-09 LAB — TSH: TSH: 1.06 u[IU]/mL (ref 0.35–4.50)

## 2017-08-09 MED ORDER — DESVENLAFAXINE SUCCINATE ER 50 MG PO TB24
50.0000 mg | ORAL_TABLET | Freq: Every day | ORAL | 2 refills | Status: DC
Start: 2017-08-09 — End: 2017-09-06

## 2017-08-09 MED ORDER — FLUTICASONE PROPIONATE 50 MCG/ACT NA SUSP: 2.0000 | Freq: Every day | NASAL | 6 refills | Status: DC

## 2017-08-09 NOTE — Assessment & Plan Note (Signed)
Start pristiq and f/u 1 month con't counseling

## 2017-08-09 NOTE — Assessment & Plan Note (Signed)
Sober > 4 months

## 2017-08-09 NOTE — Assessment & Plan Note (Signed)
Pt will try patches to quit or at least cut down on smoking con't counseling

## 2017-08-09 NOTE — Assessment & Plan Note (Signed)
Well controlled, no changes to meds. Encouraged heart healthy diet such as the DASH diet and exercise as tolerated.  °

## 2017-08-09 NOTE — Assessment & Plan Note (Signed)
Tolerating statin, encouraged heart healthy diet, avoid trans fats, minimize simple carbs and saturated fats. Increase exercise as tolerated 

## 2017-08-09 NOTE — Progress Notes (Signed)
Subjective:  I acted as a Education administrator for [employer]. Yancey Flemings, Chauncey   Patient ID: Rebecca Gordon, female    DOB: [birth year], 62 y.o.   MRN: [id number]  Chief Complaint  Patient presents with  . Annual Exam    HPI  Patient is in today for annual exam.    She went through an [facility] week [facility]pt [facility] with [facility].  She is doing very well--- 4 months sober.   Patient Care Team: [name], Alferd Apa, DO as PCP - General Princess Bruins, MD as Consulting Physician (Obstetrics and Gynecology) Dennie Bible, NP as Nurse Practitioner (Family Medicine)   Past Medical History:  Diagnosis Date  . Anxiety   . Arthritis    "right knee"  . Depression   . Former smoker 09/23/2016  . GERD (gastroesophageal reflux disease)    Nexium  . High cholesterol   . History of bronchitis   . History of pneumonia   . Hypertension   . Panic attacks     Past Surgical History:  Procedure Laterality Date  . CLAVICLE SURGERY Right   . COLONOSCOPY    . EYE SURGERY Right    "growth on eye removed"  . ORIF CLAVICULAR FRACTURE Right 04/28/2015   Procedure: REVISION ORIF RIGHT CLAVICAL FRACTURE, ALLOGRAFT BONE GRAFTING;  Surgeon: Justice Britain, MD;  Location: [facility];  Service: Orthopedics;  Laterality: Right;  . PLACEMENT OF BREAST IMPLANTS Bilateral   . TONSILLECTOMY      Family History  Problem Relation Age of Onset  . Heart failure Mother   . Hypertension Father   . Brain cancer Father   . Prostate cancer Father   . Skin cancer Father     Social History   Socioeconomic History  . Marital status: Married    Spouse name: Not on file  . Number of children: 2  . Years of education: College  . Highest education level: Not on file  Occupational History  . Occupation: Unemployed  Social Needs  . Financial resource strain: Not on file  . Food insecurity:    Worry: Not on file    Inability: Not on file  . Transportation needs:    Medical: Not on file    Non-medical: Not on file  Tobacco  Use  . Smoking status: Current Every Day Smoker    Packs/day: 1.50    Years: 40.00    Pack years: 60.00    Last attempt to quit: 09/23/2016    Years since quitting: 0.8  . Smokeless tobacco: Never Used  . Tobacco comment: Previous 1 pack a day  Substance and Sexual Activity  . Alcohol use: No    Comment: daily - 1 bottle of wine--- 09/23/2016, 3-4 glasses--- 10/15/16  . Drug use: No  . Sexual activity: Never    Comment: 1st intercourse- 60, partners- 13, married- 9 yrs   Lifestyle  . Physical activity:    Days per week: Not on file    Minutes per session: Not on file  . Stress: Not on file  Relationships  . Social connections:    Talks on phone: Not on file    Gets together: Not on file    Attends religious service: Not on file    Active member of club or organization: Not on file    Attends meetings of clubs or organizations: Not on file    Relationship status: Not on file  . Intimate partner violence:    Fear of current or ex  partner: Not on file    Emotionally abused: Not on file    Physically abused: Not on file    Forced sexual activity: Not on file  Other Topics Concern  . Not on file  Social History Narrative   Lives at home w/ her husband   Right-handed   Caffeine: 2-3 cups per day    Outpatient Medications Prior to Visit  Medication Sig Dispense Refill  . amLODipine (NORVASC) 10 MG tablet Take 1 tablet (10 mg total) by mouth daily. 90 tablet 3  . aspirin 325 MG tablet Take 1 tablet (325 mg total) by mouth daily.    . Calcium Carbonate-Vit D-Min (CALCIUM 1200 PO) Take 1 tablet by mouth daily.    . cholecalciferol (VITAMIN D) 1000 units tablet Take 1,000 Units by mouth 2 (two) times daily.    Marland Kitchen esomeprazole (NEXIUM) 20 MG capsule Take 1 capsule (20 mg total) by mouth daily before breakfast. 90 capsule 3  . losartan (COZAAR) 100 MG tablet Take 1 tablet (100 mg total) by mouth daily. 90 tablet 3  . metoprolol succinate (TOPROL-XL) 100 MG 24 hr tablet TAKE 1 TABLET  BY MOUTH DAILY. TAKE WITH OR IMMEDIATELY FOLLOWING A MEAL. 90 tablet 0  . Multiple Vitamins-Minerals (ONE-A-DAY WOMENS 50+ ADVANTAGE) TABS Take 1 tablet by mouth daily.    . Omega 3 1200 MG CAPS Take 1,200 mg by mouth 2 (two) times daily.    . rosuvastatin (CRESTOR) 40 MG tablet Take 40 mg by mouth daily.  3  . topiramate (TOPAMAX) 100 MG tablet TAKE 1 TABLET BY MOUTH EVERYDAY AT BEDTIME 90 tablet 0  . TURMERIC PO Take 1 capsule by mouth 2 (two) times daily.    Marland Kitchen gabapentin (NEURONTIN) 300 MG capsule Take  1 capsule TID for 10 days 30 capsule 1  . hydrochlorothiazide (MICROZIDE) 12.5 MG capsule Take 1 capsule (12.5 mg total) by mouth daily. 90 capsule 3  . traZODone (DESYREL) 50 MG tablet Take 1 tablet (50 mg total) by mouth at bedtime and may repeat dose one time if needed for 30 doses. 30 tablet 2   No facility-administered medications prior to visit.     Allergies  Allergen Reactions  . Baclofen Diarrhea  . Neurontin [Gabapentin] Other (See Comments)    Somnolence    Review of Systems  Constitutional: Negative for chills, fever and malaise/fatigue.  HENT: Negative for congestion and hearing loss.   Eyes: Negative for blurred vision and discharge.  Respiratory: Negative for cough, sputum production and shortness of breath.   Cardiovascular: Negative for chest pain, palpitations and leg swelling.  Gastrointestinal: Negative for abdominal pain, blood in stool, constipation, diarrhea, heartburn, nausea and vomiting.  Genitourinary: Negative for dysuria, frequency, hematuria and urgency.  Musculoskeletal: Negative for back pain, falls and myalgias.  Skin: Negative for rash.  Neurological: Negative for dizziness, sensory change, loss of consciousness, weakness and headaches.  Endo/Heme/Allergies: Negative for environmental allergies. Does not bruise/bleed easily.  Psychiatric/Behavioral: Negative for depression and suicidal ideas. The patient is not nervous/anxious and does not have  insomnia.        Objective:    Physical Exam  Constitutional: She is oriented to person, place, and time. She appears well-developed and well-nourished. No distress.  HENT:  Right Ear: External ear normal.  Left Ear: External ear normal.  Nose: Nose normal.  Mouth/Throat: Oropharynx is clear and moist.  Eyes: Pupils are equal, round, and reactive to light. EOM are normal.  Neck: Normal range of motion.  Neck supple.  Cardiovascular: Normal rate, regular rhythm and normal heart sounds.  No murmur heard. Pulmonary/Chest: Effort normal and breath sounds normal. No respiratory distress. She has no wheezes. She has no rales. She exhibits no tenderness.  Abdominal: Soft. Bowel sounds are normal. She exhibits no distension and no mass. There is no tenderness. There is no rebound and no guarding. No hernia.  Genitourinary:  Genitourinary Comments: Per gyn  Musculoskeletal: Normal range of motion.  Neurological: She is alert and oriented to person, place, and time.  Skin: Skin is warm and dry. No rash noted. No erythema.  Psychiatric: She has a normal mood and affect. Her behavior is normal. Judgment and thought content normal.  Nursing note and vitals reviewed.   BP 122/62 (BP Location: Left Arm, Patient Position: Sitting, Cuff Size: Normal)   Pulse 69   Temp 98.5 F (36.9 C) (Oral)   Resp 16   Ht 5\' 3"  (1.6 m)   Wt 133 lb (60.3 kg)   SpO2 97%   BMI 23.56 kg/m  Wt Readings from Last 3 Encounters:  08/09/17 133 lb (60.3 kg)  06/11/17 134 lb (60.8 kg)  04/03/17 136 lb (61.7 kg)   BP Readings from Last 3 Encounters:  08/09/17 122/62  06/11/17 122/76  04/03/17 124/76     Immunization History  Administered Date(s) Administered  . Influenza-Unspecified 12/22/2016    Health Maintenance  Topic Date Due  . Hepatitis C Screening  10/19/1955  . INFLUENZA VACCINE  11/14/2017  . MAMMOGRAM  05/10/2018  . PAP SMEAR  03/07/2019  . COLONOSCOPY  09/01/2020  . TETANUS/TDAP   12/19/2020  . HIV Screening  Completed    Lab Results  Component Value Date   WBC 13.6 (H) 08/09/2017   HGB 14.4 08/09/2017   HCT 42.0 08/09/2017   PLT 322.0 08/09/2017   GLUCOSE 100 (H) 08/09/2017   CHOL 113 08/09/2017   TRIG 80.0 08/09/2017   HDL 43.60 08/09/2017   LDLCALC 54 08/09/2017   ALT 13 08/09/2017   AST 10 08/09/2017   NA 141 08/09/2017   K 3.8 08/09/2017   CL 104 08/09/2017   CREATININE 0.66 08/09/2017   BUN 19 08/09/2017   CO2 29 08/09/2017   TSH 1.06 08/09/2017   INR 0.91 09/22/2016   HGBA1C 5.5 09/23/2016    Lab Results  Component Value Date   TSH 1.06 08/09/2017   Lab Results  Component Value Date   WBC 13.6 (H) 08/09/2017   HGB 14.4 08/09/2017   HCT 42.0 08/09/2017   MCV 93.5 08/09/2017   PLT 322.0 08/09/2017   Lab Results  Component Value Date   NA 141 08/09/2017   K 3.8 08/09/2017   CO2 29 08/09/2017   GLUCOSE 100 (H) 08/09/2017   BUN 19 08/09/2017   CREATININE 0.66 08/09/2017   BILITOT 0.4 08/09/2017   ALKPHOS 61 08/09/2017   AST 10 08/09/2017   ALT 13 08/09/2017   PROT 6.4 08/09/2017   ALBUMIN 4.0 08/09/2017   CALCIUM 9.3 08/09/2017   ANIONGAP 8 09/23/2016   GFR 96.54 08/09/2017   Lab Results  Component Value Date   CHOL 113 08/09/2017   Lab Results  Component Value Date   HDL 43.60 08/09/2017   Lab Results  Component Value Date   LDLCALC 54 08/09/2017   Lab Results  Component Value Date   TRIG 80.0 08/09/2017   Lab Results  Component Value Date   CHOLHDL 3 08/09/2017   Lab Results  Component Value  Date   HGBA1C 5.5 09/23/2016         Assessment & Plan:   Problem List Items Addressed This Visit      Unprioritized   Alcohol use disorder, severe, dependence ([place])    Sober > 4 months      Current smoker    Pt will try patches to quit or at least cut down on smoking con't counseling      Depression with anxiety    Start pristiq and f/u 1 month con't counseling      Relevant Medications    desvenlafaxine (PRISTIQ) 50 MG 24 hr tablet   Essential hypertension    Well controlled, no changes to meds. Encouraged heart healthy diet such as the DASH diet and exercise as tolerated.       Hyperlipidemia LDL goal <100    Tolerating statin, encouraged heart healthy diet, avoid trans fats, minimize simple carbs and saturated fats. Increase exercise as tolerated      Preventative health care - Primary    Ghm utd Check labs See AVS      Relevant Orders   CBC with Differential/Platelet (Completed)   Comprehensive metabolic panel (Completed)   Lipid panel (Completed)   TSH (Completed)   Measles/Mumps/Rubella Immunity   Seasonal allergies   Relevant Medications   fluticasone (FLONASE) 50 MCG/ACT nasal spray      I have discontinued Dee Ann Metzger's gabapentin. I am also having her start on desvenlafaxine and fluticasone. Additionally, I am having her maintain her Calcium Carbonate-Vit D-Min (CALCIUM 1200 PO), cholecalciferol, Omega 3, ONE-A-DAY WOMENS 50+ ADVANTAGE, TURMERIC PO, aspirin, amLODipine, losartan, hydrochlorothiazide, esomeprazole, traZODone, rosuvastatin, topiramate, and metoprolol succinate.  Meds ordered this encounter  Medications  . desvenlafaxine (PRISTIQ) 50 MG 24 hr tablet    Sig: Take 1 tablet (50 mg total) by mouth daily.    Dispense:  30 tablet    Refill:  2  . fluticasone (FLONASE) 50 MCG/ACT nasal spray    Sig: Place 2 sprays into both nostrils daily.    Dispense:  16 g    Refill:  6    CMA served as scribe during this visit. History, Physical and Plan performed by medical provider. Documentation and orders reviewed and attested to.  [name], DO

## 2017-08-09 NOTE — Assessment & Plan Note (Signed)
Ghm utd Check labs  See AVS  

## 2017-08-09 NOTE — Patient Instructions (Signed)
Preventive Care 40-64 Years, Female Preventive care refers to lifestyle choices and visits with your health care provider that can promote health and wellness. What does preventive care include?  A yearly physical exam. This is also called an annual well check.  Dental exams once or twice a year.  Routine eye exams. Ask your health care provider how often you should have your eyes checked.  Personal lifestyle choices, including: ? Daily care of your teeth and gums. ? Regular physical activity. ? Eating a healthy diet. ? Avoiding tobacco and drug use. ? Limiting alcohol use. ? Practicing safe sex. ? Taking low-dose aspirin daily starting at age 58. ? Taking vitamin and mineral supplements as recommended by your health care provider. What happens during an annual well check? The services and screenings done by your health care provider during your annual well check will depend on your age, overall health, lifestyle risk factors, and family history of disease. Counseling Your health care provider may ask you questions about your:  Alcohol use.  Tobacco use.  Drug use.  Emotional well-being.  Home and relationship well-being.  Sexual activity.  Eating habits.  Work and work Statistician.  Method of birth control.  Menstrual cycle.  Pregnancy history.  Screening You may have the following tests or measurements:  Height, weight, and BMI.  Blood pressure.  Lipid and cholesterol levels. These may be checked every 5 years, or more frequently if you are over 81 years old.  Skin check.  Lung cancer screening. You may have this screening every year starting at age 78 if you have a 30-pack-year history of smoking and currently smoke or have quit within the past 15 years.  Fecal occult blood test (FOBT) of the stool. You may have this test every year starting at age 65.  Flexible sigmoidoscopy or colonoscopy. You may have a sigmoidoscopy every 5 years or a colonoscopy  every 10 years starting at age 30.  Hepatitis C blood test.  Hepatitis B blood test.  Sexually transmitted disease (STD) testing.  Diabetes screening. This is done by checking your blood sugar (glucose) after you have not eaten for a while (fasting). You may have this done every 1-3 years.  Mammogram. This may be done every 1-2 years. Talk to your health care provider about when you should start having regular mammograms. This may depend on whether you have a family history of breast cancer.  BRCA-related cancer screening. This may be done if you have a family history of breast, ovarian, tubal, or peritoneal cancers.  Pelvic exam and Pap test. This may be done every 3 years starting at age 80. Starting at age 36, this may be done every 5 years if you have a Pap test in combination with an HPV test.  Bone density scan. This is done to screen for osteoporosis. You may have this scan if you are at high risk for osteoporosis.  Discuss your test results, treatment options, and if necessary, the need for more tests with your health care provider. Vaccines Your health care provider may recommend certain vaccines, such as:  Influenza vaccine. This is recommended every year.  Tetanus, diphtheria, and acellular pertussis (Tdap, Td) vaccine. You may need a Td booster every 10 years.  Varicella vaccine. You may need this if you have not been vaccinated.  Zoster vaccine. You may need this after age 5.  Measles, mumps, and rubella (MMR) vaccine. You may need at least one dose of MMR if you were born in  1957 or later. You may also need a second dose.  Pneumococcal 13-valent conjugate (PCV13) vaccine. You may need this if you have certain conditions and were not previously vaccinated.  Pneumococcal polysaccharide (PPSV23) vaccine. You may need one or two doses if you smoke cigarettes or if you have certain conditions.  Meningococcal vaccine. You may need this if you have certain  conditions.  Hepatitis A vaccine. You may need this if you have certain conditions or if you travel or work in places where you may be exposed to hepatitis A.  Hepatitis B vaccine. You may need this if you have certain conditions or if you travel or work in places where you may be exposed to hepatitis B.  Haemophilus influenzae type b (Hib) vaccine. You may need this if you have certain conditions.  Talk to your health care provider about which screenings and vaccines you need and how often you need them. This information is not intended to replace advice given to you by your health care provider. Make sure you discuss any questions you have with your health care provider. Document Released: 04/29/2015 Document Revised: 12/21/2015 Document Reviewed: 02/01/2015 Elsevier Interactive Patient Education  2018 [employer]  

## 2017-08-12 LAB — MEASLES/MUMPS/RUBELLA IMMUNITY
MUMPS IGG: 71.9 [AU]/ml
RUBELLA: 17 {index}
Rubeola IgG: 192 AU/mL

## 2017-08-13 ENCOUNTER — Other Ambulatory Visit: Payer: Self-pay | Admitting: Gynecology

## 2017-08-13 DIAGNOSIS — M8589 Other specified disorders of bone density and structure, multiple sites: Secondary | ICD-10-CM

## 2017-08-13 DIAGNOSIS — Z13820 Encounter for screening for osteoporosis: Secondary | ICD-10-CM

## 2017-08-14 ENCOUNTER — Other Ambulatory Visit: Payer: Self-pay | Admitting: *Deleted

## 2017-08-14 DIAGNOSIS — D72829 Elevated white blood cell count, unspecified: Secondary | ICD-10-CM

## 2017-08-14 DIAGNOSIS — M810 Age-related osteoporosis without current pathological fracture: Secondary | ICD-10-CM

## 2017-08-14 HISTORY — DX: Age-related osteoporosis without current pathological fracture: M81.0

## 2017-08-14 HISTORY — PX: FOOT SURGERY: SHX648

## 2017-08-14 NOTE — Progress Notes (Signed)
R  [facility]  PATIENT: Rebecca Gordon DOB: January 18, 1956   REASON FOR VISIT: Follow-up for TIA June 2018 HISTORY FROM: Patient    HISTORY OF PRESENT ILLNESS:UPDATE 5/2/2019CM Rebecca Gordon, 62 year old female returns for follow-up with history of TIA June 2018.  She is currently on aspirin 325 daily without recurrent stroke or TIA symptoms.  She has minimal bruising and no bleeding.  She continues to have numbness and intermittent tingling sensations in the left hand.  Blood pressure in the office today 120/62 she remains on Crestor without myalgias.  She has no new neurologic complaints she does complain with fatigue and being cold.  She is currently looking for work.  She returns for reevaluation UPDATE 10/02/2018CM Rebecca Gordon, 62 year old female returns for follow-up with history of TIA in June 2018. She is currently on aspirin 325 daily for secondary stroke prevention without further TIA symptoms. She has no bruising and no signs of bleeding. She does continue to have numbness partially in the right face and  hand she still has some intermittent tingling sensations. She says she stays on the computer a lot. Blood pressure in the office today 150/90. She is compliant with her blood pressure medications. She is on Crestor 40 mg daily for hyperlipidemia without myalgias. She has had no new neurologic complaints. She returns for reevaluation  10/15/16 PS23 year old Caucasian lady seen today for first office follow-up visit following hospital admission for TIA in June 2018. She is accompanied by husband. History is obtained from the patient, review of electronic medical records and have personally reviewed imaging studies and lab results. Dee A Frawleyis a 62 y.o.femalewith a history ofhypertensionwho presents with left-sided weakness and numbnessstarted around 3:30 PM. She states that she was going into a drug store at the time, and noticed that she felt numb on her left hand.  She then got dizzyand then subsequently became unsteady.When this continued, she eventually called 911 and was brought into the emergency department shortly before the end of the 4-1/2 hour IV tPA window.Her symptoms were relatively mild and after discussing the risks and benefits of IV TPA with her, I made the recommendation to not proceed IV TPA but did discuss with her and offer it but after hearing the risks and benefits she agreed to not pursue it.LKW: 3:30 PM on 09/22/16.tpa given?: no, mild symptoms. CT scan of the head was unremarkable. 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 MRI scan showed no acute infarct. Mild nonspecific changes of small vessel disease. LDL cholesterol was 88 mg percent. Hemoglobin A1c was 5.5. Echocardiogram showed normal ejection fraction. Carotid ultrasound was unremarkable. Patient states she is doing well she is about 75% better. Numbness is still present partially in the right face as well as one fingertip. He still has some intermittent tingling. She has had no new or recurrent symptoms. The patient is currently unemployed but used to work as a Government social research officer. She is tolerating aspirin well without needing of bruising. She states her blood pressure is under good control and today it is 137/74. She is tolerating Crestor well without muscle aches and pains. She has no new complaints   REVIEW OF SYSTEMS: Full 14 system review of systems performed and notable only for those listed, all others are neg:  Constitutional: neg  Cardiovascular: neg Ear/Nose/Throat: neg  Skin: neg Eyes: neg Respiratory: neg Gastroitestinal: neg  Hematology/Lymphatic: neg  Endocrine: neg Musculoskeletal:neg Allergy/Immunology: neg Neurological: neg Psychiatric: neg Sleep :  neg   ALLERGIES: Allergies  Allergen Reactions  . Baclofen Diarrhea  . Neurontin [Gabapentin] Other (See Comments)    Somnolence    HOME  MEDICATIONS: Outpatient Medications Prior to Visit  Medication Sig Dispense Refill  . amLODipine (NORVASC) 10 MG tablet Take 1 tablet (10 mg total) by mouth daily. 90 tablet 3  . aspirin 325 MG tablet Take 1 tablet (325 mg total) by mouth daily.    Marland Kitchen b complex vitamins tablet Take 1 tablet by mouth daily.    . Calcium Carbonate-Vit D-Min (CALCIUM 1200 PO) Take 1 tablet by mouth daily.    . cholecalciferol (VITAMIN D) 1000 units tablet Take 1,000 Units by mouth 2 (two) times daily.    Marland Kitchen desvenlafaxine (PRISTIQ) 50 MG 24 hr tablet Take 1 tablet (50 mg total) by mouth daily. 30 tablet 2  . fluticasone (FLONASE) 50 MCG/ACT nasal spray Place 2 sprays into both nostrils daily. 16 g 6  . losartan (COZAAR) 100 MG tablet Take 1 tablet (100 mg total) by mouth daily. 90 tablet 3  . metoprolol succinate (TOPROL-XL) 100 MG 24 hr tablet TAKE 1 TABLET BY MOUTH DAILY. TAKE WITH OR IMMEDIATELY FOLLOWING A MEAL. 90 tablet 0  . Omega 3 1200 MG CAPS Take 1,200 mg by mouth 2 (two) times daily.    Marland Kitchen omeprazole (PRILOSEC) 20 MG capsule Take 20 mg by mouth daily.    . rosuvastatin (CRESTOR) 40 MG tablet Take 40 mg by mouth daily.  3  . topiramate (TOPAMAX) 100 MG tablet TAKE 1 TABLET BY MOUTH EVERYDAY AT BEDTIME 90 tablet 0  . TURMERIC PO Take 1 capsule by mouth 2 (two) times daily.    . Multiple Vitamins-Minerals (ONE-A-DAY WOMENS 50+ ADVANTAGE) TABS Take 1 tablet by mouth daily.    . hydrochlorothiazide (MICROZIDE) 12.5 MG capsule Take 1 capsule (12.5 mg total) by mouth daily. 90 capsule 3  . traZODone (DESYREL) 50 MG tablet Take 1 tablet (50 mg total) by mouth at bedtime and may repeat dose one time if needed for 30 doses. 30 tablet 2  . esomeprazole (NEXIUM) 20 MG capsule Take 1 capsule (20 mg total) by mouth daily before breakfast. 90 capsule 3   No facility-administered medications prior to visit.     PAST MEDICAL HISTORY: Past Medical History:  Diagnosis Date  . Anxiety   . Arthritis    "right knee"  .  Depression   . Former smoker 09/23/2016  . GERD (gastroesophageal reflux disease)    Nexium  . High cholesterol   . History of bronchitis   . History of pneumonia   . Hypertension   . Panic attacks     PAST SURGICAL HISTORY: Past Surgical History:  Procedure Laterality Date  . CLAVICLE SURGERY Right   . COLONOSCOPY    . EYE SURGERY Right    "growth on eye removed"  . ORIF CLAVICULAR FRACTURE Right 04/28/2015   Procedure: REVISION ORIF RIGHT CLAVICAL FRACTURE, ALLOGRAFT BONE GRAFTING;  Surgeon: Justice Britain, MD;  Location: [facility];  Service: Orthopedics;  Laterality: Right;  . PLACEMENT OF BREAST IMPLANTS Bilateral   . TONSILLECTOMY      FAMILY HISTORY: Family History  Problem Relation Age of Onset  . Heart failure Mother   . Hypertension Father   . Brain cancer Father   . Prostate cancer Father   . Skin cancer Father     SOCIAL HISTORY: Social History   Socioeconomic History  . Marital status: Married    Spouse  name: Not on file  . Number of children: 2  . Years of education: College  . Highest education level: Not on file  Occupational History  . Occupation: Unemployed  Social Needs  . Financial resource strain: Not on file  . Food insecurity:    Worry: Not on file    Inability: Not on file  . Transportation needs:    Medical: Not on file    Non-medical: Not on file  Tobacco Use  . Smoking status: Current Every Day Smoker    Packs/day: 1.50    Years: 40.00    Pack years: 60.00    Last attempt to quit: 09/23/2016    Years since quitting: 0.8  . Smokeless tobacco: Never Used  . Tobacco comment: Previous 1 pack a day  Substance and Sexual Activity  . Alcohol use: No    Comment: daily - 1 bottle of wine--- 09/23/2016, 3-4 glasses--- 10/15/16  . Drug use: No  . Sexual activity: Never    Comment: 1st intercourse- 17, partners- 23, married- 31 yrs   Lifestyle  . Physical activity:    Days per week: Not on file    Minutes per session: Not on file  . Stress:  Not on file  Relationships  . Social connections:    Talks on phone: Not on file    Gets together: Not on file    Attends religious service: Not on file    Active member of club or organization: Not on file    Attends meetings of clubs or organizations: Not on file    Relationship status: Not on file  . Intimate partner violence:    Fear of current or ex partner: Not on file    Emotionally abused: Not on file    Physically abused: Not on file    Forced sexual activity: Not on file  Other Topics Concern  . Not on file  Social History Narrative   Lives at home w/ her husband   Right-handed   Caffeine: 2-3 cups per day     PHYSICAL EXAM  Vitals:     BP: (!) 120/60    Pulse: 87  Weight: 134 lb 12.8 oz (61.1 kg)  Height: 5\' 4"  (1.626 m)   Body mass index is 23.52 kg/m.  Generalized: Well developed, in no acute distress  Head: normocephalic and atraumatic,. Oropharynx benign  Neck: Supple, no carotid bruits thyroid nodule left neck Cardiac: Regular rate rhythm, no murmur  Musculoskeletal: No deformity   Neurological examination   Mentation: Alert oriented to time, place, history taking. Attention span and concentration appropriate. Recent and remote memory intact.  Follows all commands speech and language fluent.   Cranial nerve II-XII: Pupils were equal round reactive to light extraocular movements were full, visual field were full on confrontational test. Facial sensation and strength were normal. hearing was intact to finger rubbing bilaterally. Uvula tongue midline. head turning and shoulder shrug were normal and symmetric.Tongue protrusion into cheek strength was normal. Motor: normal bulk and tone, full strength in the BUE, BLE, fine finger movements normal, no pronator drift.  Sensory: normal and symmetric to light touch,  in the upper and lower extremities  Coordination: finger-nose-finger,  no dysmetria, no tremor Reflexes: 1+ upper lower and symmetric plantar  responses were flexor bilaterally. Gait and Station: Rising up from seated position without assistance, normal stance,  moderate stride, good arm swing, smooth turning, able to perform tiptoe, and heel walking without difficulty. Tandem gait is steady. No  assistive device  DIAGNOSTIC DATA (LABS, IMAGING, TESTING) - I reviewed patient records, labs, notes, testing and imaging myself where available.  Lab Results  Component Value Date   WBC 13.6 (H) 08/09/2017   HGB 14.4 08/09/2017   HCT 42.0 08/09/2017   MCV 93.5 08/09/2017   PLT 322.0 08/09/2017      Component Value Date/Time   NA 141 08/09/2017 0938   K 3.8 08/09/2017 0938   CL 104 08/09/2017 0938   CO2 29 08/09/2017 0938   GLUCOSE 100 (H) 08/09/2017 0938   BUN 19 08/09/2017 0938   CREATININE 0.66 08/09/2017 0938   CALCIUM 9.3 08/09/2017 0938   PROT 6.4 08/09/2017 0938   ALBUMIN 4.0 08/09/2017 0938   AST 10 08/09/2017 0938   ALT 13 08/09/2017 0938   ALKPHOS 61 08/09/2017 0938   BILITOT 0.4 08/09/2017 0938   GFRNONAA >60 09/23/2016 0312   GFRAA >60 09/23/2016 0312   Lab Results  Component Value Date   CHOL 113 08/09/2017   HDL 43.60 08/09/2017   LDLCALC 54 08/09/2017   TRIG 80.0 08/09/2017   CHOLHDL 3 08/09/2017   Lab Results  Component Value Date   HGBA1C 5.5 09/23/2016     ASSESSMENT AND PLAN 62 year old patient with episode of transient left  arm  paresthesias of unclear etiology possible TIA . Patient also has history of hypertension and panic attacks  . The patient is a current patient of Dr. Leonie Man  who is out of the office today . This note is sent to the work in doctor. Noted left thyroid nodule   on exam.  PLAN: Stressed the importance of management of risk factors to prevent further stroke/TIA Continue Aspirin for secondary stroke prevention Maintain strict control of hypertension with blood pressure goal below 130/90, today's reading120/62, continue antihypertensive medications Cholesterol with LDL  cholesterol less than 70, followed by primary care,   continue Crestor No more than 1 alcoholic drink daily Thyroid nodule left neck follow up with PCP Exercise by walking, at least 30 minutes daily eat healthy diet with whole grains,  fresh fruits and vegetables, lean meats and fish Try to stop smoking Discharge from stroke clinic I spent 25 minutes in total face to face time with the patient more than 50% of which was spent counseling and coordination of care, reviewing test results reviewing medications and discussing and reviewing the diagnosis of stroke/TIA and management of risk factors.  Also discussed follow-up for thyroid nodule with primary care Rebecca Gordon, [facility], [facility], [facility] [address], [place], [place] [address] [phone]

## 2017-08-15 ENCOUNTER — Encounter

## 2017-08-15 ENCOUNTER — Telehealth: Payer: Self-pay | Admitting: Family Medicine

## 2017-08-15 ENCOUNTER — Ambulatory Visit: Payer: Managed Care, Other (non HMO) | Admitting: Nurse Practitioner

## 2017-08-15 ENCOUNTER — Encounter: Payer: Self-pay | Admitting: Nurse Practitioner

## 2017-08-15 VITALS — BP 120/62 | HR 73 | Ht 63.0 in | Wt 132.8 lb

## 2017-08-15 DIAGNOSIS — F17200 Nicotine dependence, unspecified, uncomplicated: Secondary | ICD-10-CM | POA: Diagnosis not present

## 2017-08-15 DIAGNOSIS — E785 Hyperlipidemia, unspecified: Secondary | ICD-10-CM

## 2017-08-15 DIAGNOSIS — I10 Essential (primary) hypertension: Secondary | ICD-10-CM | POA: Diagnosis not present

## 2017-08-15 DIAGNOSIS — G459 Transient cerebral ischemic attack, unspecified: Secondary | ICD-10-CM

## 2017-08-15 NOTE — Telephone Encounter (Signed)
Copied from [place] [id number]. Topic: Quick Communication - See Telephone Encounter >> Aug 15, 2017  2:15 PM Synthia Innocent wrote: CRM for notification. See Telephone encounter for: 08/15/17.Requesting to speak to Dr Nonda Lou CMA, patient has just seen her neurologist, Cecille Rubin, NP. Would like to talk regarding the visit.

## 2017-08-15 NOTE — Patient Instructions (Addendum)
Stressed the importance of management of risk factors to prevent further stroke/TIA Continue Aspirin for secondary stroke prevention Maintain strict control of hypertension with blood pressure goal below 130/90, today's reading120/62, continue antihypertensive medications Cholesterol with LDL cholesterol less than 70, followed by primary care,   continue Crestor No more than 1 alcoholic drink daily Thyroid nodule left neck follow up with PCP Exercise by walking, at least 30 minutes daily eat healthy diet with whole grains,  fresh fruits and vegetables, lean meats and fish Discharge from stroke clinic  Stroke Prevention Some health problems and behaviors may make it more likely for you to have a stroke. Below are ways to lessen your risk of having a stroke.  Be active for at least 30 minutes on most or all days.  Do not smoke. Try not to be around others who smoke.  Do not drink too much alcohol. ? Do not have more than 2 drinks a day if you are a man. ? Do not have more than 1 drink a day if you are a woman and are not pregnant.  Eat healthy foods, such as fruits and vegetables. If you were put on a specific diet, follow the diet as told.  Keep your cholesterol levels under control through diet and medicines. Look for foods that are low in saturated fat, trans fat, cholesterol, and are high in fiber.  If you have diabetes, follow all diet plans and take your medicine as told.  Ask your doctor if you need treatment to lower your blood pressure. If you have high blood pressure (hypertension), follow all diet plans and take your medicine as told by your doctor.  If you are 71-57 years old, have your blood pressure checked every 3-5 years. If you are age 26 or older, have your blood pressure checked every year.  Keep a healthy weight. Eat foods that are low in calories, salt, saturated fat, trans fat, and cholesterol.  Do not take drugs.  Avoid birth control pills, if this applies.  Talk to your doctor about the risks of taking birth control pills.  Talk to your doctor if you have sleep problems (sleep apnea).  Take all medicine as told by your doctor. ? You may be told to take aspirin or blood thinner medicine. Take this medicine as told by your doctor. ? Understand your medicine instructions.  Make sure any other conditions you have are being taken care of.  Get help right away if:  You suddenly lose feeling (you feel numb) or have weakness in your face, arm, or leg.  Your face or eyelid hangs down to one side.  You suddenly feel confused.  You have trouble talking (aphasia) or understanding what people are saying.  You suddenly have trouble seeing in one or both eyes.  You suddenly have trouble walking.  You are dizzy.  You lose your balance or your movements are clumsy (uncoordinated).  You suddenly have a very bad headache and you do not know the cause.  You have new chest pain.  Your heart feels like it is fluttering or skipping a beat (irregular heartbeat). Do not wait to see if the symptoms above go away. Get help right away. Call your local emergency services (911 in [place] Do not drive yourself to the hospital. This information is not intended to replace advice given to you by your health care provider. Make sure you discuss any questions you have with your health care provider. Document Released: 10/02/2011 Document Revised: 09/08/2015  Document Reviewed: 10/03/2012 Elsevier Interactive Patient Education  [employer].

## 2017-08-16 NOTE — Telephone Encounter (Signed)
Patient stated that she saw her neurologist yesterday and was wondering if you got an email from them.  They felt around on her neck and they told her that they felt a nodule.  Advised that you will be back in on Monday and that we will call you after you review the note and email.

## 2017-08-18 NOTE — Progress Notes (Signed)
I agree with the assessment and plan as directed by NP .The patient is known to me .   Gwendolin Briel, MD  

## 2017-08-19 ENCOUNTER — Other Ambulatory Visit: Payer: Self-pay | Admitting: Family Medicine

## 2017-08-19 ENCOUNTER — Telehealth: Payer: Self-pay | Admitting: Family Medicine

## 2017-08-19 DIAGNOSIS — E041 Nontoxic single thyroid nodule: Secondary | ICD-10-CM

## 2017-08-19 NOTE — Telephone Encounter (Signed)
I can see their note--- I ordered a thyroid US

## 2017-08-19 NOTE — Telephone Encounter (Signed)
Patient notified

## 2017-08-19 NOTE — Telephone Encounter (Signed)
Copied from [place]. Topic: Quick Communication - Rx Refill/Question >> Aug 19, 2017  2:05 PM Scherrie Gerlach wrote: Medication: traZODone (DESYREL) 50 MG tablet Has the patient contacted their pharmacy? Yes, but Dr Etter Sjogren has never filled this Pt states this was refilled when she was in rehab.  Wants to know if Dr Etter Sjogren will start doing this med for her.  [id number] [address], [place] [phone] (Phone) [phone] (Fax)

## 2017-08-20 ENCOUNTER — Other Ambulatory Visit: Payer: Self-pay | Admitting: Family Medicine

## 2017-08-20 MED ORDER — TRAZODONE HCL 50 MG PO TABS: 50.0000 mg | ORAL_TABLET | Freq: Every evening | ORAL | 2 refills | Status: DC | PRN

## 2017-08-20 NOTE — Telephone Encounter (Signed)
done

## 2017-08-21 ENCOUNTER — Ambulatory Visit (HOSPITAL_BASED_OUTPATIENT_CLINIC_OR_DEPARTMENT_OTHER)
Admission: RE | Admit: 2017-08-21 | Discharge: 2017-08-21 | Disposition: A | Discharge status: Home / Self Care | Payer: Managed Care, Other (non HMO) | Source: Ambulatory Visit | Attending: Family Medicine | Admitting: Family Medicine

## 2017-08-21 DIAGNOSIS — E041 Nontoxic single thyroid nodule: Secondary | ICD-10-CM | POA: Insufficient documentation

## 2017-08-22 ENCOUNTER — Other Ambulatory Visit: Payer: Self-pay | Admitting: Gynecology

## 2017-08-22 ENCOUNTER — Encounter: Payer: Self-pay | Admitting: Gynecology

## 2017-08-22 ENCOUNTER — Telehealth: Payer: Self-pay | Admitting: Gynecology

## 2017-08-22 ENCOUNTER — Ambulatory Visit (INDEPENDENT_AMBULATORY_CARE_PROVIDER_SITE_OTHER): Payer: Managed Care, Other (non HMO)

## 2017-08-22 ENCOUNTER — Encounter: Payer: Self-pay | Admitting: *Deleted

## 2017-08-22 DIAGNOSIS — Z13820 Encounter for screening for osteoporosis: Secondary | ICD-10-CM

## 2017-08-22 DIAGNOSIS — M8589 Other specified disorders of bone density and structure, multiple sites: Secondary | ICD-10-CM

## 2017-08-22 DIAGNOSIS — M810 Age-related osteoporosis without current pathological fracture: Secondary | ICD-10-CM

## 2017-08-22 NOTE — Telephone Encounter (Signed)
Sent mychart mesage

## 2017-08-22 NOTE — Telephone Encounter (Signed)
Tell patient her bone density is consistent with osteoporosis.  Recommend office visit with Dr Dellis Filbert to discuss treatment options.

## 2017-08-22 NOTE — Telephone Encounter (Signed)
Sent mychart message

## 2017-09-02 NOTE — Telephone Encounter (Signed)
Pt informed, will call back to schedule visit with ML

## 2017-09-06 ENCOUNTER — Ambulatory Visit: Payer: Managed Care, Other (non HMO) | Admitting: Family Medicine

## 2017-09-06 ENCOUNTER — Other Ambulatory Visit: Payer: Managed Care, Other (non HMO)

## 2017-09-06 ENCOUNTER — Encounter: Payer: Self-pay | Admitting: Family Medicine

## 2017-09-06 DIAGNOSIS — D72829 Elevated white blood cell count, unspecified: Secondary | ICD-10-CM

## 2017-09-06 DIAGNOSIS — F418 Other specified anxiety disorders: Secondary | ICD-10-CM | POA: Diagnosis not present

## 2017-09-06 LAB — CBC WITH DIFFERENTIAL/PLATELET
Basophils Absolute: 0.1 10*3/uL (ref 0.0–0.1)
Basophils Relative: 1.5 % (ref 0.0–3.0)
Eosinophils Absolute: 0.3 10*3/uL (ref 0.0–0.7)
Eosinophils Relative: 3.3 % (ref 0.0–5.0)
HCT: 41.9 % (ref 36.0–46.0)
Hemoglobin: 14.2 g/dL (ref 12.0–15.0)
Lymphocytes Relative: 23.4 % (ref 12.0–46.0)
Lymphs Abs: 2.1 10*3/uL (ref 0.7–4.0)
MCHC: 33.8 g/dL (ref 30.0–36.0)
MCV: 93 fl (ref 78.0–100.0)
Monocytes Absolute: 0.8 10*3/uL (ref 0.1–1.0)
Monocytes Relative: 9 % (ref 3.0–12.0)
Neutro Abs: 5.7 10*3/uL (ref 1.4–7.7)
Neutrophils Relative %: 62.8 % (ref 43.0–77.0)
Platelets: 250 10*3/uL (ref 150.0–400.0)
RBC: 4.5 Mil/uL (ref 3.87–5.11)
RDW: 13.7 % (ref 11.5–15.5)
WBC: 9.1 10*3/uL (ref 4.0–10.5)

## 2017-09-06 MED ORDER — DESVENLAFAXINE SUCCINATE ER 50 MG PO TB24: 50.0000 mg | ORAL_TABLET | Freq: Every day | ORAL | 3 refills | Status: DC

## 2017-09-06 MED ORDER — TRAZODONE HCL 50 MG PO TABS: 50.0000 mg | ORAL_TABLET | Freq: Every evening | ORAL | 1 refills | Status: DC | PRN

## 2017-09-06 NOTE — Patient Instructions (Signed)

## 2017-09-06 NOTE — Progress Notes (Signed)
Patient ID: Rebecca Gordon, female    DOB: 1955-06-12  Age: 62 y.o. MRN: [id number]    Subjective:  Subjective  HPI Rebecca Gordon presents for f/u anxiety/ depression. She is doing well.  She is in [place] and getting ready to get her 6 month chip.     Review of Systems  Constitutional: Negative for chills and fever.  HENT: Negative for congestion and hearing loss.   Eyes: Negative for discharge.  Respiratory: Negative for cough and shortness of breath.   Cardiovascular: Negative for chest pain, palpitations and leg swelling.  Gastrointestinal: Negative for abdominal pain, blood in stool, constipation, diarrhea, nausea and vomiting.  Genitourinary: Negative for dysuria, frequency, hematuria and urgency.  Musculoskeletal: Negative for back pain and myalgias.  Skin: Negative for rash.  Allergic/Immunologic: Negative for environmental allergies.  Neurological: Negative for dizziness, weakness and headaches.  Hematological: Does not bruise/bleed easily.  Psychiatric/Behavioral: Negative for suicidal ideas. The patient is not nervous/anxious.     History Past Medical History:  Diagnosis Date  . Anxiety   . Arthritis    "right knee"  . Depression   . Former smoker 09/23/2016  . GERD (gastroesophageal reflux disease)    Nexium  . High cholesterol   . History of bronchitis   . History of pneumonia   . Hypertension   . Osteoporosis 08/2017   T score -2.7  . Panic attacks     She has a past surgical history that includes Tonsillectomy; Clavicle surgery (Right); Placement of breast implants (Bilateral); Colonoscopy; Eye surgery (Right); and ORIF clavicular fracture (Right, 04/28/2015).   Her family history includes Brain cancer in her father; Heart failure in her mother; Hypertension in her father; Prostate cancer in her father; Skin cancer in her father.She reports that she has been smoking.  She has a 60.00 pack-year smoking history. She has never used smokeless tobacco. She reports  that she does not drink alcohol or use drugs.  Current Outpatient Medications on File Prior to Visit  Medication Sig Dispense Refill  . amLODipine (NORVASC) 10 MG tablet Take 1 tablet (10 mg total) by mouth daily. 90 tablet 3  . aspirin 325 MG tablet Take 1 tablet (325 mg total) by mouth daily.    Marland Kitchen b complex vitamins tablet Take 1 tablet by mouth daily.    . Calcium Carbonate-Vit D-Min (CALCIUM 1200 PO) Take 1 tablet by mouth daily.    . cholecalciferol (VITAMIN D) 1000 units tablet Take 1,000 Units by mouth 2 (two) times daily.    . fluticasone (FLONASE) 50 MCG/ACT nasal spray Place 2 sprays into both nostrils daily. 16 g 6  . hydrochlorothiazide (MICROZIDE) 12.5 MG capsule Take 1 capsule (12.5 mg total) by mouth daily. 90 capsule 3  . losartan (COZAAR) 100 MG tablet Take 1 tablet (100 mg total) by mouth daily. 90 tablet 3  . metoprolol succinate (TOPROL-XL) 100 MG 24 hr tablet TAKE 1 TABLET BY MOUTH DAILY. TAKE WITH OR IMMEDIATELY FOLLOWING A MEAL. 90 tablet 0  . Omega 3 1200 MG CAPS Take 1,200 mg by mouth 2 (two) times daily.    Marland Kitchen omeprazole (PRILOSEC) 20 MG capsule Take 20 mg by mouth daily.    . rosuvastatin (CRESTOR) 40 MG tablet Take 40 mg by mouth daily.  3  . topiramate (TOPAMAX) 100 MG tablet TAKE 1 TABLET BY MOUTH EVERYDAY AT BEDTIME 90 tablet 0  . TURMERIC PO Take 1 capsule by mouth 2 (two) times daily.     No  current facility-administered medications on file prior to visit.      Objective:  Objective  Physical Exam  Constitutional: She is oriented to person, place, and time. She appears well-developed and well-nourished.  HENT:  Head: Normocephalic and atraumatic.  Eyes: Conjunctivae and EOM are normal.  Neck: Normal range of motion. Neck supple. No JVD present. Carotid bruit is not present. No thyromegaly present.  Cardiovascular: Normal rate, regular rhythm and normal heart sounds.  No murmur heard. Pulmonary/Chest: Effort normal and breath sounds normal. No  respiratory distress. She has no wheezes. She has no rales. She exhibits no tenderness.  Musculoskeletal: She exhibits no edema.  Neurological: She is alert and oriented to person, place, and time.  Psychiatric: She has a normal mood and affect. Her behavior is normal. Judgment and thought content normal.  Nursing note and vitals reviewed.  BP 126/60 (BP Location: Left Arm, Cuff Size: Normal)   Pulse 70   Temp 98.6 F (37 C) (Oral)   Resp 16   Ht 5\' 3"  (1.6 m)   Wt 132 lb 12.8 oz (60.2 kg)   SpO2 98%   BMI 23.52 kg/m  Wt Readings from Last 3 Encounters:  09/06/17 132 lb 12.8 oz (60.2 kg)  08/15/17 132 lb 12.8 oz (60.2 kg)  08/09/17 133 lb (60.3 kg)     Lab Results  Component Value Date   WBC 9.1 09/06/2017   HGB 14.2 09/06/2017   HCT 41.9 09/06/2017   PLT 250.0 09/06/2017   GLUCOSE 100 (H) 08/09/2017   CHOL 113 08/09/2017   TRIG 80.0 08/09/2017   HDL 43.60 08/09/2017   LDLCALC 54 08/09/2017   ALT 13 08/09/2017   AST 10 08/09/2017   NA 141 08/09/2017   K 3.8 08/09/2017   CL 104 08/09/2017   CREATININE 0.66 08/09/2017   BUN 19 08/09/2017   CO2 29 08/09/2017   TSH 1.06 08/09/2017   INR 0.91 09/22/2016   HGBA1C 5.5 09/23/2016    US Thyroid  Result Date: 08/21/2017 CLINICAL DATA:  Palpable abnormality.  Possible left thyroid nodule. EXAM: THYROID ULTRASOUND TECHNIQUE: Ultrasound examination of the thyroid gland and adjacent soft tissues was performed. COMPARISON:  None. FINDINGS: Parenchymal Echotexture: Moderately heterogenous Isthmus: 0.1 cm Right lobe: 4.9 x 1.8 x 1.9 cm Left lobe: 4.5 x 1.9 x 1.8 cm _________________________________________________________ Estimated total number of nodules >/= 1 cm: 0 Number of spongiform nodules >/=  2 cm not described below (TR1): 0 Number of mixed cystic and solid nodules >/= 1.5 cm not described below (TR2): 0 _________________________________________________________ Several nodules bilaterally measure 0.9 cm or less and do not meet  criteria for biopsy nor follow-up. IMPRESSION: Multiple bilateral nodules measure 0.9 cm or less and do not meet criteria for biopsy nor follow-up. The above is in keeping with the ACR TI-RADS recommendations - J Am Coll Radiol 2017;14:587-595. Electronically Signed   By: Marybelle Killings M.D.   On: 08/21/2017 11:51     Assessment & Plan:  Plan  I am having Rebecca Gordon maintain her Calcium Carbonate-Vit D-Min (CALCIUM 1200 PO), cholecalciferol, Omega 3, TURMERIC PO, aspirin, amLODipine, losartan, hydrochlorothiazide, rosuvastatin, topiramate, metoprolol succinate, fluticasone, omeprazole, b complex vitamins, desvenlafaxine, and traZODone.  Meds ordered this encounter  Medications  . desvenlafaxine (PRISTIQ) 50 MG 24 hr tablet    Sig: Take 1 tablet (50 mg total) by mouth daily.    Dispense:  90 tablet    Refill:  3  . traZODone (DESYREL) 50 MG tablet  Sig: Take 1 tablet (50 mg total) by mouth at bedtime and may repeat dose one time if needed for 30 doses.    Dispense:  90 tablet    Refill:  1    Problem List Items Addressed This Visit      Unprioritized   Depression with anxiety    Stable con't meds rto cpe       Relevant Medications   desvenlafaxine (PRISTIQ) 50 MG 24 hr tablet   traZODone (DESYREL) 50 MG tablet   Leukocytosis    Recheck labs today         Follow-up: Return if symptoms worsen or fail to improve, for annual exam, fasting.  [name], DO

## 2017-09-06 NOTE — Assessment & Plan Note (Signed)
Stable con't meds  rto cpe 

## 2017-09-06 NOTE — Assessment & Plan Note (Signed)
Recheck labs today. 

## 2017-09-06 NOTE — Progress Notes (Deleted)
Patient ID: Rebecca Gordon, female   DOB: Jun 15, 1955, 62 y.o.   MRN: [id number]     Subjective:  I acted as a Education administrator for Dr. [name].  Guerry Bruin, [place]   Patient ID: Rebecca Gordon, female    DOB: 05/04/1955, 62 y.o.   MRN: [id number]  Chief Complaint  Patient presents with  . Anxiety  . Depression    HPI  Patient is in today for follow up anxiety and depression.    Depression screen PHQ 2/9 09/06/2017  Decreased Interest 1  Down, Depressed, Hopeless 1  PHQ - 2 Score 2  Altered sleeping 0  Tired, decreased energy 0  Change in appetite 0  Feeling bad or failure about yourself  0  Trouble concentrating 0  Moving slowly or fidgety/restless 0  Suicidal thoughts 0  PHQ-9 Score 2  Some encounter information is confidential and restricted. Go to Review Flowsheets activity to see all data.     Patient Care Team: [name], Alferd Apa, DO as PCP - General Princess Bruins, MD as Consulting Physician (Obstetrics and Gynecology) Dennie Bible, NP as Nurse Practitioner (Family Medicine)   Past Medical History:  Diagnosis Date  . Anxiety   . Arthritis    "right knee"  . Depression   . Former smoker 09/23/2016  . GERD (gastroesophageal reflux disease)    Nexium  . High cholesterol   . History of bronchitis   . History of pneumonia   . Hypertension   . Osteoporosis 08/2017   T score -2.7  . Panic attacks     Past Surgical History:  Procedure Laterality Date  . CLAVICLE SURGERY Right   . COLONOSCOPY    . EYE SURGERY Right    "growth on eye removed"  . ORIF CLAVICULAR FRACTURE Right 04/28/2015   Procedure: REVISION ORIF RIGHT CLAVICAL FRACTURE, ALLOGRAFT BONE GRAFTING;  Surgeon: Justice Britain, MD;  Location: [facility];  Service: Orthopedics;  Laterality: Right;  . PLACEMENT OF BREAST IMPLANTS Bilateral   . TONSILLECTOMY      Family History  Problem Relation Age of Onset  . Heart failure Mother   . Hypertension Father   . Brain cancer Father   . Prostate cancer  Father   . Skin cancer Father     Social History   Socioeconomic History  . Marital status: Married    Spouse name: Not on file  . Number of children: 2  . Years of education: College  . Highest education level: Not on file  Occupational History  . Occupation: Unemployed  Social Needs  . Financial resource strain: Not on file  . Food insecurity:    Worry: Not on file    Inability: Not on file  . Transportation needs:    Medical: Not on file    Non-medical: Not on file  Tobacco Use  . Smoking status: Current Every Day Smoker    Packs/day: 1.50    Years: 40.00    Pack years: 60.00    Last attempt to quit: 09/23/2016    Years since quitting: 0.9  . Smokeless tobacco: Never Used  . Tobacco comment: Previous 1 pack a day  Substance and Sexual Activity  . Alcohol use: No    Comment: daily - 1 bottle of wine--- 09/23/2016, 3-4 glasses--- 10/15/16  . Drug use: No  . Sexual activity: Never    Comment: 1st intercourse- 48, partners- 58, married- 20 yrs   Lifestyle  . Physical activity:    Days per  week: Not on file    Minutes per session: Not on file  . Stress: Not on file  Relationships  . Social connections:    Talks on phone: Not on file    Gets together: Not on file    Attends religious service: Not on file    Active member of club or organization: Not on file    Attends meetings of clubs or organizations: Not on file    Relationship status: Not on file  . Intimate partner violence:    Fear of current or ex partner: Not on file    Emotionally abused: Not on file    Physically abused: Not on file    Forced sexual activity: Not on file  Other Topics Concern  . Not on file  Social History Narrative   Lives at home w/ her husband   Right-handed   Caffeine: 2-3 cups per day    Outpatient Medications Prior to Visit  Medication Sig Dispense Refill  . amLODipine (NORVASC) 10 MG tablet Take 1 tablet (10 mg total) by mouth daily. 90 tablet 3  . aspirin 325 MG tablet Take  1 tablet (325 mg total) by mouth daily.    Marland Kitchen b complex vitamins tablet Take 1 tablet by mouth daily.    . Calcium Carbonate-Vit D-Min (CALCIUM 1200 PO) Take 1 tablet by mouth daily.    . cholecalciferol (VITAMIN D) 1000 units tablet Take 1,000 Units by mouth 2 (two) times daily.    Marland Kitchen desvenlafaxine (PRISTIQ) 50 MG 24 hr tablet Take 1 tablet (50 mg total) by mouth daily. 30 tablet 2  . fluticasone (FLONASE) 50 MCG/ACT nasal spray Place 2 sprays into both nostrils daily. 16 g 6  . hydrochlorothiazide (MICROZIDE) 12.5 MG capsule Take 1 capsule (12.5 mg total) by mouth daily. 90 capsule 3  . losartan (COZAAR) 100 MG tablet Take 1 tablet (100 mg total) by mouth daily. 90 tablet 3  . metoprolol succinate (TOPROL-XL) 100 MG 24 hr tablet TAKE 1 TABLET BY MOUTH DAILY. TAKE WITH OR IMMEDIATELY FOLLOWING A MEAL. 90 tablet 0  . Omega 3 1200 MG CAPS Take 1,200 mg by mouth 2 (two) times daily.    Marland Kitchen omeprazole (PRILOSEC) 20 MG capsule Take 20 mg by mouth daily.    . rosuvastatin (CRESTOR) 40 MG tablet Take 40 mg by mouth daily.  3  . topiramate (TOPAMAX) 100 MG tablet TAKE 1 TABLET BY MOUTH EVERYDAY AT BEDTIME 90 tablet 0  . TURMERIC PO Take 1 capsule by mouth 2 (two) times daily.    . traZODone (DESYREL) 50 MG tablet Take 1 tablet (50 mg total) by mouth at bedtime and may repeat dose one time if needed for 30 doses. 30 tablet 2   No facility-administered medications prior to visit.     Allergies  Allergen Reactions  . Baclofen Diarrhea  . Neurontin [Gabapentin] Other (See Comments)    Somnolence    Review of Systems  Constitutional: Negative for fever and malaise/fatigue.  HENT: Negative for congestion.   Eyes: Negative for blurred vision.  Respiratory: Negative for cough and shortness of breath.   Cardiovascular: Negative for chest pain, palpitations and leg swelling.  Gastrointestinal: Negative for vomiting.  Musculoskeletal: Negative for back pain.  Skin: Negative for rash.  Neurological:  Negative for loss of consciousness and headaches.       Objective:    Physical Exam  BP 126/60 (BP Location: Left Arm, Cuff Size: Normal)   Pulse 70  Temp 98.6 F (37 C) (Oral)   Resp 16   Ht 5\' 3"  (1.6 m)   Wt 132 lb 12.8 oz (60.2 kg)   SpO2 98%   BMI 23.52 kg/m  Wt Readings from Last 3 Encounters:  09/06/17 132 lb 12.8 oz (60.2 kg)  08/15/17 132 lb 12.8 oz (60.2 kg)  08/09/17 133 lb (60.3 kg)   BP Readings from Last 3 Encounters:  09/06/17 126/60  08/15/17 120/62  08/09/17 122/62     Immunization History  Administered Date(s) Administered  . Influenza-Unspecified 12/22/2016    Health Maintenance  Topic Date Due  . Hepatitis C Screening  Nov 17, 1955  . INFLUENZA VACCINE  11/14/2017  . MAMMOGRAM  05/10/2018  . PAP SMEAR  03/07/2019  . COLONOSCOPY  09/01/2020  . TETANUS/TDAP  12/19/2020  . HIV Screening  Completed    Lab Results  Component Value Date   WBC 13.6 (H) 08/09/2017   HGB 14.4 08/09/2017   HCT 42.0 08/09/2017   PLT 322.0 08/09/2017   GLUCOSE 100 (H) 08/09/2017   CHOL 113 08/09/2017   TRIG 80.0 08/09/2017   HDL 43.60 08/09/2017   LDLCALC 54 08/09/2017   ALT 13 08/09/2017   AST 10 08/09/2017   NA 141 08/09/2017   K 3.8 08/09/2017   CL 104 08/09/2017   CREATININE 0.66 08/09/2017   BUN 19 08/09/2017   CO2 29 08/09/2017   TSH 1.06 08/09/2017   INR 0.91 09/22/2016   HGBA1C 5.5 09/23/2016    Lab Results  Component Value Date   TSH 1.06 08/09/2017   Lab Results  Component Value Date   WBC 13.6 (H) 08/09/2017   HGB 14.4 08/09/2017   HCT 42.0 08/09/2017   MCV 93.5 08/09/2017   PLT 322.0 08/09/2017   Lab Results  Component Value Date   NA 141 08/09/2017   K 3.8 08/09/2017   CO2 29 08/09/2017   GLUCOSE 100 (H) 08/09/2017   BUN 19 08/09/2017   CREATININE 0.66 08/09/2017   BILITOT 0.4 08/09/2017   ALKPHOS 61 08/09/2017   AST 10 08/09/2017   ALT 13 08/09/2017   PROT 6.4 08/09/2017   ALBUMIN 4.0 08/09/2017   CALCIUM 9.3 08/09/2017    ANIONGAP 8 09/23/2016   GFR 96.54 08/09/2017   Lab Results  Component Value Date   CHOL 113 08/09/2017   Lab Results  Component Value Date   HDL 43.60 08/09/2017   Lab Results  Component Value Date   LDLCALC 54 08/09/2017   Lab Results  Component Value Date   TRIG 80.0 08/09/2017   Lab Results  Component Value Date   CHOLHDL 3 08/09/2017   Lab Results  Component Value Date   HGBA1C 5.5 09/23/2016         Assessment & Plan:   Problem List Items Addressed This Visit    Leukocytosis   Depression with anxiety      I am having Alejandro Mulling maintain her Calcium Carbonate-Vit D-Min (CALCIUM 1200 PO), cholecalciferol, Omega 3, TURMERIC PO, aspirin, amLODipine, losartan, hydrochlorothiazide, rosuvastatin, topiramate, metoprolol succinate, desvenlafaxine, fluticasone, omeprazole, b complex vitamins, and traZODone.  No orders of the defined types were placed in this encounter.   {PROVIDER TO DELETE} Jerene Dilling, CMA

## 2017-09-26 ENCOUNTER — Encounter: Payer: Self-pay | Admitting: Obstetrics & Gynecology

## 2017-09-26 ENCOUNTER — Ambulatory Visit: Payer: Managed Care, Other (non HMO) | Admitting: Obstetrics & Gynecology

## 2017-09-26 VITALS — BP 132/82

## 2017-09-26 DIAGNOSIS — M810 Age-related osteoporosis without current pathological fracture: Secondary | ICD-10-CM | POA: Diagnosis not present

## 2017-09-26 DIAGNOSIS — K219 Gastro-esophageal reflux disease without esophagitis: Secondary | ICD-10-CM

## 2017-09-26 NOTE — Progress Notes (Signed)
    Rebecca Gordon [place] [birth year] [id number]        62 y.o.  G2P2L2 Married  RP: Bone Density results counseling/Management of new diagnosis of Osteoporosis  HPI: Patient seen for Annual Gyn visit 06/11/2017, we noted:  Menopause, well on no HRT.  No PMB.  No pelvic pain. Normal vaginal secretions.  Husband with erectile dysfunction, no intercourse.  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  Decreased cigarette smoking from 1-1/2-2 packs a day to a maximum of 1 pack a day currently.  Plans to increase walking with springtime coming up.  Bone density May 2017 showed osteopenia.  Patient has not taken the [place] treatment prescribed by her family physician.   Continues to abstain from alcohol.  Smoking about 1 pack of cigarettes/day.  Walking, but not regularly.  BMI 23.74 in 05/2017.  On Vit D supplements.  Ca++ 9.3 normal 08/09/2017.   OB History  Gravida Para Term Preterm AB Living  2 2       2   SAB TAB Ectopic Multiple Live Births               # Outcome Date GA Lbr Len/2nd Weight Sex Delivery Anes PTL Lv  2 Para           1 Para             Past medical history,surgical history, problem list, medications, allergies, family history and social history were all reviewed and documented in the EPIC chart.   Directed ROS with pertinent positives and negatives documented in the history of present illness/assessment and plan.  Exam:  Vitals:   09/26/17 1614  BP: 132/82   General appearance:  Normal  Bone density 08/22/2017:  Osteoporosis with T-Score -2.7 at Left Femoral Neck, -2.5 at Right Femoral Neck.  Many other sites with Osteopenia.   Assessment/Plan:  62 y.o. G2P2   1. Age-related osteoporosis without current pathological fracture Given osteoporosis with significant risk of fall in a patient with GERD, decision to start on Prolia therapy.  Usage, risks and benefit of Prolia thoroughly reviewed.  Low risk of jaw necrosis discussed.  Patient  recommended to avoid major dental work while on [employer].  Information and pamphlet on Prolia given to patient.  Will verify vitamin D level today.  Calcium was normal at 9.3 on August 09, 2017.  Importance of vitamin D supplementation, calcium rich nutrition and regular weightbearing physical activity reviewed with patient.  - VITAMIN D 25 Hydroxy (Vit-D Deficiency, Fractures); Future  2. Gastroesophageal reflux disease without esophagitis Contraindication to biphosphonate therapy.  Counseling on above issues and coordination of care more than 50% for 25 minutes.  Princess Bruins MD, 4:48 PM 09/26/2017

## 2017-09-27 ENCOUNTER — Other Ambulatory Visit: Payer: Managed Care, Other (non HMO)

## 2017-09-27 DIAGNOSIS — M810 Age-related osteoporosis without current pathological fracture: Secondary | ICD-10-CM

## 2017-09-28 LAB — VITAMIN D 25 HYDROXY (VIT D DEFICIENCY, FRACTURES): VIT D 25 HYDROXY: 64 ng/mL (ref 30–100)

## 2017-09-29 ENCOUNTER — Encounter: Payer: Self-pay | Admitting: Obstetrics & Gynecology

## 2017-09-29 NOTE — Patient Instructions (Signed)
1. Age-related osteoporosis without current pathological fracture Given osteoporosis with significant risk of fall in a patient with GERD, decision to start on Prolia therapy.  Usage, risks and benefit of Prolia thoroughly reviewed.  Low risk of jaw necrosis discussed.  Patient recommended to avoid major dental work while on [employer].  Information and pamphlet on Prolia given to patient.  Will verify vitamin D level today.  Calcium was normal at 9.3 on August 09, 2017.  Importance of vitamin D supplementation, calcium rich nutrition and regular weightbearing physical activity reviewed with patient.  - VITAMIN D 25 Hydroxy (Vit-D Deficiency, Fractures); Future  2. Gastroesophageal reflux disease without esophagitis Contraindication to biphosphonate therapy.  Scharlene Corn, it was a pleasure seeing you today!

## 2017-09-30 ENCOUNTER — Telehealth: Payer: Self-pay | Admitting: *Deleted

## 2017-09-30 NOTE — Telephone Encounter (Signed)
Prolia insurance verification has been sent awaiting Summary of benefits  

## 2017-09-30 NOTE — Telephone Encounter (Signed)
-----   Message from Catha Brow sent at 09/30/2017  8:50 AM EDT ----- Regarding: FW: Prolia New start for Prolia no history oral meds has GERD ?? documentation ----- Message ----- From: Princess Bruins, MD Sent: 09/26/2017  10:05 PM To: [facility] [place] Subject: Prolia                                         New diagnosis of Osteoporosis.  Bone Density 08/22/2017 T-Score -2.7.  Ca++ normal at 9.3 on 08/09/2017.  Has GERD, contraindication to Bi-phosphanates.  Start on Prolia.

## 2017-10-02 ENCOUNTER — Other Ambulatory Visit: Payer: Self-pay | Admitting: Family Medicine

## 2017-10-02 DIAGNOSIS — I10 Essential (primary) hypertension: Secondary | ICD-10-CM

## 2017-10-11 ENCOUNTER — Ambulatory Visit: Payer: Managed Care, Other (non HMO) | Admitting: *Deleted

## 2017-10-11 ENCOUNTER — Encounter: Payer: Self-pay | Admitting: Obstetrics & Gynecology

## 2017-10-11 DIAGNOSIS — M810 Age-related osteoporosis without current pathological fracture: Secondary | ICD-10-CM

## 2017-10-11 MED ORDER — DENOSUMAB 60 MG/ML ~~LOC~~ SOSY
60.0000 mg | PREFILLED_SYRINGE | Freq: Once | SUBCUTANEOUS | Status: AC
  Administered 2017-10-11: 60 mg via SUBCUTANEOUS

## 2017-10-11 NOTE — Progress Notes (Signed)
prolia 

## 2017-10-11 NOTE — Telephone Encounter (Signed)
Prolia Given 10/11/17 Next injection 04/13/18

## 2017-10-11 NOTE — Telephone Encounter (Signed)
Deductible Amount met  OOP MAX $6850 ($2514 amount met)  Annual exam 06/11/17 ML  Calcium 9.3   Date 08/09/17  Upcoming dental procedures NO  Prior Authorization needed YES (approved 09/21/17-10/12/2018 reference [id number]    [phone] option 4  Pt estimated Cost $50  APPT 10/11/17 @ 1130     Coverage Details: The provider is in network for this patient's plan. Whether an office visit is billed or not, the patient will be responsible for a $50 co-pay which will cover Prolia, administration, and the office visit. Co-pays contribute to a $6850 out of pocket max ($2513.88 met). Once OOP max is met, copays will be waived. No referral required.  

## 2017-10-17 ENCOUNTER — Other Ambulatory Visit: Payer: Self-pay | Admitting: Family Medicine

## 2017-10-17 DIAGNOSIS — I10 Essential (primary) hypertension: Secondary | ICD-10-CM

## 2017-10-18 ENCOUNTER — Other Ambulatory Visit (HOSPITAL_COMMUNITY): Payer: Self-pay | Admitting: Medical

## 2017-10-18 ENCOUNTER — Other Ambulatory Visit: Payer: Self-pay | Admitting: Family Medicine

## 2017-10-18 DIAGNOSIS — I10 Essential (primary) hypertension: Secondary | ICD-10-CM

## 2017-10-30 ENCOUNTER — Other Ambulatory Visit: Payer: Self-pay | Admitting: Family Medicine

## 2017-10-30 DIAGNOSIS — I10 Essential (primary) hypertension: Secondary | ICD-10-CM

## 2017-10-31 ENCOUNTER — Other Ambulatory Visit: Payer: Self-pay

## 2017-10-31 DIAGNOSIS — I10 Essential (primary) hypertension: Secondary | ICD-10-CM

## 2017-10-31 NOTE — Telephone Encounter (Signed)
Medication refill request. Last OV: 09/06/17  Last refill: 07/31/17  Refill sent to pt's pharmacy.

## 2017-11-05 ENCOUNTER — Telehealth: Payer: Self-pay

## 2017-11-05 MED ORDER — TOPIRAMATE 100 MG PO TABS: ORAL_TABLET | ORAL | 0 refills | Status: DC

## 2017-11-05 NOTE — Telephone Encounter (Signed)
Topamax refill request received via fax and filled per protocol.

## 2017-11-27 ENCOUNTER — Telehealth: Payer: Self-pay | Admitting: *Deleted

## 2017-11-27 NOTE — Telephone Encounter (Signed)
-----   Message from Alen Blew, [place] sent at 11/26/2017  5:12 PM EDT ----- Regarding: Prolia copay card Can you call this patient? Her benefit quoted by [employer] was incorrect.  It was a $50 copay plus 20%.  Therefore she has a balance of $220.38.  She does qualify for the Copay card. She can sign up for retropayment for injection date 10/11/17.  Once she signs up she can call us and I can send all information to them for payment so the patient doesn't have to pay.   Thanks Sharrie Rothman

## 2017-11-27 NOTE — Telephone Encounter (Signed)
Spoke with pt she states she will call me back. She out walking the dogs

## 2017-11-27 NOTE — Telephone Encounter (Signed)
Spoke with patient she is aware of the bill of 220.38 and aware of the Prolia card she will call the number I provided to get it set up and will call us back with the card number.  Will route to Glass blower/designer as a FYI and close encounter  Awaiting card number.

## 2017-12-17 ENCOUNTER — Encounter: Payer: Self-pay | Admitting: *Deleted

## 2017-12-19 ENCOUNTER — Other Ambulatory Visit: Payer: Self-pay | Admitting: Podiatry

## 2017-12-19 ENCOUNTER — Ambulatory Visit: Payer: Managed Care, Other (non HMO) | Admitting: Family Medicine

## 2017-12-19 ENCOUNTER — Encounter: Payer: Self-pay | Admitting: Anesthesiology

## 2017-12-19 ENCOUNTER — Encounter: Payer: Self-pay | Admitting: Family Medicine

## 2017-12-19 ENCOUNTER — Ambulatory Visit (INDEPENDENT_AMBULATORY_CARE_PROVIDER_SITE_OTHER): Payer: Managed Care, Other (non HMO)

## 2017-12-19 ENCOUNTER — Ambulatory Visit: Payer: Managed Care, Other (non HMO) | Admitting: Podiatry

## 2017-12-19 ENCOUNTER — Encounter: Payer: Self-pay | Admitting: Podiatry

## 2017-12-19 VITALS — BP 112/45 | HR 72 | Temp 98.4°F | Resp 16 | Ht 63.0 in | Wt 128.4 lb

## 2017-12-19 DIAGNOSIS — Z23 Encounter for immunization: Secondary | ICD-10-CM

## 2017-12-19 DIAGNOSIS — F17210 Nicotine dependence, cigarettes, uncomplicated: Secondary | ICD-10-CM

## 2017-12-19 DIAGNOSIS — M6740 Ganglion, unspecified site: Secondary | ICD-10-CM

## 2017-12-19 DIAGNOSIS — M79671 Pain in right foot: Secondary | ICD-10-CM

## 2017-12-19 DIAGNOSIS — D169 Benign neoplasm of bone and articular cartilage, unspecified: Secondary | ICD-10-CM

## 2017-12-19 DIAGNOSIS — K648 Other hemorrhoids: Secondary | ICD-10-CM | POA: Diagnosis not present

## 2017-12-19 MED ORDER — HYDROCORTISONE ACETATE 25 MG RE SUPP: 25.0000 mg | Freq: Two times a day (BID) | RECTAL | 0 refills | Status: DC

## 2017-12-19 MED ORDER — VARENICLINE TARTRATE 0.5 MG X 11 & 1 MG X 42 PO MISC: ORAL | 0 refills | Status: DC

## 2017-12-19 NOTE — Patient Instructions (Signed)
Health Risks of Smoking  Smoking cigarettes is very bad for your health. Tobacco smoke has over 200 known poisons in it. It contains the poisonous gases nitrogen oxide and carbon monoxide. There are over 60 chemicals in tobacco smoke that cause cancer.  Smoking is difficult to quit because a chemical in tobacco, called nicotine, causes addiction or dependence. When you smoke and inhale, nicotine is absorbed rapidly into the bloodstream through your lungs. Both inhaled and non-inhaled nicotine may be addictive.  What are the risks of cigarette smoke?  Cigarette smokers have an increased risk of many serious medical problems, including:  · Lung cancer.  · Lung disease, such as pneumonia, bronchitis, and emphysema.  · Chest pain (angina) and heart attack because the heart is not getting enough oxygen.  · Heart disease and peripheral blood vessel disease.  · High blood pressure (hypertension).  · Stroke.  · Oral cancer, including cancer of the lip, mouth, or voice box.  · Bladder cancer.  · Pancreatic cancer.  · Cervical cancer.  · Pregnancy complications, including premature birth.  · Stillbirths and smaller newborn babies, birth defects, and genetic damage to sperm.  · Early menopause.  · Lower estrogen level for women.  · Infertility.  · Facial wrinkles.  · Blindness.  · Increased risk of broken bones (fractures).  · Senile dementia.  · Stomach ulcers and internal bleeding.  · Delayed wound healing and increased risk of complications during surgery.  · Even smoking lightly shortens your life expectancy by several years.    Because of secondhand smoke exposure, children of smokers have an increased risk of the following:  · Sudden infant death syndrome (SIDS).  · Respiratory infections.  · Lung cancer.  · Heart disease.  · Ear infections.    What are the benefits of quitting?  There are many health benefits of quitting smoking. Here are some of them:   · Within days of quitting smoking, your risk of having a heart attack decreases, your blood flow improves, and your lung capacity improves. Blood pressure, pulse rate, and breathing patterns start returning to normal soon after quitting.  · Within months, your lungs may clear up completely.  · Quitting for 10 years reduces your risk of developing lung cancer and heart disease to almost that of a nonsmoker.  · People who quit may see an improvement in their overall quality of life.    How do I quit smoking?  Smoking is an addiction with both physical and psychological effects, and longtime habits can be hard to change. Your health care provider can recommend:  · Programs and community resources, which may include group support, education, or talk therapy.  · Prescription medicines to help reduce cravings.  · Nicotine replacement products, such as patches, gum, and nasal sprays. Use these products only as directed. Do not replace cigarette smoking with electronic cigarettes, which are commonly called e-cigarettes. The safety of e-cigarettes is not known, and some may contain harmful chemicals.  · A combination of two or more of these methods.    Where to find more information:  · American Lung Association: www.lung.org  · American Cancer Society: www.cancer.org  Summary  · Smoking cigarettes is very bad for your health. Cigarette smokers have an increased risk of many serious medical problems, including several cancers, heart disease, and stroke.  · Smoking is an addiction with both physical and psychological effects, and longtime habits can be hard to change.  · By stopping right away, you   can greatly reduce the risk of medical problems for you and your family.  · To help you quit smoking, your health care provider can recommend programs, community resources, prescription medicines, and nicotine replacement products such as patches, gum, and nasal sprays.   This information is not intended to replace advice given to you by your health care provider. Make sure you discuss any questions you have with your health care provider.  Document Released: 05/10/2004 Document Revised: 04/06/2016 Document Reviewed: 04/06/2016  Elsevier Interactive Patient Education © 2017 Elsevier Inc.

## 2017-12-19 NOTE — Progress Notes (Signed)
   Subjective:    Patient ID: Rebecca Gordon, female    DOB: 17-Dec-1955, 61 y.o.   MRN: [id number]  HPI    Review of Systems  All other systems reviewed and are negative.      Objective:   Physical Exam        Assessment & Plan:

## 2017-12-19 NOTE — Progress Notes (Signed)
Patient ID: Rebecca Gordon, female    DOB: August 07, 1955  Age: 62 y.o. MRN: [id number]    Subjective:  Subjective  HPI Rebecca Gordon presents for smoking cessation.  She has tried all otc methods and failed.  She has tried acupuncture and hypnosis with no success.  She would like to try the chantix again.   She is also requesting a suppository for int hemorrhoids and is requesting a shingrix shot  Review of Systems  Constitutional: Negative for activity change, appetite change, fatigue and unexpected weight change.  Respiratory: Negative for cough and shortness of breath.   Cardiovascular: Negative for chest pain and palpitations.  Psychiatric/Behavioral: Negative for behavioral problems and dysphoric mood. The patient is not nervous/anxious.     History Past Medical History:  Diagnosis Date  . Anxiety   . Arthritis    "right knee"  . Depression   . Former smoker 09/23/2016  . GERD (gastroesophageal reflux disease)    Nexium  . High cholesterol   . History of bronchitis   . History of pneumonia   . Hypertension   . Osteoporosis 08/2017   T score -2.7  . Panic attacks     She has a past surgical history that includes Tonsillectomy; Clavicle surgery (Right); Placement of breast implants (Bilateral); Colonoscopy; Eye surgery (Right); and ORIF clavicular fracture (Right, 04/28/2015).   Her family history includes Brain cancer in her father; Heart failure in her mother; Hypertension in her father; Prostate cancer in her father; Skin cancer in her father.She reports that she has been smoking. She has a 60.00 pack-year smoking history. She has never used smokeless tobacco. She reports that she does not drink alcohol or use drugs.  Current Outpatient Medications on File Prior to Visit  Medication Sig Dispense Refill  . amLODipine (NORVASC) 10 MG tablet TAKE 1 TABLET BY MOUTH EVERY DAY 90 tablet 3  . aspirin 325 MG tablet Take 1 tablet (325 mg total) by mouth daily.    Marland Kitchen b complex  vitamins tablet Take 1 tablet by mouth daily.    . Calcium Carbonate-Vit D-Min (CALCIUM 1200 PO) Take 1 tablet by mouth daily.    . cholecalciferol (VITAMIN D) 1000 units tablet Take 1,000 Units by mouth 2 (two) times daily.    Marland Kitchen desvenlafaxine (PRISTIQ) 50 MG 24 hr tablet Take 1 tablet (50 mg total) by mouth daily. 90 tablet 3  . fluticasone (FLONASE) 50 MCG/ACT nasal spray Place 2 sprays into both nostrils daily. 16 g 6  . hydrochlorothiazide (MICROZIDE) 12.5 MG capsule TAKE 1 CAPSULE BY MOUTH EVERY DAY 90 capsule 3  . losartan (COZAAR) 100 MG tablet TAKE 1 TABLET BY MOUTH EVERY DAY 90 tablet 3  . metoprolol succinate (TOPROL-XL) 100 MG 24 hr tablet TAKE 1 TABLET BY MOUTH DAILY. TAKE WITH OR IMMEDIATELY FOLLOWING A MEAL. 90 tablet 1  . Omega 3 1200 MG CAPS Take 1,200 mg by mouth 2 (two) times daily.    Marland Kitchen omeprazole (PRILOSEC) 20 MG capsule Take 20 mg by mouth daily.    . rosuvastatin (CRESTOR) 40 MG tablet Take 40 mg by mouth daily.  3  . topiramate (TOPAMAX) 100 MG tablet TAKE 1 TABLET BY MOUTH EVERYDAY AT BEDTIME 90 tablet 0  . traZODone (DESYREL) 50 MG tablet TAKE 1 TABLET (50 MG TOTAL) BY MOUTH AT BEDTIME AND MAY REPEAT DOSE ONE TIME IF NEEDED FOR 30 DOSES.  1  . TURMERIC PO Take 1 capsule by mouth 2 (two) times daily.  No current facility-administered medications on file prior to visit.      Objective:  Objective  Physical Exam  Constitutional: She is oriented to person, place, and time. She appears well-developed and well-nourished.  HENT:  Head: Normocephalic and atraumatic.  Eyes: Conjunctivae and EOM are normal.  Neck: Normal range of motion. Neck supple. No JVD present. Carotid bruit is not present. No thyromegaly present.  Cardiovascular: Normal rate, regular rhythm and normal heart sounds.  No murmur heard. Pulmonary/Chest: Effort normal and breath sounds normal. No respiratory distress. She has no wheezes. She has no rales. She exhibits no tenderness.  Musculoskeletal:  She exhibits no edema.  Neurological: She is alert and oriented to person, place, and time.  Psychiatric: She has a normal mood and affect.  Nursing note and vitals reviewed.  BP (!) 112/45 (BP Location: Left Arm, Cuff Size: Normal)   Pulse 72   Temp 98.4 F (36.9 C) (Oral)   Resp 16   Ht 5\' 3"  (1.6 m)   Wt 128 lb 6.4 oz (58.2 kg)   SpO2 98%   BMI 22.75 kg/m  Wt Readings from Last 3 Encounters:  12/19/17 128 lb 6.4 oz (58.2 kg)  09/06/17 132 lb 12.8 oz (60.2 kg)  08/15/17 132 lb 12.8 oz (60.2 kg)     Lab Results  Component Value Date   WBC 9.1 09/06/2017   HGB 14.2 09/06/2017   HCT 41.9 09/06/2017   PLT 250.0 09/06/2017   GLUCOSE 100 (H) 08/09/2017   CHOL 113 08/09/2017   TRIG 80.0 08/09/2017   HDL 43.60 08/09/2017   LDLCALC 54 08/09/2017   ALT 13 08/09/2017   AST 10 08/09/2017   NA 141 08/09/2017   K 3.8 08/09/2017   CL 104 08/09/2017   CREATININE 0.66 08/09/2017   BUN 19 08/09/2017   CO2 29 08/09/2017   TSH 1.06 08/09/2017   INR 0.91 09/22/2016   HGBA1C 5.5 09/23/2016    US Thyroid  Result Date: 08/21/2017 CLINICAL DATA:  Palpable abnormality.  Possible left thyroid nodule. EXAM: THYROID ULTRASOUND TECHNIQUE: Ultrasound examination of the thyroid gland and adjacent soft tissues was performed. COMPARISON:  None. FINDINGS: Parenchymal Echotexture: Moderately heterogenous Isthmus: 0.1 cm Right lobe: 4.9 x 1.8 x 1.9 cm Left lobe: 4.5 x 1.9 x 1.8 cm _________________________________________________________ Estimated total number of nodules >/= 1 cm: 0 Number of spongiform nodules >/=  2 cm not described below (TR1): 0 Number of mixed cystic and solid nodules >/= 1.5 cm not described below (TR2): 0 _________________________________________________________ Several nodules bilaterally measure 0.9 cm or less and do not meet criteria for biopsy nor follow-up. IMPRESSION: Multiple bilateral nodules measure 0.9 cm or less and do not meet criteria for biopsy nor follow-up. The  above is in keeping with the ACR TI-RADS recommendations - J Am Coll Radiol 2017;14:587-595. Electronically Signed   By: Marybelle Killings M.D.   On: 08/21/2017 11:51     Assessment & Plan:  Plan  I am having Rebecca Gordon start on hydrocortisone and varenicline. I am also having her maintain her Calcium Carbonate-Vit D-Min (CALCIUM 1200 PO), cholecalciferol, Omega 3, TURMERIC PO, aspirin, rosuvastatin, fluticasone, omeprazole, b complex vitamins, desvenlafaxine, losartan, hydrochlorothiazide, amLODipine, traZODone, metoprolol succinate, and topiramate.  Meds ordered this encounter  Medications  . hydrocortisone (ANUSOL-HC) 25 MG suppository    Sig: Place 1 suppository (25 mg total) rectally 2 (two) times daily.    Dispense:  24 suppository    Refill:  0  . varenicline (CHANTIX  STARTING MONTH PAK) 0.5 MG X 11 & 1 MG X 42 tablet    Sig: Take one 0.5 mg tablet by mouth once daily for 3 days, then increase to one 0.5 mg tablet twice daily for 4 days, then increase to one 1 mg tablet twice daily.    Dispense:  53 tablet    Refill:  0    Problem List Items Addressed This Visit    None    Visit Diagnoses    Internal hemorrhoids    -  Primary   Relevant Medications   hydrocortisone (ANUSOL-HC) 25 MG suppository   Smoking greater than 10 pack years       Relevant Medications   varenicline (CHANTIX STARTING MONTH PAK) 0.5 MG X 11 & 1 MG X 42 tablet   Need for shingles vaccine       Relevant Orders   Varicella-zoster vaccine IM (Shingrix) (Completed)      Follow-up: Return in about 3 months (around 03/20/2018), or if symptoms worsen or fail to improve.  [name], DO

## 2017-12-22 NOTE — Progress Notes (Signed)
Subjective:   Patient ID: Rebecca Gordon, female   DOB: 62 y.o.   MRN: [id number]   HPI Patient presents stating she is had a knot on top of her right foot for about 6 months and it is hard to wear shoe gear with.  Patient states that it came up all of a sudden and has been bothersome since that point and she does not remember injury and she has quit smoking and likes to be active   Review of Systems  All other systems reviewed and are negative.       Objective:  Physical Exam  Constitutional: She appears well-developed and well-nourished.  Cardiovascular: Intact distal pulses.  Pulmonary/Chest: Effort normal.  Musculoskeletal: Normal range of motion.  Neurological: She is alert.  Skin: Skin is warm.  Nursing note and vitals reviewed.   Neurovascular status intact muscle strength is adequate range of motion within normal limits with patient found to have a freely movable nodule around the first metatarsocuneiform right with pain upon palpation to this area.  There is no proximal erythema or other pathology and it measures approximately 7 x 8 mm.  Patient has good digital perfusion and is well oriented x3     Assessment:  Probability that this is a soft tissue ganglionic versus a bone spur with soft tissue reactive tissue formation     Plan:  H&P x-ray reviewed and today I did a proximal nerve block sterile prep of the area aspirated the area and was unable to get any form of gelatinous material out.  I did carefully inject underneath it with 1.5 mg of dexamethasone Kenalog to try to shrink the area and advised on hot compresses and that there is a good chance we will have to excise this mass with possible smoothing of bone.  Patient will be reevaluated again in the next month  X-ray indicates no overt bone spur but it is difficult to make complete determination due to the 2-dimensional view

## 2018-01-08 ENCOUNTER — Other Ambulatory Visit: Payer: Self-pay | Admitting: Family Medicine

## 2018-01-08 DIAGNOSIS — F17210 Nicotine dependence, cigarettes, uncomplicated: Secondary | ICD-10-CM

## 2018-01-09 ENCOUNTER — Encounter: Payer: Self-pay | Admitting: Podiatry

## 2018-01-09 ENCOUNTER — Ambulatory Visit (INDEPENDENT_AMBULATORY_CARE_PROVIDER_SITE_OTHER): Payer: Managed Care, Other (non HMO) | Admitting: Podiatry

## 2018-01-09 DIAGNOSIS — D492 Neoplasm of unspecified behavior of bone, soft tissue, and skin: Secondary | ICD-10-CM | POA: Diagnosis not present

## 2018-01-09 DIAGNOSIS — D169 Benign neoplasm of bone and articular cartilage, unspecified: Secondary | ICD-10-CM

## 2018-01-09 NOTE — Telephone Encounter (Signed)
Pt requesting refill on Chantix.

## 2018-01-09 NOTE — Progress Notes (Signed)
Subjective:   Patient ID: Rebecca Gordon, female   DOB: 62 y.o.   MRN: [id number]   HPI Patient presents stating that it still been very tender on top of my foot and the medication did not shrink it in the hot compresses or not working either and it makes it hard to wear shoe gear   ROS      Objective:  Physical Exam  Neurovascular status intact with patient found to have a mass in the dorsal of the right midfoot area medial side over the extensor hallucis tendon that measures approximately 5 x 5 mm it appears to be freely movable within subcutaneous tissue and is painful when palpated     Assessment:  Chronic nodular lesion which did not respond to draining steroid or hot compress therapy with pain associated with it     Plan:  Reviewed condition at great length and due to the continued pain I recommended removal of the mass and explained procedure and allowed her to read consent form going over alternative treatments complications.  Patient wants surgery on this understanding the risk involved and is scheduled for outpatient surgery and understands total recovery will take several months and we will find out what it is based on the pathology that is presented and hopefully will not have history of the extensor tendon.  Patient signed consent form and is given all preoperative instructions and is encouraged to call with questions

## 2018-01-09 NOTE — Patient Instructions (Addendum)
Pre-Operative Instructions  Congratulations, you have decided to take an important step towards improving your quality of life.  You can be assured that the doctors and staff at [facility] will be with you every step of the way.  Here are some important things you should know:  1. Plan to be at the surgery center/hospital at least 1 (one) hour prior to your scheduled time, unless otherwise directed by the surgical center/hospital staff.  You must have a responsible adult accompany you, remain during the surgery and drive you home.  Make sure you have directions to the surgical center/hospital to ensure you arrive on time. 2. If you are having surgery at [facility] or [facility] hospitals, you will need a copy of your medical history and physical form from your family physician within one month prior to the date of surgery. We will give you a form for your primary physician to complete.  3. We make every effort to accommodate the date you request for surgery.  However, there are times where surgery dates or times have to be moved.  We will contact you as soon as possible if a change in schedule is required.   4. No aspirin/ibuprofen for one week before surgery.  If you are on aspirin, any non-steroidal anti-inflammatory medications (Mobic, Aleve, Ibuprofen) should not be taken seven (7) days prior to your surgery.  You make take Tylenol for pain prior to surgery.  5. Medications - If you are taking daily heart and blood pressure medications, seizure, reflux, allergy, asthma, anxiety, pain or diabetes medications, make sure you notify the surgery center/hospital before the day of surgery so they can tell you which medications you should take or avoid the day of surgery. 6. No food or drink after midnight the night before surgery unless directed otherwise by surgical center/hospital staff. 7. No alcoholic beverages 24-hours prior to surgery.  No smoking 24-hours prior or 24-hours after  surgery. 8. Wear loose pants or shorts. They should be loose enough to fit over bandages, boots, and casts. 9. Don't wear slip-on shoes. Sneakers are preferred. 10. Bring your boot with you to the surgery center/hospital.  Also bring crutches or a walker if your physician has prescribed it for you.  If you do not have this equipment, it will be provided for you after surgery. 11. If you have not been contacted by the surgery center/hospital by the day before your surgery, call to confirm the date and time of your surgery. 12. Leave-time from work may vary depending on the type of surgery you have.  Appropriate arrangements should be made prior to surgery with your employer. 13. Prescriptions will be provided immediately following surgery by your doctor.  Fill these as soon as possible after surgery and take the medication as directed. Pain medications will not be refilled on weekends and must be approved by the doctor. 14. Remove nail polish on the operative foot and avoid getting pedicures prior to surgery. 15. Wash the night before surgery.  The night before surgery wash the foot and leg well with water and the antibacterial soap provided. Be sure to pay special attention to beneath the toenails and in between the toes.  Wash for at least three (3) minutes. Rinse thoroughly with water and dry well with a towel.  Perform this wash unless told not to do so by your physician.  Enclosed: 1 Ice pack (please put in freezer the night before surgery)   1 Hibiclens skin cleaner     Pre-op instructions  If you have any questions regarding the instructions, please do not hesitate to call our office.  [place]: [address], , [place] [address] -- [phone]  [place]: [address] [place], [place] [address] -- [phone]  [place]: [address]  [place], [place] [address] -- [phone]  [place]: [address], [address], [place], [place] [address] -- [phone]  Website:  [email or web address] 

## 2018-01-10 ENCOUNTER — Telehealth: Payer: Self-pay | Admitting: *Deleted

## 2018-01-10 NOTE — Telephone Encounter (Signed)
Caren Griffins from the surgical center, emailed a request that we send them the patient's medication list?  I sent them the snapshot.

## 2018-01-13 DIAGNOSIS — D492 Neoplasm of unspecified behavior of bone, soft tissue, and skin: Secondary | ICD-10-CM | POA: Diagnosis not present

## 2018-01-16 ENCOUNTER — Encounter: Payer: Self-pay | Admitting: Podiatry

## 2018-01-16 NOTE — Progress Notes (Signed)
DOS 01/13/2018 Ganglion Tumor RT; Removal of Soft Tissue Mass 1st Metatarsal RT

## 2018-01-22 ENCOUNTER — Ambulatory Visit (INDEPENDENT_AMBULATORY_CARE_PROVIDER_SITE_OTHER): Payer: BLUE CROSS/BLUE SHIELD | Admitting: Podiatry

## 2018-01-22 VITALS — BP 122/67 | HR 61 | Resp 16

## 2018-01-22 DIAGNOSIS — D169 Benign neoplasm of bone and articular cartilage, unspecified: Secondary | ICD-10-CM

## 2018-01-22 DIAGNOSIS — D492 Neoplasm of unspecified behavior of bone, soft tissue, and skin: Secondary | ICD-10-CM

## 2018-01-28 NOTE — Progress Notes (Signed)
   Subjective:  Patient presents today status post excision of a ganglion cyst of the right foot. DOS: 01/13/18. She states she is doing well overall. She reports som emild pain to the incision site. She reports getting the the incision wet three days ago. There are no modifying factors noted. Patient is here for further evaluation and treatment.    Past Medical History:  Diagnosis Date  . Anxiety   . Arthritis    "right knee"  . Depression   . Former smoker 09/23/2016  . GERD (gastroesophageal reflux disease)    Nexium  . High cholesterol   . History of bronchitis   . History of pneumonia   . Hypertension   . Osteoporosis 08/2017   T score -2.7  . Panic attacks       Objective/Physical Exam Neurovascular status intact.  Skin incisions appear to be well coapted with sutures and staples intact. No sign of infectious process noted. No dehiscence. No active bleeding noted. Moderate edema noted to the surgical extremity.  Assessment: 1. s/p excision of a ganglion cyst of the right foot. DOS: 01/13/18.   Plan of Care:  1. Patient was evaluated. X-rays reviewed 2. Compression anklet dispensed.  3. Discontinue using post op shoe. Recommended good shoe gear.  4. Return to clinic in 2 weeks.    Edrick Kins, DPM [facility]  Dr. Edrick Kins, [place]                                        [place], [place] [address]                Office [phone]  Fax [phone]

## 2018-02-05 ENCOUNTER — Ambulatory Visit (INDEPENDENT_AMBULATORY_CARE_PROVIDER_SITE_OTHER): Payer: BLUE CROSS/BLUE SHIELD | Admitting: Podiatry

## 2018-02-05 ENCOUNTER — Encounter: Payer: Self-pay | Admitting: Podiatry

## 2018-02-05 DIAGNOSIS — D492 Neoplasm of unspecified behavior of bone, soft tissue, and skin: Secondary | ICD-10-CM

## 2018-02-05 NOTE — Progress Notes (Signed)
Subjective:   Patient ID: Rebecca Gordon, female   DOB: 62 y.o.   MRN: [id number]   HPI Patient states that she is doing well with the excision of lesion with no pain   ROS      Objective:  Physical Exam  Neurovascular status intact with patient's right dorsal foot showing good healing of the incision site from previous removal of mass     Assessment:  Doing well removal benign mass     Plan:  Continue to be careful with shoe gear but may return to normal activities

## 2018-02-10 ENCOUNTER — Other Ambulatory Visit: Payer: Self-pay | Admitting: Family Medicine

## 2018-02-10 NOTE — Telephone Encounter (Signed)
Copied from [place] [id number]. Topic: Quick Communication - Rx Refill/Question >> Feb 10, 2018 10:30 AM Waylan Rocher, L[place] L wrote: Medication: topiramate (TOPAMAX) 100 MG tablet, rosuvastatin (CRESTOR) 40 MG tablet   Has the patient contacted their pharmacy? Yes.   (Agent: If no, request that the patient contact the pharmacy for the refill.) (Agent: If yes, when and what did the pharmacy advise?) new pharmacy  Preferred Pharmacy (with phone number or street name): [facility] [place], [place] [address] [place] [address] Phone: [phone] Fax: [phone]  Agent: Please be advised that RX refills may take up to 3 business days. We ask that you follow-up with your pharmacy.

## 2018-02-11 MED ORDER — TOPIRAMATE 100 MG PO TABS: 100.0000 mg | ORAL_TABLET | Freq: Every day | ORAL | 1 refills | Status: DC

## 2018-02-11 MED ORDER — ROSUVASTATIN CALCIUM 40 MG PO TABS: 40.0000 mg | ORAL_TABLET | Freq: Every day | ORAL | 1 refills | Status: DC

## 2018-02-11 NOTE — Telephone Encounter (Signed)
Requested medication (s) are due for refill today: Rosuvastatin yes  Requested medication (s) are on the active medication list: yes  Last refill:  05/14/17  Future visit scheduled: yes  Notes to clinic:  Historical provider    Requested medication (s) are due for refill today: Topiramate yes  Requested medication (s) are on the active medication list: yes  Last refill:  11/06/27  Future visit scheduled: yes  Notes to clinic:  undelegated    Requested Prescriptions  Pending Prescriptions Disp Refills   rosuvastatin (CRESTOR) 40 MG tablet  3    Sig: Take 1 tablet (40 mg total) by mouth daily.     Cardiovascular:  Antilipid - Statins Passed - 02/10/2018  1:33 PM      Passed - Total Cholesterol in normal range and within 360 days    Cholesterol  Date Value Ref Range Status  08/09/2017 113 0 - 200 mg/dL Final    Comment:    ATP III Classification       Desirable:  < 200 mg/dL               Borderline High:  200 - 239 mg/dL          High:  > = 240 mg/dL         Passed - LDL in normal range and within 360 days    LDL Cholesterol  Date Value Ref Range Status  08/09/2017 54 0 - 99 mg/dL Final         Passed - HDL in normal range and within 360 days    HDL  Date Value Ref Range Status  08/09/2017 43.60 >39.00 mg/dL Final         Passed - Triglycerides in normal range and within 360 days    Triglycerides  Date Value Ref Range Status  08/09/2017 80.0 0.0 - 149.0 mg/dL Final    Comment:    Normal:  <150 mg/dLBorderline High:  150 - 199 mg/dL         Passed - Patient is not pregnant      Passed - Valid encounter within last 12 months    Recent Outpatient Visits          1 month ago Internal hemorrhoids   [facility] [place], [place], DO   5 months ago Leukocytosis, unspecified type   [facility] [place], DO   6 months ago Preventative health care   [place] [facility] [place], [place], DO   1 year ago Essential hypertension   [facility] [place], DO   1 year ago Transient cerebral ischemia, unspecified type   [facility] at [place], DO      Future Appointments            In 1 month [name], Alferd Apa, DO [place] at [facility], [facility]          topiramate (TOPAMAX) 100 MG tablet 90 tablet 0    Sig: TAKE 1 TABLET BY MOUTH EVERYDAY AT BEDTIME     Not Delegated - Neurology: Anticonvulsants - topiramate & zonisamide Failed - 02/10/2018  1:33 PM      Failed - This refill cannot be delegated      Passed - Cr in  normal range and within 360 days    Creatinine, Ser  Date Value Ref Range Status  08/09/2017 0.66 0.40 - 1.20 mg/dL Final         Passed - C02 in normal range and within 360 days    CO2  Date Value Ref Range Status  08/09/2017 29 19 - 32 mEq/L Final         Passed - Valid encounter within last 12 months    Recent Outpatient Visits          1 month ago Internal hemorrhoids   [facility] at [place], DO   5 months ago Leukocytosis, unspecified type   [facility] at [place], DO   6 months ago Preventative health care   [place] [facility] [place], [place], DO   1 year ago Essential hypertension   [facility] at [place], DO   1 year ago Transient cerebral ischemia, unspecified type   [facility] at [place], DO      Future Appointments            In 1 month [place], [place] [facility], [facility]

## 2018-02-18 DIAGNOSIS — K5903 Drug induced constipation: Secondary | ICD-10-CM | POA: Diagnosis not present

## 2018-02-18 DIAGNOSIS — K600 Acute anal fissure: Secondary | ICD-10-CM | POA: Diagnosis not present

## 2018-02-18 DIAGNOSIS — R194 Change in bowel habit: Secondary | ICD-10-CM | POA: Diagnosis not present

## 2018-02-18 DIAGNOSIS — Z800 Family history of malignant neoplasm of digestive organs: Secondary | ICD-10-CM | POA: Diagnosis not present

## 2018-02-19 ENCOUNTER — Encounter: Payer: BLUE CROSS/BLUE SHIELD | Admitting: Podiatry

## 2018-03-07 ENCOUNTER — Telehealth: Payer: Self-pay | Admitting: *Deleted

## 2018-03-07 NOTE — Telephone Encounter (Signed)
Prolia insurance verification has been sent awaiting Summary of benefits  

## 2018-03-17 NOTE — Telephone Encounter (Addendum)
Deductible Amount Met  OOP MAX $7900 (100met)  Annual exam 09/26/17 ML  Calcium  9.3    Date 08/09/17  Upcoming dental procedures   Prior Authorization needed YES in process  Pt estimated Cost $0       Coverage Details: $0 one dose,$0 admin fee  

## 2018-03-20 NOTE — Telephone Encounter (Signed)
Received a denial letter from [place] to [place].. Pt has changed insurance companies since last injection.   Denied due to pt has not tried an alterante medication.   Will route to DR. Lavoie for advice.

## 2018-03-21 NOTE — Telephone Encounter (Signed)
Recommend trying Fosamax 70 mg PO weekly.  Can schedule with me if wants to discuss.

## 2018-03-24 ENCOUNTER — Encounter: Payer: Self-pay | Admitting: Family Medicine

## 2018-03-24 ENCOUNTER — Ambulatory Visit: Payer: BLUE CROSS/BLUE SHIELD | Admitting: Family Medicine

## 2018-03-24 VITALS — BP 106/60 | HR 67 | Temp 98.2°F | Resp 16 | Ht 63.0 in | Wt 135.4 lb

## 2018-03-24 DIAGNOSIS — E785 Hyperlipidemia, unspecified: Secondary | ICD-10-CM | POA: Diagnosis not present

## 2018-03-24 DIAGNOSIS — S3669XA Other injury of rectum, initial encounter: Secondary | ICD-10-CM | POA: Insufficient documentation

## 2018-03-24 DIAGNOSIS — F411 Generalized anxiety disorder: Secondary | ICD-10-CM

## 2018-03-24 DIAGNOSIS — I10 Essential (primary) hypertension: Secondary | ICD-10-CM

## 2018-03-24 DIAGNOSIS — F17200 Nicotine dependence, unspecified, uncomplicated: Secondary | ICD-10-CM

## 2018-03-24 DIAGNOSIS — Z23 Encounter for immunization: Secondary | ICD-10-CM

## 2018-03-24 LAB — COMPREHENSIVE METABOLIC PANEL
ALT: 16 U/L (ref 0–35)
AST: 10 U/L (ref 0–37)
Albumin: 4.2 g/dL (ref 3.5–5.2)
Alkaline Phosphatase: 46 U/L (ref 39–117)
BUN: 22 mg/dL (ref 6–23)
CO2: 28 mEq/L (ref 19–32)
Calcium: 9.3 mg/dL (ref 8.4–10.5)
Chloride: 104 mEq/L (ref 96–112)
Creatinine, Ser: 0.62 mg/dL (ref 0.40–1.20)
GFR: 103.55 mL/min (ref >60.00)
Glucose, Bld: 98 mg/dL (ref 70–99)
POTASSIUM: 4 meq/L (ref 3.5–5.1)
Sodium: 140 mEq/L (ref 135–145)
TOTAL PROTEIN: 6.2 g/dL (ref 6.0–8.3)
Total Bilirubin: 0.6 mg/dL (ref 0.2–1.2)

## 2018-03-24 LAB — LIPID PANEL
Cholesterol: 117 mg/dL (ref 0–200)
HDL: 49.1 mg/dL (ref >39.00)
LDL Cholesterol: 58 mg/dL (ref 0–99)
NonHDL: 67.53
Total CHOL/HDL Ratio: 2
Triglycerides: 50 mg/dL (ref 0.0–149.0)
VLDL: 10 mg/dL (ref 0.0–40.0)

## 2018-03-24 NOTE — Assessment & Plan Note (Signed)
Tolerating statin, encouraged heart healthy diet, avoid trans fats, minimize simple carbs and saturated fats. Increase exercise as tolerated 

## 2018-03-24 NOTE — Progress Notes (Signed)
Patient ID: Rebecca Gordon, female    DOB: [birth year]  Age: 62 y.o. MRN: [id number]    Subjective:  Subjective  HPI [name] presents for f/u bp and hyperlipidemia  Pt is seeing Dr Collene Mares for a rectal tear and is not happy with the results.    Review of Systems  Constitutional: Negative for appetite change, diaphoresis, fatigue and unexpected weight change.  Eyes: Negative for pain, redness and visual disturbance.  Respiratory: Negative for cough, chest tightness, shortness of breath and wheezing.   Cardiovascular: Negative for chest pain, palpitations and leg swelling.  Gastrointestinal: Positive for rectal pain. Negative for blood in stool, constipation and diarrhea.  Endocrine: Negative for cold intolerance, heat intolerance, polydipsia, polyphagia and polyuria.  Genitourinary: Negative for difficulty urinating, dysuria and frequency.  Neurological: Negative for dizziness, light-headedness, numbness and headaches.    History Past Medical History:  Diagnosis Date  . Anxiety   . Arthritis    "right knee"  . Depression   . Former smoker 09/23/2016  . GERD (gastroesophageal reflux disease)    Nexium  . High cholesterol   . History of bronchitis   . History of pneumonia   . Hypertension   . Osteoporosis 08/2017   T score -2.7  . Panic attacks     She has a past surgical history that includes Tonsillectomy; Clavicle surgery (Right); Placement of breast implants (Bilateral); Colonoscopy; Eye surgery (Right); and ORIF clavicular fracture (Right, 04/28/2015).   Her family history includes Brain cancer in her father; Heart failure in her mother; Hypertension in her father; Prostate cancer in her father; Skin cancer in her father.She reports that she has been smoking. She has a 60.00 pack-year smoking history. She has never used smokeless tobacco. She reports that she does not drink alcohol or use drugs.  Current Outpatient Medications on File Prior to Visit  Medication Sig  Dispense Refill  . amLODipine (NORVASC) 10 MG tablet TAKE 1 TABLET BY MOUTH EVERY DAY 90 tablet 3  . aspirin 325 MG tablet Take 1 tablet (325 mg total) by mouth daily.    Marland Kitchen b complex vitamins tablet Take 1 tablet by mouth daily.    . cholecalciferol (VITAMIN D) 1000 units tablet Take 1,000 Units by mouth 2 (two) times daily.    Marland Kitchen desvenlafaxine (PRISTIQ) 50 MG 24 hr tablet Take 1 tablet (50 mg total) by mouth daily. 90 tablet 3  . fluticasone (FLONASE) 50 MCG/ACT nasal spray Place 2 sprays into both nostrils daily. 16 g 6  . hydrochlorothiazide (MICROZIDE) 12.5 MG capsule TAKE 1 CAPSULE BY MOUTH EVERY DAY 90 capsule 3  . losartan (COZAAR) 100 MG tablet TAKE 1 TABLET BY MOUTH EVERY DAY 90 tablet 3  . metoprolol succinate (TOPROL-XL) 100 MG 24 hr tablet TAKE 1 TABLET BY MOUTH DAILY. TAKE WITH OR IMMEDIATELY FOLLOWING A MEAL. 90 tablet 1  . Omega 3 1200 MG CAPS Take 1,200 mg by mouth 2 (two) times daily.    Marland Kitchen omeprazole (PRILOSEC) 20 MG capsule Take 20 mg by mouth daily.    . rosuvastatin (CRESTOR) 40 MG tablet Take 1 tablet (40 mg total) by mouth daily. 90 tablet 1  . topiramate (TOPAMAX) 100 MG tablet Take 1 tablet (100 mg total) by mouth daily. TAKE 1 TABLET BY MOUTH EVERYDAY AT BEDTIME 90 tablet 1  . traZODone (DESYREL) 50 MG tablet TAKE 1 TABLET (50 MG TOTAL) BY MOUTH AT BEDTIME AND MAY REPEAT DOSE ONE TIME IF NEEDED FOR 30 DOSES.  1  . TURMERIC PO Take 1 capsule by mouth 2 (two) times daily.    . varenicline (CHANTIX STARTING MONTH PAK) 0.5 MG X 11 & 1 MG X 42 tablet TAKE 0.5MG  DAILY X 3, THEN 0.5MG  TWICE DAILY X 4, THEN 1MG  TWICE A DAY. 60 tablet 0   No current facility-administered medications on file prior to visit.      Objective:  Objective  Physical Exam  Constitutional: She is oriented to person, place, and time. She appears well-developed and well-nourished.  HENT:  Head: Normocephalic and atraumatic.  Eyes: Conjunctivae and EOM are normal.  Neck: Normal range of motion. Neck  supple. No JVD present. Carotid bruit is not present. No thyromegaly present.  Cardiovascular: Normal rate, regular rhythm and normal heart sounds.  No murmur heard. Pulmonary/Chest: Effort normal and breath sounds normal. No respiratory distress. She has no wheezes. She has no rales. She exhibits no tenderness.  Musculoskeletal: She exhibits no edema.  Neurological: She is alert and oriented to person, place, and time.  Psychiatric: She has a normal mood and affect.  Nursing note and vitals reviewed.  BP 106/60 (BP Location: Right Arm, Cuff Size: Normal)   Pulse 67   Temp 98.2 F (36.8 C) (Oral)   Resp 16   Ht 5\' 3"  (1.6 m)   Wt 135 lb 6.4 oz (61.4 kg)   SpO2 98%   BMI 23.99 kg/m  Wt Readings from Last 3 Encounters:  03/24/18 135 lb 6.4 oz (61.4 kg)  12/19/17 128 lb 6.4 oz (58.2 kg)  09/06/17 132 lb 12.8 oz (60.2 kg)     Lab Results  Component Value Date   WBC 9.1 09/06/2017   HGB 14.2 09/06/2017   HCT 41.9 09/06/2017   PLT 250.0 09/06/2017   GLUCOSE 98 03/24/2018   CHOL 117 03/24/2018   TRIG 50.0 03/24/2018   HDL 49.10 03/24/2018   LDLCALC 58 03/24/2018   ALT 16 03/24/2018   AST 10 03/24/2018   NA 140 03/24/2018   K 4.0 03/24/2018   CL 104 03/24/2018   CREATININE 0.62 03/24/2018   BUN 22 03/24/2018   CO2 28 03/24/2018   TSH 1.06 08/09/2017   INR 0.91 09/22/2016   HGBA1C 5.5 09/23/2016    US Thyroid  Result Date: 08/21/2017 CLINICAL DATA:  Palpable abnormality.  Possible left thyroid nodule. EXAM: THYROID ULTRASOUND TECHNIQUE: Ultrasound examination of the thyroid gland and adjacent soft tissues was performed. COMPARISON:  None. FINDINGS: Parenchymal Echotexture: Moderately heterogenous Isthmus: 0.1 cm Right lobe: 4.9 x 1.8 x 1.9 cm Left lobe: 4.5 x 1.9 x 1.8 cm _________________________________________________________ Estimated total number of nodules >/= 1 cm: 0 Number of spongiform nodules >/=  2 cm not described below (TR1): 0 Number of mixed cystic and solid  nodules >/= 1.5 cm not described below (TR2): 0 _________________________________________________________ Several nodules bilaterally measure 0.9 cm or less and do not meet criteria for biopsy nor follow-up. IMPRESSION: Multiple bilateral nodules measure 0.9 cm or less and do not meet criteria for biopsy nor follow-up. The above is in keeping with the ACR TI-RADS recommendations - J Am Coll Radiol 2017;14:587-595. Electronically Signed   By: Marybelle Killings M.D.   On: 08/21/2017 11:51     Assessment & Plan:  Plan  I have discontinued Scharlene Corn Dutkiewicz's Calcium Carbonate-Vit D-Min (CALCIUM 1200 PO), hydrocortisone, and HYDROcodone-acetaminophen. I am also having her maintain her cholecalciferol, Omega 3, TURMERIC PO, aspirin, fluticasone, omeprazole, b complex vitamins, desvenlafaxine, losartan, hydrochlorothiazide, amLODipine, traZODone, metoprolol  succinate, varenicline, rosuvastatin, and topiramate.  No orders of the defined types were placed in this encounter.   Problem List Items Addressed This Visit      Unprioritized   Essential hypertension   Relevant Orders   Lipid panel (Completed)   Comprehensive metabolic panel (Completed)   Hyperlipidemia LDL goal <100 - Primary   Relevant Orders   Lipid panel (Completed)   Comprehensive metabolic panel (Completed)    Other Visit Diagnoses    Rectal tear       Relevant Orders   Ambulatory referral to Gastroenterology   Need for shingles vaccine       Relevant Orders   Varicella-zoster vaccine IM (Shingrix) (Completed)      Follow-up: Return in about 6 months (around 09/23/2018), or if symptoms worsen or fail to improve, for annual exam, fasting.  [name], DO

## 2018-03-24 NOTE — Assessment & Plan Note (Signed)
con't diltiazem top Refer to GI at pt request  She will have records sent

## 2018-03-24 NOTE — Assessment & Plan Note (Signed)
Well controlled, no changes to meds. Encouraged heart healthy diet such as the DASH diet and exercise as tolerated.  °

## 2018-03-24 NOTE — Patient Instructions (Signed)

## 2018-03-24 NOTE — Assessment & Plan Note (Signed)
Stable con't meds 

## 2018-03-24 NOTE — Assessment & Plan Note (Signed)
Encouraged heart healthy diet, increase exercise, avoid trans fats, consider a krill oil cap daily 

## 2018-03-24 NOTE — Assessment & Plan Note (Signed)
Has quit with chantix

## 2018-03-31 NOTE — Telephone Encounter (Signed)
Pt states she will make a appt to have more detailed information regarding Fosamax

## 2018-04-11 ENCOUNTER — Other Ambulatory Visit: Payer: Self-pay | Admitting: Family Medicine

## 2018-04-11 DIAGNOSIS — J302 Other seasonal allergic rhinitis: Secondary | ICD-10-CM

## 2018-04-14 ENCOUNTER — Telehealth: Payer: Self-pay | Admitting: *Deleted

## 2018-04-14 ENCOUNTER — Telehealth: Payer: Self-pay

## 2018-04-14 NOTE — Telephone Encounter (Signed)
Error

## 2018-04-14 NOTE — Telephone Encounter (Signed)
Copied from [place] [id number]. Topic: Referral - Status >> Apr 14, 2018 12:13 PM Ivar Drape wrote: Reason for CRM:   Patient is checking on the status of the 03/24/18 referral to [facility].  She needs this ASAP.  They needs the referral and her records.

## 2018-04-14 NOTE — Telephone Encounter (Signed)
Spoke with patient on the phone and she stated that her understanding was that we were suppose to get records from Dr. Collene Mares.  Advised that I will call Dr. Youlanda Mighty office for records.    Called to request records.  Will call her once they are received.

## 2018-04-14 NOTE — Telephone Encounter (Signed)
Left message on machine that it looks like they spoke with her on 03/25/18 and was requesting records from Dr. Collene Mares and that she will need to contact their office ([facility]) to check to see if they have received the records.

## 2018-04-15 NOTE — Telephone Encounter (Signed)
Patient notified records was sent to [name] and that they will be calling her.

## 2018-04-18 ENCOUNTER — Encounter: Payer: Self-pay | Admitting: Obstetrics & Gynecology

## 2018-04-18 ENCOUNTER — Ambulatory Visit: Payer: BLUE CROSS/BLUE SHIELD | Admitting: Obstetrics & Gynecology

## 2018-04-18 VITALS — BP 112/76

## 2018-04-18 DIAGNOSIS — K5904 Chronic idiopathic constipation: Secondary | ICD-10-CM

## 2018-04-18 DIAGNOSIS — N950 Postmenopausal bleeding: Secondary | ICD-10-CM | POA: Diagnosis not present

## 2018-04-18 DIAGNOSIS — M810 Age-related osteoporosis without current pathological fracture: Secondary | ICD-10-CM | POA: Diagnosis not present

## 2018-04-18 MED ORDER — ALENDRONATE SODIUM 70 MG PO TABS: 70.0000 mg | ORAL_TABLET | ORAL | 4 refills | Status: DC

## 2018-04-18 NOTE — Progress Notes (Signed)
    Rebecca Gordon [place] 1955-09-04 [id number]        63 y.o.  G2P2L2 Married  RP: Osteoporosis/PMB/Constipation  HPI: PMB with spotting on-off x 1 month.  No HRT.  No pelvic pain except in association with constipation.  Osteoporosis on BD 08/2017 with a T-Score of -2.7 at the Lt Femoral neck.  Prolia prescribed, but not covered by insurance, so would like to discuss alternative therapies.  Off alcohol x 90 days.  Decreased cigarette smoking to 1 pack per week.   OB History  Gravida Para Term Preterm AB Living  2 2       2   SAB TAB Ectopic Multiple Live Births               # Outcome Date GA Lbr Len/2nd Weight Sex Delivery Anes PTL Lv  2 Para           1 Para             Past medical history,surgical history, problem list, medications, allergies, family history and social history were all reviewed and documented in the EPIC chart.   Directed ROS with pertinent positives and negatives documented in the history of present illness/assessment and plan.  Exam:  Vitals:   04/18/18 1501  BP: 112/76   General appearance:  Normal  Abdomen: Normal  Gynecologic exam: Vulva normal.  Speculum:  Cervix, vagina normal.  No cervical polyp, no lesion.  No bleeding.  Bimanual exam:  Uterus AV, normal volume, NT, mobile.  No adnexal mass, NT.   Assessment/Plan:  63 y.o. G2P2   1. Postmenopausal bleeding Menopause on no HRT.  Mild PMB on-off x 1 month.  Pelvic exam negative today.  Will f/u to assess the Endometrial line by Pelvic US. - US Transvaginal Non-OB; Future  2. Age-related osteoporosis without current pathological fracture Osteoporosis T-Score -2.7.  Prolia not covered.  Counseling on Fosamax done.  Usage reviewed.  Prescription sent to pharmacy.  3. Chronic idiopathic constipation Counseling on nutrition to favor normal soft BMs.  Recommend regular physical activities like walking.  Increase water intake.  Other orders - alendronate (FOSAMAX) 70 MG tablet; Take 1 tablet (70 mg  total) by mouth every 7 (seven) days. Take with a full glass of water on an empty stomach.  Counseling on above issues and coordination of care more than 50% for 25 minutes.  Princess Bruins MD, 3:11 PM 04/18/2018

## 2018-04-25 ENCOUNTER — Encounter: Payer: Self-pay | Admitting: Obstetrics & Gynecology

## 2018-04-25 ENCOUNTER — Encounter: Payer: Self-pay | Admitting: Family Medicine

## 2018-04-25 NOTE — Patient Instructions (Signed)
1. Postmenopausal bleeding Menopause on no HRT.  Mild PMB on-off x 1 month.  Pelvic exam negative today.  Will f/u to assess the Endometrial line by Pelvic US. - US Transvaginal Non-OB; Future  2. Age-related osteoporosis without current pathological fracture Osteoporosis T-Score -2.7.  Prolia not covered.  Counseling on Fosamax done.  Usage reviewed.  Prescription sent to pharmacy.  3. Chronic idiopathic constipation Counseling on nutrition to favor normal soft BMs.  Recommend regular physical activities like walking.  Increase water intake.  Other orders - alendronate (FOSAMAX) 70 MG tablet; Take 1 tablet (70 mg total) by mouth every 7 (seven) days. Take with a full glass of water on an empty stomach.  Rebecca Gordon, it was a pleasure seeing you today!   Constipation, Adult Constipation is when a person has fewer bowel movements in a week than normal, has difficulty having a bowel movement, or has stools that are dry, hard, or larger than normal. Constipation may be caused by an underlying condition. It may become worse with age if a person takes certain medicines and does not take in enough fluids. Follow these instructions at home: Eating and drinking   Eat foods that have a lot of fiber, such as fresh fruits and vegetables, whole grains, and beans.  Limit foods that are high in fat, low in fiber, or overly processed, such as french fries, hamburgers, cookies, candies, and soda.  Drink enough fluid to keep your urine clear or pale yellow. General instructions  Exercise regularly or as told by your health care provider.  Go to the restroom when you have the urge to go. Do not hold it in.  Take over-the-counter and prescription medicines only as told by your health care provider. These include any fiber supplements.  Practice pelvic floor retraining exercises, such as deep breathing while relaxing the lower abdomen and pelvic floor relaxation during bowel movements.  Watch your  condition for any changes.  Keep all follow-up visits as told by your health care provider. This is important. Contact a health care provider if:  You have pain that gets worse.  You have a fever.  You do not have a bowel movement after 4 days.  You vomit.  You are not hungry.  You lose weight.  You are bleeding from the anus.  You have thin, pencil-like stools. Get help right away if:  You have a fever and your symptoms suddenly get worse.  You leak stool or have blood in your stool.  Your abdomen is bloated.  You have severe pain in your abdomen.  You feel dizzy or you faint. This information is not intended to replace advice given to you by your health care provider. Make sure you discuss any questions you have with your health care provider. Document Released: 12/30/2003 Document Revised: 10/21/2015 Document Reviewed: 09/21/2015 Elsevier Interactive Patient Education  2019 [employer].

## 2018-04-28 ENCOUNTER — Telehealth: Payer: Self-pay | Admitting: *Deleted

## 2018-04-28 ENCOUNTER — Encounter: Payer: Self-pay | Admitting: Family Medicine

## 2018-04-28 NOTE — Telephone Encounter (Signed)
Copied from [place] [id number]. Topic: General - Other >> Apr 28, 2018  1:31 PM Carolyn Stare wrote:  ;Pt ask if Philomena Doheny would return her call about referral to the GI doctor. Pt said she had been speaking with her

## 2018-04-29 NOTE — Telephone Encounter (Signed)
Spoke with patient and she sent a message in [place] and we went over that.  She do have an appointment with Dr. Collene Mares instead of [facility].  Mychart sent to [facility] for review.

## 2018-04-29 NOTE — Telephone Encounter (Signed)
Left message on machine to call back  

## 2018-04-29 NOTE — Telephone Encounter (Signed)
Relation to pt: self Call back number: [phone]  Reason for call:  Patient returning call

## 2018-05-01 ENCOUNTER — Ambulatory Visit (INDEPENDENT_AMBULATORY_CARE_PROVIDER_SITE_OTHER): Payer: BLUE CROSS/BLUE SHIELD

## 2018-05-01 ENCOUNTER — Ambulatory Visit: Payer: BLUE CROSS/BLUE SHIELD | Admitting: Obstetrics & Gynecology

## 2018-05-01 ENCOUNTER — Encounter: Payer: Self-pay | Admitting: Obstetrics & Gynecology

## 2018-05-01 VITALS — BP 122/72 | Ht 63.5 in | Wt 138.4 lb

## 2018-05-01 DIAGNOSIS — R194 Change in bowel habit: Secondary | ICD-10-CM | POA: Diagnosis not present

## 2018-05-01 DIAGNOSIS — K601 Chronic anal fissure: Secondary | ICD-10-CM | POA: Diagnosis not present

## 2018-05-01 DIAGNOSIS — N950 Postmenopausal bleeding: Secondary | ICD-10-CM

## 2018-05-01 DIAGNOSIS — R9389 Abnormal findings on diagnostic imaging of other specified body structures: Secondary | ICD-10-CM | POA: Diagnosis not present

## 2018-05-01 DIAGNOSIS — K219 Gastro-esophageal reflux disease without esophagitis: Secondary | ICD-10-CM | POA: Diagnosis not present

## 2018-05-01 DIAGNOSIS — K5904 Chronic idiopathic constipation: Secondary | ICD-10-CM | POA: Diagnosis not present

## 2018-05-01 NOTE — Patient Instructions (Signed)
1. Postmenopausal bleeding Probable endometrial polyp, will confirm with a sonohysterogram.  Pelvic ultrasound findings thoroughly reviewed with patient and sonohysterogram procedure discussed.  Patient also informed of the management plan if an intrauterine lesion corresponding to a probable polyp is found, which would be a hysteroscopy with excision of the polyp and D&C at an outpatient surgical center. - [place] Sonohysterogram; Future  2. Thickened endometrium Same as above. - [place] Sonohysterogram; Future  Rebecca Gordon, it was a pleasure seeing you today!

## 2018-05-01 NOTE — Progress Notes (Signed)
    Rebecca Gordon [place] 07-Jun-1955 [id number]        63 y.o.  G2P2L2  RP: PMB for Pelvic US  HPI: Mild postmenopausal bleeding.  Feels bloated, but no severe pelvic pain.   OB History  Gravida Para Term Preterm AB Living  2 2       2   SAB TAB Ectopic Multiple Live Births               # Outcome Date GA Lbr Len/2nd Weight Sex Delivery Anes PTL Lv  2 Para           1 Para             Past medical history,surgical history, problem list, medications, allergies, family history and social history were all reviewed and documented in the EPIC chart.   Directed ROS with pertinent positives and negatives documented in the history of present illness/assessment and plan.  Exam:  Vitals:   05/01/18 1023  BP: 122/72  Weight: 138 lb 6.4 oz (62.8 kg)  Height: 5' 3.5" (1.613 m)   General appearance:  Normal  Pelvic US today: T/V images.  Anteverted uterus homogeneous, measuring 6.92 x 4.09 x 2.95 cm.  Thickened endometrium at 7 mm with suspected 1.7 x 7 mm endometrial mass with a feeder vessel identified anteriorly.  Both ovaries small with atrophic appearance.  No free fluid in the posterior cul-de-sac.   Assessment/Plan:  63 y.o. G2P2   1. Postmenopausal bleeding Probable endometrial polyp, will confirm with a sonohysterogram.  Pelvic ultrasound findings thoroughly reviewed with patient and sonohysterogram procedure discussed.  Patient also informed of the management plan if an intrauterine lesion corresponding to a probable polyp is found, which would be a hysteroscopy with excision of the polyp and D&C at an outpatient surgical center. - [place] Sonohysterogram; Future  2. Thickened endometrium Same as above. - [place] Sonohysterogram; Future  Counseling on above issues and coordination of care more than 50% for 15 minutes.  Princess Bruins MD, 10:49 AM 05/01/2018

## 2018-05-06 IMAGING — MR MR HEAD W/O CM
9 of 10 series · 35 of 48 positions shown · non-contrast
Comparison: Comparison made with prior CT from earlier same day.

CLINICAL DATA: Initial evaluation for acute left-sided facial
numbness with left arm numbness. Difficulty speaking.

EXAM:
MRI HEAD WITHOUT CONTRAST
TECHNIQUE: Multiplanar, multiecho pulse sequences of the brain and surrounding
structures were obtained without intravenous contrast.

[Series 3: FLAIR · sagittal · 5.0mm · 0.47mm/px · 2 of 23 slices shown (1 of 2)]
[im 1/23]
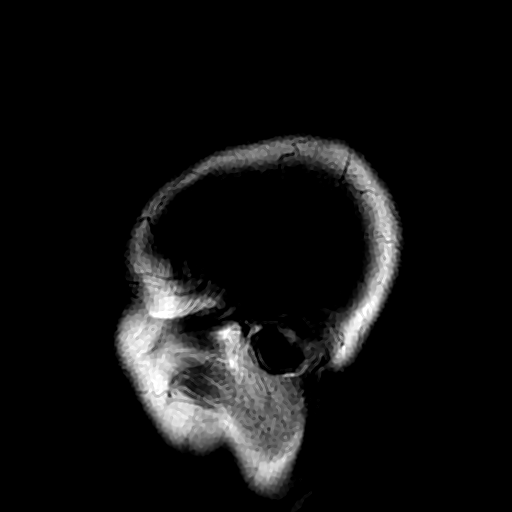
[im 23/23]
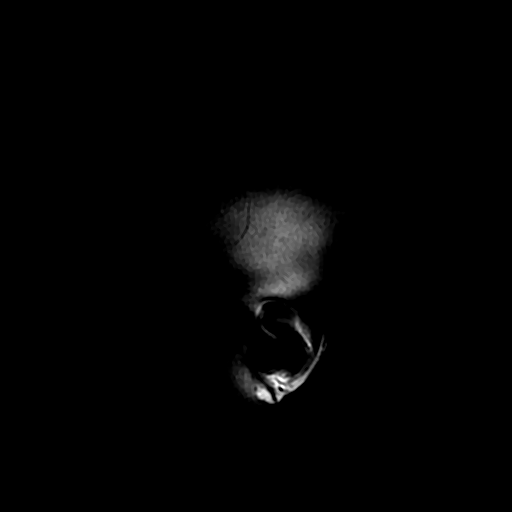

[Series 5: DWI · axial · 3.0mm · 0.94mm/px · z∈[-93,+56]mm · 9 of 103 slices shown (1 of 2)]
[im 1/103]
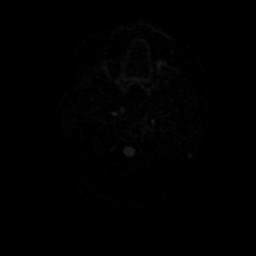
[im 13/103]
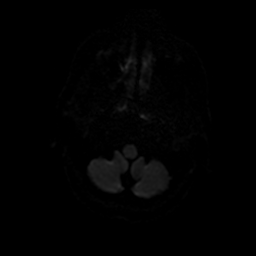
[im 26/103]
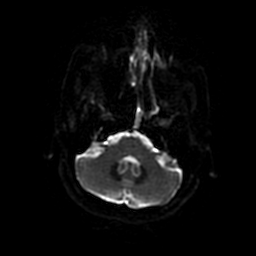
[im 39/103]
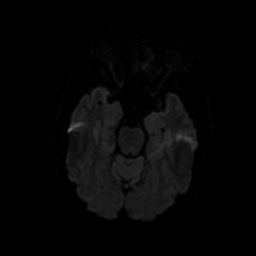
[im 52/103]
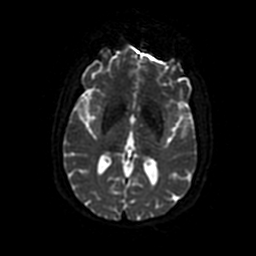
[im 64/103]
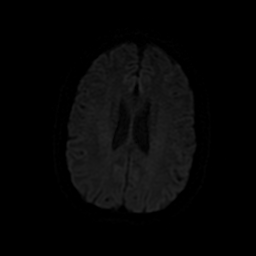
[im 77/103]
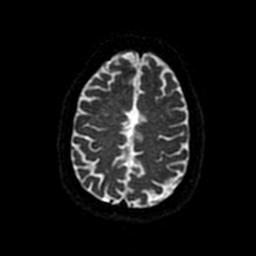
[im 90/103]
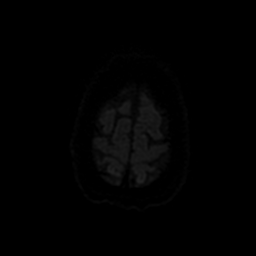
[im 103/103]
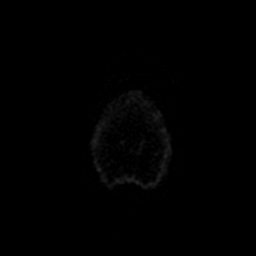

[Series 6: T2 · axial · 5.0mm · 0.47mm/px · z∈[-92,+55]mm · 2 of 26 slices shown (1 of 2)]
[im 1/26]
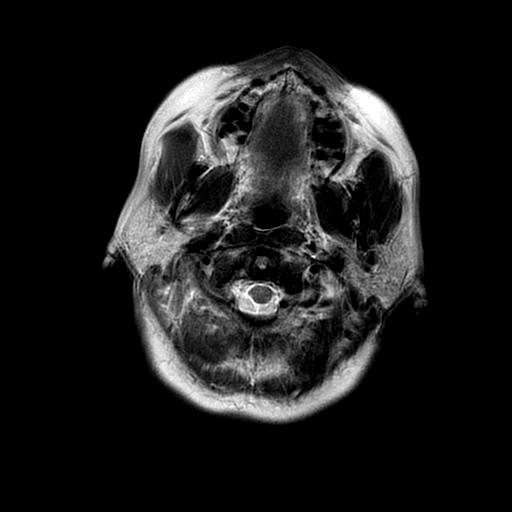
[im 26/26]
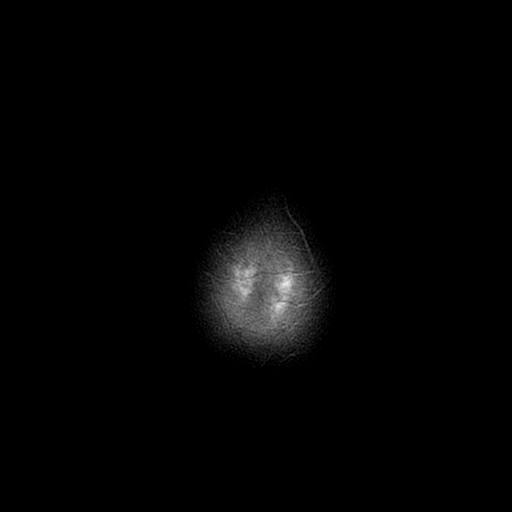

[Series 7: FLAIR · axial · 5.0mm · 0.47mm/px · z∈[-92,+55]mm · 2 of 26 slices shown (2 of 2)]
[im 1/26]
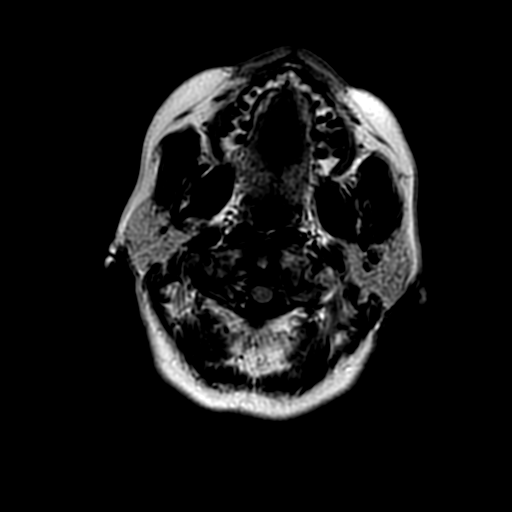
[im 26/26]
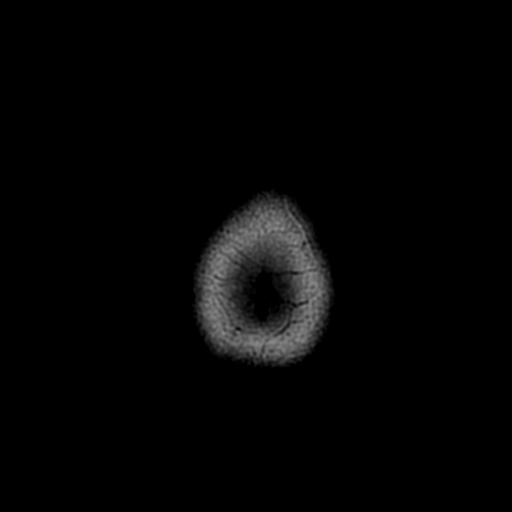

[Series 8: DWI · coronal · 4.0mm · 0.94mm/px · 7 of 72 slices shown (2 of 2)]
[im 1/72]
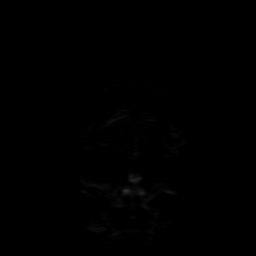
[im 12/72]
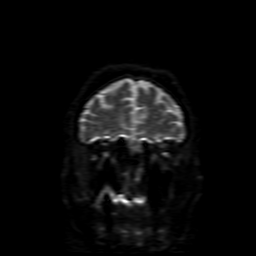
[im 24/72]
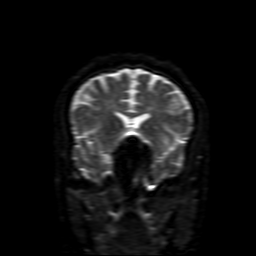
[im 36/72]
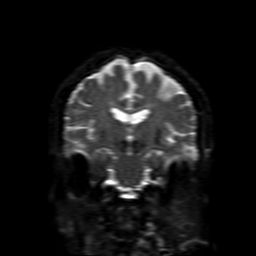
[im 48/72]
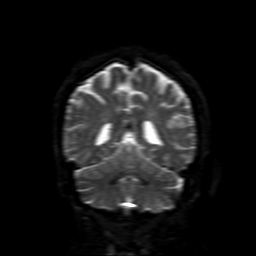
[im 60/72]
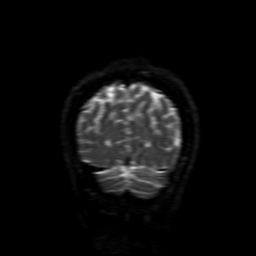
[im 72/72]
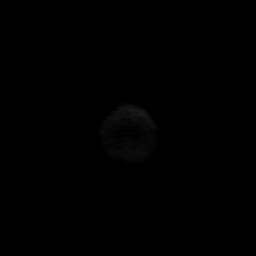

[Series 9: (person_name) · axial · 3.0mm · 0.47mm/px · z∈[-93,-77]mm · 2 of 104 slices shown]
[im 1/104]
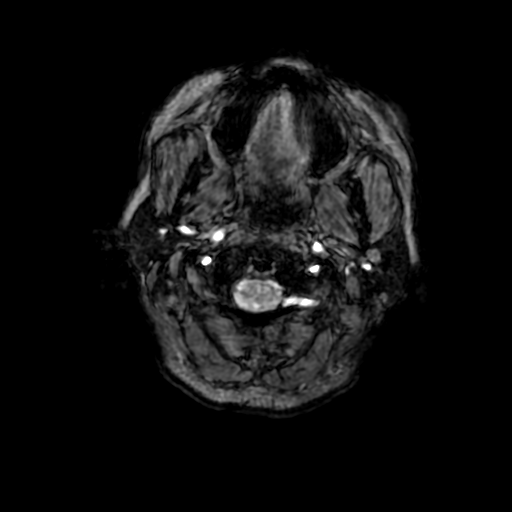
[im 12/104]
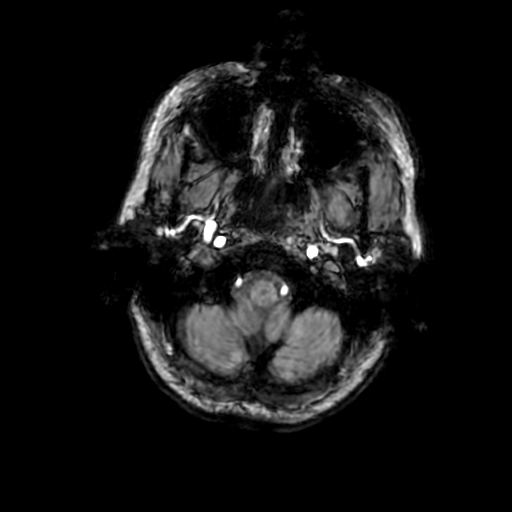

[Series 11: T2 · coronal · 5.0mm · 0.47mm/px · 3 of 30 slices shown (2 of 2)]
[im 1/30]
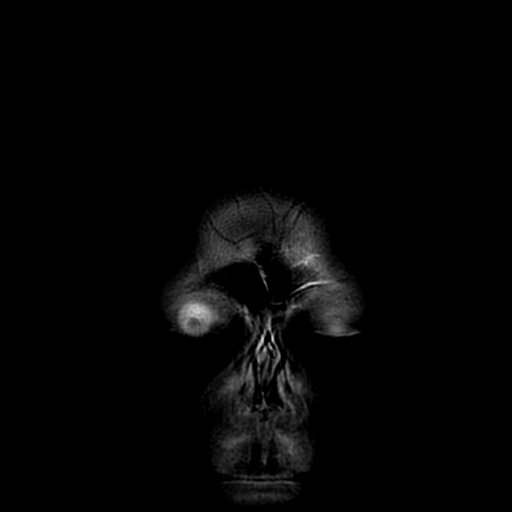
[im 15/30]
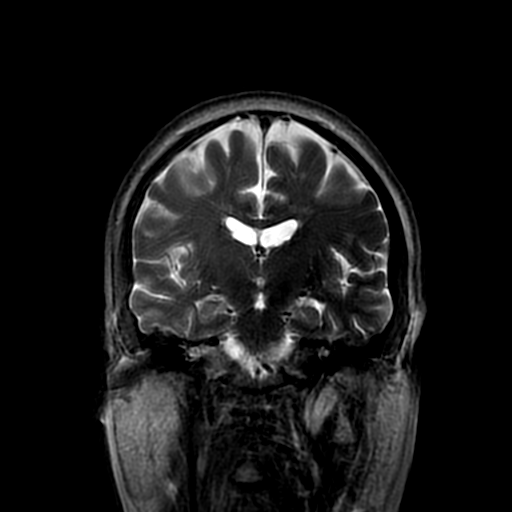
[im 30/30]
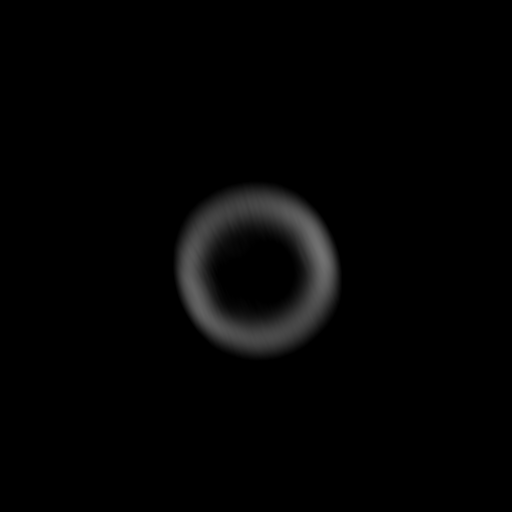

[Series 550: ADC · axial · 3.0mm · 0.94mm/px · z∈[-93,+56]mm · 5 of 52 slices shown (1 of 2)]
[im 1/52]
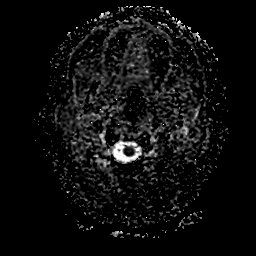
[im 13/52]
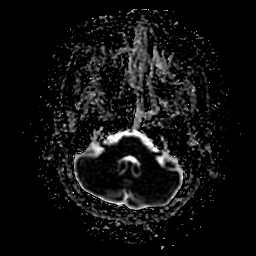
[im 26/52]
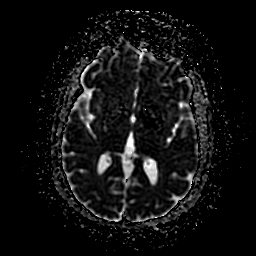
[im 39/52]
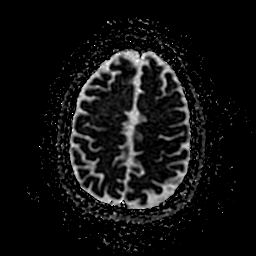
[im 52/52]
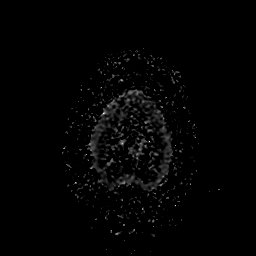

[Series 850: ADC · coronal · 4.0mm · 0.94mm/px · 3 of 36 slices shown (2 of 2)]
[im 1/36]
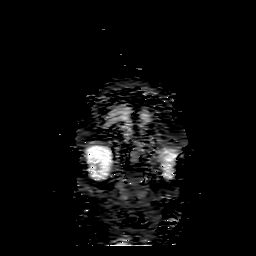
[im 18/36]
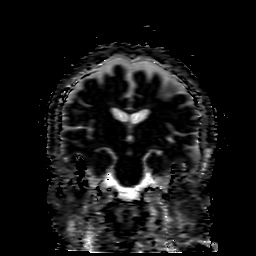
[im 36/36]
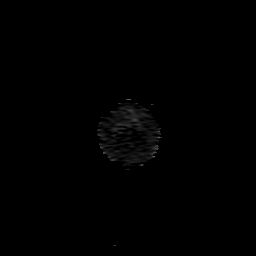

[35 of 48 positions shown; findings below may reference images not displayed]

FINDINGS: Brain: Cerebral volume within normal limits for age. Scattered
patchy T2/FLAIR hyperintense foci noted within the periventricular,
deep, and subcortical white matter both cerebral hemispheres,
nonspecific, but most likely related chronic small vessel ischemic
disease, mild for age.

No abnormal foci of restricted diffusion to suggest acute or
subacute ischemia. Gray-white matter differentiation maintained. No
areas of chronic infarction identified. No evidence for acute or
chronic intracranial hemorrhage.

No mass lesion, midline shift, or mass effect. Ventricles normal
size without evidence for hydrocephalus. No extra-axial fluid
collection. Major dural sinuses are grossly patent.

Pituitary gland suprasellar region normal. Midline structures intact
and normal.

Vascular: Major intracranial vascular flow voids are well
maintained.

Skull and upper cervical spine: Craniocervical junction within
normal limits. Visualized upper cervical spine unremarkable. Bone
marrow signal intensity normal. No scalp soft tissue abnormality.

Sinuses/Orbits: Globes and orbital soft tissues within normal
limits. Scattered mucosal thickening throughout the paranasal
sinuses, greatest within the left sphenoid and right maxillary
sinus. Superimposed retention cyst present at the right maxillary
sinus. No air-fluid level to suggest active sinusitis. No mastoid
effusion. Inner ear structures normal.
IMPRESSION: 1. No acute intracranial infarct or other process identified.
2. Mild T2/FLAIR hyperintense foci involving the supratentorial
cerebral white matter, nonspecific, but most likely related to mild
chronic small vessel ischemic disease.
3. Moderate allergic/inflammatory paranasal sinus disease.  The

## 2018-05-06 IMAGING — CT CT HEAD CODE STROKE
3 series · 15 of 47 positions shown, 18 images · non-contrast
Comparison: CT head without contrast 06/11/2014

CLINICAL DATA: Code stroke. Acute onset of left hand tingling and
numbness. Last seen normal 4 hours ago.

EXAM:
CT HEAD WITHOUT CONTRAST
TECHNIQUE: Contiguous axial images were obtained from the base of the skull
through the vertex without intravenous contrast.

[Series 3: head 5.0 st · axial · 0.41mm/px · z∈[-120,+5]mm · 9 of 31 slices shown, 12 images]
[im 3/31  brain]
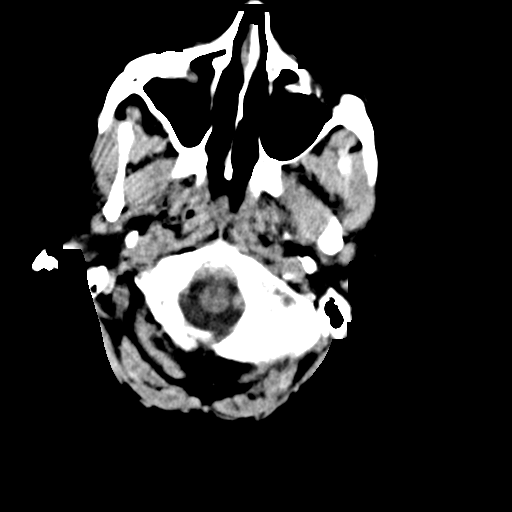
[im 3/31  bone]
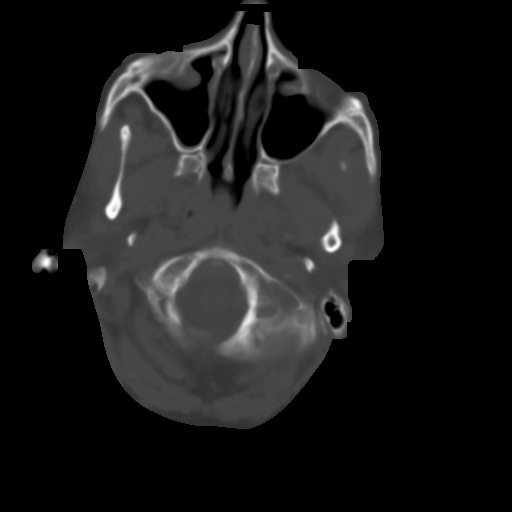
[im 6/31  brain]
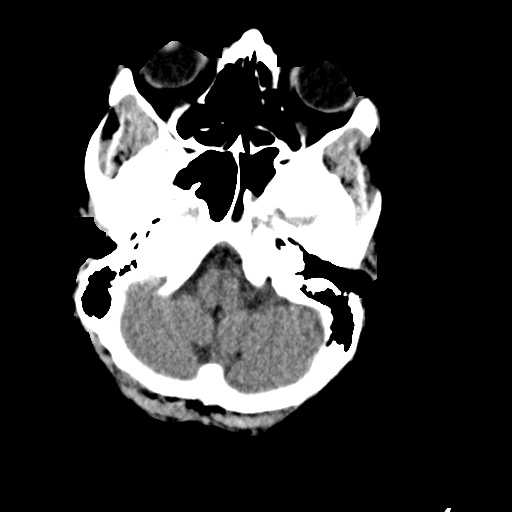
[im 9/31  brain]
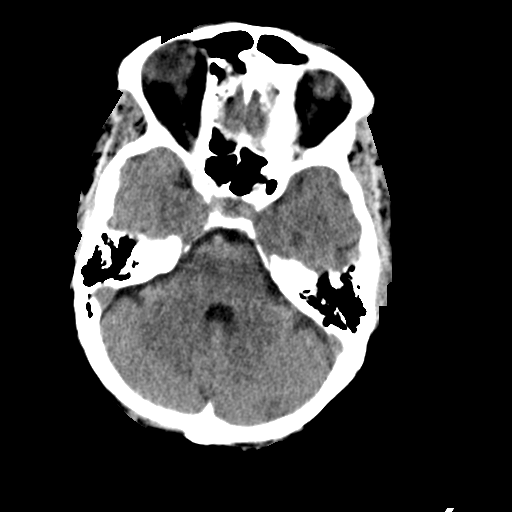
[im 12/31  brain]
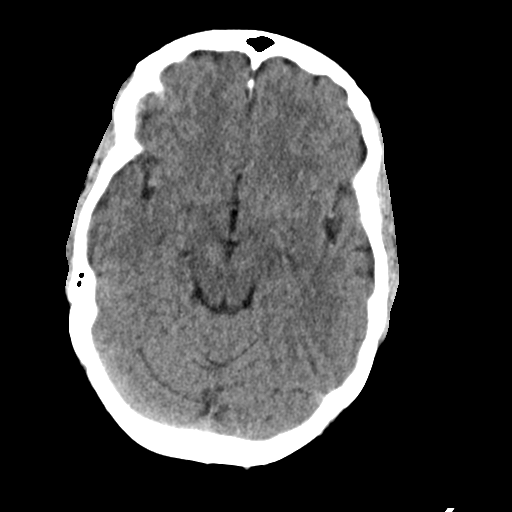
[im 16/31  brain]
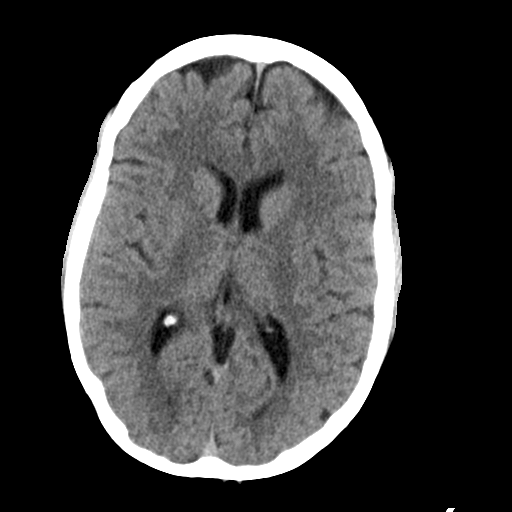
[im 16/31  bone]
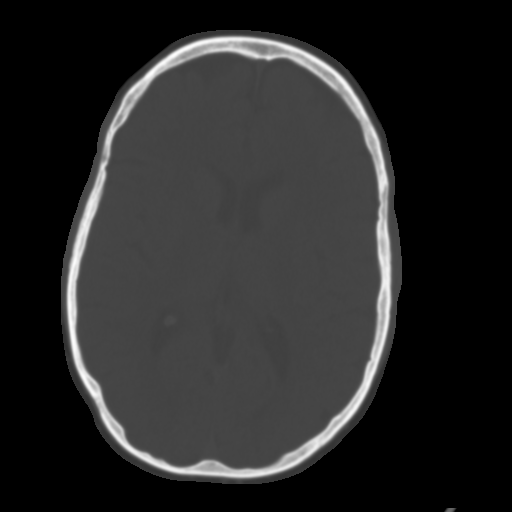
[im 19/31  brain]
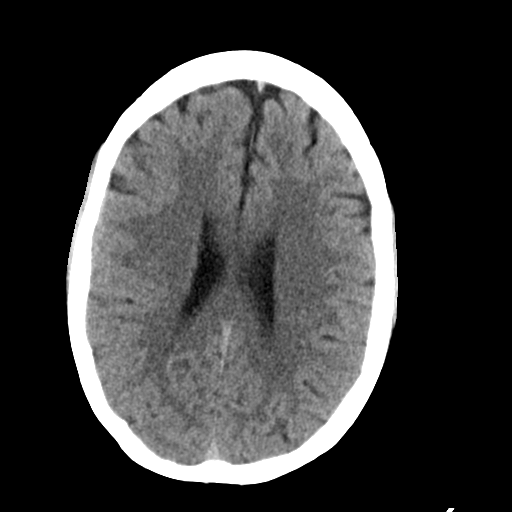
[im 22/31  brain]
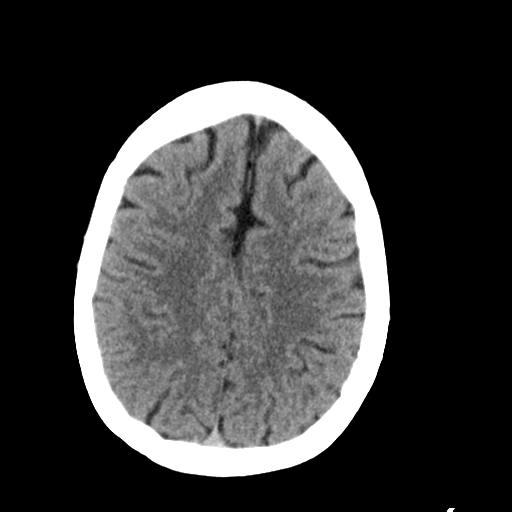
[im 25/31  brain]
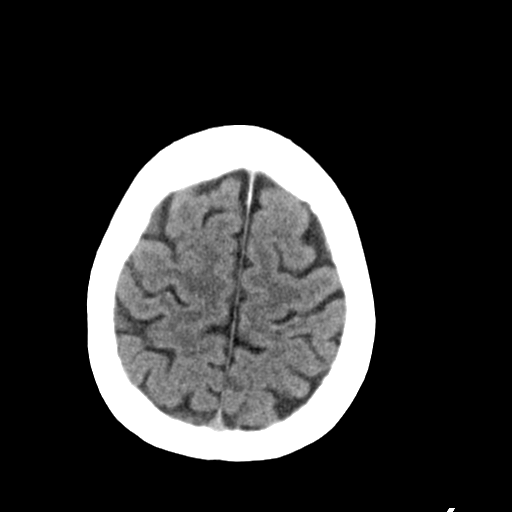
[im 28/31  brain]
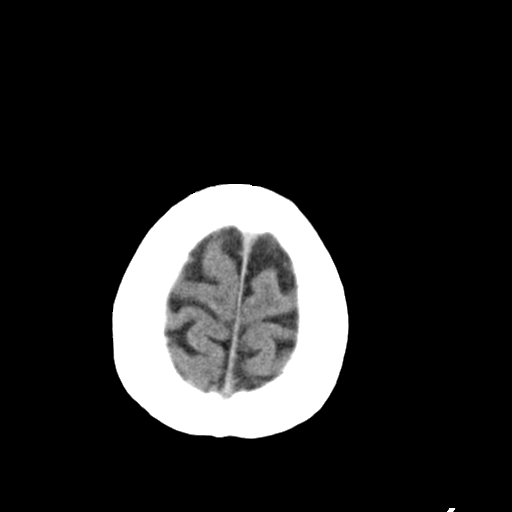
[im 28/31  bone]
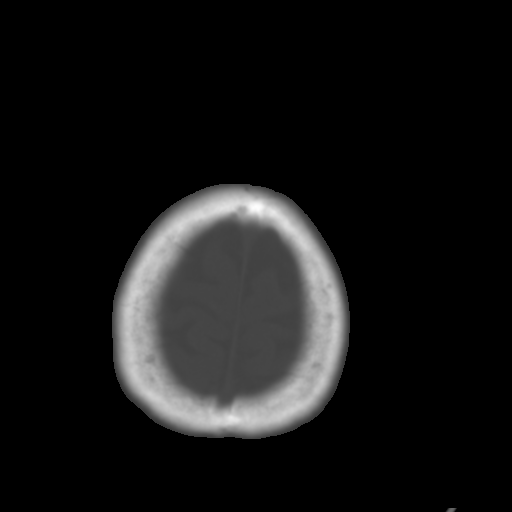

[Series 5: head 3.0 cor st · coronal · 0.30mm/px · 3 of 67 slices shown]
[im 23/67  brain]
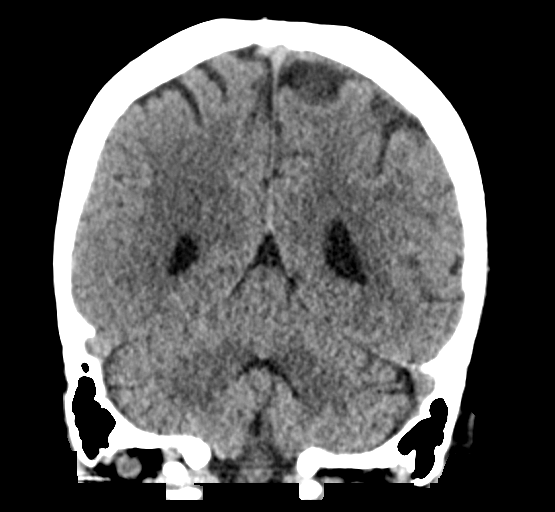
[im 30/67  brain]
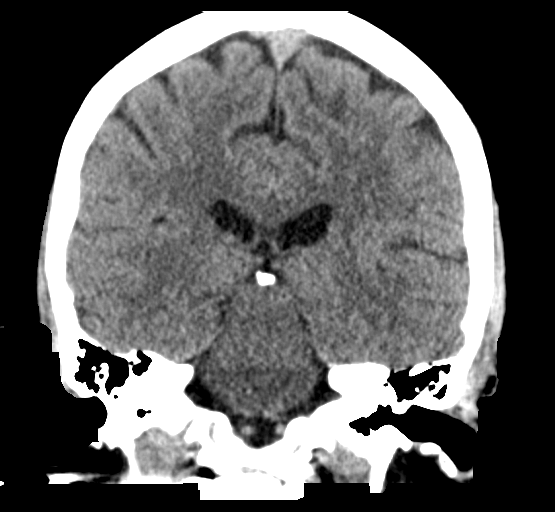
[im 37/67  brain]
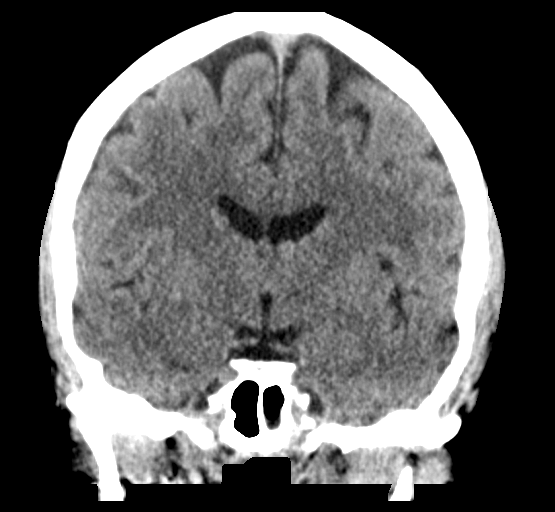

[Series 6: head 3.0 sag st · sagittal · 0.31mm/px · 3 of 51 slices shown]
[im 17/51  brain]
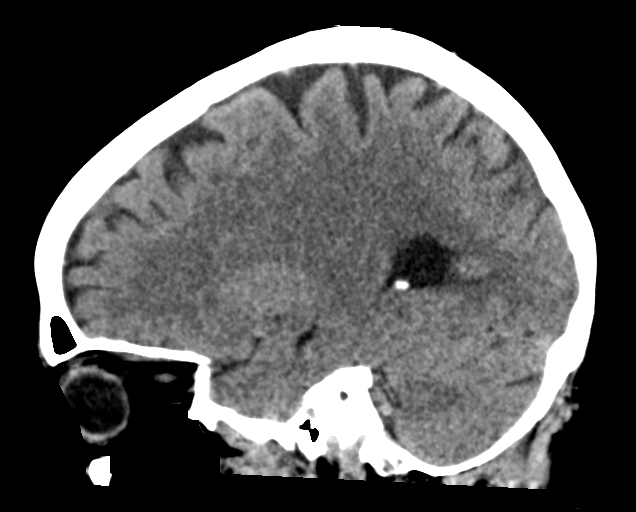
[im 26/51  brain]
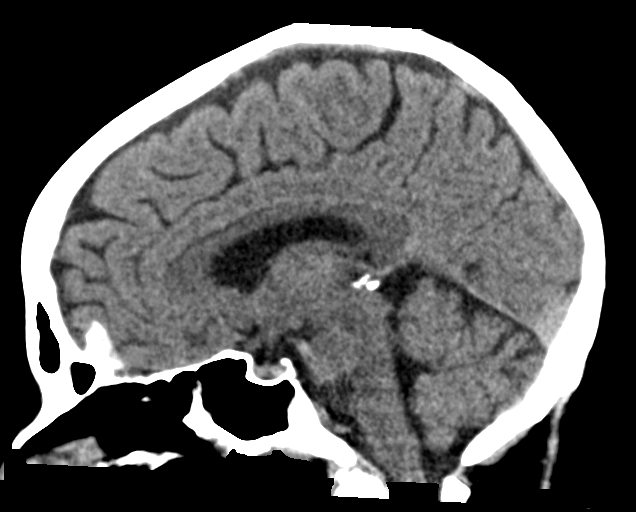
[im 34/51  brain]
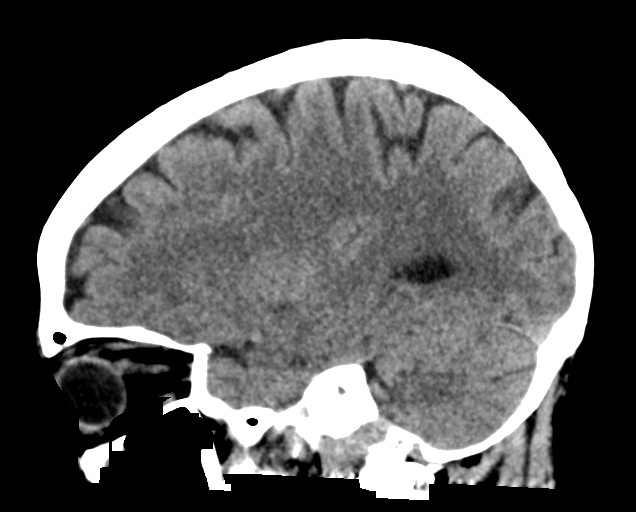

[15 of 47 positions shown; findings below may reference images not displayed]

FINDINGS: Brain: No acute infarct, hemorrhage, or mass lesion is present. The
ventricles are of normal size. No significant extraaxial fluid
collection is present.

Vascular: No hyperdense vessel or unexpected calcification.

Skull: The calvarium is intact. No focal lytic or blastic lesions
are present.

Sinuses/Orbits: Right maxillary mucosal disease appears chronic.
There is mucosal thickening in the anterior ethmoid air cells and
inferior left frontal sinus. Mild mucosal thickening is present in
the left sphenoid sinus.

ASPECTS (Alberta Stroke Program Early CT Score)

- Ganglionic level infarction (caudate, lentiform nuclei, internal
capsule, insula, M1-M3 cortex): [DATE]

- Supraganglionic infarction (M4-M6 cortex): [DATE]

Total score (0-10 with 10 being normal): [DATE]
IMPRESSION: 1. Normal CT appearance of the brain.
2. Mild diffuse sinus disease.
3. ASPECTS is [DATE]
These results were text paged at the time of interpretation on
09/22/2016 at [DATE] to Dr. Locklear .

## 2018-05-08 ENCOUNTER — Ambulatory Visit: Payer: BLUE CROSS/BLUE SHIELD | Admitting: Obstetrics & Gynecology

## 2018-05-08 ENCOUNTER — Ambulatory Visit (INDEPENDENT_AMBULATORY_CARE_PROVIDER_SITE_OTHER): Payer: BLUE CROSS/BLUE SHIELD

## 2018-05-08 DIAGNOSIS — N950 Postmenopausal bleeding: Secondary | ICD-10-CM

## 2018-05-08 DIAGNOSIS — N840 Polyp of corpus uteri: Secondary | ICD-10-CM

## 2018-05-08 DIAGNOSIS — R9389 Abnormal findings on diagnostic imaging of other specified body structures: Secondary | ICD-10-CM | POA: Diagnosis not present

## 2018-05-08 NOTE — Progress Notes (Signed)
    Rebecca Gordon [place] 10-25-1955 [id number]        63 y.o.  G2P2   RP: Postmenopausal bleeding with thickened endometrium/probable polyp for Sonohysterogram  HPI: Mild postmenopausal bleeding with feeling of bloatedness.  Last visit on May 01, 2018, pelvic ultrasound showed a mildly thick endometrial lining with a probable polyp.    OB History  Gravida Para Term Preterm AB Living  2 2       2   SAB TAB Ectopic Multiple Live Births               # Outcome Date GA Lbr Len/2nd Weight Sex Delivery Anes PTL Lv  2 Para           1 Para             Past medical history,surgical history, problem list, medications, allergies, family history and social history were all reviewed and documented in the EPIC chart.   Directed ROS with pertinent positives and negatives documented in the history of present illness/assessment and plan.  Exam:  There were no vitals filed for this visit. General appearance:  Normal                                                                    Sono Infusion Hysterogram ( procedure note)   The initial transvaginal ultrasound demonstrated the following:  Unchanged since 05/01/18: Anteverted uterus homogeneous, measuring 6.92 x 4.09 x 2.95 cm.  Thickened endometrium at 7 mm with suspected 1.7 x 7 mm endometrial mass with a feeder vessel identified anteriorly.  Both ovaries small with atrophic appearance.  No free fluid in the posterior cul-de-sac.  The speculum  was inserted and the cervix cleansed with Betadine solution after confirming that patient has no allergies.A small sonohysterography catheterwas utilized.  Insertion was facilitated with ring forceps, using a spear-like motion the catheter was inserted to the fundus of the uterus. The speculum is then removed carefully to avoid dislodging the catheter. The catheter was flushed with sterile saline delete prior to insertion to rid it of small amounts of air.the sterile saline solution was infused into the  uterine cavity as a vaginal ultrasound probe was then placed in the vagina for full visualization of the uterine cavity from a transvaginal approach. The following was noted:  Saline infusion outlines a 15 mm intracavitary mass along the anterior wall.  The catheter was then removed after retrieving some of the saline from the intrauterine cavity. An endometrial biopsy was not done. Patient tolerated procedure well. She had received a tablet of Aleve for discomfort.    Assessment/Plan:  63 y.o. G2P2   1. Postmenopausal bleeding Probably secondary to endometrial polyp.  2. Endometrial polyp 1.5 cm intrauterine lesion compatible with an endometrial polyp.  Findings reviewed with patient.  Decision to proceed with a hysteroscopy, MyoSure excision of the intrauterine lesion and D&C.  Information and pamphlet given on the hysteroscopy procedure.  Patient voiced understanding and agreement with plan.  Follow-up for preop visit.   Counseling on above issues and coordination of care more than 50% for 10 minutes.  Princess Bruins MD, 4:18 PM 05/08/2018

## 2018-05-09 ENCOUNTER — Encounter: Payer: Self-pay | Admitting: Obstetrics & Gynecology

## 2018-05-09 ENCOUNTER — Telehealth: Payer: Self-pay

## 2018-05-09 NOTE — Patient Instructions (Signed)
1. Postmenopausal bleeding Probably secondary to endometrial polyp.  2. Endometrial polyp 1.5 cm intrauterine lesion compatible with an endometrial polyp.  Findings reviewed with patient.  Decision to proceed with a hysteroscopy, MyoSure excision of the intrauterine lesion and D&C.  Information and pamphlet given on the hysteroscopy procedure.  Patient voiced understanding and agreement with plan.  Follow-up for preop visit.   Rebecca Gordon, it was a pleasure seeing you today!

## 2018-05-09 NOTE — Telephone Encounter (Signed)
I called patient regarding scheduling surgery. We discussed her ins benefits and her estimated GGA surgery prepymt amount.  We discussed dates. She is driving and wants to check with her husband about dates and call me back when she is not driving to schedule.

## 2018-05-12 NOTE — Telephone Encounter (Signed)
Patient called back stating she would like to scheduled for 05/26/18 at 7:30am and I scheduler her.  Pre op appt with Dr. Marguerita Merles scheduled for 05/20/18 at 2:00pm.

## 2018-05-16 DIAGNOSIS — Z1231 Encounter for screening mammogram for malignant neoplasm of breast: Secondary | ICD-10-CM | POA: Diagnosis not present

## 2018-05-16 LAB — HM MAMMOGRAPHY

## 2018-05-19 ENCOUNTER — Encounter: Payer: Self-pay | Admitting: Anesthesiology

## 2018-05-19 ENCOUNTER — Telehealth: Payer: Self-pay | Admitting: *Deleted

## 2018-05-19 NOTE — Telephone Encounter (Signed)
Received Mammogram results from [facility]; forwarded to provider/SLS 02/03   

## 2018-05-20 ENCOUNTER — Ambulatory Visit: Payer: BLUE CROSS/BLUE SHIELD | Admitting: Obstetrics & Gynecology

## 2018-05-20 ENCOUNTER — Encounter: Payer: Self-pay | Admitting: Obstetrics & Gynecology

## 2018-05-20 ENCOUNTER — Encounter (HOSPITAL_BASED_OUTPATIENT_CLINIC_OR_DEPARTMENT_OTHER): Payer: Self-pay

## 2018-05-20 DIAGNOSIS — N950 Postmenopausal bleeding: Secondary | ICD-10-CM

## 2018-05-20 DIAGNOSIS — N840 Polyp of corpus uteri: Secondary | ICD-10-CM

## 2018-05-20 NOTE — Progress Notes (Signed)
Rebecca Gordon December 08, 1955 [id number]        63 y.o.  G2P2L2  RP: Preop HSC Excision/D+C  HPI: PMB with IU lesion 1.5 cm c/w an endometrial polyp on Sonohysto.  No pelvic pain   OB History  Gravida Para Term Preterm AB Living  2 2       2   SAB TAB Ectopic Multiple Live Births               # Outcome Date GA Lbr Len/2nd Weight Sex Delivery Anes PTL Lv  2 Para           1 Para             Past medical history,surgical history, problem list, medications, allergies, family history and social history were all reviewed and documented in the EPIC chart.   Directed ROS with pertinent positives and negatives documented in the history of present illness/assessment and plan.  Exam:  There were no vitals filed for this visit. General appearance:  Normal  Sono Infusion Hysterogram ( procedure note)   The initial transvaginal ultrasound demonstrated the following:  Unchanged since 05/01/18:Anteverted uterus homogeneous, measuring 6.92 x 4.09 x 2.95 cm. Thickened endometrium at 7 mm with suspected 1.7 x 7 mm endometrial mass with a feeder vessel identified anteriorly. Both ovaries small with atrophic appearance. No free fluid in the posterior cul-de-sac.  The speculum  was inserted and the cervix cleansed with Betadine solution after confirming that patient has no allergies.A small sonohysterography catheterwas utilized.  Insertion was facilitated with ring forceps, using a spear-like motion the catheter was inserted to the fundus of the uterus. The speculum is then removed carefully to avoid dislodging the catheter. The catheter was flushed with sterile saline delete prior to insertion to rid it of small amounts of air.the sterile saline solution was infused into the uterine cavity as a vaginal ultrasound probe was then placed in the vagina for full visualization of the uterine cavity from a transvaginal approach. The following was noted:  Saline infusion outlines a 15 mm  intracavitary mass along the anterior wall.  The catheter was then removed after retrieving some of the saline from the intrauterine cavity. An endometrial biopsy was not done. Patient tolerated procedure well. She had received a tablet of Aleve for discomfort.    Assessment/Plan:  63 y.o. G2P2   1. Postmenopausal bleeding Probably secondary to endometrial polyp.  2. Endometrial polyp 1.5 cm intrauterine lesion compatible with an endometrial polyp.  Findings reviewed with patient.  Decision to proceed with a hysteroscopy, MyoSure excision of the intrauterine lesion and D&C.  Information and pamphlet given on the hysteroscopy procedure.  Patient voiced understanding and agreement with plan.  Procedure and risks thoroughly reviewed with patient.                        Patient was counseled as to the risk of surgery to include the following:  1. Infection (prohylactic antibiotics will be administered)  2. DVT/Pulmonary Embolism (prophylactic pneumo compression stockings will be used)  3.Trauma to internal organs requiring additional surgical procedure to repair any injury to internal organs requiring perhaps additional hospitalization days.  4.Hemmorhage requiring transfusion and blood products which carry risks such as anaphylactic reaction, hepatitis and AIDS  Patient had received literature information on the procedure scheduled and all her questions were answered and fully accepts all risk.  Counseling on above issues and coordination of care more than 50% for  15 minutes.  Princess Bruins MD, 2:06 PM 05/20/2018

## 2018-05-20 NOTE — Patient Instructions (Addendum)
1. Postmenopausal bleeding Probably secondary to endometrial polyp.  2. Endometrial polyp 1.5 cm intrauterine lesion compatible with an endometrial polyp.  Findings reviewed with patient.  Decision to proceed with a hysteroscopy, MyoSure excision of the intrauterine lesion and D&C.  Information and pamphlet given on the hysteroscopy procedure.  Patient voiced understanding and agreement with plan.  Procedure and risks thoroughly reviewed with patient.                        Patient was counseled as to the risk of surgery to include the following:  1. Infection (prohylactic antibiotics will be administered)  2. DVT/Pulmonary Embolism (prophylactic pneumo compression stockings will be used)  3.Trauma to internal organs requiring additional surgical procedure to repair any injury to internal organs requiring perhaps additional hospitalization days.  4.Hemmorhage requiring transfusion and blood products which carry risks such as anaphylactic reaction, hepatitis and AIDS  Patient had received literature information on the procedure scheduled and all her questions were answered and fully accepts all risk.  Rebecca Gordon, it was a pleasure seeing you today!

## 2018-05-21 ENCOUNTER — Other Ambulatory Visit: Payer: Self-pay

## 2018-05-21 ENCOUNTER — Encounter (HOSPITAL_BASED_OUTPATIENT_CLINIC_OR_DEPARTMENT_OTHER): Payer: Self-pay

## 2018-05-21 NOTE — Progress Notes (Signed)
Spoke with:  Scharlene Corn NPO:  After Midnight, no gum, candy, or mints   Arrival time:  0530AM Labs: CBC, BMP, EKG AM medications: Amlodipine, Metoprolol, Omeprazole Pre op orders: NO Ride home:  Mikki Santee (husband) [phone]

## 2018-05-26 ENCOUNTER — Telehealth: Payer: Self-pay | Admitting: *Deleted

## 2018-05-26 ENCOUNTER — Encounter (HOSPITAL_BASED_OUTPATIENT_CLINIC_OR_DEPARTMENT_OTHER)
Admission: RE | Disposition: A | Discharge status: Home / Self Care | Payer: Self-pay | Source: Home / Self Care | Attending: Obstetrics & Gynecology

## 2018-05-26 ENCOUNTER — Ambulatory Visit (HOSPITAL_BASED_OUTPATIENT_CLINIC_OR_DEPARTMENT_OTHER)
Admission: RE | Admit: 2018-05-26 | Discharge: 2018-05-26 | Disposition: A | Discharge status: Home / Self Care | Payer: BLUE CROSS/BLUE SHIELD | Attending: Obstetrics & Gynecology | Admitting: Obstetrics & Gynecology

## 2018-05-26 ENCOUNTER — Ambulatory Visit (HOSPITAL_BASED_OUTPATIENT_CLINIC_OR_DEPARTMENT_OTHER): Payer: BLUE CROSS/BLUE SHIELD | Admitting: Anesthesiology

## 2018-05-26 ENCOUNTER — Encounter (HOSPITAL_BASED_OUTPATIENT_CLINIC_OR_DEPARTMENT_OTHER): Payer: Self-pay | Admitting: *Deleted

## 2018-05-26 ENCOUNTER — Other Ambulatory Visit: Payer: Self-pay

## 2018-05-26 DIAGNOSIS — K219 Gastro-esophageal reflux disease without esophagitis: Secondary | ICD-10-CM | POA: Insufficient documentation

## 2018-05-26 DIAGNOSIS — E7800 Pure hypercholesterolemia, unspecified: Secondary | ICD-10-CM | POA: Insufficient documentation

## 2018-05-26 DIAGNOSIS — I729 Aneurysm of unspecified site: Secondary | ICD-10-CM | POA: Insufficient documentation

## 2018-05-26 DIAGNOSIS — N840 Polyp of corpus uteri: Secondary | ICD-10-CM | POA: Insufficient documentation

## 2018-05-26 DIAGNOSIS — Z79899 Other long term (current) drug therapy: Secondary | ICD-10-CM | POA: Insufficient documentation

## 2018-05-26 DIAGNOSIS — C541 Malignant neoplasm of endometrium: Secondary | ICD-10-CM | POA: Insufficient documentation

## 2018-05-26 DIAGNOSIS — Z808 Family history of malignant neoplasm of other organs or systems: Secondary | ICD-10-CM | POA: Diagnosis not present

## 2018-05-26 DIAGNOSIS — Z8042 Family history of malignant neoplasm of prostate: Secondary | ICD-10-CM | POA: Diagnosis not present

## 2018-05-26 DIAGNOSIS — M810 Age-related osteoporosis without current pathological fracture: Secondary | ICD-10-CM | POA: Diagnosis not present

## 2018-05-26 DIAGNOSIS — I10 Essential (primary) hypertension: Secondary | ICD-10-CM | POA: Insufficient documentation

## 2018-05-26 DIAGNOSIS — F410 Panic disorder [episodic paroxysmal anxiety] without agoraphobia: Secondary | ICD-10-CM | POA: Insufficient documentation

## 2018-05-26 DIAGNOSIS — Z888 Allergy status to other drugs, medicaments and biological substances status: Secondary | ICD-10-CM | POA: Diagnosis not present

## 2018-05-26 DIAGNOSIS — F329 Major depressive disorder, single episode, unspecified: Secondary | ICD-10-CM | POA: Diagnosis not present

## 2018-05-26 DIAGNOSIS — Z8673 Personal history of transient ischemic attack (TIA), and cerebral infarction without residual deficits: Secondary | ICD-10-CM | POA: Diagnosis not present

## 2018-05-26 DIAGNOSIS — R9389 Abnormal findings on diagnostic imaging of other specified body structures: Secondary | ICD-10-CM | POA: Diagnosis not present

## 2018-05-26 DIAGNOSIS — F17210 Nicotine dependence, cigarettes, uncomplicated: Secondary | ICD-10-CM | POA: Insufficient documentation

## 2018-05-26 DIAGNOSIS — Z8249 Family history of ischemic heart disease and other diseases of the circulatory system: Secondary | ICD-10-CM | POA: Insufficient documentation

## 2018-05-26 DIAGNOSIS — F419 Anxiety disorder, unspecified: Secondary | ICD-10-CM | POA: Insufficient documentation

## 2018-05-26 DIAGNOSIS — N950 Postmenopausal bleeding: Secondary | ICD-10-CM | POA: Insufficient documentation

## 2018-05-26 DIAGNOSIS — M1711 Unilateral primary osteoarthritis, right knee: Secondary | ICD-10-CM | POA: Insufficient documentation

## 2018-05-26 DIAGNOSIS — Z7982 Long term (current) use of aspirin: Secondary | ICD-10-CM | POA: Insufficient documentation

## 2018-05-26 HISTORY — DX: Presence of spectacles and contact lenses: Z97.3

## 2018-05-26 HISTORY — DX: Unspecified osteoarthritis, unspecified site: M19.90

## 2018-05-26 HISTORY — DX: Aneurysm of unspecified site: I72.9

## 2018-05-26 HISTORY — DX: Personal history of (healed) traumatic fracture: Z87.81

## 2018-05-26 HISTORY — DX: Anesthesia of skin: R20.0

## 2018-05-26 HISTORY — PX: DILATATION & CURETTAGE/HYSTEROSCOPY WITH MYOSURE: SHX6511

## 2018-05-26 HISTORY — DX: Transient cerebral ischemic attack, unspecified: G45.9

## 2018-05-26 HISTORY — DX: Nontoxic single thyroid nodule: E04.1

## 2018-05-26 HISTORY — DX: Anal fissure, unspecified: K60.2

## 2018-05-26 HISTORY — DX: Postmenopausal bleeding: N95.0

## 2018-05-26 HISTORY — DX: Cardiomegaly: I51.7

## 2018-05-26 HISTORY — DX: Polyp of corpus uteri: N84.0

## 2018-05-26 LAB — CBC
HCT: 42.9 % (ref 36.0–46.0)
HEMOGLOBIN: 13.8 g/dL (ref 12.0–15.0)
MCH: 30.9 pg (ref 26.0–34.0)
MCHC: 32.2 g/dL (ref 30.0–36.0)
MCV: 96.2 fL (ref 80.0–100.0)
Platelets: 244 10*3/uL (ref 150–400)
RBC: 4.46 MIL/uL (ref 3.87–5.11)
RDW: 12.6 % (ref 11.5–15.5)
WBC: 6.2 10*3/uL (ref 4.0–10.5)
nRBC: 0 % (ref 0.0–0.2)

## 2018-05-26 LAB — BASIC METABOLIC PANEL
Anion gap: 9 (ref 5–15)
BUN: 21 mg/dL (ref 8–23)
CO2: 24 mmol/L (ref 22–32)
Calcium: 8.8 mg/dL — ABNORMAL LOW (ref 8.9–10.3)
Chloride: 106 mmol/L (ref 98–111)
Creatinine, Ser: 0.5 mg/dL (ref 0.44–1.00)
GFR calc Af Amer: >60 mL/min (ref >60)
GFR calc non Af Amer: >60 mL/min (ref >60)
Glucose, Bld: 108 mg/dL — ABNORMAL HIGH (ref 70–99)
POTASSIUM: 3.6 mmol/L (ref 3.5–5.1)
Sodium: 139 mmol/L (ref 135–145)

## 2018-05-26 LAB — TYPE AND SCREEN
ABO/RH(D): A POS
Antibody Screen: NEGATIVE

## 2018-05-26 LAB — POCT PREGNANCY, URINE: Preg Test, Ur: NEGATIVE

## 2018-05-26 LAB — ABO/RH: ABO/RH(D): A POS

## 2018-05-26 SURGERY — DILATATION & CURETTAGE/HYSTEROSCOPY WITH MYOSURE
Anesthesia: General | Site: Vagina

## 2018-05-26 MED ORDER — LACTATED RINGERS IV SOLN
INTRAVENOUS | Status: DC
  Administered 2018-05-26 (×2): via INTRAVENOUS
  Filled 2018-05-26: qty 1000

## 2018-05-26 MED ORDER — ONDANSETRON HCL 4 MG/2ML IJ SOLN
INTRAMUSCULAR | Status: DC | PRN
  Administered 2018-05-26: 4 mg via INTRAVENOUS

## 2018-05-26 MED ORDER — MIDAZOLAM HCL 2 MG/2ML IJ SOLN
INTRAMUSCULAR | Status: AC
  Filled 2018-05-26: qty 2

## 2018-05-26 MED ORDER — EPHEDRINE SULFATE-NACL 50-0.9 MG/10ML-% IV SOSY
PREFILLED_SYRINGE | INTRAVENOUS | Status: DC | PRN
  Administered 2018-05-26 (×2): 10 mg via INTRAVENOUS

## 2018-05-26 MED ORDER — OXYCODONE HCL 5 MG PO TABS
5.0000 mg | ORAL_TABLET | Freq: Once | ORAL | Status: DC | PRN
  Filled 2018-05-26: qty 1

## 2018-05-26 MED ORDER — OXYCODONE HCL 5 MG/5ML PO SOLN
5.0000 mg | Freq: Once | ORAL | Status: DC | PRN
  Filled 2018-05-26: qty 5

## 2018-05-26 MED ORDER — LIDOCAINE 2% (20 MG/ML) 5 ML SYRINGE
INTRAMUSCULAR | Status: AC
  Filled 2018-05-26: qty 5

## 2018-05-26 MED ORDER — MEPERIDINE HCL 25 MG/ML IJ SOLN
6.2500 mg | INTRAMUSCULAR | Status: DC | PRN
  Filled 2018-05-26: qty 1

## 2018-05-26 MED ORDER — PROPOFOL 10 MG/ML IV BOLUS
INTRAVENOUS | Status: DC | PRN
Start: 2018-05-26 — End: 2018-05-26
  Administered 2018-05-26: 180 mg via INTRAVENOUS

## 2018-05-26 MED ORDER — LIDOCAINE HCL 1 % IJ SOLN
INTRAMUSCULAR | Status: DC | PRN
  Administered 2018-05-26: 20 mL

## 2018-05-26 MED ORDER — PROMETHAZINE HCL 25 MG/ML IJ SOLN
6.2500 mg | INTRAMUSCULAR | Status: DC | PRN
  Filled 2018-05-26: qty 1

## 2018-05-26 MED ORDER — LIDOCAINE 2% (20 MG/ML) 5 ML SYRINGE
INTRAMUSCULAR | Status: DC | PRN
  Administered 2018-05-26: 80 mg via INTRAVENOUS

## 2018-05-26 MED ORDER — MIDAZOLAM HCL 5 MG/5ML IJ SOLN
INTRAMUSCULAR | Status: DC | PRN
  Administered 2018-05-26: 2 mg via INTRAVENOUS

## 2018-05-26 MED ORDER — CEFAZOLIN SODIUM-DEXTROSE 2-4 GM/100ML-% IV SOLN
INTRAVENOUS | Status: AC
  Filled 2018-05-26: qty 100

## 2018-05-26 MED ORDER — CEFAZOLIN SODIUM-DEXTROSE 2-4 GM/100ML-% IV SOLN
2.0000 g | INTRAVENOUS | Status: AC
  Administered 2018-05-26: 2 g via INTRAVENOUS
  Filled 2018-05-26: qty 100

## 2018-05-26 MED ORDER — SODIUM CHLORIDE 0.9 % IR SOLN
Status: DC | PRN
  Administered 2018-05-26: 3000 mL

## 2018-05-26 MED ORDER — PROPOFOL 500 MG/50ML IV EMUL
INTRAVENOUS | Status: AC
  Filled 2018-05-26: qty 50

## 2018-05-26 MED ORDER — DEXAMETHASONE SODIUM PHOSPHATE 4 MG/ML IJ SOLN
INTRAMUSCULAR | Status: DC | PRN
  Administered 2018-05-26: 10 mg via INTRAVENOUS

## 2018-05-26 MED ORDER — FENTANYL CITRATE (PF) 100 MCG/2ML IJ SOLN
INTRAMUSCULAR | Status: AC
  Filled 2018-05-26: qty 2

## 2018-05-26 MED ORDER — FENTANYL CITRATE (PF) 100 MCG/2ML IJ SOLN
INTRAMUSCULAR | Status: DC | PRN
  Administered 2018-05-26: 50 ug via INTRAVENOUS

## 2018-05-26 MED ORDER — HYDROMORPHONE HCL 1 MG/ML IJ SOLN
0.2500 mg | INTRAMUSCULAR | Status: DC | PRN
  Filled 2018-05-26: qty 0.5

## 2018-05-26 SURGICAL SUPPLY — 25 items
CANISTER SUCT 3000ML PPV (MISCELLANEOUS) ×3 IMPLANT
CATH ROBINSON RED A/P 16FR (CATHETERS) ×3 IMPLANT
COVER WAND RF STERILE (DRAPES) ×3 IMPLANT
DEVICE MYOSURE LITE (MISCELLANEOUS) IMPLANT
DEVICE MYOSURE REACH (MISCELLANEOUS) ×2 IMPLANT
DILATOR CANAL MILEX (MISCELLANEOUS) IMPLANT
ELECT REM PT RETURN 9FT ADLT (ELECTROSURGICAL)
ELECTRODE REM PT RTRN 9FT ADLT (ELECTROSURGICAL) IMPLANT
GAUZE 4X4 16PLY RFD (DISPOSABLE) ×3 IMPLANT
GLOVE BIO SURGEON STRL SZ 6.5 (GLOVE) ×2 IMPLANT
GLOVE BIO SURGEONS STRL SZ 6.5 (GLOVE) ×1
GLOVE BIOGEL PI IND STRL 7.0 (GLOVE) ×2 IMPLANT
GLOVE BIOGEL PI INDICATOR 7.0 (GLOVE) ×4
GOWN STRL REUS W/TWL LRG LVL3 (GOWN DISPOSABLE) ×6 IMPLANT
IV NS IRRIG 3000ML ARTHROMATIC (IV SOLUTION) ×6 IMPLANT
KIT PROCEDURE FLUENT (KITS) ×3 IMPLANT
MYOSURE XL FIBROID (MISCELLANEOUS)
PACK BASIN DAY SURGERY FS (CUSTOM PROCEDURE TRAY) ×2 IMPLANT
PACK LAPAROSCOPY BASIN (CUSTOM PROCEDURE TRAY) IMPLANT
PACK VAGINAL MINOR WOMEN LF (CUSTOM PROCEDURE TRAY) ×3 IMPLANT
PAD OB MATERNITY 4.3X12.25 (PERSONAL CARE ITEMS) ×3 IMPLANT
PAD PREP 24X48 CUFFED NSTRL (MISCELLANEOUS) ×3 IMPLANT
SEAL ROD LENS SCOPE MYOSURE (ABLATOR) ×3 IMPLANT
SYSTEM TISS REMOVAL MYOSURE XL (MISCELLANEOUS) IMPLANT
TOWEL OR 17X26 10 PK STRL BLUE (TOWEL DISPOSABLE) ×6 IMPLANT

## 2018-05-26 NOTE — Op Note (Signed)
Operative Note  05/26/2018  8:17 AM  PATIENT:  Rebecca Gordon  63 y.o. female  PRE-OPERATIVE DIAGNOSIS:  Endometrial polyp  POST-OPERATIVE DIAGNOSIS:  Endometrial polyps, area of thickened endometrium  PROCEDURE:  Procedure(s): DILATATION & CURETTAGE/HYSTEROSCOPY WITH MYOSURE EXCISION  SURGEON:  Surgeon(s): Princess Bruins, MD  ANESTHESIA:   general  FINDINGS: Left upper lateral, anterior and posterior area of thickened endometrium/polyps, intra-uterine cavity otherwise normal.  Both ostia well seen.  DESCRIPTION OF OPERATION: Under general anesthesia with laryngeal mask the patient is in lithotomy position.  She is prepped with Betadine on the suprapubic, vulvar and vaginal areas and draped as usual.  The bladder is catheterized.  The vaginal exam reveals a retroverted uterus, normal volume mobile and no adnexal mass.  The speculum is inserted in the vagina and the anterior lip of the cervix is grasped with a tenaculum.  A paracervical block is done with lidocaine 1% a total of 20 cc at 4 and 8:00.  Hysterometry is at 8 cm.  Dilation of the cervix with Pratt dilators up to #21 without difficulty.  Insertion of the hysteroscope.  Inspection of the cavity reveals a normal right ostium.  The left ostium is not well visualized because of increased thickness of the endometrium and possibly small polyps at the left upper anterior, lateral and posterior aspect of the intrauterine cavity.  Pictures are taken.  The reach MyoSure is inserted.  The area of increased thickness of the endometrium/polyps is completely excised.  Pictures are taken after excision revealing a normal ostium on the left side and a completely normal intrauterine cavity otherwise.  The hysteroscope with the MyoSure are removed from the intrauterine cavity.  A systematic curettage of the intrauterine cavity is done with a small sharp curette.  Both specimens are sent together to pathology.  The tenaculum is removed from the  cervix.  Hemostasis is adequate.  The speculum is also removed.  The patient is brought to recovery room in good and stable status.  ESTIMATED BLOOD LOSS: 5 mL Fluid Deficit: 70 cc  Intake/Output Summary (Last 24 hours) at 05/26/2018 0817 Last data filed at 05/26/2018 0757 Gross per 24 hour  Intake -  Output 35 ml  Net -35 ml     BLOOD ADMINISTERED:none   LOCAL MEDICATIONS USED:  LIDOCAINE   SPECIMEN:  Source of Specimen:  Excision material and endometrial curettings.  Thickened endometrium/polyps?  DISPOSITION OF SPECIMEN:  PATHOLOGY  COUNTS:  YES  PLAN OF CARE: Transfer to PACU  Rebecca LavoieMD8:17 AM

## 2018-05-26 NOTE — Telephone Encounter (Signed)
Received Colonoscopy w/Imaging Results from [facility]; forwarded to provider/SLS 02/10

## 2018-05-26 NOTE — Anesthesia Postprocedure Evaluation (Signed)
Anesthesia Post Note  Patient: Rebecca Gordon  Procedure(s) Performed: DILATATION & CURETTAGE/HYSTEROSCOPY WITH MYOSURE (N/A Vagina )     Patient location during evaluation: PACU Anesthesia Type: General Level of consciousness: awake and alert Pain management: pain level controlled Vital Signs Assessment: post-procedure vital signs reviewed and stable Respiratory status: spontaneous breathing, nonlabored ventilation and respiratory function stable Cardiovascular status: blood pressure returned to baseline and stable Postop Assessment: no apparent nausea or vomiting Anesthetic complications: no    Last Vitals:  Vitals:   05/26/18 0830 05/26/18 0845  BP: 119/63 115/67  Pulse: 75 76  Resp: 16 (!) 21  Temp:    SpO2: 98% 95%    Last Pain:  Vitals:   05/26/18 0845  TempSrc:   PainSc: 0-No pain                 Lynda Rainwater

## 2018-05-26 NOTE — Discharge Instructions (Signed)
Hysteroscopy, Care After This sheet gives you information about how to care for yourself after your procedure. Your health care provider may also give you more specific instructions. If you have problems or questions, contact your health care provider. What can I expect after the procedure? After the procedure, it is common to have:  Cramping.  Bleeding. This can vary from light spotting to menstrual-like bleeding. Follow these instructions at home: Activity  Rest for 1-2 days after the procedure.  Do not douche, use tampons, or have sex for 2 weeks after the procedure, or until your health care provider approves.  Do not drive for 24 hours after the procedure, or for as long as told by your health care provider.  Do not drive, use heavy machinery, or drink alcohol while taking prescription pain medicines. Medicines   Take over-the-counter and prescription medicines only as told by your health care provider.  Do not take aspirin during recovery. It can increase the risk of bleeding. General instructions  Do not take baths, swim, or use a hot tub until your health care provider approves. Take showers instead of baths for 2 weeks, or for as long as told by your health care provider.  To prevent or treat constipation while you are taking prescription pain medicine, your health care provider may recommend that you: ? Drink enough fluid to keep your urine clear or pale yellow. ? Take over-the-counter or prescription medicines. ? Eat foods that are high in fiber, such as fresh fruits and vegetables, whole grains, and beans. ? Limit foods that are high in fat and processed sugars, such as fried and sweet foods.  Keep all follow-up visits as told by your health care provider. This is important. Contact a health care provider if:  You feel dizzy or lightheaded.  You feel nauseous.  You have abnormal vaginal discharge.  You have a rash.  You have pain that does not get better with  medicine.  You have chills. Get help right away if:  You have bleeding that is heavier than a normal menstrual period.  You have a fever.  You have pain or cramps that get worse.  You develop new abdominal pain.  You faint.  You have pain in your shoulders.  You have shortness of breath. Summary  After the procedure, you may have cramping and some vaginal bleeding.  Do not douche, use tampons, or have sex for 2 weeks after the procedure, or until your health care provider approves.  Do not take baths, swim, or use a hot tub until your health care provider approves. Take showers instead of baths for 2 weeks, or for as long as told by your health care provider.  Report any unusual symptoms to your health care provider.  Keep all follow-up visits as told by your health care provider. This is important. This information is not intended to replace advice given to you by your health care provider. Make sure you discuss any questions you have with your health care provider. Document Released: 01/21/2013 Document Revised: 05/01/2016 Document Reviewed: 05/01/2016 Elsevier Interactive Patient Education  2019 [place] [facility] Home Care Instructions  Activity: Get plenty of rest for the remainder of the day. A responsible adult should stay with you for 24 hours following the procedure.  For the next 24 hours, DO NOT: -Drive a car -Paediatric nurse -Drink alcoholic beverages -Take any medication unless instructed by your physician -Make any legal decisions or sign important papers.  Meals: Start  with liquid foods such as gelatin or soup. Progress to regular foods as tolerated. Avoid greasy, spicy, heavy foods. If nausea and/or vomiting occur, drink only clear liquids until the nausea and/or vomiting subsides. Call your physician if vomiting continues.  Special Instructions/Symptoms: Your throat may feel dry or sore from the anesthesia or the breathing tube placed in  your throat during surgery. If this causes discomfort, gargle with warm salt water. The discomfort should disappear within 24 hours.  If you had a scopolamine patch placed behind your ear for the management of post- operative nausea and/or vomiting:  1. The medication in the patch is effective for 72 hours, after which it should be removed.  Wrap patch in a tissue and discard in the trash. Wash hands thoroughly with soap and water. 2. You may remove the patch earlier than 72 hours if you experience unpleasant side effects which may include dry mouth, dizziness or visual disturbances. 3. Avoid touching the patch. Wash your hands with soap and water after contact with the patch.

## 2018-05-26 NOTE — Transfer of Care (Signed)
Immediate Anesthesia Transfer of Care Note  Patient: Rebecca Gordon  Procedure(s) Performed: DILATATION & CURETTAGE/HYSTEROSCOPY WITH MYOSURE (N/A Vagina )  Patient Location: PACU  Anesthesia Type:General  Level of Consciousness: awake  Airway & Oxygen Therapy: Patient Spontanous Breathing and Patient connected to nasal cannula oxygen  Post-op Assessment: Report given to RN and Post -op Vital signs reviewed and stable  Post vital signs: Reviewed and stable  Last Vitals:  Vitals Value Taken Time  BP    Temp    Pulse    Resp    SpO2      Last Pain:  Vitals:   05/26/18 0552  TempSrc: Oral  PainSc: 0-No pain      Patients Stated Pain Goal: 4 (38/75/64 3329)  Complications: No apparent anesthesia complications

## 2018-05-26 NOTE — Anesthesia Preprocedure Evaluation (Signed)
Anesthesia Evaluation  Patient identified by MRN, date of birth, ID band Patient awake    Reviewed: Allergy & Precautions, NPO status , Patient's Chart, lab work & pertinent test results, reviewed documented beta blocker date and time   Airway Mallampati: I  TM Distance: >3 FB Neck ROM: Full    Dental  (+) Teeth Intact, Dental Advisory Given   Pulmonary Current Smoker, former smoker,    breath sounds clear to auscultation       Cardiovascular hypertension, Pt. on medications and Pt. on home beta blockers  Rhythm:Regular Rate:Normal     Neuro/Psych Anxiety Depression negative neurological ROS     GI/Hepatic Neg liver ROS, GERD  ,  Endo/Other  negative endocrine ROS  Renal/GU negative Renal ROS     Musculoskeletal  (+) Arthritis ,   Abdominal   Peds  Hematology negative hematology ROS (+)   Anesthesia Other Findings   Reproductive/Obstetrics                             Lab Results  Component Value Date   CREATININE 0.62 03/24/2018   BUN 22 03/24/2018   NA 140 03/24/2018   K 4.0 03/24/2018   CL 104 03/24/2018   CO2 28 03/24/2018   Lab Results  Component Value Date   WBC 6.2 05/26/2018   HGB 13.8 05/26/2018   HCT 42.9 05/26/2018   MCV 96.2 05/26/2018   PLT 244 05/26/2018    Anesthesia Physical  Anesthesia Plan  ASA: II  Anesthesia Plan: General   Post-op Pain Management:    Induction: Intravenous  PONV Risk Score and Plan: 2 and Ondansetron and Midazolam  Airway Management Planned: LMA  Additional Equipment:   Intra-op Plan:   Post-operative Plan: Extubation in OR  Informed Consent: I have reviewed the patients History and Physical, chart, labs and discussed the procedure including the risks, benefits and alternatives for the proposed anesthesia with the patient or authorized representative who has indicated his/her understanding and acceptance.     Dental  advisory given  Plan Discussed with: CRNA  Anesthesia Plan Comments:         Anesthesia Quick Evaluation

## 2018-05-26 NOTE — Anesthesia Procedure Notes (Signed)
Procedure Name: LMA Insertion Date/Time: 05/26/2018 7:43 AM Performed by: Lieutenant Diego, CRNA Pre-anesthesia Checklist: Patient identified, Emergency Drugs available, Suction available and Patient being monitored Patient Re-evaluated:Patient Re-evaluated prior to induction Oxygen Delivery Method: Circle system utilized Preoxygenation: Pre-oxygenation with 100% oxygen Induction Type: IV induction Ventilation: Mask ventilation without difficulty LMA: LMA inserted LMA Size: 4.0 Number of attempts: 1 Placement Confirmation: positive ETCO2 and breath sounds checked- equal and bilateral Tube secured with: Tape Dental Injury: Teeth and Oropharynx as per pre-operative assessment

## 2018-05-26 NOTE — H&P (Signed)
Rebecca Gordon is an 63 y.o. female.  G2P2L2  RP: Endometrial Polyp for [place] Excision/D+C  HPI: PMB with IU lesion 1.5 cm c/w an endometrial polyp on Sonohysto.  No pelvic pain.  Pertinent Gynecological History: Menses: post-menopausal Contraception: post menopausal status Blood transfusions: none Sexually transmitted diseases: no past history Last mammogram: normal  Last pap: normal  OB History: G2, P2   Menstrual History: No LMP recorded. Patient is postmenopausal.    Past Medical History:  Diagnosis Date  . Anal fissure   . 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 Anxiety   . Depression   . Endometrial polyp   . Former smoker 09/23/2016  . GERD (gastroesophageal reflux disease)    Nexium  . H/O clavicle fracture 2017   Right  . High cholesterol   . History of bronchitis   . History of pneumonia   . Hypertension   . LVH (left ventricular hypertrophy) 09/23/2016   Mild, noted on ECHO  . Numbness    left fingers after TIA  . OA (osteoarthritis)    "right knee"  . Osteoporosis 08/2017   T score -2.7  . Panic attacks   . Postmenopausal bleeding   . Thyroid nodule 2019   Multiple  . TIA (transient ischemic attack)    09/2016  . Wears contact lenses     Past Surgical History:  Procedure Laterality Date  . COLONOSCOPY  2017   multiples  . EYE SURGERY Right    "growth on eye removed"  . FOOT SURGERY Right 08/2017   benign mass  . ORIF CLAVICULAR FRACTURE Right 04/28/2015   Procedure: REVISION ORIF RIGHT CLAVICAL FRACTURE, ALLOGRAFT BONE GRAFTING;  Surgeon: Justice Britain, MD;  Location: [facility];  Service: Orthopedics;  Laterality: Right;  . PLACEMENT OF BREAST IMPLANTS Bilateral   . TONSILLECTOMY      Family History  Problem Relation Age of Onset  . Heart failure Mother   . Hypertension Father   . Brain cancer Father   . Prostate cancer Father   . Skin cancer Father     Social History:  reports that she has been  smoking cigarettes. She has a 10.00 pack-year smoking history. She has never used smokeless tobacco. She reports that she does not drink alcohol or use drugs.  Allergies:  Allergies  Allergen Reactions  . Baclofen Diarrhea  . Neurontin [Gabapentin] Other (See Comments)    Somnolence    Medications Prior to Admission  Medication Sig Dispense Refill Last Dose  . amLODipine (NORVASC) 10 MG tablet TAKE 1 TABLET BY MOUTH EVERY DAY (Patient taking differently: every morning. ) 90 tablet 3 05/26/2018 at 0520  . aspirin 325 MG tablet Take 1 tablet (325 mg total) by mouth daily.   Past Week at Unknown time  . b complex vitamins tablet Take 1 tablet by mouth daily.   Past Week at Unknown time  . cholecalciferol (VITAMIN D) 1000 units tablet Take 1,000 Units by mouth every morning.    Past Week at Unknown time  . desvenlafaxine (PRISTIQ) 50 MG 24 hr tablet Take 1 tablet (50 mg total) by mouth daily. (Patient taking differently: Take 50 mg by mouth every morning. ) 90 tablet 3 05/26/2018 at 0520  . diltiazem 2 % GEL Apply 1 application topically 2 (two) times daily.   05/25/2018 at Unknown time  . fluticasone (FLONASE) 50 MCG/ACT nasal spray SPRAY 2 SPRAYS INTO EACH NOSTRIL EVERY DAY (Patient  taking differently: as needed for allergies. ) 48 g 0 Past Month at Unknown time  . hydrochlorothiazide (MICROZIDE) 12.5 MG capsule TAKE 1 CAPSULE BY MOUTH EVERY DAY (Patient taking differently: every morning. ) 90 capsule 3 05/25/2018 at Unknown time  . lidocaine (XYLOCAINE) 5 % ointment Apply 1 application topically as needed.   05/25/2018 at Unknown time  . losartan (COZAAR) 100 MG tablet TAKE 1 TABLET BY MOUTH EVERY DAY (Patient taking differently: every morning. ) 90 tablet 3 05/26/2018 at 0520  . metoprolol succinate (TOPROL-XL) 100 MG 24 hr tablet TAKE 1 TABLET BY MOUTH DAILY. TAKE WITH OR IMMEDIATELY FOLLOWING A MEAL. (Patient taking differently: every morning. ) 90 tablet 1 05/26/2018 at 0520  . Omega 3 1200 MG CAPS  Take 1,200 mg by mouth every evening.    Past Week at Unknown time  . omeprazole (PRILOSEC) 20 MG capsule Take 20 mg by mouth every morning.    05/26/2018 at 0520  . rosuvastatin (CRESTOR) 40 MG tablet Take 1 tablet (40 mg total) by mouth daily. (Patient taking differently: Take 40 mg by mouth every evening. ) 90 tablet 1 05/25/2018 at Unknown time  . topiramate (TOPAMAX) 100 MG tablet Take 1 tablet (100 mg total) by mouth daily. TAKE 1 TABLET BY MOUTH EVERYDAY AT BEDTIME 90 tablet 1 05/25/2018 at Unknown time  . traZODone (DESYREL) 50 MG tablet at bedtime as needed.   1 Past Month at Unknown time  . TURMERIC PO Take 1 capsule by mouth 2 (two) times daily.   Past Week at Unknown time  . alendronate (FOSAMAX) 70 MG tablet Take 1 tablet (70 mg total) by mouth every 7 (seven) days. Take with a full glass of water on an empty stomach. 12 tablet 4 never    REVIEW OF SYSTEMS: A ROS was performed and pertinent positives and negatives are included in the history.  GENERAL: No fevers or chills. HEENT: No change in vision, no earache, sore throat or sinus congestion. NECK: No pain or stiffness. CARDIOVASCULAR: No chest pain or pressure. No palpitations. PULMONARY: No shortness of breath, cough or wheeze. GASTROINTESTINAL: No abdominal pain, nausea, vomiting or diarrhea, melena or bright red blood per rectum. GENITOURINARY: No urinary frequency, urgency, hesitancy or dysuria. MUSCULOSKELETAL: No joint or muscle pain, no back pain, no recent trauma. DERMATOLOGIC: No rash, no itching, no lesions. ENDOCRINE: No polyuria, polydipsia, no heat or cold intolerance. No recent change in weight. HEMATOLOGICAL: No anemia or easy bruising or bleeding. NEUROLOGIC: No headache, seizures, numbness, tingling or weakness. PSYCHIATRIC: No depression, no loss of interest in normal activity or change in sleep pattern.     Blood pressure (!) 97/48, pulse 66, temperature 98.5 F (36.9 C), temperature source Oral, resp. rate 16, height 5'  3.5" (1.613 m), weight 61.7 kg, SpO2 96 %.  Physical Exam:  See office notes   Results for orders placed or performed during the hospital encounter of 05/26/18 (from the past 24 hour(s))  CBC     Status: None   Collection Time: 05/26/18  6:50 AM  Result Value Ref Range   WBC 6.2 4.0 - 10.5 K/uL   RBC 4.46 3.87 - 5.11 MIL/uL   Hemoglobin 13.8 12.0 - 15.0 g/dL   HCT 42.9 36.0 - 46.0 %   MCV 96.2 80.0 - 100.0 fL   MCH 30.9 26.0 - 34.0 pg   MCHC 32.2 30.0 - 36.0 g/dL   RDW 12.6 11.5 - 15.5 %   Platelets 244 150 - 400  K/uL   nRBC 0.0 0.0 - 0.2 %   Sono Infusion Hysterogram ( procedure note)   The initial transvaginal ultrasound demonstrated the following:  Unchanged since 05/01/18:Anteverted uterus homogeneous, measuring 6.92 x 4.09 x 2.95 cm. Thickened endometrium at 7 mm with suspected 1.7 x 7 mm endometrial mass with a feeder vessel identified anteriorly. Both ovaries small with atrophic appearance. No free fluid in the posterior cul-de-sac.  The speculum was inserted and the cervix cleansed with Betadine solution after confirming that patient has no allergies.A small sonohysterography catheterwas utilized. Insertion was facilitated with ring forceps, using a spear-like motion the catheter was inserted to the fundus of the uterus. The speculum is then removed carefully to avoid dislodging the catheter. The catheter was flushed with sterile saline delete prior to insertion to rid it of small amounts of air.the sterile saline solution was infused into the uterine cavity as a vaginal ultrasound probe was then placed in the vagina for full visualization of the uterine cavity from a transvaginal approach. The following was noted:  Saline infusion outlines a 15 mm intracavitary mass along the anterior wall.  The catheter was then removed after retrieving some of the saline from the intrauterine cavity. An endometrial biopsywas notdone. Patient tolerated procedure well. She had  received a tablet of Aleve for discomfort.    Assessment/Plan:62 y.o.G2P2   1. Postmenopausal bleeding Probably secondary to endometrial polyp.  2. Endometrial polyp 1.5 cm intrauterine lesion compatible with an endometrial polyp. Findings reviewed with patient. Decision to proceed with a hysteroscopy, MyoSure excision of the intrauterine lesion and D&C. Information and pamphlet given on the hysteroscopy procedure.Patient voiced understanding and agreement with plan. Procedure and risks thoroughly reviewed with patient.                        Patient was counseled as to the risk of surgery to include the following:  1. Infection (prohylactic antibiotics will be administered)  2. DVT/Pulmonary Embolism (prophylactic pneumo compression stockings will be used)  3.Trauma to internal organs requiring additional surgical procedure to repair any injury to internal organs requiring perhaps additional hospitalization days.  4.Hemmorhage requiring transfusion and blood products which carry risks such as anaphylactic reaction, hepatitis and AIDS  Patient had received literature information on the procedure scheduled and all her questions were answered and fully accepts all risk.   Marie-Lyne Ellenore Roscoe 05/26/2018, 7:25 AM

## 2018-05-27 ENCOUNTER — Ambulatory Visit (INDEPENDENT_AMBULATORY_CARE_PROVIDER_SITE_OTHER): Payer: BLUE CROSS/BLUE SHIELD | Admitting: Obstetrics & Gynecology

## 2018-05-27 ENCOUNTER — Encounter (HOSPITAL_BASED_OUTPATIENT_CLINIC_OR_DEPARTMENT_OTHER): Payer: Self-pay | Admitting: Obstetrics & Gynecology

## 2018-05-27 VITALS — BP 136/78

## 2018-05-27 DIAGNOSIS — C541 Malignant neoplasm of endometrium: Secondary | ICD-10-CM | POA: Diagnosis not present

## 2018-05-27 NOTE — Progress Notes (Signed)
    Rebecca Gordon [place] 06/21/1955 [id number]        63 y.o.  G2P2L2 Married.  Accompanied by husband.  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  Healing well from surgery.  No pelvic pain.  No vaginal bleeding.  No fever.   OB History  Gravida Para Term Preterm AB Living  2 2       2   SAB TAB Ectopic Multiple Live Births               # Outcome Date GA Lbr Len/2nd Weight Sex Delivery Anes PTL Lv  2 Para           1 Para             Past medical history,surgical history, problem list, medications, allergies, family history and social history were all reviewed and documented in the EPIC chart.   Directed ROS with pertinent positives and negatives documented in the history of present illness/assessment and plan.  Exam:  Vitals:   05/27/18 1528  BP: 136/78   General appearance:  Normal  Patho Endometrium 05/26/2018:  Diagnosis Endometrium, curettage - ENDOMETRIOID ADENOCARCINOMA. - SEE COMMENT. Microscopic Comment The adenocarcinoma appears FIGO grade 1.   Assessment/Plan:  63 y.o. G2P2   1. Adenocarcinoma of endometrium [facility]) Patient informed of the pathology results on the excision material as well as curettage material from the endometrium.  Patient explained that an endometrioid adenocarcinoma of the endometrium was diagnosed.  Also given the information that it appears well differentiated, grade 1.  Patient informed that the next step is to consult with a specialist in female cancer, a Gyneco-Oncologist.  Informed that the referral process has already been started and that she will receive a phone call from my office to organize this appointment as soon as possible.  Patient understands that she will need a hysterectomy including removal of her tubes and cervix as well as both ovaries.  Informed that the need of additional  treatment, adjuvant therapy, will be decided per pathology results after the hysterectomy.  Patient reassured that this is likely to be an early diagnosis and that her prognosis is likely to be good.  Patient's questions, as well as her husband's questions, answered.  Patient voiced understanding and agreement with plan.  Counseling on above issues and coordination of care more than 50% for 25 minutes.  Princess Bruins MD, 3:35 PM 05/27/2018    +

## 2018-05-28 ENCOUNTER — Telehealth: Payer: Self-pay | Admitting: *Deleted

## 2018-05-28 ENCOUNTER — Telehealth: Payer: Self-pay

## 2018-05-28 NOTE — Telephone Encounter (Signed)
Per Dr. Dellis Filbert schedule new patient appointment with Gyn- oncology due to "ENDOMETRIOID ADENOCARCINOMA."  Patient scheduled on 06/05/18 @ 12:00pm with Dr. Everitt Amber, patient informed.

## 2018-05-28 NOTE — Telephone Encounter (Signed)
Spoke with patient and informed her. °

## 2018-05-28 NOTE — Telephone Encounter (Signed)
D&C, Hysteroscopy on Monday. Patient is inquiring [facility] and is her [facility] restricted?  She usually does light weights and machines.  Anxious to get back to her regular life.

## 2018-05-28 NOTE — Telephone Encounter (Signed)
No restriction in physical activity.

## 2018-05-30 ENCOUNTER — Encounter: Payer: Self-pay | Admitting: Obstetrics & Gynecology

## 2018-05-30 NOTE — Patient Instructions (Signed)
1. Adenocarcinoma of endometrium [facility]) Patient informed of the pathology results on the excision material as well as curettage material from the endometrium.  Patient explained that an endometrioid adenocarcinoma of the endometrium was diagnosed.  Also given the information that it appears well differentiated, grade 1.  Patient informed that the next step is to consult with a specialist in female cancer, a Gyneco-Oncologist.  Informed that the referral process has already been started and that she will receive a phone call from my office to organize this appointment as soon as possible.  Patient understands that she will need a hysterectomy including removal of her tubes and cervix as well as both ovaries.  Informed that the need of additional treatment, adjuvant therapy, will be decided per pathology results after the hysterectomy.  Patient reassured that this is likely to be an early diagnosis and that her prognosis is likely to be good.  Patient's questions, as well as her husband's questions, answered.  Patient voiced understanding and agreement with plan.  Rebecca Gordon, it was a pleasure to see you and your husband today.  Please feel free to call me if you have any further questions.  I will be there for you if you need my care at any point along your treatment and after.

## 2018-06-03 ENCOUNTER — Telehealth: Payer: Self-pay | Admitting: *Deleted

## 2018-06-03 NOTE — Telephone Encounter (Signed)
Patient scheduled with Dr. Denman George on 06/05/18 ( Wednesday) has annual exam scheduled with you on 06/13/18 asked if she needs to keep this appointment? I assume no, but wanted to confirm with you, should patient just follow up with you once she is cleared by Dr. Denman George? Please advise

## 2018-06-04 ENCOUNTER — Encounter: Payer: Self-pay | Admitting: *Deleted

## 2018-06-04 NOTE — Telephone Encounter (Signed)
Patient needs a breast exam and screening Mammo needs to be scheduled now, last one 04/2017.  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  Needs a repeat Pap now.  So recommend keeping that appointment 06/13/18 with me.  Then, yes, f/u with me once cleared from Dr Denman George.

## 2018-06-04 NOTE — Telephone Encounter (Signed)
Left detailed message on cell with the below note.

## 2018-06-05 ENCOUNTER — Other Ambulatory Visit: Payer: Self-pay

## 2018-06-05 ENCOUNTER — Inpatient Hospital Stay: Payer: BLUE CROSS/BLUE SHIELD | Attending: Gynecologic Oncology | Admitting: Gynecologic Oncology

## 2018-06-05 ENCOUNTER — Encounter: Payer: Self-pay | Admitting: Gynecologic Oncology

## 2018-06-05 VITALS — BP 116/60 | HR 72 | Temp 98.4°F | Resp 18 | Ht 63.5 in | Wt 136.0 lb

## 2018-06-05 DIAGNOSIS — F17210 Nicotine dependence, cigarettes, uncomplicated: Secondary | ICD-10-CM | POA: Diagnosis not present

## 2018-06-05 DIAGNOSIS — C541 Malignant neoplasm of endometrium: Secondary | ICD-10-CM | POA: Diagnosis not present

## 2018-06-05 DIAGNOSIS — G8918 Other acute postprocedural pain: Secondary | ICD-10-CM

## 2018-06-05 MED ORDER — MEGESTROL ACETATE 40 MG PO TABS: 40.0000 mg | ORAL_TABLET | Freq: Two times a day (BID) | ORAL | 1 refills | Status: DC

## 2018-06-05 MED ORDER — OXYCODONE HCL 5 MG PO TABS: 5.0000 mg | ORAL_TABLET | ORAL | 0 refills | Status: DC | PRN

## 2018-06-05 MED ORDER — ASPIRIN 81 MG PO CHEW: 81.0000 mg | CHEWABLE_TABLET | Freq: Every day | ORAL | Status: DC

## 2018-06-05 MED ORDER — IBUPROFEN 800 MG PO TABS: 800.0000 mg | ORAL_TABLET | Freq: Three times a day (TID) | ORAL | 0 refills | Status: DC | PRN

## 2018-06-05 MED ORDER — SENNOSIDES-DOCUSATE SODIUM 8.6-50 MG PO TABS: 2.0000 | ORAL_TABLET | Freq: Every day | ORAL | 1 refills | Status: DC

## 2018-06-05 NOTE — Progress Notes (Signed)
Consult Note: Gyn-Onc  Consult was requested by Dr. Dellis Filbert for the evaluation of Rebecca Gordon 63 y.o. female  CC:  Chief Complaint  Patient presents with  . Endometrial cancer [facility])    Assessment/Plan:  [name]  is a 63 y.o.  year old with a grade 1 endometrial cancer and a history of TIA.  A detailed discussion was held with the patient and her family with regard to to her endometrial cancer diagnosis. We discussed the standard management options for uterine cancer which includes surgery followed possibly by adjuvant therapy depending on the results of surgery. The options for surgical management include a hysterectomy and removal of the tubes and ovaries possibly with removal of pelvic and para-aortic lymph nodes.If feasible, a minimally invasive approach including a robotic hysterectomy or laparoscopic hysterectomy have benefits including shorter hospital stay, recovery time and better wound healing than with open surgery. The patient has been counseled about these surgical options and the risks of surgery in general including infection, bleeding, damage to surrounding structures (including bowel, bladder, ureters, nerves or vessels), and the postoperative risks of PE/ DVT, and lymphedema. I extensively reviewed the additional risks of robotic hysterectomy including possible need for conversion to open laparotomy.  I discussed positioning during surgery of trendelenberg and risks of minor facial swelling and care we take in preoperative positioning.  After counseling and consideration of her options, she desires to proceed with robotic assisted total hysterectomy with bilateral sapingo-oophorectomy and SLN biopsy.   She has an increased risk for stroke around the time of surgery. I discussed this with the patient. She has an increased risk for bleeding due to her ASA use and tumeric use. I have recommended that she decrease ASA to 81mg  a day preoperatively and in the week  postoperatively. I have asked her to discontinue tumeric until after I see her back for a postoperative check.   She will be seen by anesthesia for preoperative clearance and discussion of postoperative pain management.  She was given the opportunity to ask questions, which were answered to her satisfaction, and she is agreement with the above mentioned plan of care.  Her daughter's wedding is March 21st and she would prefer to delay surgery until after that time if safe. I have prescribed progesterone to take until that time.   She has pain with an anal fissure that she has had since August, 2019. This is associated with poor QOL. At her request I will have her seen by Dr Leighton Ruff.  HPI: Ms Rebecca Gordon is a 63 year old P2 who is seen in consultation at the request of Dr Dellis Filbert for grade 1 endometrial cancer.  The patient reports menopausal bleeding since November 2019.  She had posterior menopause at age 63.  She was seen by Dr. Dellis Filbert who performed a transvaginal ultrasound and a sonohysterogram on May 08, 2018.  This revealed an anteverted uterus measuring 6.9 x 4 x 2.9 cm.  The endometrium was 7 mm with a 1.7 x 7 mm endometrial mass thought to be an endometrial polyp.  She was then taken to the operating room for a D&C procedure and hysteroscopy on May 26, 2018.  Final pathology from that procedure revealed a FIGO grade 1 endometrioid adenocarcinoma.  The patient has a history of a TIA in 2018 treated at [facility] with full work-up.  No cardiac or vascular pathology was identified as part of the work-up.  She was placed on aspirin 325  mg daily following this episode.  Her only residual symptoms from her TIA are left hand numbness and tingling and some strange feelings in her left upper.  She is had no prior abdominal surgeries.  She has had 2 prior vaginal deliveries.  Her family history is significant for father with brain cancer, skin cancer, and prostate cancer.  There is a  lot of colon cancer on that side of the family.  She reports significant anal pain from an anal fissure diagnosed in August, 2019. No relief with therapies by Dr Collene Mares.   Current Meds:  Outpatient Encounter Medications as of 06/05/2018  Medication Sig  . alendronate (FOSAMAX) 70 MG tablet Take 1 tablet (70 mg total) by mouth every 7 (seven) days. Take with a full glass of water on an empty stomach.  Marland Kitchen amLODipine (NORVASC) 10 MG tablet TAKE 1 TABLET BY MOUTH EVERY DAY (Patient taking differently: every morning. )  . b complex vitamins tablet Take 1 tablet by mouth daily.  . cholecalciferol (VITAMIN D) 1000 units tablet Take 1,000 Units by mouth every morning.   . desvenlafaxine (PRISTIQ) 50 MG 24 hr tablet Take 1 tablet (50 mg total) by mouth daily. (Patient taking differently: Take 50 mg by mouth every morning. )  . diltiazem 2 % GEL Apply 1 application topically 2 (two) times daily.  . fluticasone (FLONASE) 50 MCG/ACT nasal spray SPRAY 2 SPRAYS INTO EACH NOSTRIL EVERY DAY (Patient taking differently: as needed for allergies. )  . hydrochlorothiazide (MICROZIDE) 12.5 MG capsule TAKE 1 CAPSULE BY MOUTH EVERY DAY (Patient taking differently: every morning. )  . lidocaine (XYLOCAINE) 5 % ointment Apply 1 application topically as needed.  Marland Kitchen losartan (COZAAR) 100 MG tablet TAKE 1 TABLET BY MOUTH EVERY DAY (Patient taking differently: every morning. )  . metoprolol succinate (TOPROL-XL) 100 MG 24 hr tablet TAKE 1 TABLET BY MOUTH DAILY. TAKE WITH OR IMMEDIATELY FOLLOWING A MEAL. (Patient taking differently: every morning. )  . Omega 3 1200 MG CAPS Take 1,200 mg by mouth every evening.   Marland Kitchen omeprazole (PRILOSEC) 20 MG capsule Take 20 mg by mouth every morning.   . rosuvastatin (CRESTOR) 40 MG tablet Take 1 tablet (40 mg total) by mouth daily. (Patient taking differently: Take 40 mg by mouth every evening. )  . topiramate (TOPAMAX) 100 MG tablet Take 1 tablet (100 mg total) by mouth daily. TAKE 1 TABLET BY  MOUTH EVERYDAY AT BEDTIME  . traZODone (DESYREL) 50 MG tablet at bedtime as needed.   . TURMERIC PO Take 1 capsule by mouth 2 (two) times daily.  . [DISCONTINUED] aspirin 325 MG tablet Take 1 tablet (325 mg total) by mouth daily.  . megestrol (MEGACE) 40 MG tablet Take 1 tablet (40 mg total) by mouth 2 (two) times daily.   Facility-Administered Encounter Medications as of 06/05/2018  Medication  . aspirin chewable tablet 81 mg    Allergy:  Allergies  Allergen Reactions  . Baclofen Diarrhea  . Neurontin [Gabapentin] Other (See Comments)    Somnolence    Social Hx:   Social History   Socioeconomic History  . Marital status: Married    Spouse name: Not on file  . Number of children: 2  . Years of education: College  . Highest education level: Not on file  Occupational History  . Occupation: Unemployed  Social Needs  . Financial resource strain: Not on file  . Food insecurity:    Worry: Not on file    Inability: Not on  file  . Transportation needs:    Medical: Not on file    Non-medical: Not on file  Tobacco Use  . Smoking status: Current Every Day Smoker    Packs/day: 0.25    Years: 40.00    Pack years: 10.00    Types: Cigarettes  . Smokeless tobacco: Never Used  . Tobacco comment: Previous 1 pack a day  Substance and Sexual Activity  . Alcohol use: No    Comment: daily - 1 bottle of wine--- 09/23/2016, 3-4 glasses--- 10/15/16  . Drug use: No  . Sexual activity: Never    Birth control/protection: Post-menopausal    Comment: 1st intercourse- 79, partners- 82, married- 58 yrs   Lifestyle  . Physical activity:    Days per week: Not on file    Minutes per session: Not on file  . Stress: Not on file  Relationships  . Social connections:    Talks on phone: Not on file    Gets together: Not on file    Attends religious service: Not on file    Active member of club or organization: Not on file    Attends meetings of clubs or organizations: Not on file    Relationship  status: Not on file  . Intimate partner violence:    Fear of current or ex partner: Not on file    Emotionally abused: Not on file    Physically abused: Not on file    Forced sexual activity: Not on file  Other Topics Concern  . Not on file  Social History Narrative   Lives at home w/ her husband   Right-handed   Caffeine: 2-3 cups per day    Past Surgical Hx:  Past Surgical History:  Procedure Laterality Date  . COLONOSCOPY  2017   multiples  . DILATATION & CURETTAGE/HYSTEROSCOPY WITH MYOSURE N/A 05/26/2018   Procedure: DILATATION & CURETTAGE/HYSTEROSCOPY WITH MYOSURE;  Surgeon: Princess Bruins, MD;  Location: [facility];  Service: Gynecology;  Laterality: N/A;  . EYE SURGERY Right    "growth on eye removed"  . FOOT SURGERY Right 08/2017   benign mass  . ORIF CLAVICULAR FRACTURE Right 04/28/2015   Procedure: REVISION ORIF RIGHT CLAVICAL FRACTURE, ALLOGRAFT BONE GRAFTING;  Surgeon: Justice Britain, MD;  Location: [place];  Service: Orthopedics;  Laterality: Right;  . PLACEMENT OF BREAST IMPLANTS Bilateral   . TONSILLECTOMY      Past Medical Hx:  Past Medical History:  Diagnosis Date  . Anal fissure   . 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 Anxiety   . Depression   . Endometrial polyp   . Former smoker 09/23/2016  . GERD (gastroesophageal reflux disease)    Nexium  . H/O clavicle fracture 2017   Right  . High cholesterol   . History of bronchitis   . History of pneumonia   . Hypertension   . LVH (left ventricular hypertrophy) 09/23/2016   Mild, noted on ECHO  . Numbness    left fingers after TIA  . OA (osteoarthritis)    "right knee"  . Osteoporosis 08/2017   T score -2.7  . Panic attacks   . Postmenopausal bleeding   . Thyroid nodule 2019   Multiple  . TIA (transient ischemic attack)    09/2016  . Wears contact lenses     Past Gynecological History:  See HPI No LMP recorded. Patient is  postmenopausal.  Family Hx:  Family History  Problem Relation Age of Onset  . Heart failure Mother   . Hypertension Father   . Brain cancer Father   . Prostate cancer Father   . Skin cancer Father     Review of Systems:  Constitutional  Feels well,    ENT Normal appearing ears and nares bilaterally Skin/Breast  No rash, sores, jaundice, itching, dryness Cardiovascular  No chest pain, shortness of breath, or edema  Pulmonary  No cough or wheeze.  Gastro Intestinal  No nausea, vomitting, or diarrhoea. No bright red blood per rectum, no abdominal pain, change in bowel movement, or constipation. + anal pain Genito Urinary  No frequency, urgency, dysuria, + postmenopausal bleeding Musculo Skeletal  No myalgia, arthralgia, joint swelling or pain  Neurologic  No weakness, numbness, change in gait,  Psychology  No depression, anxiety, insomnia.   Vitals:  Blood pressure 116/60, pulse 72, temperature 98.4 F (36.9 C), temperature source Oral, resp. rate 18, height 5' 3.5" (1.613 m), weight 136 lb (61.7 kg), SpO2 98 %.  Physical Exam: WD in NAD Neck  Supple NROM, without any enlargements.  Lymph Node Survey No cervical supraclavicular or inguinal adenopathy Cardiovascular  Pulse normal rate, regularity and rhythm. S1 and S2 normal.  Lungs  Clear to auscultation bilateraly, without wheezes/crackles/rhonchi. Good air movement.  Skin  No rash/lesions/breakdown  Psychiatry  Alert and oriented to person, place, and time  Abdomen  Normoactive bowel sounds, abdomen soft, non-tender and thin without evidence of hernia.  Back No CVA tenderness Genito Urinary  Vulva/vagina: Normal external female genitalia.  No lesions. No discharge or bleeding.  Bladder/urethra:  No lesions or masses, well supported bladder  Vagina: normal  Cervix: Normal appearing, no lesions.  Uterus:  Small, mobile, no parametrial involvement or nodularity.  Adnexa: no palpable masses. Rectal   deferred Extremities  No bilateral cyanosis, clubbing or edema.   Thereasa Solo, MD  06/05/2018, 12:44 PM

## 2018-06-05 NOTE — Patient Instructions (Addendum)
Preparing for your Surgery  Plan for surgery on July 08, 2018 with Dr. Everitt Amber at [place] will be scheduled for a robotic assisted total laparoscopic hysterectomy, bilateral salpingo-oophorectomy, sentinel lymph node biopsy.   Please stop taking the Tumeric.  Please stop taking the aspirin 325mg  and start taking aspirin 81mg  once a day instead.  Please take the progesterone tablet twice a day until your surgery (you will not need to take this postoperatively).   Dr Denman George will facilitate a referral to Dr Marcello Moores for your anal fissure.   Pre-operative Testing -You will receive a phone call from presurgical testing at [facility] if you have not received a call already to arrange for a pre-operative testing appointment before your surgery.  This appointment normally occurs one to two weeks before your scheduled surgery.   -Bring your insurance card, copy of an advanced directive if applicable, medication list  -At that visit, you will be asked to sign a consent for a possible blood transfusion in case a transfusion becomes necessary during surgery.  The need for a blood transfusion is rare but having consent is a necessary part of your care.     -As part of our enhanced surgical recovery pathway, you may be advised to drink a carbohydrate drink the morning of surgery (at least 3 hours before). If you are diabetic, this will be avoided in order to prevent elevated glucose levels.  Day Before Surgery at [facility] will be asked to take in a light diet the day before surgery.  Avoid carbonated beverages.  You will be advised to have nothing to eat or drink after midnight the evening before.    Eat a light diet the day before surgery.  Examples including soups, broths, toast, yogurt, mashed potatoes.  Things to avoid include carbonated beverages (fizzy beverages), raw fruits and raw vegetables, or beans.   If your bowels are filled with gas, your surgeon will have  difficulty visualizing your pelvic organs which increases your surgical risks.  Your role in recovery Your role is to become active as soon as directed by your doctor, while still giving yourself time to heal.  Rest when you feel tired. You will be asked to do the following in order to speed your recovery:  - Cough and breathe deeply. This helps toclear and expand your lungs and can prevent pneumonia. You may be given a spirometer to practice deep breathing. A staff member will show you how to use the spirometer. - Do mild physical activity. Walking or moving your legs help your circulation and body functions return to normal. A staff member will help you when you try to walk and will provide you with simple exercises. Do not try to get up or walk alone the first time. - Actively manage your pain. Managing your pain lets you move in comfort. We will ask you to rate your pain on a scale of zero to 10. It is your responsibility to tell your doctor or nurse where and how much you hurt so your pain can be treated.  Special Considerations -If you are diabetic, you may be placed on insulin after surgery to have closer control over your blood sugars to promote healing and recovery.  This does not mean that you will be discharged on insulin.  If applicable, your oral antidiabetics will be resumed when you are tolerating a solid diet.  -Your final pathology results from surgery should be available around one week after  surgery and the results will be relayed to you when available.  -Dr. Lahoma Crocker is the Surgeon that assists your GYN Oncologist with surgery.  If you end of staying the night, the next day after your surgery you will either see Dr. Denman George or Dr. Lahoma Crocker.  -FMLA forms can be faxed to [phone] and please allow 5-7 business days for completion.  Pain Management After Surgery -You have been prescribed your pain medication and bowel regimen medications before surgery so that  you can have these available when you are discharged from the hospital. The pain medication is for use ONLY AFTER surgery and a new prescription will not be given.   -Make sure that you have Tylenol and Ibuprofen at home to use on a regular basis after surgery for pain control. We recommend alternating the medications every hour to six hours since they work differently and are processed in the body differently for pain relief.  -Review the attached handout on narcotic use and their risks and side effects.   Bowel Regimen -You have been prescribed Sennakot-S to take nightly to prevent constipation especially if you are taking the narcotic pain medication intermittently.  It is important to prevent constipation and drink adequate amounts of liquids.  Blood Transfusion Information WHAT IS A BLOOD TRANSFUSION? A transfusion is the replacement of blood or some of its parts. Blood is made up of multiple cells which provide different functions.  Red blood cells carry oxygen and are used for blood loss replacement.  White blood cells fight against infection.  Platelets control bleeding.  Plasma helps clot blood.  Other blood products are available for specialized needs, such as hemophilia or other clotting disorders. BEFORE THE TRANSFUSION  Who gives blood for transfusions?   You may be able to donate blood to be used at a later date on yourself (autologous donation).  Relatives can be asked to donate blood. This is generally not any safer than if you have received blood from a stranger. The same precautions are taken to ensure safety when a relative's blood is donated.  Healthy volunteers who are fully evaluated to make sure their blood is safe. This is blood bank blood. Transfusion therapy is the safest it has ever been in the practice of medicine. Before blood is taken from a donor, a complete history is taken to make sure that person has no history of diseases nor engages in risky social  behavior (examples are intravenous drug use or sexual activity with multiple partners). The donor's travel history is screened to minimize risk of transmitting infections, such as malaria. The donated blood is tested for signs of infectious diseases, such as HIV and hepatitis. The blood is then tested to be sure it is compatible with you in order to minimize the chance of a transfusion reaction. If you or a relative donates blood, this is often done in anticipation of surgery and is not appropriate for emergency situations. It takes many days to process the donated blood. RISKS AND COMPLICATIONS Although transfusion therapy is very safe and saves many lives, the main dangers of transfusion include:   Getting an infectious disease.  Developing a transfusion reaction. This is an allergic reaction to something in the blood you were given. Every precaution is taken to prevent this. The decision to have a blood transfusion has been considered carefully by your caregiver before blood is given. Blood is not given unless the benefits outweigh the risks.  Megestrol tablets What is this medicine? MEGESTROL (  me JES trol) belongs to a class of drugs known as progestins. Megestrol tablets are used to treat advanced breast or endometrial cancer. This medicine may be used for other purposes; ask your health care provider or pharmacist if you have questions. COMMON BRAND NAME(S): Megace What should I tell my health care provider before I take this medicine? They need to know if you have any of these conditions: -adrenal gland problems -history of blood clots of the legs, lungs, or other parts of the body -diabetes -kidney disease -liver disease -stroke -an unusual or allergic reaction to megestrol, other medicines, foods, dyes, or preservatives -pregnant or trying to get pregnant -breast-feeding How should I use this medicine? Take this medicine by mouth. Follow the directions on the prescription label. Do  not take your medicine more often than directed. Take your doses at regular intervals. Do not stop taking except on the advice of your doctor or health care professional. Talk to your pediatrician regarding the use of this medicine in children. Special care may be needed. Overdosage: If you think you have taken too much of this medicine contact a poison control center or emergency room at once. NOTE: This medicine is only for you. Do not share this medicine with others. What if I miss a dose? If you miss a dose, take it as soon as you can. If it is almost time for your next dose, take only that dose. Do not take double or extra doses. What may interact with this medicine? Do not take this medicine with any of the following medications: -dofetilide This medicine may also interact with the following medications: -indinavir This list may not describe all possible interactions. Give your health care provider a list of all the medicines, herbs, non-prescription drugs, or dietary supplements you use. Also tell them if you smoke, drink alcohol, or use illegal drugs. Some items may interact with your medicine. What should I watch for while using this medicine? Visit your doctor or health care professional for regular checks on your progress. Continue taking this medicine even if you feel better. It may take 2 months of regular use before you know if this medicine is working for your condition. This medicine can cause birth defects. Do not get pregnant while taking this drug. Females with child-bearing potential will need to have a negative pregnancy test before starting this medicine. Use an effective method of birth control while you are taking this medicine. If you think that you might be pregnant talk to your doctor right away. If you have diabetes, this medicine may affect blood sugar levels. Check your blood sugar and talk to your doctor or health care professional if you notice changes. What side  effects may I notice from receiving this medicine? Side effects that you should report to your doctor or health care professional as soon as possible: Side effects that you should report to your doctor or health care professional as soon as possible: -allergic reactions like skin rash, itching or hives, swelling of the face, lips, or tongue -breathing problems -dizziness -increased blood pressure -signs and symptoms of a blood clot such as breathing problems; changes in vision; chest pain; severe, sudden headache; pain, swelling, warmth in the leg; trouble speaking; sudden numbness or weakness of the face, arm or leg -swelling of the ankles, feet, hands -unusually weak or tired -vomiting Side effects that usually do not require medical attention (report to your doctor or health care professional if they continue or are bothersome): -breakthrough  menstrual bleeding -changes in sex drive or performance -diarrhea -gas -hot flashes or flushing -increased appetite -upset stomach -weight gain This list may not describe all possible side effects. Call your doctor for medical advice about side effects. You may report side effects to FDA at 1-800-FDA-1088. Where should I keep my medicine? Keep out of the reach of children. Store at controlled room temperature between 15 and 30 degrees C (59 and 86 degrees F). Protect from heat above 40 degrees C (104 degrees F). Throw away any unused medicine after the expiration date. NOTE: This sheet is a summary. It may not cover all possible information. If you have questions about this medicine, talk to your doctor, pharmacist, or health care provider.  2019 Elsevier/Gold Standard (2017-04-10 10:07:34)

## 2018-06-06 ENCOUNTER — Encounter: Payer: Self-pay | Admitting: Family Medicine

## 2018-06-06 ENCOUNTER — Telehealth: Payer: Self-pay | Admitting: *Deleted

## 2018-06-06 NOTE — Telephone Encounter (Signed)
Faxed records to [facility] for the referral to Dr. Marcello Moores

## 2018-06-10 ENCOUNTER — Telehealth: Payer: Self-pay | Admitting: *Deleted

## 2018-06-10 DIAGNOSIS — K219 Gastro-esophageal reflux disease without esophagitis: Secondary | ICD-10-CM | POA: Diagnosis not present

## 2018-06-10 DIAGNOSIS — K6289 Other specified diseases of anus and rectum: Secondary | ICD-10-CM | POA: Diagnosis not present

## 2018-06-10 DIAGNOSIS — K601 Chronic anal fissure: Secondary | ICD-10-CM | POA: Diagnosis not present

## 2018-06-10 DIAGNOSIS — K5903 Drug induced constipation: Secondary | ICD-10-CM | POA: Diagnosis not present

## 2018-06-10 NOTE — Telephone Encounter (Signed)
Returned patient's call regarding the referral to Dr. Marcello Moores at [place]. Left a message for the patient to call us back if she hasn't heard back from [facility] office by Friday

## 2018-06-13 ENCOUNTER — Encounter: Payer: Self-pay | Admitting: Obstetrics & Gynecology

## 2018-06-13 ENCOUNTER — Ambulatory Visit: Payer: BLUE CROSS/BLUE SHIELD | Admitting: Obstetrics & Gynecology

## 2018-06-13 VITALS — BP 134/80 | Ht 64.8 in | Wt 135.6 lb

## 2018-06-13 DIAGNOSIS — C541 Malignant neoplasm of endometrium: Secondary | ICD-10-CM | POA: Diagnosis not present

## 2018-06-13 DIAGNOSIS — B977 Papillomavirus as the cause of diseases classified elsewhere: Secondary | ICD-10-CM

## 2018-06-13 DIAGNOSIS — Z780 Asymptomatic menopausal state: Secondary | ICD-10-CM | POA: Diagnosis not present

## 2018-06-13 DIAGNOSIS — N72 Inflammatory disease of cervix uteri: Secondary | ICD-10-CM

## 2018-06-13 DIAGNOSIS — Z01419 Encounter for gynecological examination (general) (routine) without abnormal findings: Secondary | ICD-10-CM

## 2018-06-13 NOTE — Patient Instructions (Signed)
1. Encounter for routine gynecological examination with Papanicolaou smear of cervix Normal gynecologic exam.  Pap test with high-risk HPV done today.  Breast exam normal.  Screening mammogram May 16, 2018 was benign.  Health labs with family physician.  Up-to-date with colonoscopy and followed by gastro for an anal fissure.  2. High risk human papilloma virus (HPV) infection of cervix Pap test with high-risk HPV done today.  3. Postmenopausal On no hormone replacement therapy.  4. Endometrial cancer ([place]) Referred to and seen by Dr. Denman George will proceed with her surgery soon.  Rebecca Gordon, it was a pleasure seeing you today!  I will inform you of your results as soon as they are available.

## 2018-06-13 NOTE — Addendum Note (Signed)
Addended by: Franchot Gallo on: 06/13/2018 09:23 AM   Modules accepted: Orders

## 2018-06-13 NOTE — Progress Notes (Signed)
Rebecca Gordon [birth year] [id number]   History:    63 y.o. G2P2L2 Married.  Daughter getting married.  RP:  Established patient presenting for annual gyn exam   HPI: Menopause on no hormone replacement therapy.  Had postmenopausal bleeding investigated recently with hysteroscopy, MyoSure excision and D&C.  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  Seen by gynecologic pathologist Dr. Everitt Amber who is scheduling patient's surgery.  No current pelvic pain.  Breast normal.  Recent screening mammogram May 16, 2018 was benign.  Last Pap test February 2019 was negative but patient had HPV high-risk positive.  HPV 16-18-45 were negative.  A repeat Pap test was planned at 6 months which was not done.  Health labs with family physician.  Past medical history,surgical history, family history and social history were all reviewed and documented in the EPIC chart.  Gynecologic History No LMP recorded. Patient is postmenopausal. Contraception: post menopausal status Last Pap: 05/2017. Results were: Negative, HPV HR pos.  HPV 16-18-45 negative Last mammogram: 05/16/2018. Results were: Benign Bone Density: 08/2017 Colonoscopy: 08/2015.  Followed by Dr Collene Mares, treating an anal fissure.  Obstetric History OB History  Gravida Para Term Preterm AB Living  2 2       2   SAB TAB Ectopic Multiple Live Births               # Outcome Date GA Lbr Len/2nd Weight Sex Delivery Anes PTL Lv  2 Para           1 Para              ROS: A ROS was performed and pertinent positives and negatives are included in the history.  GENERAL: No fevers or chills. HEENT: No change in vision, no earache, sore throat or sinus congestion. NECK: No pain or stiffness. CARDIOVASCULAR: No chest pain or pressure. No palpitations. PULMONARY: No shortness of breath, cough or wheeze. GASTROINTESTINAL: No abdominal pain, nausea, vomiting or diarrhea, melena or bright red blood per rectum. GENITOURINARY:  No urinary frequency, urgency, hesitancy or dysuria. MUSCULOSKELETAL: No joint or muscle pain, no back pain, no recent trauma. DERMATOLOGIC: No rash, no itching, no lesions. ENDOCRINE: No polyuria, polydipsia, no heat or cold intolerance. No recent change in weight. HEMATOLOGICAL: No anemia or easy bruising or bleeding. NEUROLOGIC: No headache, seizures, numbness, tingling or weakness. PSYCHIATRIC: No depression, no loss of interest in normal activity or change in sleep pattern.     Exam:   BP 134/80   Ht 5' 4.8" (1.646 m)   Wt 135 lb 9.6 oz (61.5 kg)   BMI 22.70 kg/m   Body mass index is 22.7 kg/m.  General appearance : Well developed well nourished female. No acute distress HEENT: Eyes: no retinal hemorrhage or exudates,  Neck supple, trachea midline, no carotid bruits, no thyroidmegaly Lungs: Clear to auscultation, no rhonchi or wheezes, or rib retractions  Heart: Regular rate and rhythm, no murmurs or gallops Breast:Examined in sitting and supine position were symmetrical in appearance, no palpable masses or tenderness,  no skin retraction, no nipple inversion, no nipple discharge, no skin discoloration, no axillary or supraclavicular lymphadenopathy Abdomen: no palpable masses or tenderness, no rebound or guarding Extremities: no edema or skin discoloration or tenderness  Pelvic: Vulva: Normal             Vagina: No gross lesions or discharge  Cervix: No gross lesions or discharge.  Pap/HPV HR done.  Uterus  AV, normal size,  shape and consistency, non-tender and mobile  Adnexa  Without masses or tenderness  Anus: Not examined, treating an anal fissure.   Assessment/Plan:  63 y.o. female for annual exam   1. Encounter for routine gynecological examination with Papanicolaou smear of cervix Normal gynecologic exam.  Pap test with high-risk HPV done today.  Breast exam normal.  Screening mammogram May 16, 2018 was benign.  Health labs with family physician.  Up-to-date with  colonoscopy and followed by gastro for an anal fissure.  2. High risk human papilloma virus (HPV) infection of cervix Pap test with high-risk HPV done today.  3. Postmenopausal On no hormone replacement therapy.  4. Endometrial cancer ([place]) Referred to and seen by Dr. Denman George will proceed with her surgery soon.  Princess Bruins MD, 8:51 AM 06/13/2018

## 2018-06-17 LAB — PAP, TP IMAGING W/ HPV RNA, RFLX HPV TYPE 16,18/45: HPV DNA High Risk: NOT DETECTED

## 2018-06-19 ENCOUNTER — Encounter: Payer: Self-pay | Admitting: *Deleted

## 2018-06-20 ENCOUNTER — Ambulatory Visit: Payer: BLUE CROSS/BLUE SHIELD | Admitting: Obstetrics & Gynecology

## 2018-06-23 ENCOUNTER — Ambulatory Visit: Payer: Self-pay | Admitting: Surgery

## 2018-06-23 DIAGNOSIS — K602 Anal fissure, unspecified: Secondary | ICD-10-CM | POA: Diagnosis not present

## 2018-06-23 NOTE — H&P (Signed)
Rebecca Gordon [place] Documented: 06/23/2018 10:24 AM Location: [facility] Patient #: [id number] DOB: 09/30/1955 Married / Language: English / Race: White Female  History of Present Illness Adin Hector MD; 06/23/2018 11:24 AM) The patient is a 63 year old female who presents with anal pain. Note for "Anal pain": ` ` ` Patient sent for surgical consultation at the request of Dr. Denman George.  Chief Complaint: Severe anal pain/dyschezia. 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 ` ` The patient is a woman that has been struggling with anal pain for at least 6 months, possibly longer. She describes severe sharp pain with bowel movements. She has a family history of colon cancer and polyps. Followed by Dr. Collene Mares with gastroenterology. Records not available, but the patient has followed up with Dr. Collene Mares for these concerns. He'll fissure strongly suspected. She been given trials of diltiazem cream. More recently switched to nitroglycerin cream. Topical lidocaine as well. Nothing is really helping. I again tends to burn. Sounds like the patient has struggled with some constipation issues. Has been on some stool softeners. Moving her bowels once or twice a day. Sharp pain with bowel movements. Burning can last for hours. Worse lately all day. No history of other major anorectal issues. Initially she thought this may be hemorrhoids but he didn't get better with soaks and other interventions. She tried suppositories which made it feel worse. She has not had any prior surgery intervention.  She is due to be at her daughter's weddingl later this month. She is recently diagnosed with endometrial cancer. She saw Dr. Denman George with gynecological oncology. Plan for robotic hysterectomy later this month. No personal nor family history of GI/colon cancer, inflammatory bowel disease, irritable bowel syndrome, allergy such as Celiac Sprue, dietary/dairy problems, colitis, ulcers nor  gastritis. No recent sick contacts/gastroenteritis. No travel outside the country. No changes in diet. No dysphagia to solids or liquids. No significant heartburn or reflux. No hematochezia, hematemesis, coffee ground emesis. No evidence of prior gastric/peptic ulceration.  (Review of systems as stated in this history (HPI) or in the review of systems. Otherwise all other 12 point ROS are negative) ` ` `   Past Surgical History Nance Pew, CMA; 06/23/2018 10:25 AM) Breast Augmentation Bilateral. Colon Polyp Removal - Colonoscopy Foot Surgery Right. Knee Surgery Right. Shoulder Surgery Right. Tonsillectomy  Diagnostic Studies History Nance Pew, CMA; 06/23/2018 10:25 AM) Colonoscopy 1-5 years ago Mammogram within last year Pap Smear 1-5 years ago  Allergies Nance Pew, CMA; 06/23/2018 10:29 AM) Gabapentin *CHEMICALS* Allergies Reconciled  Medication History (Sabrina Canty, CMA; 06/23/2018 10:30 AM) HYDROcodone-Acetaminophen (5-325MG  Tablet, Oral) Active. oxyCODONE HCl (5MG  Tablet, Oral) Active. Alendronate Sodium (70MG  Tablet, Oral) Active. amLODIPine Besylate (10MG  Tablet, Oral) Active. Losartan Potassium (100MG  Tablet, Oral) Active. Ibuprofen (800MG  Tablet, Oral) Active. Megestrol Acetate (40MG  Tablet, Oral) Active. Metoprolol Succinate ER (100MG  Tablet ER 24HR, Oral) Active. traZODone HCl (50MG  Tablet, Oral) Active. hydroCHLOROthiazide (12.5MG  Capsule, Oral) Active. Fluticasone Propionate (50MCG/ACT Suspension, Nasal) Active. Desvenlafaxine Succinate ER (50MG  Tablet ER 24HR, Oral) Active. Chantix (1MG  Tablet, Oral) Active. Medications Reconciled  Social History Nance Pew, CMA; 06/23/2018 10:25 AM) Alcohol use Recently quit alcohol use. Caffeine use Coffee. Illicit drug use Prefer to discuss with provider. Tobacco use Current every day smoker.  Family History Nance Pew, [place]; 06/23/2018 10:25 AM) Cancer Father. Heart  Disease Mother. Heart disease in female family member before age 10 Melanoma Father. Prostate Cancer Father.  Pregnancy / Birth History Nance Pew, [place]; 06/23/2018 10:25 AM) Age at  menarche 12 years. Age of menopause 51-55 Contraceptive History Oral contraceptives. Gravida 2 Maternal age 41-35 Para 2  Other Problems Nance Pew, CMA; 06/23/2018 10:25 AM) Alcohol Abuse Anxiety Disorder Arthritis Cancer Cerebrovascular Accident Depression Gastroesophageal Reflux Disease Hemorrhoids High blood pressure Hypercholesterolemia     Review of Systems (Sabrina Canty CMA; 06/23/2018 10:25 AM) General Not Present- Appetite Loss, Chills, Fatigue, Fever, Night Sweats, Weight Gain and Weight Loss. Skin Not Present- Change in Wart/Mole, Dryness, Hives, Jaundice, New Lesions, Non-Healing Wounds, Rash and Ulcer. HEENT Present- Seasonal Allergies and Wears glasses/contact lenses. Not Present- Earache, Hearing Loss, Hoarseness, Nose Bleed, Oral Ulcers, Ringing in the Ears, Sinus Pain, Sore Throat, Visual Disturbances and Yellow Eyes. Respiratory Present- Snoring. Not Present- Bloody sputum, Chronic Cough, Difficulty Breathing and Wheezing. Breast Not Present- Breast Mass, Breast Pain, Nipple Discharge and Skin Changes. Cardiovascular Not Present- Chest Pain, Difficulty Breathing Lying Down, Leg Cramps, Palpitations, Rapid Heart Rate, Shortness of Breath and Swelling of Extremities. Gastrointestinal Present- Bloating, Change in Bowel Habits, Constipation and Rectal Pain. Not Present- Abdominal Pain, Bloody Stool, Chronic diarrhea, Difficulty Swallowing, Excessive gas, Gets full quickly at meals, Hemorrhoids, Indigestion, Nausea and Vomiting. Female Genitourinary Not Present- Frequency, Nocturia, Painful Urination, Pelvic Pain and Urgency. Musculoskeletal Not Present- Back Pain, Joint Pain, Joint Stiffness, Muscle Pain, Muscle Weakness and Swelling of Extremities. Neurological  Present- Numbness. Not Present- Decreased Memory, Fainting, Headaches, Seizures, Tingling, Tremor, Trouble walking and Weakness. Psychiatric Present- Depression. Not Present- Anxiety, Bipolar, Change in Sleep Pattern, Fearful and Frequent crying. Endocrine Not Present- Cold Intolerance, Excessive Hunger, Hair Changes, Heat Intolerance, Hot flashes and New Diabetes. Hematology Not Present- Blood Thinners, Easy Bruising, Excessive bleeding, Gland problems, HIV and Persistent Infections.  Vitals (Sabrina Canty CMA; 06/23/2018 10:31 AM) 06/23/2018 10:30 AM Weight: 137.2 lb Height: 64in Body Surface Area: 1.67 m Body Mass Index: 23.55 kg/m  Temp.: 97.48F(Temporal)  Pulse: 98 (Regular)  P.OX: 89% (Room air) BP: 126/72 (Sitting, Left Arm, Standard)      Physical Exam Adin Hector MD; 06/23/2018 11:21 AM)  General Mental Status-Alert. General Appearance-Not in acute distress, Not Sickly. Orientation-Oriented X3. Hydration-Well hydrated. Voice-Normal.  Integumentary Global Assessment Upon inspection and palpation of skin surfaces of the - Axillae: non-tender, no inflammation or ulceration, no drainage. and Distribution of scalp and body hair is normal. General Characteristics Temperature - normal warmth is noted.  Head and Neck Head-normocephalic, atraumatic with no lesions or palpable masses. Face Global Assessment - atraumatic, no absence of expression. Neck Global Assessment - no abnormal movements, no bruit auscultated on the right, no bruit auscultated on the left, no decreased range of motion, non-tender. Trachea-midline. Thyroid Gland Characteristics - non-tender.  Eye Eyeball - Left-Extraocular movements intact, No Nystagmus. Eyeball - Right-Extraocular movements intact, No Nystagmus. Cornea - Left-No Hazy. Cornea - Right-No Hazy. Sclera/Conjunctiva - Left-No scleral icterus, No Discharge. Sclera/Conjunctiva - Right-No scleral  icterus, No Discharge. Pupil - Left-Direct reaction to light normal. Pupil - Right-Direct reaction to light normal.  ENMT Ears Pinna - Left - no drainage observed, no generalized tenderness observed. Right - no drainage observed, no generalized tenderness observed. Nose and Sinuses External Inspection of the Nose - no destructive lesion observed. Inspection of the nares - Left - quiet respiration. Right - quiet respiration. Mouth and Throat Lips - Upper Lip - no fissures observed, no pallor noted. Lower Lip - no fissures observed, no pallor noted. Nasopharynx - no discharge present. Oral Cavity/Oropharynx - Tongue - no dryness observed. Oral Mucosa - no cyanosis observed.  Hypopharynx - no evidence of airway distress observed.  Chest and Lung Exam Inspection Movements - Normal and Symmetrical. Accessory muscles - No use of accessory muscles in breathing. Palpation Palpation of the chest reveals - Non-tender. Auscultation Breath sounds - Normal and Clear.  Cardiovascular Auscultation Rhythm - Regular. Murmurs & Other Heart Sounds - Auscultation of the heart reveals - No Murmurs and No Systolic Clicks.  Abdomen Inspection Inspection of the abdomen reveals - No Visible peristalsis and No Abnormal pulsations. Umbilicus - No Bleeding, No Urine drainage. Palpation/Percussion Palpation and Percussion of the abdomen reveal - Soft, Non Tender, No Rebound tenderness, No Rigidity (guarding) and No Cutaneous hyperesthesia. Note: Abdomen soft. Not severely distended. No distasis recti. Very small hernia and base of the umbilical stalk. No incisions. No other anterior abdominal wall hernias  Female Genitourinary Sexual Maturity Tanner 5 - Adult hair pattern. Note: No vaginal bleeding nor discharge. No inguinal hernias. No lymphadenopathy  Rectal Note: Perianal skin clear.  Markedly increased sphincter tone.  Very sensitive at posterior midline suspicious for anal fissure other I  cannot definitely confirm it. Held off on any digital anorectal examination due to her sharp pain and increased sphincter tone.   Mildly redundant perianal tissue but no major external hemorrhoid. No thrombus hemorrhoid. No evidence of any abscess nor fistula. No pilonidal disease.  Peripheral Vascular Upper Extremity Inspection - Left - No Cyanotic nailbeds, Not Ischemic. Right - No Cyanotic nailbeds, Not Ischemic.  Neurologic Neurologic evaluation reveals -normal attention span and ability to concentrate, able to name objects and repeat phrases. Appropriate fund of knowledge , normal sensation and normal coordination. Mental Status Affect - not angry, not paranoid. Cranial Nerves-Normal Bilaterally. Gait-Normal.  Neuropsychiatric Mental status exam performed with findings of-able to articulate well with normal speech/language, rate, volume and coherence, thought content normal with ability to perform basic computations and apply abstract reasoning and no evidence of hallucinations, delusions, obsessions or homicidal/suicidal ideation.  Musculoskeletal Global Assessment Spine, Ribs and Pelvis - no instability, subluxation or laxity. Right Upper Extremity - no instability, subluxation or laxity.  Lymphatic Head & Neck  General Head & Neck Lymphatics: Bilateral - Description - No Localized lymphadenopathy. Axillary  General Axillary Region: Bilateral - Description - No Localized lymphadenopathy. Femoral & Inguinal  Generalized Femoral & Inguinal Lymphatics: Left - Description - No Localized lymphadenopathy. Right - Description - No Localized lymphadenopathy.    Assessment & Plan Adin Hector MD; 06/23/2018 11:20 AM)  ANAL FISSURE (K60.2) Impression: Classic story for anal fissure refractory to diltiazem, nitroglycerin, bowel regimen. No evidence of hemorrhoidal disease, abscess nor fistula.  I think she is exhausted non-operative options. I think she would benefit  from examination of anesthesia. Probable partial internal sphincterotomy. Possible hemorrhoidal ligation.  This is a challenging time for her with her requiring a hysterectomy for her newly diagnosed cancer by Dr. Denman George later this month. So plan to go to her daughter's wedding soon as well.  We will see if we can do this before surgery or as a coordination case with her, although not ideal to do an anorectal case at the same time as a cleaner hysterectomy case. Might be able to do as soon as tomorrow.  Current Plans Pt Education - [facility] Anal Fissure ([place] Greg[place]) You are being scheduled for surgery- Our schedulers will call you.  You should hear from our office's scheduling department within 5 working days about the location, date, and time of surgery. We try to make accommodations for patient's preferences  in scheduling surgery, but sometimes the OR schedule or the surgeon's schedule prevents [place] from making those accommodations.  If you have not heard from our office [phone]) in 5 working days, call the office and ask for your surgeon's nurse.  If you have other questions about your diagnosis, plan, or surgery, call the office and ask for your surgeon's nurse.  The anatomy & physiology of the anorectal region was discussed. The pathophysiology of anal fissure and differential diagnosis was discussed. Natural history progression was discussed. I stressed the importance of a bowel regimen to have daily soft bowel movements to minimize progression of disease.  The patient's condition is not adequately controlled. Non-operative treatment has not healed the fissure. Therefore, I recommended examination under anesthesia for better examination to confirm the diagnosis and treat by lateral internal sphincterotomy to relax the spasm better & allow the fissure to heal. Technique, benefits, alternatives were discussed. I noted a good likelihood this will help address the problem. Risks such as  bleeding, pain, incontinence, recurrence, heart attack, death, and other risks were discussed.  Educational handouts further explaining the pathology, treatment options, and bowel regimen were given as well. The patient expressed understanding & wishes to proceed with surgery.  Pt Education - [facility] Good Bowel Health (Kenneith Stief) Started Naproxen 500 MG Oral Tablet, 1 (one) Tablet two times daily, #30, 06/23/2018, Ref. x1. Started Norco 5-325 MG Oral Tablet, 1-2 Tablet every six hours, as needed, #20, 06/23/2018, No Refill.  ENCOUNTER FOR PREOPERATIVE EXAMINATION FOR GENERAL SURGICAL PROCEDURE (Z01.818)  Current Plans Pt Education - [facility] Rectal Prep for Anorectal outpatient/office surgery: discussed with patient and provided information. Pt Education - [facility] Rectal Surgery HCI (Jacier Gladu): discussed   Adin Hector, MD, FACS, MASCRS Gastrointestinal and Minimally Invasive Surgery    [address] [place], [place] [address] [phone] Main / Paging [phone] Fax

## 2018-06-23 NOTE — H&P (View-Only) (Signed)
[place] Documented: 06/23/2018 10:24 AM Location: [facility] Patient #: [id number] DOB: 04-15-1956 Married / Language: English / Race: White Female  History of Present Illness Adin Hector MD; 06/23/2018 11:24 AM) The patient is a 63 year old female who presents with anal pain. Note for "Anal pain": ` ` ` Patient sent for surgical consultation at the request of Dr. Denman George.  Chief Complaint: Severe anal pain/dyschezia. 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 ` ` The patient is a woman that has been struggling with anal pain for at least 6 months, possibly longer. She describes severe sharp pain with bowel movements. She has a family history of colon cancer and polyps. Followed by Dr. Collene Mares with gastroenterology. Records not available, but the patient has followed up with Dr. Collene Mares for these concerns. He'll fissure strongly suspected. She been given trials of diltiazem cream. More recently switched to nitroglycerin cream. Topical lidocaine as well. Nothing is really helping. I again tends to burn. Sounds like the patient has struggled with some constipation issues. Has been on some stool softeners. Moving her bowels once or twice a day. Sharp pain with bowel movements. Burning can last for hours. Worse lately all day. No history of other major anorectal issues. Initially she thought this may be hemorrhoids but he didn't get better with soaks and other interventions. She tried suppositories which made it feel worse. She has not had any prior surgery intervention.  She is due to be at her daughter's weddingl later this month. She is recently diagnosed with endometrial cancer. She saw Dr. Denman George with gynecological oncology. Plan for robotic hysterectomy later this month. No personal nor family history of GI/colon cancer, inflammatory bowel disease, irritable bowel syndrome, allergy such as Celiac Sprue, dietary/dairy problems, colitis, ulcers nor  gastritis. No recent sick contacts/gastroenteritis. No travel outside the country. No changes in diet. No dysphagia to solids or liquids. No significant heartburn or reflux. No hematochezia, hematemesis, coffee ground emesis. No evidence of prior gastric/peptic ulceration.  (Review of systems as stated in this history (HPI) or in the review of systems. Otherwise all other 12 point ROS are negative) ` ` `   Past Surgical History Nance Pew, CMA; 06/23/2018 10:25 AM) Breast Augmentation Bilateral. Colon Polyp Removal - Colonoscopy Foot Surgery Right. Knee Surgery Right. Shoulder Surgery Right. Tonsillectomy  Diagnostic Studies History Nance Pew, CMA; 06/23/2018 10:25 AM) Colonoscopy 1-5 years ago Mammogram within last year Pap Smear 1-5 years ago  Allergies Nance Pew, CMA; 06/23/2018 10:29 AM) Gabapentin *CHEMICALS* Allergies Reconciled  Medication History (Sabrina Canty, CMA; 06/23/2018 10:30 AM) HYDROcodone-Acetaminophen (5-325MG  Tablet, Oral) Active. oxyCODONE HCl (5MG  Tablet, Oral) Active. Alendronate Sodium (70MG  Tablet, Oral) Active. amLODIPine Besylate (10MG  Tablet, Oral) Active. Losartan Potassium (100MG  Tablet, Oral) Active. Ibuprofen (800MG  Tablet, Oral) Active. Megestrol Acetate (40MG  Tablet, Oral) Active. Metoprolol Succinate ER (100MG  Tablet ER 24HR, Oral) Active. traZODone HCl (50MG  Tablet, Oral) Active. hydroCHLOROthiazide (12.5MG  Capsule, Oral) Active. Fluticasone Propionate (50MCG/ACT Suspension, Nasal) Active. Desvenlafaxine Succinate ER (50MG  Tablet ER 24HR, Oral) Active. Chantix (1MG  Tablet, Oral) Active. Medications Reconciled  Social History Nance Pew, CMA; 06/23/2018 10:25 AM) Alcohol use Recently quit alcohol use. Caffeine use Coffee. Illicit drug use Prefer to discuss with provider. Tobacco use Current every day smoker.  Family History Nance Pew, [place]; 06/23/2018 10:25 AM) Cancer Father. Heart  Disease Mother. Heart disease in female family member before age 72 Melanoma Father. Prostate Cancer Father.  Pregnancy / Birth History Nance Pew, [place]; 06/23/2018 10:25 AM) Age at  menarche 12 years. Age of menopause 51-55 Contraceptive History Oral contraceptives. Gravida 2 Maternal age 27-35 Para 2  Other Problems Nance Pew, CMA; 06/23/2018 10:25 AM) Alcohol Abuse Anxiety Disorder Arthritis Cancer Cerebrovascular Accident Depression Gastroesophageal Reflux Disease Hemorrhoids High blood pressure Hypercholesterolemia     Review of Systems (Sabrina Canty CMA; 06/23/2018 10:25 AM) General Not Present- Appetite Loss, Chills, Fatigue, Fever, Night Sweats, Weight Gain and Weight Loss. Skin Not Present- Change in Wart/Mole, Dryness, Hives, Jaundice, New Lesions, Non-Healing Wounds, Rash and Ulcer. HEENT Present- Seasonal Allergies and Wears glasses/contact lenses. Not Present- Earache, Hearing Loss, Hoarseness, Nose Bleed, Oral Ulcers, Ringing in the Ears, Sinus Pain, Sore Throat, Visual Disturbances and Yellow Eyes. Respiratory Present- Snoring. Not Present- Bloody sputum, Chronic Cough, Difficulty Breathing and Wheezing. Breast Not Present- Breast Mass, Breast Pain, Nipple Discharge and Skin Changes. Cardiovascular Not Present- Chest Pain, Difficulty Breathing Lying Down, Leg Cramps, Palpitations, Rapid Heart Rate, Shortness of Breath and Swelling of Extremities. Gastrointestinal Present- Bloating, Change in Bowel Habits, Constipation and Rectal Pain. Not Present- Abdominal Pain, Bloody Stool, Chronic diarrhea, Difficulty Swallowing, Excessive gas, Gets full quickly at meals, Hemorrhoids, Indigestion, Nausea and Vomiting. Female Genitourinary Not Present- Frequency, Nocturia, Painful Urination, Pelvic Pain and Urgency. Musculoskeletal Not Present- Back Pain, Joint Pain, Joint Stiffness, Muscle Pain, Muscle Weakness and Swelling of Extremities. Neurological  Present- Numbness. Not Present- Decreased Memory, Fainting, Headaches, Seizures, Tingling, Tremor, Trouble walking and Weakness. Psychiatric Present- Depression. Not Present- Anxiety, Bipolar, Change in Sleep Pattern, Fearful and Frequent crying. Endocrine Not Present- Cold Intolerance, Excessive Hunger, Hair Changes, Heat Intolerance, Hot flashes and New Diabetes. Hematology Not Present- Blood Thinners, Easy Bruising, Excessive bleeding, Gland problems, HIV and Persistent Infections.  Vitals (Sabrina Canty CMA; 06/23/2018 10:31 AM) 06/23/2018 10:30 AM Weight: 137.2 lb Height: 64in Body Surface Area: 1.67 m Body Mass Index: 23.55 kg/m  Temp.: 97.32F(Temporal)  Pulse: 98 (Regular)  P.OX: 89% (Room air) BP: 126/72 (Sitting, Left Arm, Standard)      Physical Exam Adin Hector MD; 06/23/2018 11:21 AM)  General Mental Status-Alert. General Appearance-Not in acute distress, Not Sickly. Orientation-Oriented X3. Hydration-Well hydrated. Voice-Normal.  Integumentary Global Assessment Upon inspection and palpation of skin surfaces of the - Axillae: non-tender, no inflammation or ulceration, no drainage. and Distribution of scalp and body hair is normal. General Characteristics Temperature - normal warmth is noted.  Head and Neck Head-normocephalic, atraumatic with no lesions or palpable masses. Face Global Assessment - atraumatic, no absence of expression. Neck Global Assessment - no abnormal movements, no bruit auscultated on the right, no bruit auscultated on the left, no decreased range of motion, non-tender. Trachea-midline. Thyroid Gland Characteristics - non-tender.  Eye Eyeball - Left-Extraocular movements intact, No Nystagmus. Eyeball - Right-Extraocular movements intact, No Nystagmus. Cornea - Left-No Hazy. Cornea - Right-No Hazy. Sclera/Conjunctiva - Left-No scleral icterus, No Discharge. Sclera/Conjunctiva - Right-No scleral  icterus, No Discharge. Pupil - Left-Direct reaction to light normal. Pupil - Right-Direct reaction to light normal.  ENMT Ears Pinna - Left - no drainage observed, no generalized tenderness observed. Right - no drainage observed, no generalized tenderness observed. Nose and Sinuses External Inspection of the Nose - no destructive lesion observed. Inspection of the nares - Left - quiet respiration. Right - quiet respiration. Mouth and Throat Lips - Upper Lip - no fissures observed, no pallor noted. Lower Lip - no fissures observed, no pallor noted. Nasopharynx - no discharge present. Oral Cavity/Oropharynx - Tongue - no dryness observed. Oral Mucosa - no cyanosis observed.  Hypopharynx - no evidence of airway distress observed.  Chest and Lung Exam Inspection Movements - Normal and Symmetrical. Accessory muscles - No use of accessory muscles in breathing. Palpation Palpation of the chest reveals - Non-tender. Auscultation Breath sounds - Normal and Clear.  Cardiovascular Auscultation Rhythm - Regular. Murmurs & Other Heart Sounds - Auscultation of the heart reveals - No Murmurs and No Systolic Clicks.  Abdomen Inspection Inspection of the abdomen reveals - No Visible peristalsis and No Abnormal pulsations. Umbilicus - No Bleeding, No Urine drainage. Palpation/Percussion Palpation and Percussion of the abdomen reveal - Soft, Non Tender, No Rebound tenderness, No Rigidity (guarding) and No Cutaneous hyperesthesia. Note: Abdomen soft. Not severely distended. No distasis recti. Very small hernia and base of the umbilical stalk. No incisions. No other anterior abdominal wall hernias  Female Genitourinary Sexual Maturity Tanner 5 - Adult hair pattern. Note: No vaginal bleeding nor discharge. No inguinal hernias. No lymphadenopathy  Rectal Note: Perianal skin clear.  Markedly increased sphincter tone.  Very sensitive at posterior midline suspicious for anal fissure other I  cannot definitely confirm it. Held off on any digital anorectal examination due to her sharp pain and increased sphincter tone.   Mildly redundant perianal tissue but no major external hemorrhoid. No thrombus hemorrhoid. No evidence of any abscess nor fistula. No pilonidal disease.  Peripheral Vascular Upper Extremity Inspection - Left - No Cyanotic nailbeds, Not Ischemic. Right - No Cyanotic nailbeds, Not Ischemic.  Neurologic Neurologic evaluation reveals -normal attention span and ability to concentrate, able to name objects and repeat phrases. Appropriate fund of knowledge , normal sensation and normal coordination. Mental Status Affect - not angry, not paranoid. Cranial Nerves-Normal Bilaterally. Gait-Normal.  Neuropsychiatric Mental status exam performed with findings of-able to articulate well with normal speech/language, rate, volume and coherence, thought content normal with ability to perform basic computations and apply abstract reasoning and no evidence of hallucinations, delusions, obsessions or homicidal/suicidal ideation.  Musculoskeletal Global Assessment Spine, Ribs and Pelvis - no instability, subluxation or laxity. Right Upper Extremity - no instability, subluxation or laxity.  Lymphatic Head & Neck  General Head & Neck Lymphatics: Bilateral - Description - No Localized lymphadenopathy. Axillary  General Axillary Region: Bilateral - Description - No Localized lymphadenopathy. Femoral & Inguinal  Generalized Femoral & Inguinal Lymphatics: Left - Description - No Localized lymphadenopathy. Right - Description - No Localized lymphadenopathy.    Assessment & Plan Adin Hector MD; 06/23/2018 11:20 AM)  ANAL FISSURE (K60.2) Impression: Classic story for anal fissure refractory to diltiazem, nitroglycerin, bowel regimen. No evidence of hemorrhoidal disease, abscess nor fistula.  I think she is exhausted non-operative options. I think she would benefit  from examination of anesthesia. Probable partial internal sphincterotomy. Possible hemorrhoidal ligation.  This is a challenging time for her with her requiring a hysterectomy for her newly diagnosed cancer by Dr. Denman George later this month. So plan to go to her daughter's wedding soon as well.  We will see if we can do this before surgery or as a coordination case with her, although not ideal to do an anorectal case at the same time as a cleaner hysterectomy case. Might be able to do as soon as tomorrow.  Current Plans Pt Education - [facility] Anal Fissure (Mechille Varghese) You are being scheduled for surgery- Our schedulers will call you.  You should hear from our office's scheduling department within 5 working days about the location, date, and time of surgery. We try to make accommodations for patient's preferences  in scheduling surgery, but sometimes the OR schedule or the surgeon's schedule prevents [place] from making those accommodations.  If you have not heard from our office [phone]) in 5 working days, call the office and ask for your surgeon's nurse.  If you have other questions about your diagnosis, plan, or surgery, call the office and ask for your surgeon's nurse.  The anatomy & physiology of the anorectal region was discussed. The pathophysiology of anal fissure and differential diagnosis was discussed. Natural history progression was discussed. I stressed the importance of a bowel regimen to have daily soft bowel movements to minimize progression of disease.  The patient's condition is not adequately controlled. Non-operative treatment has not healed the fissure. Therefore, I recommended examination under anesthesia for better examination to confirm the diagnosis and treat by lateral internal sphincterotomy to relax the spasm better & allow the fissure to heal. Technique, benefits, alternatives were discussed. I noted a good likelihood this will help address the problem. Risks such as  bleeding, pain, incontinence, recurrence, heart attack, death, and other risks were discussed.  Educational handouts further explaining the pathology, treatment options, and bowel regimen were given as well. The patient expressed understanding & wishes to proceed with surgery.  Pt Education - [facility] Good Bowel Health (Shonia Skilling) Started Naproxen 500 MG Oral Tablet, 1 (one) Tablet two times daily, #30, 06/23/2018, Ref. x1. Started Norco 5-325 MG Oral Tablet, 1-2 Tablet every six hours, as needed, #20, 06/23/2018, No Refill.  ENCOUNTER FOR PREOPERATIVE EXAMINATION FOR GENERAL SURGICAL PROCEDURE (Z01.818)  Current Plans Pt Education - [facility] Rectal Prep for Anorectal outpatient/office surgery: discussed with patient and provided information. Pt Education - [facility] Rectal Surgery HCI (Estefana Taylor): discussed   Adin Hector, MD, FACS, MASCRS Gastrointestinal and Minimally Invasive Surgery    [address], [place], [place] [address] [phone] Main / Paging [phone] Fax

## 2018-06-24 ENCOUNTER — Encounter (HOSPITAL_BASED_OUTPATIENT_CLINIC_OR_DEPARTMENT_OTHER): Payer: Self-pay

## 2018-06-24 ENCOUNTER — Ambulatory Visit (HOSPITAL_BASED_OUTPATIENT_CLINIC_OR_DEPARTMENT_OTHER): Payer: BLUE CROSS/BLUE SHIELD | Admitting: Anesthesiology

## 2018-06-24 ENCOUNTER — Other Ambulatory Visit: Payer: Self-pay

## 2018-06-24 ENCOUNTER — Encounter (HOSPITAL_BASED_OUTPATIENT_CLINIC_OR_DEPARTMENT_OTHER)
Admission: RE | Disposition: A | Discharge status: Home / Self Care | Payer: Self-pay | Source: Home / Self Care | Attending: Surgery

## 2018-06-24 ENCOUNTER — Ambulatory Visit (HOSPITAL_BASED_OUTPATIENT_CLINIC_OR_DEPARTMENT_OTHER)
Admission: RE | Admit: 2018-06-24 | Discharge: 2018-06-24 | Disposition: A | Discharge status: Home / Self Care | Payer: BLUE CROSS/BLUE SHIELD | Attending: Surgery | Admitting: Surgery

## 2018-06-24 DIAGNOSIS — Z888 Allergy status to other drugs, medicaments and biological substances status: Secondary | ICD-10-CM | POA: Diagnosis not present

## 2018-06-24 DIAGNOSIS — J439 Emphysema, unspecified: Secondary | ICD-10-CM | POA: Diagnosis not present

## 2018-06-24 DIAGNOSIS — I2510 Atherosclerotic heart disease of native coronary artery without angina pectoris: Secondary | ICD-10-CM | POA: Diagnosis not present

## 2018-06-24 DIAGNOSIS — K649 Unspecified hemorrhoids: Secondary | ICD-10-CM | POA: Diagnosis not present

## 2018-06-24 DIAGNOSIS — Z8042 Family history of malignant neoplasm of prostate: Secondary | ICD-10-CM | POA: Insufficient documentation

## 2018-06-24 DIAGNOSIS — E785 Hyperlipidemia, unspecified: Secondary | ICD-10-CM | POA: Insufficient documentation

## 2018-06-24 DIAGNOSIS — K601 Chronic anal fissure: Secondary | ICD-10-CM | POA: Insufficient documentation

## 2018-06-24 DIAGNOSIS — F17210 Nicotine dependence, cigarettes, uncomplicated: Secondary | ICD-10-CM | POA: Insufficient documentation

## 2018-06-24 DIAGNOSIS — Z86010 Personal history of colonic polyps: Secondary | ICD-10-CM | POA: Insufficient documentation

## 2018-06-24 DIAGNOSIS — K602 Anal fissure, unspecified: Secondary | ICD-10-CM | POA: Diagnosis not present

## 2018-06-24 DIAGNOSIS — E7800 Pure hypercholesterolemia, unspecified: Secondary | ICD-10-CM | POA: Diagnosis not present

## 2018-06-24 DIAGNOSIS — I10 Essential (primary) hypertension: Secondary | ICD-10-CM | POA: Diagnosis not present

## 2018-06-24 DIAGNOSIS — F419 Anxiety disorder, unspecified: Secondary | ICD-10-CM | POA: Diagnosis not present

## 2018-06-24 DIAGNOSIS — Z7982 Long term (current) use of aspirin: Secondary | ICD-10-CM | POA: Diagnosis not present

## 2018-06-24 DIAGNOSIS — Z800 Family history of malignant neoplasm of digestive organs: Secondary | ICD-10-CM | POA: Insufficient documentation

## 2018-06-24 DIAGNOSIS — F1021 Alcohol dependence, in remission: Secondary | ICD-10-CM | POA: Insufficient documentation

## 2018-06-24 DIAGNOSIS — F329 Major depressive disorder, single episode, unspecified: Secondary | ICD-10-CM | POA: Diagnosis not present

## 2018-06-24 DIAGNOSIS — Z8673 Personal history of transient ischemic attack (TIA), and cerebral infarction without residual deficits: Secondary | ICD-10-CM | POA: Insufficient documentation

## 2018-06-24 DIAGNOSIS — Z808 Family history of malignant neoplasm of other organs or systems: Secondary | ICD-10-CM | POA: Insufficient documentation

## 2018-06-24 DIAGNOSIS — K642 Third degree hemorrhoids: Secondary | ICD-10-CM | POA: Insufficient documentation

## 2018-06-24 DIAGNOSIS — Z8249 Family history of ischemic heart disease and other diseases of the circulatory system: Secondary | ICD-10-CM | POA: Insufficient documentation

## 2018-06-24 DIAGNOSIS — K641 Second degree hemorrhoids: Secondary | ICD-10-CM | POA: Diagnosis not present

## 2018-06-24 DIAGNOSIS — K219 Gastro-esophageal reflux disease without esophagitis: Secondary | ICD-10-CM | POA: Diagnosis not present

## 2018-06-24 DIAGNOSIS — Z79899 Other long term (current) drug therapy: Secondary | ICD-10-CM | POA: Diagnosis not present

## 2018-06-24 DIAGNOSIS — Z809 Family history of malignant neoplasm, unspecified: Secondary | ICD-10-CM | POA: Insufficient documentation

## 2018-06-24 DIAGNOSIS — Z791 Long term (current) use of non-steroidal anti-inflammatories (NSAID): Secondary | ICD-10-CM | POA: Insufficient documentation

## 2018-06-24 DIAGNOSIS — C541 Malignant neoplasm of endometrium: Secondary | ICD-10-CM | POA: Diagnosis not present

## 2018-06-24 DIAGNOSIS — G8918 Other acute postprocedural pain: Secondary | ICD-10-CM

## 2018-06-24 DIAGNOSIS — M1990 Unspecified osteoarthritis, unspecified site: Secondary | ICD-10-CM | POA: Insufficient documentation

## 2018-06-24 DIAGNOSIS — K648 Other hemorrhoids: Secondary | ICD-10-CM | POA: Diagnosis not present

## 2018-06-24 HISTORY — PX: HEMORRHOID SURGERY: SHX153

## 2018-06-24 HISTORY — PX: SPHINCTEROTOMY: SHX5279

## 2018-06-24 SURGERY — SPHINCTEROTOMY, ANAL
Anesthesia: General | Site: Rectum

## 2018-06-24 MED ORDER — OXYCODONE HCL 5 MG/5ML PO SOLN
5.0000 mg | Freq: Once | ORAL | Status: DC | PRN
  Filled 2018-06-24: qty 5

## 2018-06-24 MED ORDER — CELECOXIB 200 MG PO CAPS
ORAL_CAPSULE | ORAL | Status: AC
  Filled 2018-06-24: qty 1

## 2018-06-24 MED ORDER — BUPIVACAINE LIPOSOME 1.3 % IJ SUSP
INTRAMUSCULAR | Status: DC | PRN
  Administered 2018-06-24 (×2): 10 mL

## 2018-06-24 MED ORDER — MEPERIDINE HCL 25 MG/ML IJ SOLN
6.2500 mg | INTRAMUSCULAR | Status: DC | PRN
  Filled 2018-06-24: qty 1

## 2018-06-24 MED ORDER — DEXAMETHASONE SODIUM PHOSPHATE 10 MG/ML IJ SOLN
INTRAMUSCULAR | Status: DC | PRN
  Administered 2018-06-24: 10 mg via INTRAVENOUS

## 2018-06-24 MED ORDER — LIDOCAINE 2% (20 MG/ML) 5 ML SYRINGE
INTRAMUSCULAR | Status: AC
  Filled 2018-06-24: qty 5

## 2018-06-24 MED ORDER — BUPIVACAINE-EPINEPHRINE 0.25% -1:200000 IJ SOLN
INTRAMUSCULAR | Status: DC | PRN
  Administered 2018-06-24: 10 mL
  Administered 2018-06-24: 20 mL

## 2018-06-24 MED ORDER — METRONIDAZOLE IN NACL 5-0.79 MG/ML-% IV SOLN
INTRAVENOUS | Status: AC
  Filled 2018-06-24: qty 100

## 2018-06-24 MED ORDER — ACETAMINOPHEN 500 MG PO TABS
ORAL_TABLET | ORAL | Status: AC
  Filled 2018-06-24: qty 2

## 2018-06-24 MED ORDER — SUGAMMADEX SODIUM 200 MG/2ML IV SOLN
INTRAVENOUS | Status: DC | PRN
  Administered 2018-06-24: 140 mg via INTRAVENOUS

## 2018-06-24 MED ORDER — OXYCODONE HCL 5 MG PO TABS: 5.0000 mg | ORAL_TABLET | Freq: Four times a day (QID) | ORAL | 0 refills | Status: DC | PRN

## 2018-06-24 MED ORDER — MIDAZOLAM HCL 2 MG/2ML IJ SOLN
INTRAMUSCULAR | Status: AC
  Filled 2018-06-24: qty 2

## 2018-06-24 MED ORDER — LACTATED RINGERS IV SOLN
INTRAVENOUS | Status: DC
  Administered 2018-06-24 (×2): via INTRAVENOUS
  Filled 2018-06-24: qty 1000

## 2018-06-24 MED ORDER — FENTANYL CITRATE (PF) 100 MCG/2ML IJ SOLN
INTRAMUSCULAR | Status: AC
  Filled 2018-06-24: qty 2

## 2018-06-24 MED ORDER — SUGAMMADEX SODIUM 200 MG/2ML IV SOLN
INTRAVENOUS | Status: AC
  Filled 2018-06-24: qty 2

## 2018-06-24 MED ORDER — PROPOFOL 10 MG/ML IV BOLUS
INTRAVENOUS | Status: AC
  Filled 2018-06-24: qty 20

## 2018-06-24 MED ORDER — CHLORHEXIDINE GLUCONATE CLOTH 2 % EX PADS
6.0000 | MEDICATED_PAD | Freq: Once | CUTANEOUS | Status: DC
  Filled 2018-06-24: qty 6

## 2018-06-24 MED ORDER — ACETAMINOPHEN 160 MG/5ML PO SOLN
325.0000 mg | ORAL | Status: DC | PRN
  Filled 2018-06-24: qty 20.3

## 2018-06-24 MED ORDER — CELECOXIB 200 MG PO CAPS
200.0000 mg | ORAL_CAPSULE | ORAL | Status: AC
  Administered 2018-06-24: 200 mg via ORAL
  Filled 2018-06-24: qty 1

## 2018-06-24 MED ORDER — SUCCINYLCHOLINE CHLORIDE 200 MG/10ML IV SOSY
PREFILLED_SYRINGE | INTRAVENOUS | Status: AC
  Filled 2018-06-24: qty 10

## 2018-06-24 MED ORDER — ROCURONIUM BROMIDE 100 MG/10ML IV SOLN
INTRAVENOUS | Status: AC
  Filled 2018-06-24: qty 1

## 2018-06-24 MED ORDER — DEXAMETHASONE SODIUM PHOSPHATE 10 MG/ML IJ SOLN
INTRAMUSCULAR | Status: AC
  Filled 2018-06-24: qty 1

## 2018-06-24 MED ORDER — METRONIDAZOLE IN NACL 5-0.79 MG/ML-% IV SOLN
500.0000 mg | INTRAVENOUS | Status: AC
  Administered 2018-06-24: 500 mg via INTRAVENOUS
  Filled 2018-06-24: qty 100

## 2018-06-24 MED ORDER — DIBUCAINE 1 % RE OINT
TOPICAL_OINTMENT | RECTAL | Status: DC | PRN
  Administered 2018-06-24: 1 via RECTAL

## 2018-06-24 MED ORDER — PHENYLEPHRINE 40 MCG/ML (10ML) SYRINGE FOR IV PUSH (FOR BLOOD PRESSURE SUPPORT)
PREFILLED_SYRINGE | INTRAVENOUS | Status: DC | PRN
  Administered 2018-06-24: 120 ug via INTRAVENOUS

## 2018-06-24 MED ORDER — ONDANSETRON HCL 4 MG/2ML IJ SOLN
INTRAMUSCULAR | Status: DC | PRN
  Administered 2018-06-24: 4 mg via INTRAVENOUS

## 2018-06-24 MED ORDER — FENTANYL CITRATE (PF) 100 MCG/2ML IJ SOLN
25.0000 ug | INTRAMUSCULAR | Status: DC | PRN
  Filled 2018-06-24: qty 1

## 2018-06-24 MED ORDER — OXYCODONE HCL 5 MG PO TABS
5.0000 mg | ORAL_TABLET | Freq: Once | ORAL | Status: DC | PRN
  Filled 2018-06-24: qty 1

## 2018-06-24 MED ORDER — MIDAZOLAM HCL 5 MG/5ML IJ SOLN
INTRAMUSCULAR | Status: DC | PRN
  Administered 2018-06-24: 2 mg via INTRAVENOUS

## 2018-06-24 MED ORDER — PROPOFOL 10 MG/ML IV BOLUS
INTRAVENOUS | Status: DC | PRN
  Administered 2018-06-24: 130 mg via INTRAVENOUS

## 2018-06-24 MED ORDER — ACETAMINOPHEN 500 MG PO TABS
1000.0000 mg | ORAL_TABLET | ORAL | Status: AC
  Administered 2018-06-24: 1000 mg via ORAL
  Filled 2018-06-24: qty 2

## 2018-06-24 MED ORDER — ONDANSETRON HCL 4 MG/2ML IJ SOLN
4.0000 mg | Freq: Once | INTRAMUSCULAR | Status: DC | PRN
  Filled 2018-06-24: qty 2

## 2018-06-24 MED ORDER — CEFAZOLIN SODIUM-DEXTROSE 2-4 GM/100ML-% IV SOLN
INTRAVENOUS | Status: AC
  Filled 2018-06-24: qty 100

## 2018-06-24 MED ORDER — FENTANYL CITRATE (PF) 100 MCG/2ML IJ SOLN
INTRAMUSCULAR | Status: DC | PRN
  Administered 2018-06-24 (×2): 50 ug via INTRAVENOUS

## 2018-06-24 MED ORDER — PHENYLEPHRINE 40 MCG/ML (10ML) SYRINGE FOR IV PUSH (FOR BLOOD PRESSURE SUPPORT)
PREFILLED_SYRINGE | INTRAVENOUS | Status: AC
  Filled 2018-06-24: qty 10

## 2018-06-24 MED ORDER — CEFAZOLIN SODIUM-DEXTROSE 2-4 GM/100ML-% IV SOLN
2.0000 g | INTRAVENOUS | Status: AC
  Administered 2018-06-24: 2 g via INTRAVENOUS
  Filled 2018-06-24: qty 100

## 2018-06-24 MED ORDER — BUPIVACAINE LIPOSOME 1.3 % IJ SUSP
20.0000 mL | Freq: Once | INTRAMUSCULAR | Status: DC
  Filled 2018-06-24: qty 20

## 2018-06-24 MED ORDER — ONDANSETRON HCL 4 MG/2ML IJ SOLN
INTRAMUSCULAR | Status: AC
  Filled 2018-06-24: qty 2

## 2018-06-24 MED ORDER — ROCURONIUM BROMIDE 50 MG/5ML IV SOSY
PREFILLED_SYRINGE | INTRAVENOUS | Status: DC | PRN
  Administered 2018-06-24: 40 mg via INTRAVENOUS

## 2018-06-24 MED ORDER — ACETAMINOPHEN 325 MG PO TABS
325.0000 mg | ORAL_TABLET | ORAL | Status: DC | PRN
  Filled 2018-06-24: qty 2

## 2018-06-24 MED ORDER — LIDOCAINE 2% (20 MG/ML) 5 ML SYRINGE
INTRAMUSCULAR | Status: DC | PRN
  Administered 2018-06-24: 60 mg via INTRAVENOUS

## 2018-06-24 SURGICAL SUPPLY — 57 items
APL SKNCLS STERI-STRIP NONHPOA (GAUZE/BANDAGES/DRESSINGS) ×1
BENZOIN TINCTURE PRP APPL 2/3 (GAUZE/BANDAGES/DRESSINGS) ×3 IMPLANT
BLADE HEX COATED 2.75 (ELECTRODE) ×3 IMPLANT
BLADE SURG 10 STRL SS (BLADE) IMPLANT
BLADE SURG 15 STRL LF DISP TIS (BLADE) ×1 IMPLANT
BLADE SURG 15 STRL SS (BLADE) ×3
BRIEF STRETCH FOR OB PAD LRG (UNDERPADS AND DIAPERS) ×3 IMPLANT
CANISTER SUCTION 1200CC (MISCELLANEOUS) ×3 IMPLANT
COVER BACK TABLE 60X90IN (DRAPES) ×3 IMPLANT
COVER MAYO STAND STRL (DRAPES) ×1 IMPLANT
COVER WAND RF STERILE (DRAPES) ×4 IMPLANT
DECANTER SPIKE VIAL GLASS SM (MISCELLANEOUS) ×1 IMPLANT
DRAPE HYSTEROSCOPY (DRAPE) IMPLANT
DRAPE LAPAROTOMY 100X72 PEDS (DRAPES) ×3 IMPLANT
DRAPE SHEET LG 3/4 BI-LAMINATE (DRAPES) IMPLANT
ELECT NDL TIP 2.8 STRL (NEEDLE) IMPLANT
ELECT NEEDLE TIP 2.8 STRL (NEEDLE) IMPLANT
ELECT REM PT RETURN 9FT ADLT (ELECTROSURGICAL) ×3
ELECTRODE REM PT RTRN 9FT ADLT (ELECTROSURGICAL) ×1 IMPLANT
FILTER STRAW (MISCELLANEOUS) ×1 IMPLANT
GAUZE SPONGE 4X4 12PLY STRL (GAUZE/BANDAGES/DRESSINGS) ×2 IMPLANT
GLOVE BIOGEL PI IND STRL 8 (GLOVE) ×1 IMPLANT
GLOVE BIOGEL PI INDICATOR 8 (GLOVE) ×2
GLOVE ECLIPSE 8.0 STRL XLNG CF (GLOVE) ×3 IMPLANT
GLOVE INDICATOR 8.0 STRL GRN (GLOVE) ×3 IMPLANT
GOWN STRL REUS W/TWL XL LVL3 (GOWN DISPOSABLE) ×3 IMPLANT
IV CATH PLACEMENT 20 GA (IV SOLUTION) ×1 IMPLANT
KIT TURNOVER CYSTO (KITS) ×3 IMPLANT
LEGGING LITHOTOMY PAIR STRL (DRAPES) IMPLANT
NEEDLE HYPO 22GX1.5 SAFETY (NEEDLE) ×3 IMPLANT
NS IRRIG 500ML POUR BTL (IV SOLUTION) ×3 IMPLANT
PACK BASIN DAY SURGERY FS (CUSTOM PROCEDURE TRAY) ×3 IMPLANT
PAD ABD 8X10 STRL (GAUZE/BANDAGES/DRESSINGS) ×3 IMPLANT
PAD PREP 24X48 CUFFED NSTRL (MISCELLANEOUS) IMPLANT
PENCIL BUTTON HOLSTER BLD 10FT (ELECTRODE) ×3 IMPLANT
SCRUB TECHNI CARE 4 OZ NO DYE (MISCELLANEOUS) ×3 IMPLANT
SHEARS HARMONIC 9CM CVD (BLADE) IMPLANT
SURGILUBE 2OZ TUBE FLIPTOP (MISCELLANEOUS) ×3 IMPLANT
SUT CHROMIC 2 0 SH (SUTURE) ×6 IMPLANT
SUT CHROMIC 3 0 SH 27 (SUTURE) IMPLANT
SUT VIC AB 2-0 SH 27 (SUTURE)
SUT VIC AB 2-0 SH 27XBRD (SUTURE) IMPLANT
SUT VIC AB 2-0 UR6 27 (SUTURE) ×12 IMPLANT
SUT VICRYL 0 UR6 27IN ABS (SUTURE) IMPLANT
SUT VICRYL AB 2 0 TIE (SUTURE) IMPLANT
SUT VICRYL AB 2 0 TIES (SUTURE)
SYR 20CC LL (SYRINGE) ×3 IMPLANT
SYR 27GX1/2 1ML LL SAFETY (SYRINGE) ×1 IMPLANT
SYR BULB IRRIGATION 50ML (SYRINGE) ×1 IMPLANT
SYR CONTROL 10ML LL (SYRINGE) IMPLANT
TAPE CLOTH 3X10 TAN LF (GAUZE/BANDAGES/DRESSINGS) ×3 IMPLANT
TOWEL OR 17X26 10 PK STRL BLUE (TOWEL DISPOSABLE) ×4 IMPLANT
TRAY DSU PREP LF (CUSTOM PROCEDURE TRAY) ×3 IMPLANT
TUBE CONNECTING 12'X1/4 (SUCTIONS) ×1
TUBE CONNECTING 12X1/4 (SUCTIONS) ×2 IMPLANT
UNDERPAD 30X30 (UNDERPADS AND DIAPERS) ×3 IMPLANT
YANKAUER SUCT BULB TIP NO VENT (SUCTIONS) ×3 IMPLANT

## 2018-06-24 NOTE — Op Note (Signed)
06/24/2018  2:24 PM  PATIENT:  Rebecca Gordon  63 y.o. female  Patient Care Team: [name], Alferd Apa, DO as PCP - General Princess Bruins, MD as Consulting Physician (Obstetrics and Gynecology) Dennie Bible, NP as Nurse Practitioner (Family Medicine) Michael Boston, MD as Consulting Physician (General Surgery) Everitt Amber, MD as Consulting Physician (Gynecologic Oncology) Juanita Craver, MD as Consulting Physician (Gastroenterology)  PRE-OPERATIVE DIAGNOSIS:  ANAL FISSURE REFRACTORY TO MEDICAL MANAGEMENT  POST-OPERATIVE DIAGNOSIS:   ANAL FISSURE REFRACTORY TO MEDICAL MANAGEMENT GRADE 2/3 INTERNAL HEMORRHOIDS  PROCEDURE:  ANAL SPHINCTEROTOMY (partial left lateral internal sphincterotomy) HEMORRHOIDAL LIGATION/PEXY HEMORRHOIDECTOMY X 2 ANORECTAL EXAM UNDER ANESTHESIA  SURGEON:  Adin Hector, MD  ASSISTANT: OR Staff   ANESTHESIA:   General Anorectal & Local field block (0.25% bupivacaine with epinephrine mixed with Liposomal bupivacaine (Experel)   EBL:  No intake/output data recorded.  Delay start of Pharmacological VTE agent (>24hrs) due to surgical blood loss or risk of bleeding:  no  DRAINS: none   SPECIMEN:  Left lateral and right anterior internal hemorrhoid piles  DISPOSITION OF SPECIMEN:  PATHOLOGY  COUNTS:  YES  PLAN OF CARE: Discharge to home after PACU  PATIENT DISPOSITION:  PACU - hemodynamically stable.  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  I recommended examination and surgical treatment:  The anatomy & physiology of the anorectal region was discussed.  The pathophysiology of anal fissure and differential diagnosis was discussed.  Natural history progression  was discussed.   I stressed the importance of a bowel regimen to have daily soft bowel movements to minimize progression of disease.     The patient's condition is not adequately controlled.  Non-operative  treatment has not healed the fissure.  Therefore, I recommended examination under anesthesia for better examination to confirm the diagnosis and treat by lateral internal sphincterotomy to relax the spasm better & allow the fissure to heal.  Technique, benefits, alternatives were discussed.   I noted a good likelihood this will help address the problem.  Risks such as bleeding, pain, incontinence, recurrence, heart attack, death, and other risks were discussed.    Educational handouts further explaining the pathology, treatment options, and bowel regimen were given as well.  The patient expressed understanding & wishes to proceed with surgery.  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  Sphincterotomy location:  Left lateral anal canal.  60% distal internal sphincterotomy performed  Grade 2 left lateral and right anterior internal hemorrhoids.  Right posterior probably grade 3.   IDESCRIPTION:   Informed consent was confirmed. Patient underwent general anesthesia without difficulty. Patient was placed into  prone positioning.  The perianal region was prepped and draped in sterile fashion. Surgical timeout confirmed or plan.  I did digital rectal examination and then transitioned over to anoscopy to get a sense of the anatomy.  I identified an anal fissure in the posterior midline anal canal.  The sphincter tone was increased.  No stricture.  Enlarged internal hemorrhoids.  No abscess located.  No fistula   I proceeded to do hemorrhoidal ligation and pexy.  I used a 2-0 Vicryl suture on a UR-6 needle in a figure-of-eight fashion 6 cm proximal to the anal verge.  I started at the left lateral pile.  I ran that down closer to the distal rectum. I went ahead and proceeded with internal sphinterotmy technique.  I excised through the anoderm of the  left lateral anal canal longitudinally.  I identified the internal and external sphincters.  Elevating  the internal sphincter, I proceeded with a partial internal sphincterotomy starting distally and moving proximally using cautery.  This involved the distal 60% thickness.  This provided improved relaxation of the anal sphincter.  I then ran that suture down to close the hemorrhoidectomy wound tying down every figure-of-eight through to provide a pexy.  I excised excess left lateral hemorrhoidal pile and some external component as well.    I then did hemorrhoidal ligation and pexy at the other 5 columns.  Because of redundant hemorrhoidal tissue too bulky to merely ligate or pexy, I excised the excess internal hemorrhoid piles longitudinally in a fusiform biconcave fashion, at the  right anterior location, sparing the anal canal to avoid narrowing.  I then ran that stitch longitudinally more distally to close the hemorrhoidectomy wound to the anal verge over a Parks self retaining retractor & occasionally a large Hill-Furgeson retractor to avoid narrowing of the anal canal.  I then tied that stitch down to cause a hemorrhoidopexy.     At the completion of this, all 6 anorectal columns were ligated and pexied in the classic hexagonal fashion (right anterior/lateral/posterior, left anterior/lateral/posterior).  I closed the external part of the hemorrhoidectomy wounds with interrupted horizontal mattress 2-0 chromic suture, leaving the last 5 mm open to allow natural drainage.    I redid anoscopy & examination.  At completion of this, all hemorrhoids had been removed or reduced into the rectum.  There is no more prolapse.  Internal & external anatomy was more more normal.  Hemostasis was good.  Topical dibucaine ointment and fluffed gauze was on-laid over the perianal region.  No internal packing done.  Patient is being extubated go to go to the recovery room.  I had discussed postop care in detail with the patient in the preop holding area.  Instructions for post-operative recovery and prescriptions are written.  I discussed operative findings, updated the patient's status, discussed probable steps to recovery, and gave postoperative recommendations to the patient's spouse.  Recommendations were made.  Questions were answered.  He expressed understanding & appreciation.  Adin Hector, MD, FACS, MASCRS Gastrointestinal and Minimally Invasive Surgery    [address] [place], [place] [address] [phone] Main / Paging [phone] Fax

## 2018-06-24 NOTE — Anesthesia Preprocedure Evaluation (Signed)
Anesthesia Evaluation  Patient identified by MRN, date of birth, ID band Patient awake    Reviewed: Allergy & Precautions, NPO status , Patient's Chart, lab work & pertinent test results, reviewed documented beta blocker date and time   Airway Mallampati: I  TM Distance: >3 FB Neck ROM: Full    Dental  (+) Teeth Intact, Dental Advisory Given   Pulmonary Current Smoker, former smoker,    breath sounds clear to auscultation       Cardiovascular hypertension, Pt. on medications and Pt. on home beta blockers  Rhythm:Regular Rate:Normal     Neuro/Psych Anxiety Depression negative neurological ROS     GI/Hepatic Neg liver ROS, GERD  ,  Endo/Other  negative endocrine ROS  Renal/GU negative Renal ROS     Musculoskeletal  (+) Arthritis ,   Abdominal   Peds  Hematology negative hematology ROS (+)   Anesthesia Other Findings   Reproductive/Obstetrics                             Lab Results  Component Value Date   CREATININE 0.50 05/26/2018   BUN 21 05/26/2018   NA 139 05/26/2018   K 3.6 05/26/2018   CL 106 05/26/2018   CO2 24 05/26/2018   Lab Results  Component Value Date   WBC 6.2 05/26/2018   HGB 13.8 05/26/2018   HCT 42.9 05/26/2018   MCV 96.2 05/26/2018   PLT 244 05/26/2018    Anesthesia Physical  Anesthesia Plan  ASA: III  Anesthesia Plan: General   Post-op Pain Management:    Induction: Intravenous  PONV Risk Score and Plan: 2 and Ondansetron and Midazolam  Airway Management Planned: LMA and Oral ETT  Additional Equipment:   Intra-op Plan:   Post-operative Plan: Extubation in OR  Informed Consent: I have reviewed the patients History and Physical, chart, labs and discussed the procedure including the risks, benefits and alternatives for the proposed anesthesia with the patient or authorized representative who has indicated his/her understanding and acceptance.      Dental advisory given  Plan Discussed with: CRNA, Anesthesiologist and Surgeon  Anesthesia Plan Comments:         Anesthesia Quick Evaluation

## 2018-06-24 NOTE — Transfer of Care (Signed)
Immediate Anesthesia Transfer of Care Note  Patient: Rebecca Gordon  Procedure(s) Performed: ANAL SPHINCTEROTOMY, ANORECTAL EXAM UNDER ANESTHESIA (N/A Rectum) HEMORRHOIDECTOMY (N/A Rectum)  Patient Location: PACU  Anesthesia Type:General  Level of Consciousness: sedated and patient cooperative  Airway & Oxygen Therapy: Patient Spontanous Breathing and Patient connected to nasal cannula oxygen  Post-op Assessment: Report given to RN  Post vital signs: Reviewed and stable  Last Vitals:  Vitals Value Taken Time  BP 125/47 06/24/2018  2:37 PM  Temp 36.5 C 06/24/2018  2:37 PM  Pulse 89 06/24/2018  2:37 PM  Resp 16 06/24/2018  2:37 PM  SpO2 99 % 06/24/2018  2:37 PM    Last Pain:  Vitals:   06/24/18 1155  TempSrc:   PainSc: 2       Patients Stated Pain Goal: 6 (62/19/47 1252)  Complications: No apparent anesthesia complications

## 2018-06-24 NOTE — Interval H&P Note (Signed)
History and Physical Interval Note:  06/24/2018 1:07 PM  Rebecca Gordon  has presented today for surgery, with the diagnosis of [place].  The various methods of treatment have been discussed with the patient and family. After consideration of risks, benefits and other options for treatment, the patient has consented to  Procedure(s): ANAL SPHINCTEROTOMY, ANORECTAL EXAM UNDER ANESTHESIA (N/A) POSSIBLE HEMORRHOIDECTOMY (N/A) as a surgical intervention.  The patient's history has been reviewed, patient examined, no change in status, stable for surgery.  I have reviewed the patient's chart and labs.  Questions were answered to the patient's satisfaction.    I have re-reviewed the the patient's records, history, medications, and allergies.  I have re-examined the patient.  I again discussed intraoperative plans and goals of post-operative recovery.  The patient agrees to proceed.  Isela Stantz Franchot Mimes  1955/11/05 [id number]  Patient Care Team: [name], Alferd Apa, DO as PCP - General Princess Bruins, MD as Consulting Physician (Obstetrics and Gynecology) Dennie Bible, NP as Nurse Practitioner (Family Medicine) Michael Boston, MD as Consulting Physician (General Surgery) Everitt Amber, MD as Consulting Physician (Gynecologic Oncology) Juanita Craver, MD as Consulting Physician (Gastroenterology)  Patient Active Problem List   Diagnosis Date Noted  . Endometrial cancer ([place]) 06/05/2018  . Rectal tear 03/24/2018  . Preventative health care 08/09/2017  . Depression with anxiety 08/09/2017  . Hyperlipidemia LDL goal <100 08/09/2017  . Seasonal allergies 08/09/2017  . Osteoarthritis of knee 05/29/2017  . 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 Hx of anxiety disorder 04/03/2017  . Current smoker 04/03/2017  . Emphysema of lung ([place]) 04/03/2017  . Arteriosclerotic cardiovascular disease (ASCVD)  04/03/2017  . Alcoholism in remission ([place]) 04/02/2017  . History of transient ischemic attack (TIA) 09/27/2016  . Transient cerebral ischemia 09/27/2016  . Gastroesophageal reflux disease 09/27/2016  . Generalized anxiety disorder 09/27/2016  . Gait disturbance 09/23/2016  . Leukocytosis 09/23/2016  . Essential hypertension 09/23/2016  . HLD (hyperlipidemia) 09/23/2016  . Paresthesias 09/22/2016  . SINUSITIS- ACUTE-NOS 05/12/2008    Past Medical History:  Diagnosis Date  . Anal fissure   . 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 Anxiety   . Depression   . Endometrial polyp   . Former smoker 09/23/2016  . GERD (gastroesophageal reflux disease)    Nexium  . H/O clavicle fracture 2017   Right  . High cholesterol   . History of bronchitis   . History of pneumonia   . Hypertension   . LVH (left ventricular hypertrophy) 09/23/2016   Mild, noted on ECHO  . Numbness    left fingers after TIA  . OA (osteoarthritis)    "right knee"  . Osteoporosis 08/2017   T score -2.7  . Panic attacks   . Postmenopausal bleeding   . Thyroid nodule 2019   Multiple  . TIA (transient ischemic attack)    09/2016  . Wears contact lenses     Past Surgical History:  Procedure Laterality Date  . COLONOSCOPY  2017   multiples  . DILATATION & CURETTAGE/HYSTEROSCOPY WITH MYOSURE N/A 05/26/2018   Procedure: DILATATION & CURETTAGE/HYSTEROSCOPY WITH MYOSURE;  Surgeon: Princess Bruins, MD;  Location: [facility];  Service: Gynecology;  Laterality: N/A;  . EYE SURGERY Right    "growth on eye removed"  . FOOT SURGERY Right 08/2017   benign mass  . ORIF CLAVICULAR FRACTURE Right  04/28/2015   Procedure: REVISION ORIF RIGHT CLAVICAL FRACTURE, ALLOGRAFT BONE GRAFTING;  Surgeon: Justice Britain, MD;  Location: [facility];  Service: Orthopedics;  Laterality: Right;  . PLACEMENT OF BREAST IMPLANTS Bilateral   . TONSILLECTOMY      Social History   Socioeconomic  History  . Marital status: Married    Spouse name: Not on file  . Number of children: 2  . Years of education: College  . Highest education level: Not on file  Occupational History  . Occupation: Unemployed  Social Needs  . Financial resource strain: Not on file  . Food insecurity:    Worry: Not on file    Inability: Not on file  . Transportation needs:    Medical: Not on file    Non-medical: Not on file  Tobacco Use  . Smoking status: Current Every Day Smoker    Packs/day: 0.25    Years: 40.00    Pack years: 10.00    Types: Cigarettes  . Smokeless tobacco: Never Used  . Tobacco comment: Previous 1 pack a day  Substance and Sexual Activity  . Alcohol use: Not Currently    Comment: daily - 1 bottle of wine--- 09/23/2016, 3-4 glasses--- 10/15/16  . Drug use: No  . Sexual activity: Yes    Birth control/protection: Post-menopausal    Comment: 1st intercourse- 71, partners- 37, married- 34 yrs   Lifestyle  . Physical activity:    Days per week: Not on file    Minutes per session: Not on file  . Stress: Not on file  Relationships  . Social connections:    Talks on phone: Not on file    Gets together: Not on file    Attends religious service: Not on file    Active member of club or organization: Not on file    Attends meetings of clubs or organizations: Not on file    Relationship status: Not on file  . Intimate partner violence:    Fear of current or ex partner: Not on file    Emotionally abused: Not on file    Physically abused: Not on file    Forced sexual activity: Not on file  Other Topics Concern  . Not on file  Social History Narrative   Lives at home w/ her husband   Right-handed   Caffeine: 2-3 cups per day    Family History  Problem Relation Age of Onset  . Heart failure Mother   . Hypertension Father   . Brain cancer Father   . Prostate cancer Father   . Skin cancer Father     Medications Prior to Admission  Medication Sig Dispense Refill Last Dose   . alendronate (FOSAMAX) 70 MG tablet Take 1 tablet (70 mg total) by mouth every 7 (seven) days. Take with a full glass of water on an empty stomach. (Patient taking differently: Take 70 mg by mouth every Sunday. Take with a full glass of water on an empty stomach.) 12 tablet 4 06/23/2018 at Unknown time  . amLODipine (NORVASC) 10 MG tablet TAKE 1 TABLET BY MOUTH EVERY DAY (Patient taking differently: Take 10 mg by mouth daily. ) 90 tablet 3 06/23/2018 at Unknown time  . aspirin EC 81 MG tablet Take 81 mg by mouth daily.   06/23/2018 at Unknown time  . b complex vitamins tablet Take 1 tablet by mouth daily.   Past Month at Unknown time  . cholecalciferol (VITAMIN D) 1000 units tablet Take 1,000 Units by mouth  every morning.    06/23/2018 at Unknown time  . desvenlafaxine (PRISTIQ) 50 MG 24 hr tablet Take 1 tablet (50 mg total) by mouth daily. 90 tablet 3 06/23/2018 at Unknown time  . diltiazem 2 % GEL Apply 1 application topically 2 (two) times daily as needed (pain).    06/24/2018 at 1000  . docusate sodium (COLACE) 100 MG capsule Take 200 mg by mouth 2 (two) times daily.   Past Week at Unknown time  . hydrochlorothiazide (MICROZIDE) 12.5 MG capsule TAKE 1 CAPSULE BY MOUTH EVERY DAY (Patient taking differently: Take 12.5 mg by mouth daily. ) 90 capsule 3 06/23/2018 at Unknown time  . ibuprofen (ADVIL,MOTRIN) 800 MG tablet Take 1 tablet (800 mg total) by mouth every 8 (eight) hours as needed. For AFTER surgery (Patient taking differently: Take 800 mg by mouth every 8 (eight) hours as needed for moderate pain. For AFTER surgery) 30 tablet 0 06/23/2018 at Unknown time  . losartan (COZAAR) 100 MG tablet TAKE 1 TABLET BY MOUTH EVERY DAY (Patient taking differently: Take 100 mg by mouth daily. ) 90 tablet 3 06/23/2018 at Unknown time  . megestrol (MEGACE) 40 MG tablet Take 1 tablet (40 mg total) by mouth 2 (two) times daily. (Patient taking differently: Take 40 mg by mouth daily. ) 60 tablet 1 06/23/2018 at Unknown time  .  metoprolol succinate (TOPROL-XL) 100 MG 24 hr tablet TAKE 1 TABLET BY MOUTH DAILY. TAKE WITH OR IMMEDIATELY FOLLOWING A MEAL. (Patient taking differently: Take 100 mg by mouth daily. ) 90 tablet 1 06/23/2018 at 0600  . Omega 3 1200 MG CAPS Take 1,200 mg by mouth every evening.    06/23/2018 at Unknown time  . omeprazole (PRILOSEC) 20 MG capsule Take 20 mg by mouth daily.    06/23/2018 at Unknown time  . polyethylene glycol powder (GLYCOLAX/MIRALAX) powder Take 17 g by mouth daily.   06/23/2018 at Unknown time  . Probiotic Product (ULTRAFLORA IMMUNE HEALTH PO) Take 1 capsule by mouth daily.   06/23/2018 at Unknown time  . rosuvastatin (CRESTOR) 40 MG tablet Take 1 tablet (40 mg total) by mouth daily. 90 tablet 1 06/23/2018 at Unknown time  . topiramate (TOPAMAX) 100 MG tablet Take 1 tablet (100 mg total) by mouth daily. TAKE 1 TABLET BY MOUTH EVERYDAY AT BEDTIME (Patient taking differently: Take 100 mg by mouth at bedtime. ) 90 tablet 1 06/23/2018 at Unknown time  . traZODone (DESYREL) 50 MG tablet Take 50 mg by mouth at bedtime as needed for sleep.   1 06/23/2018 at Unknown time  . TURMERIC PO Take 1 capsule by mouth 2 (two) times daily.   Past Month at Unknown time  . fluticasone (FLONASE) 50 MCG/ACT nasal spray SPRAY 2 SPRAYS INTO EACH NOSTRIL EVERY DAY (Patient taking differently: Place 2 sprays into both nostrils daily as needed for allergies. ) 48 g 0 06/22/2018  . lidocaine (XYLOCAINE) 5 % ointment Apply 1 application topically as needed for mild pain.    06/22/2018  . oxyCODONE (OXY IR/ROXICODONE) 5 MG immediate release tablet Take 1 tablet (5 mg total) by mouth every 4 (four) hours as needed for severe pain. For AFTER surgery, do not take and drive 10 tablet 0 Unknown at Unknown time  . senna-docusate (SENOKOT-S) 8.6-50 MG tablet Take 2 tablets by mouth at bedtime. For AFTER surgery, hold if having loose stools 30 tablet 1 Unknown at Unknown time    Current Facility-Administered Medications  Medication Dose  Route Frequency Provider Last  Rate Last Dose  . bupivacaine liposome (EXPAREL) 1.3 % injection 266 mg  20 mL Infiltration Once Michael Boston, MD      . ceFAZolin (ANCEF) IVPB 2g/100 mL premix  2 g Intravenous On Call to OR Michael Boston, MD       And  . metroNIDAZOLE (FLAGYL) IVPB 500 mg  500 mg Intravenous On Call to OR Michael Boston, MD      . Chlorhexidine Gluconate Cloth 2 % PADS 6 each  6 each Topical Once Michael Boston, MD       And  . Chlorhexidine Gluconate Cloth 2 % PADS 6 each  6 each Topical Once Michael Boston, MD      . lactated ringers infusion   Intravenous Continuous Janeece Riggers, MD 50 mL/hr at 06/24/18 1159       Allergies  Allergen Reactions  . Baclofen Diarrhea  . Neurontin [Gabapentin] Other (See Comments)    Somnolence    BP 119/60   Pulse 63   Temp 98.7 F (37.1 C) (Oral)   Resp 16   Ht 5' 4.75" (1.645 m)   Wt 60.1 kg   SpO2 100%   BMI 22.22 kg/m   Labs: No results found for this or any previous visit (from the past 48 hour(s)).  Imaging / Studies: No results found.   Adin Hector, M.D., F.A.C.S. Gastrointestinal and Minimally Invasive Surgery [facility] [address] [place], [place] [address] [phone] Main / Paging  06/24/2018 1:07 PM    Adin Hector

## 2018-06-24 NOTE — Anesthesia Postprocedure Evaluation (Signed)
Anesthesia Post Note  Patient: Rebecca Gordon  Procedure(s) Performed: ANAL SPHINCTEROTOMY, ANORECTAL EXAM UNDER ANESTHESIA (N/A Rectum) HEMORRHOIDECTOMY (N/A Rectum)     Patient location during evaluation: PACU Anesthesia Type: General Level of consciousness: awake and alert Pain management: pain level controlled Vital Signs Assessment: post-procedure vital signs reviewed and stable Respiratory status: spontaneous breathing, nonlabored ventilation, respiratory function stable and patient connected to nasal cannula oxygen Cardiovascular status: blood pressure returned to baseline and stable Postop Assessment: no apparent nausea or vomiting Anesthetic complications: no    Last Vitals:  Vitals:   06/24/18 1437 06/24/18 1445  BP: (!) 125/47 (!) 121/58  Pulse: 89 85  Resp: 16 15  Temp: 36.5 C   SpO2: 99% 100%    Last Pain:  Vitals:   06/24/18 1437  TempSrc:   PainSc: 0-No pain                 Debi Cousin S

## 2018-06-24 NOTE — Discharge Instructions (Signed)
°  Post Anesthesia Home Care Instructions  Activity: Get plenty of rest for the remainder of the day. A responsible adult should stay with you for 24 hours following the procedure.  For the next 24 hours, DO NOT: -Drive a car -Paediatric nurse -Drink alcoholic beverages -Take any medication unless instructed by your physician -Make any legal decisions or sign important papers.  Meals: Start with liquid foods such as gelatin or soup. Progress to regular foods as tolerated. Avoid greasy, spicy, heavy foods. If nausea and/or vomiting occur, drink only clear liquids until the nausea and/or vomiting subsides. Call your physician if vomiting continues.  Special Instructions/Symptoms: Your throat may feel dry or sore from the anesthesia or the breathing tube placed in your throat during surgery. If this causes discomfort, gargle with warm salt water. The discomfort should disappear within 24 hours.  If you had a scopolamine patch placed behind your ear for the management of post- operative nausea and/or vomiting:  1. The medication in the patch is effective for 72 hours, after which it should be removed.  Wrap patch in a tissue and discard in the trash. Wash hands thoroughly with soap and water. 2. You may remove the patch earlier than 72 hours if you experience unpleasant side effects which may include dry mouth, dizziness or visual disturbances. 3. Avoid touching the patch. Wash your hands with soap and water after contact with the patch.   Information for Discharge Teaching: EXPAREL (bupivacaine liposome injectable suspension)   Your surgeon gave you EXPAREL(bupivacaine) in your surgical incision to help control your pain after surgery.   EXPAREL is a local anesthetic that provides pain relief by numbing the tissue around the surgical site.  EXPAREL is designed to release pain medication over time and can control pain for up to 72 hours.  Depending on how you respond to EXPAREL, you may  require less pain medication during your recovery.  Possible side effects:  Temporary loss of sensation or ability to move in the area where bupivacaine was injected.  Nausea, vomiting, constipation  Rarely, numbness and tingling in your mouth or lips, lightheadedness, or anxiety may occur.  Call your doctor right away if you think you may be experiencing any of these sensations, or if you have other questions regarding possible side effects.  Follow all other discharge instructions given to you by your surgeon or nurse. Eat a healthy diet and drink plenty of water or other fluids.  If you return to the hospital for any reason within 96 hours following the administration of EXPAREL, please inform your health care providers.   NO ADVIL, ALEVE, MOTRIN UNTIL 5PM TODAY

## 2018-06-24 NOTE — Anesthesia Procedure Notes (Addendum)
Procedure Name: Intubation Date/Time: 06/24/2018 1:26 PM Performed by: Bonney Aid, CRNA Pre-anesthesia Checklist: Patient identified, Emergency Drugs available, Suction available and Patient being monitored Patient Re-evaluated:Patient Re-evaluated prior to induction Oxygen Delivery Method: Circle system utilized Preoxygenation: Pre-oxygenation with 100% oxygen Induction Type: IV induction Ventilation: Mask ventilation without difficulty Laryngoscope Size: Mac and 3 Grade View: Grade I Tube type: Oral Tube size: 7.0 mm Number of attempts: 1 Airway Equipment and Method: Stylet and Oral airway Placement Confirmation: ETT inserted through vocal cords under direct vision,  positive ETCO2 and breath sounds checked- equal and bilateral Secured at: 21 cm Tube secured with: Tape Dental Injury: Teeth and Oropharynx as per pre-operative assessment

## 2018-06-25 ENCOUNTER — Encounter (HOSPITAL_BASED_OUTPATIENT_CLINIC_OR_DEPARTMENT_OTHER): Payer: Self-pay | Admitting: Surgery

## 2018-06-26 ENCOUNTER — Encounter (HOSPITAL_COMMUNITY): Payer: Self-pay

## 2018-06-26 NOTE — Pre-Procedure Instructions (Signed)
EKG 05/26/2018 in epic

## 2018-06-26 NOTE — Patient Instructions (Addendum)
Your procedure is scheduled on: Tuesday, July 08, 2018   Surgery Time:  7:29AM-9:29AM   Report to [facility] Main  Entrance   Report to Short Stay at 5:30 AM   Call this number if you have problems the morning of surgery [phone]   Eat a light diet the day before surgery. Examples including soups, broths, toast, yogurt, mashed potatoes. Things to avoid include carbonated beverages (fizzy beverages), raw fruits and raw vegetables, or beans.    If your bowels are filled with gas, your surgeon will have difficulty visualizing your pelvic organs which increases your surgical risks.   Only clear liquids after midnight until 4:30AM day of surgery   CLEAR LIQUID DIET  Foods Allowed                                                                     Foods Excluded  Water, Black Coffee and tea, regular and decaf                             liquids that you cannot  Plain Jell-O in any flavor                                             see through such as: Fruit ices (not with fruit pulp)                                     milk, soups, orange juice  Iced Popsicles                                    All solid food                                    Cranberry, grape and apple juices Sports drinks like Gatorade Lightly seasoned clear broth or consume(fat free) Sugar, honey syrup  Sample Menu Breakfast                                Lunch                                     Supper Cranberry juice                    Beef broth                            Chicken broth Jell-O                                     Grape juice  Apple juice Coffee or tea                        Jell-O                                      Popsicle                                                Coffee or tea                        Coffee or tea    Complete one Ensure drink the morning of surgery at 4:30AM the day of surgery.   Brush your teeth the morning of  surgery.   Do NOT smoke after Midnight   Take these medicines the morning of surgery with A SIP OF WATER: Amlodipine, Pristiq, Megestrol, Metoprolol, Omeprazole   May use Flonase if needed                               You may not have any metal on your body including hair pins, jewelry, and body piercings             Do not wear make-up, lotions, powders, perfumes/cologne, or deodorant             Do not wear nail polish.  Do not shave  48 hours prior to surgery.               Do not bring valuables to the hospital. [place].   Contacts, dentures or bridgework may not be worn into surgery.   Leave suitcase in the car. After surgery it may be brought to your room.    Patients discharged the day of surgery will not be allowed to drive home.   Special Instructions: Bring a copy of your healthcare power of attorney and living will documents         the day of surgery if you haven't scanned them in before.              Please read over the following fact sheets you were given:  [facility] - Preparing for Surgery Before surgery, you can play an important role.  Because skin is not sterile, your skin needs to be as free of germs as possible.  You can reduce the number of germs on your skin by washing with CHG (chlorahexidine gluconate) soap before surgery.  CHG is an antiseptic cleaner which kills germs and bonds with the skin to continue killing germs even after washing. Please DO NOT use if you have an allergy to CHG or antibacterial soaps.  If your skin becomes reddened/irritated stop using the CHG and inform your nurse when you arrive at Short Stay. Do not shave (including legs and underarms) for at least 48 hours prior to the first CHG shower.  You may shave your face/neck.  Please follow these instructions carefully:  1.  Shower with CHG Soap the night before surgery and the  morning of surgery.  2.  If  you choose to wash your hair,  wash your hair first as usual with your normal  shampoo.  3.  After you shampoo, rinse your hair and body thoroughly to remove the shampoo.                             4.  Use CHG as you would any other liquid soap.  You can apply chg directly to the skin and wash.  Gently with a scrungie or clean washcloth.  5.  Apply the CHG Soap to your body ONLY FROM THE NECK DOWN.   Do   not use on face/ open                           Wound or open sores. Avoid contact with eyes, ears mouth and   genitals (private parts).                       Wash face,  Genitals (private parts) with your normal soap.             6.  Wash thoroughly, paying special attention to the area where your    surgery  will be performed.  7.  Thoroughly rinse your body with warm water from the neck down.  8.  DO NOT shower/wash with your normal soap after using and rinsing off the CHG Soap.                9.  Pat yourself dry with a clean towel.            10.  Wear clean pajamas.            11.  Place clean sheets on your bed the night of your first shower and do not  sleep with pets. Day of Surgery : Do not apply any lotions/deodorants the morning of surgery.  Please wear clean clothes to the hospital/surgery center.  FAILURE TO FOLLOW THESE INSTRUCTIONS MAY RESULT IN THE CANCELLATION OF YOUR SURGERY  PATIENT SIGNATURE_________________________________  NURSE SIGNATURE__________________________________  ________________________________________________________________________   Rebecca Gordon  An incentive spirometer is a tool that can help keep your lungs clear and active. This tool measures how well you are filling your lungs with each breath. Taking long deep breaths may help reverse or decrease the chance of developing breathing (pulmonary) problems (especially infection) following:  A long period of time when you are unable to move or be active. BEFORE THE PROCEDURE   If the spirometer includes an indicator to  show your best effort, your nurse or respiratory therapist will set it to a desired goal.  If possible, sit up straight or lean slightly forward. Try not to slouch.  Hold the incentive spirometer in an upright position. INSTRUCTIONS FOR USE  1. Sit on the edge of your bed if possible, or sit up as far as you can in bed or on a chair. 2. Hold the incentive spirometer in an upright position. 3. Breathe out normally. 4. Place the mouthpiece in your mouth and seal your lips tightly around it. 5. Breathe in slowly and as deeply as possible, raising the piston or the ball toward the top of the column. 6. Hold your breath for 3-5 seconds or for as long as possible. Allow the piston or ball to fall to the bottom of the column. 7. Remove the mouthpiece from your mouth and  breathe out normally. 8. Rest for a few seconds and repeat Steps 1 through 7 at least 10 times every 1-2 hours when you are awake. Take your time and take a few normal breaths between deep breaths. 9. The spirometer may include an indicator to show your best effort. Use the indicator as a goal to work toward during each repetition. 10. After each set of 10 deep breaths, practice coughing to be sure your lungs are clear. If you have an incision (the cut made at the time of surgery), support your incision when coughing by placing a pillow or rolled up towels firmly against it. Once you are able to get out of bed, walk around indoors and cough well. You may stop using the incentive spirometer when instructed by your caregiver.  RISKS AND COMPLICATIONS  Take your time so you do not get dizzy or light-headed.  If you are in pain, you may need to take or ask for pain medication before doing incentive spirometry. It is harder to take a deep breath if you are having pain. AFTER USE  Rest and breathe slowly and easily.  It can be helpful to keep track of a log of your progress. Your caregiver can provide you with a simple table to help with  this. If you are using the spirometer at home, follow these instructions: [place] IF:   You are having difficultly using the spirometer.  You have trouble using the spirometer as often as instructed.  Your pain medication is not giving enough relief while using the spirometer.  You develop fever of 100.5 F (38.1 C) or higher. SEEK IMMEDIATE MEDICAL CARE IF:   You cough up bloody sputum that had not been present before.  You develop fever of 102 F (38.9 C) or greater.  You develop worsening pain at or near the incision site. MAKE SURE YOU:   Understand these instructions.  Will watch your condition.  Will get help right away if you are not doing well or get worse. Document Released: 08/13/2006 Document Revised: 06/25/2011 Document Reviewed: 10/14/2006 Exit[employer] Patient Information 2014 [employer], [place].   ________________________________________________________________________  WHAT IS A BLOOD TRANSFUSION? Blood Transfusion Information  A transfusion is the replacement of blood or some of its parts. Blood is made up of multiple cells which provide different functions.  Red blood cells carry oxygen and are used for blood loss replacement.  White blood cells fight against infection.  Platelets control bleeding.  Plasma helps clot blood.  Other blood products are available for specialized needs, such as hemophilia or other clotting disorders. BEFORE THE TRANSFUSION  Who gives blood for transfusions?   Healthy volunteers who are fully evaluated to make sure their blood is safe. This is blood bank blood. Transfusion therapy is the safest it has ever been in the practice of medicine. Before blood is taken from a donor, a complete history is taken to make sure that person has no history of diseases nor engages in risky social behavior (examples are intravenous drug use or sexual activity with multiple partners). The donor's travel history is screened to minimize  risk of transmitting infections, such as malaria. The donated blood is tested for signs of infectious diseases, such as HIV and hepatitis. The blood is then tested to be sure it is compatible with you in order to minimize the chance of a transfusion reaction. If you or a relative donates blood, this is often done in anticipation of surgery and is not appropriate for  emergency situations. It takes many days to process the donated blood. RISKS AND COMPLICATIONS Although transfusion therapy is very safe and saves many lives, the main dangers of transfusion include:   Getting an infectious disease.  Developing a transfusion reaction. This is an allergic reaction to something in the blood you were given. Every precaution is taken to prevent this. The decision to have a blood transfusion has been considered carefully by your caregiver before blood is given. Blood is not given unless the benefits outweigh the risks. AFTER THE TRANSFUSION  Right after receiving a blood transfusion, you will usually feel much better and more energetic. This is especially true if your red blood cells have gotten low (anemic). The transfusion raises the level of the red blood cells which carry oxygen, and this usually causes an energy increase.  The nurse administering the transfusion will monitor you carefully for complications. HOME CARE INSTRUCTIONS  No special instructions are needed after a transfusion. You may find your energy is better. Speak with your caregiver about any limitations on activity for underlying diseases you may have. SEEK MEDICAL CARE IF:   Your condition is not improving after your transfusion.  You develop redness or irritation at the intravenous (IV) site. SEEK IMMEDIATE MEDICAL CARE IF:  Any of the following symptoms occur over the next 12 hours:  Shaking chills.  You have a temperature by mouth above 102 F (38.9 C), not controlled by medicine.  Chest, back, or muscle pain.  People  around you feel you are not acting correctly or are confused.  Shortness of breath or difficulty breathing.  Dizziness and fainting.  You get a rash or develop hives.  You have a decrease in urine output.  Your urine turns a dark color or changes to pink, red, or brown. Any of the following symptoms occur over the next 10 days:  You have a temperature by mouth above 102 F (38.9 C), not controlled by medicine.  Shortness of breath.  Weakness after normal activity.  The white part of the eye turns yellow (jaundice).  You have a decrease in the amount of urine or are urinating less often.  Your urine turns a dark color or changes to pink, red, or brown. Document Released: 03/30/2000 Document Revised: 06/25/2011 Document Reviewed: 11/17/2007 [facility] Patient Information 2014 [place], [place].  _______________________________________________________________________

## 2018-06-27 ENCOUNTER — Other Ambulatory Visit: Payer: Self-pay

## 2018-06-27 ENCOUNTER — Encounter (HOSPITAL_COMMUNITY): Payer: Self-pay

## 2018-06-27 ENCOUNTER — Encounter (HOSPITAL_COMMUNITY)
Admission: RE | Admit: 2018-06-27 | Discharge: 2018-06-27 | Disposition: A | Discharge status: Home / Self Care | Payer: BLUE CROSS/BLUE SHIELD | Source: Ambulatory Visit | Attending: Gynecologic Oncology | Admitting: Gynecologic Oncology

## 2018-06-27 DIAGNOSIS — Z01812 Encounter for preprocedural laboratory examination: Secondary | ICD-10-CM | POA: Insufficient documentation

## 2018-06-27 HISTORY — DX: Malignant neoplasm of endometrium: C54.1

## 2018-06-27 LAB — CBC
HCT: 44.4 % (ref 36.0–46.0)
Hemoglobin: 14.3 g/dL (ref 12.0–15.0)
MCH: 30.6 pg (ref 26.0–34.0)
MCHC: 32.2 g/dL (ref 30.0–36.0)
MCV: 94.9 fL (ref 80.0–100.0)
Platelets: 267 10*3/uL (ref 150–400)
RBC: 4.68 MIL/uL (ref 3.87–5.11)
RDW: 13.3 % (ref 11.5–15.5)
WBC: 12.4 10*3/uL — ABNORMAL HIGH (ref 4.0–10.5)
nRBC: 0 % (ref 0.0–0.2)

## 2018-06-27 LAB — COMPREHENSIVE METABOLIC PANEL
ALT: 17 U/L (ref 0–44)
AST: 13 U/L — ABNORMAL LOW (ref 15–41)
Albumin: 4.6 g/dL (ref 3.5–5.0)
Alkaline Phosphatase: 56 U/L (ref 38–126)
Anion gap: 7 (ref 5–15)
BUN: 17 mg/dL (ref 8–23)
CO2: 24 mmol/L (ref 22–32)
Calcium: 9.4 mg/dL (ref 8.9–10.3)
Chloride: 108 mmol/L (ref 98–111)
Creatinine, Ser: 0.66 mg/dL (ref 0.44–1.00)
GFR calc Af Amer: >60 mL/min (ref >60)
GFR calc non Af Amer: >60 mL/min (ref >60)
Glucose, Bld: 109 mg/dL — ABNORMAL HIGH (ref 70–99)
Potassium: 3.8 mmol/L (ref 3.5–5.1)
Sodium: 139 mmol/L (ref 135–145)
Total Bilirubin: 0.7 mg/dL (ref 0.3–1.2)
Total Protein: 7.4 g/dL (ref 6.5–8.1)

## 2018-06-27 LAB — URINALYSIS, ROUTINE W REFLEX MICROSCOPIC
Bilirubin Urine: NEGATIVE
Glucose, UA: NEGATIVE mg/dL
Hgb urine dipstick: NEGATIVE
Ketones, ur: NEGATIVE mg/dL
NITRITE: NEGATIVE
PH: 6 (ref 5.0–8.0)
Protein, ur: NEGATIVE mg/dL
Specific Gravity, Urine: 1.015 (ref 1.005–1.030)

## 2018-06-27 LAB — PROTIME-INR
INR: 1 (ref 0.8–1.2)
PROTHROMBIN TIME: 12.7 s (ref 11.4–15.2)

## 2018-06-27 LAB — URINALYSIS, MICROSCOPIC (REFLEX)

## 2018-06-27 LAB — APTT: aPTT: 26 seconds (ref 24–36)

## 2018-06-27 NOTE — Pre-Procedure Instructions (Signed)
CBC, CMP, UA results 06/27/2018 sent to dr. Denman George and Jerilynn Mages. Cross NP via epic.

## 2018-06-27 NOTE — Pre-Procedure Instructions (Signed)
Chart sent to [facility].A. to review history of aneursym 4 mm, TIA's.

## 2018-07-01 ENCOUNTER — Telehealth: Payer: Self-pay | Admitting: *Deleted

## 2018-07-01 NOTE — Telephone Encounter (Signed)
Called the patient and explained that per [facility] APP she will have surgery next week.

## 2018-07-01 NOTE — Telephone Encounter (Signed)
Patient called "I wanted to call and check to see if I will still have my surgery next Tuesday due to the coronavirus? I have not been sick (fever/cough/sob) or traveled. I haven't been around anyone sick or who has traveled either." Explained that I would give the message to [facility] APP and call her back.

## 2018-07-02 ENCOUNTER — Other Ambulatory Visit: Payer: Self-pay | Admitting: Gynecologic Oncology

## 2018-07-02 DIAGNOSIS — C541 Malignant neoplasm of endometrium: Secondary | ICD-10-CM

## 2018-07-02 MED ORDER — MEGESTROL ACETATE 40 MG PO TABS: 40.0000 mg | ORAL_TABLET | Freq: Two times a day (BID) | ORAL | 1 refills | Status: DC

## 2018-07-02 NOTE — Progress Notes (Signed)
Called and informed patient that we would need to be placing her surgery on hold at this time.  Advised she would be updated when additional information was available and when approval for rescheduling cases had been received. Advised to call for any question or concerns. Megace refilled.

## 2018-07-07 ENCOUNTER — Telehealth: Payer: Self-pay | Admitting: Gynecologic Oncology

## 2018-07-07 NOTE — Telephone Encounter (Signed)
Returned call to patient. All questions answered around when her surgery would be rescheduled.  Advised she would be contacted when we were cleared to reschedule her surgery.

## 2018-07-08 ENCOUNTER — Ambulatory Visit (HOSPITAL_COMMUNITY)
Admission: RE | Admit: 2018-07-08 | Payer: BLUE CROSS/BLUE SHIELD | Source: Home / Self Care | Admitting: Gynecologic Oncology

## 2018-07-08 ENCOUNTER — Telehealth: Payer: Self-pay | Admitting: Gynecologic Oncology

## 2018-07-08 ENCOUNTER — Encounter (HOSPITAL_COMMUNITY): Admission: RE | Payer: Self-pay | Source: Home / Self Care

## 2018-07-08 SURGERY — HYSTERECTOMY, TOTAL, ROBOT-ASSISTED, LAPAROSCOPIC, WITH BILATERAL SALPINGO-OOPHORECTOMY
Anesthesia: General

## 2018-07-08 NOTE — Progress Notes (Signed)
Called patient for review of preop instructions since surgery was scheduled for 07/08/2018 then delayed until 07/10/2018.  Patient denies any cough, fever, runny nose, sore throat, difficulty breathing/shortness of breath and neither she nor other family members have traveled outside the state in the last 14 days. Reviewed preop instructions with patient .  Patient aware to arrive at 0900am on 07/10/18 in Admitting.  May have clear liquids from 12 midnite until 0800am morning of surgery then npo.  Reviewed clear liquids with patient .  Patient still has Ensure drink and is aware to drink at between 0750am and 0800am.  Reviewed hibiclens shower instructions with paitent.  Patient still has hibiclens soap. Reviewed eat a light diet the day before surgery.  Patient has phone number for any further questions of [phone].  Patient voiced understanding.

## 2018-07-08 NOTE — Telephone Encounter (Signed)
Called and offered OR day Thursday 07/10/18.  Patient accepted

## 2018-07-09 NOTE — Progress Notes (Signed)
Patient called to ask if she should do a warm water soak for rectal surgery in the am before surgery  that she has been doing since rectal surgery every morning since surgery.  Informed patient that I would speak with Audria Nine regarding this issue and give her a return phone call. Spoke with PA and PA stated that patient should do warm water soak before she starts hibiclens shower tonite or to not do at all.  Called patient back and informed of above and she voiced understanding.  Patient stated she had been drinking clear broth all day and informed patient that she could eat a light diet the day before surgery. Patient found her printed instructions regarding this .  Informed patient that she could also have scrambled egg as part of the light diet.  Patient voiced understanding. Nurse informed patient that she had asked Melissa Cross,NP at OB/GYN office regarding this months ago  And that scrambled egg day before was ok.

## 2018-07-10 ENCOUNTER — Ambulatory Visit (HOSPITAL_COMMUNITY)
Admission: RE | Admit: 2018-07-10 | Discharge: 2018-07-10 | Disposition: A | Discharge status: Home / Self Care | Payer: BLUE CROSS/BLUE SHIELD | Attending: Gynecologic Oncology | Admitting: Gynecologic Oncology

## 2018-07-10 ENCOUNTER — Ambulatory Visit (HOSPITAL_COMMUNITY): Payer: BLUE CROSS/BLUE SHIELD | Admitting: Physician Assistant

## 2018-07-10 ENCOUNTER — Encounter (HOSPITAL_COMMUNITY)
Admission: RE | Disposition: A | Discharge status: Home / Self Care | Payer: Self-pay | Source: Home / Self Care | Attending: Gynecologic Oncology

## 2018-07-10 ENCOUNTER — Encounter (HOSPITAL_COMMUNITY): Payer: Self-pay | Admitting: Certified Registered Nurse Anesthetist

## 2018-07-10 DIAGNOSIS — C541 Malignant neoplasm of endometrium: Secondary | ICD-10-CM | POA: Diagnosis not present

## 2018-07-10 DIAGNOSIS — F329 Major depressive disorder, single episode, unspecified: Secondary | ICD-10-CM | POA: Diagnosis not present

## 2018-07-10 DIAGNOSIS — Z8673 Personal history of transient ischemic attack (TIA), and cerebral infarction without residual deficits: Secondary | ICD-10-CM | POA: Insufficient documentation

## 2018-07-10 DIAGNOSIS — F17210 Nicotine dependence, cigarettes, uncomplicated: Secondary | ICD-10-CM | POA: Diagnosis not present

## 2018-07-10 DIAGNOSIS — E7800 Pure hypercholesterolemia, unspecified: Secondary | ICD-10-CM | POA: Insufficient documentation

## 2018-07-10 DIAGNOSIS — Z79899 Other long term (current) drug therapy: Secondary | ICD-10-CM | POA: Insufficient documentation

## 2018-07-10 DIAGNOSIS — N83291 Other ovarian cyst, right side: Secondary | ICD-10-CM | POA: Diagnosis not present

## 2018-07-10 DIAGNOSIS — K219 Gastro-esophageal reflux disease without esophagitis: Secondary | ICD-10-CM | POA: Insufficient documentation

## 2018-07-10 DIAGNOSIS — D259 Leiomyoma of uterus, unspecified: Secondary | ICD-10-CM | POA: Diagnosis not present

## 2018-07-10 DIAGNOSIS — I10 Essential (primary) hypertension: Secondary | ICD-10-CM | POA: Diagnosis not present

## 2018-07-10 DIAGNOSIS — Z888 Allergy status to other drugs, medicaments and biological substances status: Secondary | ICD-10-CM | POA: Insufficient documentation

## 2018-07-10 DIAGNOSIS — N838 Other noninflammatory disorders of ovary, fallopian tube and broad ligament: Secondary | ICD-10-CM | POA: Diagnosis not present

## 2018-07-10 DIAGNOSIS — Z7983 Long term (current) use of bisphosphonates: Secondary | ICD-10-CM | POA: Diagnosis not present

## 2018-07-10 DIAGNOSIS — M1711 Unilateral primary osteoarthritis, right knee: Secondary | ICD-10-CM | POA: Diagnosis not present

## 2018-07-10 DIAGNOSIS — M810 Age-related osteoporosis without current pathological fracture: Secondary | ICD-10-CM | POA: Insufficient documentation

## 2018-07-10 DIAGNOSIS — N83292 Other ovarian cyst, left side: Secondary | ICD-10-CM | POA: Diagnosis not present

## 2018-07-10 DIAGNOSIS — J449 Chronic obstructive pulmonary disease, unspecified: Secondary | ICD-10-CM | POA: Diagnosis not present

## 2018-07-10 HISTORY — PX: SENTINEL NODE BIOPSY: SHX6608

## 2018-07-10 HISTORY — PX: ROBOTIC ASSISTED TOTAL HYSTERECTOMY WITH BILATERAL SALPINGO OOPHERECTOMY: SHX6086

## 2018-07-10 LAB — CBC
HCT: 40.2 % (ref 36.0–46.0)
Hemoglobin: 13.4 g/dL (ref 12.0–15.0)
MCH: 31.1 pg (ref 26.0–34.0)
MCHC: 33.3 g/dL (ref 30.0–36.0)
MCV: 93.3 fL (ref 80.0–100.0)
NRBC: 0 % (ref 0.0–0.2)
Platelets: 302 10*3/uL (ref 150–400)
RBC: 4.31 MIL/uL (ref 3.87–5.11)
RDW: 13.5 % (ref 11.5–15.5)
WBC: 11.8 10*3/uL — ABNORMAL HIGH (ref 4.0–10.5)

## 2018-07-10 LAB — TYPE AND SCREEN
ABO/RH(D): A POS
ABO/RH(D): A POS
ANTIBODY SCREEN: NEGATIVE
Antibody Screen: NEGATIVE

## 2018-07-10 SURGERY — HYSTERECTOMY, TOTAL, ROBOT-ASSISTED, LAPAROSCOPIC, WITH BILATERAL SALPINGO-OOPHORECTOMY
Anesthesia: General

## 2018-07-10 MED ORDER — LACTATED RINGERS IV SOLN
INTRAVENOUS | Status: DC
  Administered 2018-07-10 (×2): via INTRAVENOUS

## 2018-07-10 MED ORDER — ONDANSETRON HCL 4 MG/2ML IJ SOLN
INTRAMUSCULAR | Status: DC | PRN
  Administered 2018-07-10: 4 mg via INTRAVENOUS

## 2018-07-10 MED ORDER — FENTANYL CITRATE (PF) 250 MCG/5ML IJ SOLN
INTRAMUSCULAR | Status: DC | PRN
  Administered 2018-07-10: 50 ug via INTRAVENOUS
  Administered 2018-07-10: 100 ug via INTRAVENOUS
  Administered 2018-07-10 (×2): 50 ug via INTRAVENOUS

## 2018-07-10 MED ORDER — ACETAMINOPHEN 500 MG PO TABS
1000.0000 mg | ORAL_TABLET | ORAL | Status: AC
  Administered 2018-07-10: 1000 mg via ORAL
  Filled 2018-07-10: qty 2

## 2018-07-10 MED ORDER — STERILE WATER FOR INJECTION IJ SOLN
INTRAMUSCULAR | Status: DC | PRN
  Administered 2018-07-10: 10 mL

## 2018-07-10 MED ORDER — CEFAZOLIN SODIUM-DEXTROSE 2-4 GM/100ML-% IV SOLN
2.0000 g | INTRAVENOUS | Status: AC
  Administered 2018-07-10: 2 g via INTRAVENOUS
  Filled 2018-07-10: qty 100

## 2018-07-10 MED ORDER — LIDOCAINE 2% (20 MG/ML) 5 ML SYRINGE
INTRAMUSCULAR | Status: DC | PRN
  Administered 2018-07-10: 1.5 mg/kg/h via INTRAVENOUS

## 2018-07-10 MED ORDER — ENOXAPARIN SODIUM 40 MG/0.4ML ~~LOC~~ SOLN
40.0000 mg | SUBCUTANEOUS | Status: AC
  Administered 2018-07-10: 40 mg via SUBCUTANEOUS
  Filled 2018-07-10: qty 0.4

## 2018-07-10 MED ORDER — SODIUM CHLORIDE 0.9% FLUSH: 3.0000 mL | Freq: Two times a day (BID) | INTRAVENOUS | Status: DC

## 2018-07-10 MED ORDER — BUPIVACAINE HCL (PF) 0.25 % IJ SOLN
INTRAMUSCULAR | Status: AC
  Filled 2018-07-10: qty 30

## 2018-07-10 MED ORDER — PROPOFOL 10 MG/ML IV BOLUS
INTRAVENOUS | Status: DC | PRN
  Administered 2018-07-10: 150 mg via INTRAVENOUS

## 2018-07-10 MED ORDER — SODIUM CHLORIDE 0.9 % IV SOLN: 250.0000 mL | INTRAVENOUS | Status: DC | PRN

## 2018-07-10 MED ORDER — EPHEDRINE SULFATE-NACL 50-0.9 MG/10ML-% IV SOSY
PREFILLED_SYRINGE | INTRAVENOUS | Status: DC | PRN
  Administered 2018-07-10: 5 mg via INTRAVENOUS
  Administered 2018-07-10: 10 mg via INTRAVENOUS

## 2018-07-10 MED ORDER — OXYCODONE HCL 5 MG PO TABS
5.0000 mg | ORAL_TABLET | ORAL | Status: DC | PRN
  Administered 2018-07-10: 5 mg via ORAL

## 2018-07-10 MED ORDER — OXYCODONE HCL 5 MG PO TABS
ORAL_TABLET | ORAL | Status: AC
  Administered 2018-07-10: 5 mg via ORAL
  Filled 2018-07-10: qty 1

## 2018-07-10 MED ORDER — SCOPOLAMINE 1 MG/3DAYS TD PT72
1.0000 | MEDICATED_PATCH | TRANSDERMAL | Status: DC
  Administered 2018-07-10: 1.5 mg via TRANSDERMAL
  Filled 2018-07-10: qty 1

## 2018-07-10 MED ORDER — PHENYLEPHRINE 40 MCG/ML (10ML) SYRINGE FOR IV PUSH (FOR BLOOD PRESSURE SUPPORT)
PREFILLED_SYRINGE | INTRAVENOUS | Status: DC | PRN
  Administered 2018-07-10: 80 ug via INTRAVENOUS

## 2018-07-10 MED ORDER — CELECOXIB 200 MG PO CAPS
400.0000 mg | ORAL_CAPSULE | ORAL | Status: AC
  Administered 2018-07-10: 400 mg via ORAL
  Filled 2018-07-10: qty 2

## 2018-07-10 MED ORDER — BUPIVACAINE HCL 0.25 % IJ SOLN
INTRAMUSCULAR | Status: DC | PRN
  Administered 2018-07-10: 16 mL

## 2018-07-10 MED ORDER — LIDOCAINE 2% (20 MG/ML) 5 ML SYRINGE
INTRAMUSCULAR | Status: DC | PRN
  Administered 2018-07-10: 80 mg via INTRAVENOUS

## 2018-07-10 MED ORDER — LACTATED RINGERS IR SOLN
Status: DC | PRN
  Administered 2018-07-10: 1000 mL

## 2018-07-10 MED ORDER — MIDAZOLAM HCL 5 MG/5ML IJ SOLN
INTRAMUSCULAR | Status: DC | PRN
  Administered 2018-07-10: 2 mg via INTRAVENOUS

## 2018-07-10 MED ORDER — MORPHINE SULFATE (PF) 4 MG/ML IV SOLN: 2.0000 mg | INTRAVENOUS | Status: DC | PRN

## 2018-07-10 MED ORDER — SODIUM CHLORIDE 0.9% FLUSH: 3.0000 mL | INTRAVENOUS | Status: DC | PRN

## 2018-07-10 MED ORDER — STERILE WATER FOR IRRIGATION IR SOLN
Status: DC | PRN
  Administered 2018-07-10: 1000 mL

## 2018-07-10 MED ORDER — DEXAMETHASONE SODIUM PHOSPHATE 4 MG/ML IJ SOLN: 4.0000 mg | INTRAMUSCULAR | Status: DC

## 2018-07-10 MED ORDER — ROCURONIUM BROMIDE 10 MG/ML (PF) SYRINGE
PREFILLED_SYRINGE | INTRAVENOUS | Status: DC | PRN
  Administered 2018-07-10: 50 mg via INTRAVENOUS
  Administered 2018-07-10: 20 mg via INTRAVENOUS

## 2018-07-10 MED ORDER — SUCCINYLCHOLINE CHLORIDE 200 MG/10ML IV SOSY
PREFILLED_SYRINGE | INTRAVENOUS | Status: DC | PRN
  Administered 2018-07-10: 100 mg via INTRAVENOUS

## 2018-07-10 MED ORDER — KETAMINE HCL 10 MG/ML IJ SOLN
INTRAMUSCULAR | Status: DC | PRN
  Administered 2018-07-10: 30 mg via INTRAVENOUS

## 2018-07-10 MED ORDER — ACETAMINOPHEN 650 MG RE SUPP
650.0000 mg | RECTAL | Status: DC | PRN
  Filled 2018-07-10: qty 1

## 2018-07-10 MED ORDER — ACETAMINOPHEN 325 MG PO TABS: 650.0000 mg | ORAL_TABLET | ORAL | Status: DC | PRN

## 2018-07-10 MED ORDER — SUGAMMADEX SODIUM 200 MG/2ML IV SOLN
INTRAVENOUS | Status: DC | PRN
  Administered 2018-07-10: 300 mg via INTRAVENOUS

## 2018-07-10 SURGICAL SUPPLY — 56 items
ADH SKN CLS APL DERMABOND .7 (GAUZE/BANDAGES/DRESSINGS) ×2
AGENT HMST KT MTR STRL THRMB (HEMOSTASIS)
APL ESCP 34 STRL LF DISP (HEMOSTASIS)
APPLICATOR SURGIFLO ENDO (HEMOSTASIS) IMPLANT
BAG LAPAROSCOPIC 12 15 PORT 16 (BASKET) IMPLANT
BAG RETRIEVAL 12/15 (BASKET)
BAG SPEC RTRVL LRG 6X4 10 (ENDOMECHANICALS)
COVER BACK TABLE 60X90IN (DRAPES) ×3 IMPLANT
COVER TIP SHEARS 8 DVNC (MISCELLANEOUS) ×2 IMPLANT
COVER TIP SHEARS 8MM DA VINCI (MISCELLANEOUS) ×1
COVER WAND RF STERILE (DRAPES) ×1 IMPLANT
DECANTER SPIKE VIAL GLASS SM (MISCELLANEOUS) ×2 IMPLANT
DERMABOND ADVANCED (GAUZE/BANDAGES/DRESSINGS) ×1
DERMABOND ADVANCED .7 DNX12 (GAUZE/BANDAGES/DRESSINGS) ×2 IMPLANT
DRAPE ARM DVNC X/XI (DISPOSABLE) ×8 IMPLANT
DRAPE COLUMN DVNC XI (DISPOSABLE) ×2 IMPLANT
DRAPE DA VINCI XI ARM (DISPOSABLE) ×4
DRAPE DA VINCI XI COLUMN (DISPOSABLE) ×1
DRAPE SHEET LG 3/4 BI-LAMINATE (DRAPES) ×3 IMPLANT
DRAPE SURG IRRIG POUCH 19X23 (DRAPES) ×3 IMPLANT
ELECT REM PT RETURN 15FT ADLT (MISCELLANEOUS) ×3 IMPLANT
GLOVE BIO SURGEON STRL SZ 6 (GLOVE) ×12 IMPLANT
GLOVE BIO SURGEON STRL SZ 6.5 (GLOVE) ×2 IMPLANT
GOWN STRL REUS W/ TWL LRG LVL3 (GOWN DISPOSABLE) ×4 IMPLANT
GOWN STRL REUS W/TWL LRG LVL3 (GOWN DISPOSABLE) ×6
HOLDER FOLEY CATH W/STRAP (MISCELLANEOUS) ×3 IMPLANT
IRRIG SUCT STRYKERFLOW 2 WTIP (MISCELLANEOUS) ×3
IRRIGATION SUCT STRKRFLW 2 WTP (MISCELLANEOUS) ×2 IMPLANT
KIT PROCEDURE DA VINCI SI (MISCELLANEOUS) ×1
KIT PROCEDURE DVNC SI (MISCELLANEOUS) IMPLANT
KIT TURNOVER KIT A (KITS) ×1 IMPLANT
MANIPULATOR UTERINE 4.5 ZUMI (MISCELLANEOUS) ×3 IMPLANT
NDL SPNL 18GX3.5 QUINCKE PK (NEEDLE) IMPLANT
NEEDLE HYPO 22GX1.5 SAFETY (NEEDLE) ×1 IMPLANT
NEEDLE SPNL 18GX3.5 QUINCKE PK (NEEDLE) ×3 IMPLANT
OBTURATOR OPTICAL STANDARD 8MM (TROCAR) ×1
OBTURATOR OPTICAL STND 8 DVNC (TROCAR) ×2
OBTURATOR OPTICALSTD 8 DVNC (TROCAR) ×2 IMPLANT
PACK ROBOT GYN CUSTOM WL (TRAY / TRAY PROCEDURE) ×3 IMPLANT
PAD POSITIONING PINK XL (MISCELLANEOUS) ×3 IMPLANT
PORT ACCESS TROCAR AIRSEAL 12 (TROCAR) ×2 IMPLANT
PORT ACCESS TROCAR AIRSEAL 5M (TROCAR) ×1
POUCH SPECIMEN RETRIEVAL 10MM (ENDOMECHANICALS) IMPLANT
SEAL CANN UNIV 5-8 DVNC XI (MISCELLANEOUS) ×8 IMPLANT
SEAL XI 5MM-8MM UNIVERSAL (MISCELLANEOUS) ×3
SET TRI-LUMEN FLTR TB AIRSEAL (TUBING) ×3 IMPLANT
SURGIFLO W/THROMBIN 8M KIT (HEMOSTASIS) IMPLANT
SUT VIC AB 0 CT1 27 (SUTURE)
SUT VIC AB 0 CT1 27XBRD ANTBC (SUTURE) IMPLANT
SUT VIC AB 4-0 PS2 27 (SUTURE) ×1 IMPLANT
SYR 10ML LL (SYRINGE) IMPLANT
TOWEL OR NON WOVEN STRL DISP B (DISPOSABLE) ×3 IMPLANT
TRAP SPECIMEN MUCOUS 40CC (MISCELLANEOUS) IMPLANT
TRAY FOLEY MTR SLVR 16FR STAT (SET/KITS/TRAYS/PACK) ×3 IMPLANT
UNDERPAD 30X30 (UNDERPADS AND DIAPERS) ×3 IMPLANT
WATER STERILE IRR 1000ML POUR (IV SOLUTION) ×3 IMPLANT

## 2018-07-10 NOTE — Anesthesia Procedure Notes (Signed)
Procedure Name: Intubation Date/Time: 07/10/2018 10:58 AM Performed by: Mitzie Na, CRNA Pre-anesthesia Checklist: Patient identified, Emergency Drugs available, Suction available, Patient being monitored and Timeout performed Patient Re-evaluated:Patient Re-evaluated prior to induction Oxygen Delivery Method: Circle system utilized Preoxygenation: Pre-oxygenation with 100% oxygen Induction Type: IV induction, Rapid sequence and Cricoid Pressure applied Laryngoscope Size: Mac and 3 Grade View: Grade I Tube type: Oral Tube size: 7.0 mm Number of attempts: 1 Airway Equipment and Method: Stylet Placement Confirmation: ETT inserted through vocal cords under direct vision,  positive ETCO2 and breath sounds checked- equal and bilateral Secured at: 24 cm Tube secured with: Tape Dental Injury: Teeth and Oropharynx as per pre-operative assessment

## 2018-07-10 NOTE — H&P (Signed)
Consult Note: Gyn-Onc  Consult was requested by Dr. Dellis Filbert for the evaluation of Rebecca Gordon 63 y.o. female  CC:  No chief complaint on file.   Assessment/Plan:  Rebecca Gordon  is a 63 y.o.  year old with a grade 1 endometrial cancer and a history of TIA.  A detailed discussion was held with the patient and her family with regard to to her endometrial cancer diagnosis. We discussed the standard management options for uterine cancer which includes surgery followed possibly by adjuvant therapy depending on the results of surgery. The options for surgical management include a hysterectomy and removal of the tubes and ovaries possibly with removal of pelvic and para-aortic lymph nodes.If feasible, a minimally invasive approach including a robotic hysterectomy or laparoscopic hysterectomy have benefits including shorter hospital stay, recovery time and better wound healing than with open surgery. The patient has been counseled about these surgical options and the risks of surgery in general including infection, bleeding, damage to surrounding structures (including bowel, bladder, ureters, nerves or vessels), and the postoperative risks of PE/ DVT, and lymphedema. I extensively reviewed the additional risks of robotic hysterectomy including possible need for conversion to open laparotomy.  I discussed positioning during surgery of trendelenberg and risks of minor facial swelling and care we take in preoperative positioning.  After counseling and consideration of her options, she desires to proceed with robotic assisted total hysterectomy with bilateral sapingo-oophorectomy and SLN biopsy.   She has an increased risk for stroke around the time of surgery. I discussed this with the patient. She has an increased risk for bleeding due to her ASA use and tumeric use. I have recommended that she decrease ASA to 81mg  a day preoperatively and in the week postoperatively. I have asked her to discontinue  tumeric until after I see her back for a postoperative check.   She will be seen by anesthesia for preoperative clearance and discussion of postoperative pain management.  She was given the opportunity to ask questions, which were answered to her satisfaction, and she is agreement with the above mentioned plan of care.  Her daughter's wedding is March 21st and she would prefer to delay surgery until after that time if safe. I have prescribed progesterone to take until that time.   She has pain with an anal fissure that she has had since August, 2019. This is associated with poor QOL. At her request I will have her seen by Dr Leighton Ruff.  HPI: Rebecca Gordon is a 63 year old P2 who is seen in consultation at the request of Dr Dellis Filbert for grade 1 endometrial cancer.  The patient reports menopausal bleeding since November 2019.  She had posterior menopause at age 83.  She was seen by Dr. Dellis Filbert who performed a transvaginal ultrasound and a sonohysterogram on May 08, 2018.  This revealed an anteverted uterus measuring 6.9 x 4 x 2.9 cm.  The endometrium was 7 mm with a 1.7 x 7 mm endometrial mass thought to be an endometrial polyp.  She was then taken to the operating room for a D&C procedure and hysteroscopy on May 26, 2018.  Final pathology from that procedure revealed a FIGO grade 1 endometrioid adenocarcinoma.  The patient has a history of a TIA in 2018 treated at [facility] with full work-up.  No cardiac or vascular pathology was identified as part of the work-up.  She was placed on aspirin 325 mg daily following this episode.  Her  only residual symptoms from her TIA are left hand numbness and tingling and some strange feelings in her left upper.  She is had no prior abdominal surgeries.  She has had 2 prior vaginal deliveries.  Her family history is significant for father with brain cancer, skin cancer, and prostate cancer.  There is a lot of colon cancer on that side of the  family.  She reports significant anal pain from an anal fissure diagnosed in August, 2019. No relief with therapies by Dr Collene Mares.   Current Meds:  Outpatient Encounter Medications as of 06/05/2018  Medication Sig  . alendronate (FOSAMAX) 70 MG tablet Take 1 tablet (70 mg total) by mouth every 7 (seven) days. Take with a full glass of water on an empty stomach.  Marland Kitchen amLODipine (NORVASC) 10 MG tablet TAKE 1 TABLET BY MOUTH EVERY DAY (Patient taking differently: every morning. )  . b complex vitamins tablet Take 1 tablet by mouth daily.  . cholecalciferol (VITAMIN D) 1000 units tablet Take 1,000 Units by mouth every morning.   . desvenlafaxine (PRISTIQ) 50 MG 24 hr tablet Take 1 tablet (50 mg total) by mouth daily. (Patient taking differently: Take 50 mg by mouth every morning. )  . diltiazem 2 % GEL Apply 1 application topically 2 (two) times daily.  . fluticasone (FLONASE) 50 MCG/ACT nasal spray SPRAY 2 SPRAYS INTO EACH NOSTRIL EVERY DAY (Patient taking differently: as needed for allergies. )  . hydrochlorothiazide (MICROZIDE) 12.5 MG capsule TAKE 1 CAPSULE BY MOUTH EVERY DAY (Patient taking differently: every morning. )  . lidocaine (XYLOCAINE) 5 % ointment Apply 1 application topically as needed.  Marland Kitchen losartan (COZAAR) 100 MG tablet TAKE 1 TABLET BY MOUTH EVERY DAY (Patient taking differently: every morning. )  . metoprolol succinate (TOPROL-XL) 100 MG 24 hr tablet TAKE 1 TABLET BY MOUTH DAILY. TAKE WITH OR IMMEDIATELY FOLLOWING A MEAL. (Patient taking differently: every morning. )  . Omega 3 1200 MG CAPS Take 1,200 mg by mouth every evening.   Marland Kitchen omeprazole (PRILOSEC) 20 MG capsule Take 20 mg by mouth every morning.   . rosuvastatin (CRESTOR) 40 MG tablet Take 1 tablet (40 mg total) by mouth daily. (Patient taking differently: Take 40 mg by mouth every evening. )  . topiramate (TOPAMAX) 100 MG tablet Take 1 tablet (100 mg total) by mouth daily. TAKE 1 TABLET BY MOUTH EVERYDAY AT BEDTIME  . traZODone  (DESYREL) 50 MG tablet at bedtime as needed.   . TURMERIC PO Take 1 capsule by mouth 2 (two) times daily.  . [DISCONTINUED] aspirin 325 MG tablet Take 1 tablet (325 mg total) by mouth daily.  . megestrol (MEGACE) 40 MG tablet Take 1 tablet (40 mg total) by mouth 2 (two) times daily.   Facility-Administered Encounter Medications as of 06/05/2018  Medication  . aspirin chewable tablet 81 mg    Allergy:  Allergies  Allergen Reactions  . Baclofen Diarrhea  . Neurontin [Gabapentin] Other (See Comments)    Somnolence    Social Hx:   Social History   Socioeconomic History  . Marital status: Married    Spouse name: Not on file  . Number of children: 2  . Years of education: College  . Highest education level: Not on file  Occupational History  . Occupation: Unemployed  Social Needs  . Financial resource strain: Not on file  . Food insecurity:    Worry: Not on file    Inability: Not on file  . Transportation needs:  Medical: Not on file    Non-medical: Not on file  Tobacco Use  . Smoking status: Current Every Day Smoker    Packs/day: 1.00    Years: 40.00    Pack years: 40.00    Types: Cigarettes  . Smokeless tobacco: Never Used  . Tobacco comment: Previous 1 pack a day  Substance and Sexual Activity  . Alcohol use: Not Currently    Comment: daily - 1 bottle of wine--- 09/23/2016, 3-4 glasses--- 10/15/16  . Drug use: No  . Sexual activity: Yes    Birth control/protection: Post-menopausal    Comment: 1st intercourse- 50, partners- 2, married- 37 yrs   Lifestyle  . Physical activity:    Days per week: Not on file    Minutes per session: Not on file  . Stress: Not on file  Relationships  . Social connections:    Talks on phone: Not on file    Gets together: Not on file    Attends religious service: Not on file    Active member of club or organization: Not on file    Attends meetings of clubs or organizations: Not on file    Relationship status: Not on file  .  Intimate partner violence:    Fear of current or ex partner: Not on file    Emotionally abused: Not on file    Physically abused: Not on file    Forced sexual activity: Not on file  Other Topics Concern  . Not on file  Social History Narrative   Lives at home w/ her husband   Right-handed   Caffeine: 2-3 cups per day    Past Surgical Hx:  Past Surgical History:  Procedure Laterality Date  . CLAVICLE SURGERY Right 2016   fracture repair  . COLONOSCOPY  2017   multiples  . DILATATION & CURETTAGE/HYSTEROSCOPY WITH MYOSURE N/A 05/26/2018   Procedure: DILATATION & CURETTAGE/HYSTEROSCOPY WITH MYOSURE;  Surgeon: Princess Bruins, MD;  Location: [facility];  Service: Gynecology;  Laterality: N/A;  . EYE SURGERY Right    "growth on eye removed"  . FOOT SURGERY Right 08/2017   benign mass  . HEMORRHOID SURGERY N/A 06/24/2018   Procedure: HEMORRHOIDECTOMY;  Surgeon: Michael Boston, MD;  Location: [facility];  Service: General;  Laterality: N/A;  . KNEE ARTHROSCOPY Right    Menicus repair  . ORIF CLAVICULAR FRACTURE Right 04/28/2015   Procedure: REVISION ORIF RIGHT CLAVICAL FRACTURE, ALLOGRAFT BONE GRAFTING;  Surgeon: Justice Britain, MD;  Location: [facility];  Service: Orthopedics;  Laterality: Right;  . PLACEMENT OF BREAST IMPLANTS Bilateral   . SPHINCTEROTOMY N/A 06/24/2018   Procedure: ANAL SPHINCTEROTOMY, ANORECTAL EXAM UNDER ANESTHESIA;  Surgeon: Michael Boston, MD;  Location: [facility];  Service: General;  Laterality: N/A;  . TONSILLECTOMY      Past Medical Hx:  Past Medical History:  Diagnosis Date  . Anal fissure   . 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 Anxiety   . Depression   . 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 Endometrial polyp   . Former smoker 09/23/2016  . GERD (gastroesophageal reflux disease)    Nexium  . H/O clavicle fracture 2017   Right  . High cholesterol   . History of  bronchitis   . History of pneumonia    years ago  . Hypertension   . LVH (left ventricular hypertrophy) 09/23/2016   Mild, noted on  ECHO  . Numbness    left fingers after TIA  . OA (osteoarthritis)    "right knee"  . Osteoporosis 08/2017   T score -2.7  . Panic attacks   . Postmenopausal bleeding   . Thyroid nodule 2019   Multiple  . TIA (transient ischemic attack) 09/2016  . Wears contact lenses     Past Gynecological History:  See HPI No LMP recorded. Patient is postmenopausal.  Family Hx:  Family History  Problem Relation Age of Onset  . Heart failure Mother   . Hypertension Father   . Brain cancer Father   . Prostate cancer Father   . Skin cancer Father     Review of Systems:  Constitutional  Feels well,    ENT Normal appearing ears and nares bilaterally Skin/Breast  No rash, sores, jaundice, itching, dryness Cardiovascular  No chest pain, shortness of breath, or edema  Pulmonary  No cough or wheeze.  Gastro Intestinal  No nausea, vomitting, or diarrhoea. No bright red blood per rectum, no abdominal pain, change in bowel movement, or constipation. + anal pain Genito Urinary  No frequency, urgency, dysuria, + postmenopausal bleeding Musculo Skeletal  No myalgia, arthralgia, joint swelling or pain  Neurologic  No weakness, numbness, change in gait,  Psychology  No depression, anxiety, insomnia.   Vitals:  Blood pressure 124/60, pulse 73, temperature 97.7 F (36.5 C), temperature source Oral, resp. rate 14, height 5\' 3"  (1.6 m), weight 136 lb (61.7 kg), SpO2 100 %.  Physical Exam: WD in NAD Neck  Supple NROM, without any enlargements.  Lymph Node Survey No cervical supraclavicular or inguinal adenopathy Cardiovascular  Pulse normal rate, regularity and rhythm. S1 and S2 normal.  Lungs  Clear to auscultation bilateraly, without wheezes/crackles/rhonchi. Good air movement.  Skin  No rash/lesions/breakdown  Psychiatry  Alert and oriented to person,  place, and time  Abdomen  Normoactive bowel sounds, abdomen soft, non-tender and thin without evidence of hernia.  Back No CVA tenderness Genito Urinary  Vulva/vagina: Normal external female genitalia.  No lesions. No discharge or bleeding.  Bladder/urethra:  No lesions or masses, well supported bladder  Vagina: normal  Cervix: Normal appearing, no lesions.  Uterus:  Small, mobile, no parametrial involvement or nodularity.  Adnexa: no palpable masses. Rectal  deferred Extremities  No bilateral cyanosis, clubbing or edema.   Thereasa Solo, MD  07/10/2018, 10:22 AM

## 2018-07-10 NOTE — Transfer of Care (Signed)
Immediate Anesthesia Transfer of Care Note  Patient: Dvora Buitron  Procedure(s) Performed: XI ROBOTIC ASSISTED TOTAL HYSTERECTOMY WITH BILATERAL SALPINGO OOPHORECTOMY (Bilateral ) SENTINEL NODE BIOPSY, LEFT PELVIC NODE (N/A )  Patient Location: PACU  Anesthesia Type:General  Level of Consciousness: awake, alert , oriented and patient cooperative  Airway & Oxygen Therapy: Patient Spontanous Breathing and Patient connected to face mask oxygen  Post-op Assessment: Report given to RN, Post -op Vital signs reviewed and stable and Patient moving all extremities  Post vital signs: Reviewed and stable  Last Vitals:  Vitals Value Taken Time  BP 143/75 07/10/2018 12:38 PM  Temp 36.8 C 07/10/2018 12:38 PM  Pulse 81 07/10/2018 12:39 PM  Resp 13 07/10/2018 12:39 PM  SpO2 97 % 07/10/2018 12:39 PM  Vitals shown include unvalidated device data.  Last Pain:  Vitals:   07/10/18 0919  TempSrc: Oral         Complications: No apparent anesthesia complications

## 2018-07-10 NOTE — Discharge Instructions (Signed)
07/10/2018  Return to work: 4 weeks  Activity: 1. Be up and out of the bed during the day.  Take a nap if needed.  You may walk up steps but be careful and use the hand rail.  Stair climbing will tire you more than you think, you may need to stop part way and rest.   2. No lifting or straining for 4 weeks.  3. No driving for 1 weeks.  Do Not drive if you are taking narcotic pain medicine.  4. Shower daily.  Use soap and water on your incision and pat dry; don't rub.   5. No sexual activity and nothing in the vagina for 8 weeks.  Medications:  - Take ibuprofen and tylenol first line for pain control. Take these regularly (every 6 hours) to decrease the build up of pain.  - If necessary, for severe pain not relieved by ibuprofen, take percocet.  - While taking percocet you should take sennakot every night to reduce the likelihood of constipation. If this causes diarrhea, stop its use.  - you can start taking 325 mg of aspirin in 1 week from surgery (April 3rd).   Diet: 1. Low sodium Heart Healthy Diet is recommended.  2. It is safe to use a laxative if you have difficulty moving your bowels.   Wound Care: 1. Keep clean and dry.  Shower daily.  Reasons to call the Doctor:   Fever - Oral temperature greater than 100.4 degrees Fahrenheit  Foul-smelling vaginal discharge  Difficulty urinating  Nausea and vomiting  Increased pain at the site of the incision that is unrelieved with pain medicine.  Difficulty breathing with or without chest pain  New calf pain especially if only on one side  Sudden, continuing increased vaginal bleeding with or without clots.   Follow-up: 1. See Everitt Amber in 3 weeks.  Contacts: For questions or concerns you should contact:  Dr. Everitt Amber at [phone] After hours and on week-ends call [phone] and ask to speak to the physician on call for Gynecologic Oncology     Total Laparoscopic Hysterectomy A total laparoscopic  hysterectomy is a minimally invasive surgery to remove the uterus and cervix. The fallopian tubes and ovaries can also be removed (bilateral salpingo-oophorectomy) during this surgery, if necessary. This procedure may be done to treat problems such as:  Noncancerous growths in the uterus (uterine fibroids) that cause symptoms.  A condition that causes the lining of the uterus (endometrium) to grow in other areas (endometriosis).  Problems with pelvic support. This is caused by weakened muscles of the pelvis following vaginal childbirth or menopause.  Cancer of the cervix, ovaries, uterus, or endometrium.  Excessive (dysfunctional) uterine bleeding. This surgery is performed by inserting a thin, lighted tube (laparoscope) and surgical instruments into small incisions in the abdomen. The laparoscope sends images to a monitor. The images help the health care provider perform the procedure. After this procedure, you will no longer be able to have a baby, and you will no longer have a menstrual period. Tell a health care provider about:  Any allergies you have.  All medicines you are taking, including vitamins, herbs, eye drops, creams, and over-the-counter medicines.  Any problems you or family members have had with anesthetic medicines.  Any blood disorders you have.  Any surgeries you have had.  Any medical conditions you have.  Whether you are pregnant or may be pregnant. What are the risks? Generally, this is a safe procedure. However, problems  may occur, including:  Infection.  Bleeding.  Blood clots in the legs or lungs.  Allergic reactions to medicines.  Damage to other structures or organs.  The risk that the surgery may have to be switched to the regular one in which a large incision is made in the abdomen (abdominal hysterectomy). What happens before the procedure? Staying hydrated Follow instructions from your health care provider about hydration, which may  include:  Up to 2 hours before the procedure - you may continue to drink clear liquids, such as water, clear fruit juice, black coffee, and plain tea Eating and drinking restrictions Follow instructions from your health care provider about eating and drinking, which may include:  8 hours before the procedure - stop eating heavy meals or foods such as meat, fried foods, or fatty foods.  6 hours before the procedure - stop eating light meals or foods, such as toast or cereal.  6 hours before the procedure - stop drinking milk or drinks that contain milk.  2 hours before the procedure - stop drinking clear liquids. Medicines  Ask your health care provider about: ? Changing or stopping your regular medicines. This is especially important if you are taking diabetes medicines or blood thinners. ? Taking over-the-counter medicines, vitamins, herbs, and supplements. ? Taking medicines such as aspirin and ibuprofen. These medicines can thin your blood. Do not take these medicines unless your health care provider tells you to take them.  You may be given antibiotic medicine to help prevent infection.  You may be asked to take laxatives.  You may be given medicines to help prevent nausea and vomiting after the procedure. General instructions  Ask your health care provider how your surgical site will be marked or identified.  You may be asked to shower with a germ-killing soap.  Do not use any products that contain nicotine or tobacco, such as cigarettes and e-cigarettes. If you need help quitting, ask your health care provider.  You may have an exam or testing, such as an ultrasound to determine the size and shape of your pelvic organs.  You may have a blood or urine sample taken.  This procedure can affect the way you feel about yourself. Talk with your health care provider about the physical and emotional changes hysterectomy may cause.  Plan to have someone take you home from the  hospital or clinic.  Plan to have a responsible adult care for you for at least 24 hours after you leave the hospital or clinic. This is important. What happens during the procedure?  To lower your risk of infection: ? Your health care team will wash or sanitize their hands. ? Your skin will be washed with soap. ? Hair may be removed from the surgical area.  An IV will be inserted into one of your veins.  You will be given one or more of the following: ? A medicine to help you relax (sedative). ? A medicine to make you fall asleep (general anesthetic).  You will be given antibiotic medicine through your IV.  A tube may be inserted down your throat to help you breathe during the procedure.  A gas (carbon dioxide) will be used to inflate your abdomen to allow your surgeon to see inside of your abdomen.  Three or four small incisions will be made in your abdomen.  A laparoscope will be inserted into one of your incisions. Surgical instruments will be inserted through the other incisions in order to perform the procedure.  Your uterus and cervix may be removed through your vagina or cut into small pieces and removed through the small incisions. Any other organs that need to be removed will also be removed this way.  Carbon dioxide will be released from inside of your abdomen.  Your incisions will be closed with stitches (sutures).  A bandage (dressing) may be placed over your incisions. The procedure may vary among health care providers and hospitals. What happens after the procedure?  Your blood pressure, heart rate, breathing rate, and blood oxygen level will be monitored until the medicines you were given have worn off.  You will be given medicine for pain and nausea as needed.  Do not drive for 24 hours if you received a sedative. Summary  Total Laparoscopic hysterectomy is a procedure to remove your uterus, cervix and sometimes the fallopian tubes and ovaries.  This  procedure can affect the way you feel about yourself. Talk with your health care provider about the physical and emotional changes hysterectomy may cause.  After this procedure, you will no longer be able to have a baby, and you will no longer have a menstrual period.  You will be given pain medicine to control discomfort after this procedure. This information is not intended to replace advice given to you by your health care provider. Make sure you discuss any questions you have with your health care provider. Document Released: 01/28/2007 Document Revised: 06/13/2016 Document Reviewed: 06/13/2016 Elsevier Interactive Patient Education  2019 [employer].

## 2018-07-10 NOTE — Op Note (Signed)
OPERATIVE NOTE 07/10/18  Surgeon: Donaciano Eva   Assistants: Dr Lahoma Crocker (an MD assistant was necessary for tissue manipulation, management of robotic instrumentation, retraction and positioning due to the complexity of the case and hospital policies).   Anesthesia: General endotracheal anesthesia  ASA Class: 3   Pre-operative Diagnosis: endometrial cancer grade 1  Post-operative Diagnosis: same,   Operation: Robotic-assisted laparoscopic total hysterectomy with bilateral salpingoophorectomy, SLN biopsy, left pelvic lymphadenectomy  Surgeon: Donaciano Eva  Assistant Surgeon: Lahoma Crocker MD  Anesthesia: GET  Urine Output: 100cc  Operative Findings:  : 6cm uterus, grossly normal appearing, normal tubes and ovaries and appendix. No suspicious nodes. Unilateral mapping to the right.   Estimated Blood Loss:  <20cc      Total IV Fluids: 900 ml         Specimens: uterus, cervix, bilateral tubes and ovaries, right external iliac SLN, left pelvic lymph nodes.         Complications:  None; patient tolerated the procedure well.         Disposition: PACU - hemodynamically stable.  Procedure Details  The patient was seen in the Holding Room. The risks, benefits, complications, treatment options, and expected outcomes were discussed with the patient.  The patient concurred with the proposed plan, giving informed consent.  The site of surgery properly noted/marked. The patient was identified as Rebecca Gordon and the procedure verified as a Robotic-assisted hysterectomy with bilateral salpingo oophorectomy with SLN biopsy. A Time Out was held and the above information confirmed.  After induction of anesthesia, the patient was draped and prepped in the usual sterile manner. Pt was placed in supine position after anesthesia and draped and prepped in the usual sterile manner. The abdominal drape was placed after the CholoraPrep had been allowed to dry for 3  minutes.  Her arms were tucked to her side with all appropriate precautions.  The shoulders were stabilized with padded shoulder blocks applied to the acromium processes.  The patient was placed in the semi-lithotomy position in [place].  The perineum was prepped with Betadine. The patient was then prepped. Foley catheter was placed.  A sterile speculum was placed in the vagina.  The cervix was grasped with a single-tooth tenaculum. 2mg  total of ICG was injected into the cervical stroma at 2 and 9 o'clock with 1cc injected at a 1cm and 34mm depth (concentration 0.5mg /ml) in all locations. The cervix was dilated with Kennon Rounds dilators.  The ZUMI uterine manipulator with a medium colpotomizer ring was placed without difficulty.  A pneum occluder balloon was placed over the manipulator.  OG tube placement was confirmed and to suction.   Next, a 5 mm skin incision was made 1 cm below the subcostal margin in the midclavicular line.  The 5 mm Optiview port and scope was used for direct entry.  Opening pressure was under 10 mm CO2.  The abdomen was insufflated and the findings were noted as above.   At this point and all points during the procedure, the patient's intra-abdominal pressure did not exceed 15 mmHg. Next, a 10 mm skin incision was made in the umbilicus and a right and left port was placed about 10 cm lateral to the robot port on the right and left side.  A fourth arm was placed in the left lower quadrant 2 cm above and superior and medial to the anterior superior iliac spine.  All ports were placed under direct visualization.  The patient was placed in  steep Trendelenburg.  Bowel was folded away into the upper abdomen.  The robot was docked in the normal manner.  The right and left peritoneum were opened parallel to the IP ligament to open the retroperitoneal spaces bilaterally. The SLN mapping was performed in bilateral pelvic basins. The para rectal and paravesical spaces were opened up entirely with  careful dissection below the level of the ureters bilaterally and to the depth of the uterine artery origin in order to skeletonize the uterine "web" and ensure visualization of all parametrial channels. The para-aortic basins were carefully exposed and evaluated for isolated para-aortic SLN's. Lymphatic channels were identified travelling to the following visualized sentinel lymph node's: right external iliac SLN, there was unilateral mapping to the right and therefore a left lymphadenectomy was performed. These SLN's were separated from their surrounding lymphatic tissue, removed and sent for permanent pathology.  The paravesical space was developed with monopolar and sharp dissection. It was held open with tension on the median umbilical ligament with the forth arm. The paravesical space was opened with blunt and sharp dissection to mobilize the ureter off of the medial surface of the internal iliac artery. The medial leaf of the broad ligament containing the ureter was held medially (opening the pararectal space) by the assistant's grasper. The left pelvic lymphadenectomy was performed by skeletonizing the internal iliac artery at the bifurcation with the external iliac artery. The obturator nerve was identified in the base of lateral paravesical space. The ureter was mobilized medially off of the dissection by developing the pararectal space. The genitofemoral nerve was identified, skeletonized and mobilized laterally off of the external iliac artery. An enbloc resection of lymph nodes was performed within the following boundaries: the mid portion of the common iliac proximally, the circumflex iliac vein distally, the obturator nerve posteriorally, the genitofemoral nerve laterally. The nodal basin (including obturator space) were confirmed to be empty of nodes and hemostatic. The nodes were placed in an endocatch bag and retrieved vaginally.  The hysterectomy was started after the round ligament on the  right side was incised and the retroperitoneum was entered and the pararectal space was developed.  The ureter was noted to be on the medial leaf of the broad ligament.  The peritoneum above the ureter was incised and stretched and the infundibulopelvic ligament was skeletonized, cauterized and cut.  The posterior peritoneum was taken down to the level of the KOH ring.  The anterior peritoneum was also taken down.  The bladder flap was created to the level of the KOH ring.  The uterine artery on the right side was skeletonized, cauterized and cut in the normal manner.  A similar procedure was performed on the left.  The colpotomy was made and the uterus, cervix, bilateral ovaries and tubes were amputated and delivered through the vagina.  Pedicles were inspected and excellent hemostasis was achieved.    The colpotomy at the vaginal cuff was closed with Vicryl on a CT1 needle in running manner.  Irrigation was used and excellent hemostasis was achieved.  At this point in the procedure was completed.  Robotic instruments were removed under direct visulaization.  The robot was undocked. The 10 mm ports were closed with Vicryl on a UR-5 needle and the fascia was closed with 0 Vicryl on a UR-5 needle.  The skin was closed with 4-0 Vicryl in a subcuticular manner.  Dermabond was applied.  Sponge, lap and needle counts correct x 2.  The patient was taken to the recovery room  in stable condition.  The vagina was swabbed with  minimal bleeding noted.   All instrument and needle counts were correct x  3.   The patient was transferred to the recovery room in a stable condition.  Donaciano Eva, MD

## 2018-07-10 NOTE — Anesthesia Postprocedure Evaluation (Signed)
Anesthesia Post Note  Patient: Rebecca Gordon  Procedure(s) Performed: XI ROBOTIC ASSISTED TOTAL HYSTERECTOMY WITH BILATERAL SALPINGO OOPHORECTOMY (Bilateral ) SENTINEL NODE BIOPSY, LEFT PELVIC NODE (N/A )     Patient location during evaluation: PACU Anesthesia Type: General Level of consciousness: sedated and patient cooperative Pain management: pain level controlled Vital Signs Assessment: post-procedure vital signs reviewed and stable Respiratory status: spontaneous breathing Cardiovascular status: stable Anesthetic complications: no    Last Vitals:  Vitals:   07/10/18 1315 07/10/18 1345  BP: (!) 119/58 128/62  Pulse: 78 74  Resp: 15 16  Temp: 36.7 C 36.7 C  SpO2: 96% 100%    Last Pain:  Vitals:   07/10/18 1345  TempSrc:   PainSc: [place]

## 2018-07-10 NOTE — Anesthesia Preprocedure Evaluation (Addendum)
Anesthesia Evaluation  Patient identified by MRN, date of birth, ID band Patient awake    Reviewed: Allergy & Precautions, NPO status , Patient's Chart, lab work & pertinent test results, reviewed documented beta blocker date and time   Airway Mallampati: I  TM Distance: >3 FB Neck ROM: Full    Dental  (+) Teeth Intact, Dental Advisory Given   Pulmonary COPD, Current Smoker, former smoker,    breath sounds clear to auscultation       Cardiovascular hypertension, Pt. on medications and Pt. on home beta blockers  Rhythm:Regular Rate:Normal     Neuro/Psych Anxiety Depression negative neurological ROS     GI/Hepatic Neg liver ROS, GERD  ,  Endo/Other  negative endocrine ROS  Renal/GU negative Renal ROS     Musculoskeletal  (+) Arthritis ,   Abdominal   Peds  Hematology negative hematology ROS (+)   Anesthesia Other Findings   Reproductive/Obstetrics                             Anesthesia Physical Anesthesia Plan  ASA: III  Anesthesia Plan: General   Post-op Pain Management:    Induction: Intravenous and Rapid sequence  PONV Risk Score and Plan: 4 or greater and Ondansetron, Dexamethasone, Midazolam, Scopolamine patch - Pre-op and Treatment may vary due to age or medical condition  Airway Management Planned: Oral ETT  Additional Equipment: None  Intra-op Plan:   Post-operative Plan: Extubation in OR  Informed Consent: I have reviewed the patients History and Physical, chart, labs and discussed the procedure including the risks, benefits and alternatives for the proposed anesthesia with the patient or authorized representative who has indicated his/her understanding and acceptance.     Dental advisory given  Plan Discussed with: CRNA  Anesthesia Plan Comments:         Anesthesia Quick Evaluation

## 2018-07-11 ENCOUNTER — Encounter (HOSPITAL_COMMUNITY): Payer: Self-pay | Admitting: Gynecologic Oncology

## 2018-07-11 ENCOUNTER — Telehealth: Payer: Self-pay

## 2018-07-11 NOTE — Telephone Encounter (Signed)
Ms Milford stated that her right neck is sore with movement.  She did not know if it were the antinausea patch contributing to her pain. Told her that the discomfort is from the positioning during surgery. She can apply heat alt. with ice to the neck and apply icy hot or Beng[place] to her neck. The antiemetic patch can be removed tomorrow. It stays on for 48 hours post op.  She is doing well otherwise. She is eating well. Urinating and moving bowels. She knows to call the office at [phone] if any further concerns arise. Information above reviewed with Joylene John, NP.

## 2018-07-15 ENCOUNTER — Telehealth: Payer: Self-pay

## 2018-07-15 NOTE — Telephone Encounter (Signed)
Told Ms Getty  that the final pathology showed the endometrial cancer in the uterus only. Lymph Nodes negative as well as cervix ovaries, and tubes for cancer per Joylene John, NP.  No extra treatment needed. Pt doing well.  Sore neck post op has resolved. Eating, urinating, and moving bowels well. Pt to keep post op appointment on 07-30-18 as scheduled.

## 2018-07-23 ENCOUNTER — Ambulatory Visit: Payer: Self-pay | Admitting: *Deleted

## 2018-07-23 DIAGNOSIS — K921 Melena: Secondary | ICD-10-CM

## 2018-07-23 NOTE — Telephone Encounter (Signed)
Spoke with patient who was refusing to go to any facility to be seen and explained that with having two surgeries in the past month rectal tear & hysterectomy] and now having black, tarry/sticky stools that we need to have her get a simple stool test for standard procedure [she has Not taken any Pepto-bismol and/or Iron], she will most likely need Imaging done to check for any emergent issues that are a possibility after surgery; then would probably want to f/u with a colonoscopy. Pt states that she does not "care for her GI provider but has had problems with [facility] trying to get a new provider". I assured her that we would address that issue after we deal with today and what's in front of [place], as our patient's health and well-being is our priority, especially to assist during times of uncertainty like now. I told Rebecca Gordon that with her having [2] surgeries in the past month makes her compromised and I can't guarantee the status of pt's in the ED for covid, so I asked her if she would please go to the [facility] UC outside of [facility]. She said she didn't have a mask but had a scarf; I asked her if she had coffee filters and she said yes; I instructed her to place the coffee filter in the scarf and then fold the top & bottom around it, this becomes a filtered mask. This seem to help her anxiousness and she thanked [place] for our help; she will report to UC/SLS 04/08

## 2018-07-23 NOTE — Telephone Encounter (Signed)
Patient has rectal surgery on 3/10- to correct a rectal tear.Patient has been using Miralax daily.Stools have been fairly regular normal.  Patient has some external hemorrhoids. Patient then had hysterectomy for endometrial cancer- robotic surgery so she been on some pain medication- 3/26. Patient reports she had a change in stool that it was black and tarry- sticky. Patient states she has been going twice in morning and has not been going in the afternoon or evening.  Patient advised with her history and symptoms- advisement is ED- she refuses. Call to office to see if someone in the office can convince her to go.  Reason for Disposition . Tarry or jet black-colored stool (not dark green)  Answer Assessment - Initial Assessment Questions 1. COLOR: "What color is it?" "Is that color in part or all of the stool?"     Dark- jet black, tarry- sticky-  Whole stool   2. ONSET: "When was the unusual color first noted?"     Last couple days portion would be black- but not the whole thing  3. CAUSE: "Have you eaten any food or taken any medicine of this color?" (See listing in BACKGROUND)     No changes in medication or foods  4. OTHER SYMPTOMS: "Do you have any other symptoms?" (e.g., diarrhea, jaundice, abdominal pain, fever).     Patient is post surgical- some loss of appetite, otherwise reports she was doing good  Protocols used: RECTAL BLEEDING-A-AH, STOOLS - UNUSUAL COLOR-A-AH

## 2018-07-24 ENCOUNTER — Telehealth: Payer: Self-pay

## 2018-07-24 NOTE — Addendum Note (Signed)
Addended by: Rockwell Germany on: 07/24/2018 02:22 PM   Modules accepted: Orders

## 2018-07-24 NOTE — Telephone Encounter (Signed)
Copied from [place] [id number]. Topic: General - Other >> Jul 24, 2018  9:30 AM Richardo Priest, NT wrote: Reason for CRM: Rebecca Gordon from [facility] called in regards to patient. States patient was instructed yesterday by practice to go to ED or Urgent Care and patient is refusing. Wants [place] to be aware patient does plan on calling back today to seek more advice and that she is still have dark stool. Rebecca Gordon was also informed that patient is taking 325mg  aspirin that they were unaware she was taking as well. If call back needed call [phone].

## 2018-07-24 NOTE — Telephone Encounter (Signed)
Copied from [place] [id number]. Topic: General - Other >> Jul 24, 2018  9:29 AM Sheran Luz wrote: Reason for CRM: Patient called requesting to speak with Ivin Booty. She would like a call back regarding conversation she states they had yesterday.

## 2018-07-24 NOTE — Telephone Encounter (Signed)
Spoke with patient about her trip to [facility] yesterday where they did not have the capability to do CT scan that provider had wanted. Patient still refuses to go to any ED/hospital facility but agreed to go to [place]. I retrieved some more information regarding other symptoms and Informed her that I would speak with Dr. Etter Sjogren and have order placed for CT to be done at [place] [facility] and patient agreed to this/SLS 04/09

## 2018-07-24 NOTE — Telephone Encounter (Signed)
Please call pt === she needs labs,  She needs stool checked for blood We can not do that over the phone or via web visit  She really needs to be seen in an uc or er to make sure she is not bleeding  Stop aspirin

## 2018-07-28 ENCOUNTER — Encounter: Payer: Self-pay | Admitting: Family Medicine

## 2018-07-28 ENCOUNTER — Telehealth: Payer: Self-pay | Admitting: *Deleted

## 2018-07-28 ENCOUNTER — Telehealth: Payer: Self-pay | Admitting: Oncology

## 2018-07-28 NOTE — Telephone Encounter (Signed)
I know nothing about this---- I saw that someone put an ct order in but I did not order this--- I asked sharon who did it because it looked like she put it in but never got an answer

## 2018-07-28 NOTE — Telephone Encounter (Signed)
Called and spoke with the patient regarding her appt on 4/15. Patient has had no signs/symptoms, has not traveled or had an exposure. Patient has had no contact with anyone sick, who had traveled, or had an exposure. Explained that she will be asked these questions again Wednesday at the front desk, along with a temperature check. Also explained the no visitor policy and the new parking (no valet) due to COVID-19

## 2018-07-28 NOTE — Telephone Encounter (Signed)
Attempted to reach pt. Left detailed message on pt's voicemail regarding below recommendation for ER eval and advised Zacarias Pontes and for her to call and let us know she received our message and her plan of action. Also left message advising her to stop ASA at this time.

## 2018-07-28 NOTE — Telephone Encounter (Signed)
Dr [name] -- there have been several mychart / phone messages going on this patient. I think pt is referencing 07/23/18 nurse triage note regarding CT she is requesting. Please see above message and advise?

## 2018-07-28 NOTE — Telephone Encounter (Signed)
Rebecca Gordon called and said she has been feeling great since surgery.  She was on a call with Dr. Clyda Greener nurse and mentioned that she had been having black stools which started last week.  She was told to contact her PCP or go to the ER.  She has called Dr. Nonda Lou office and was advised to go to [facility] urgent care or ER to be evaluated with a CT scan.  She went to urgent care and was told they could not do a CT scan so she left.  She refuses to go to [facility] ER because of Covid-19.  She said a CT scan is being arranged at [place] and she was notified this morning that she will also need labs.  So it was recommended that she go to urgent care or the ER.  She is wondering if she will be seen by Dr. Denman George in the office on 4/15 or by Webex.  Advised her that it will be in person and that she may be able to have labs while she is at the cancer center.  She is going to contact Dr. Nonda Lou office to see. [name] also said her stools are now brown and seem normal to her.  She has stopped taking Advil and will stop taking aspirin.

## 2018-07-29 ENCOUNTER — Other Ambulatory Visit: Payer: Self-pay

## 2018-07-29 ENCOUNTER — Ambulatory Visit (INDEPENDENT_AMBULATORY_CARE_PROVIDER_SITE_OTHER): Payer: BC Managed Care – PPO | Admitting: Family Medicine

## 2018-07-29 DIAGNOSIS — I10 Essential (primary) hypertension: Secondary | ICD-10-CM

## 2018-07-29 DIAGNOSIS — R195 Other fecal abnormalities: Secondary | ICD-10-CM | POA: Diagnosis not present

## 2018-07-29 MED ORDER — METOPROLOL SUCCINATE ER 100 MG PO TB24: 100.0000 mg | ORAL_TABLET | Freq: Every day | ORAL | 1 refills | Status: DC

## 2018-07-29 NOTE — Telephone Encounter (Signed)
Copy & Pasted from 07/24/18 phone note:SLS Conversation  (Newest Message First)  July 28, 2018  Kelle Darting A, [place]    9:59 AM  Note    Attempted to reach pt. Left detailed message on pt's voicemail regarding below recommendation for ER eval and advised Zacarias Pontes and for her to call and let us know she received our message and her plan of action. Also left message advising her to stop ASA at this time.    July 24, 2018  [name], DO to Ronny Flurry, CMA . Me     5:00 PM  Note    Please call pt === she needs labs,  She needs stool checked for blood We can not do that over the phone or via web visit  She really needs to be seen in an uc or er to make sure she is not bleeding  Stop aspirin     2:54 PM  [place], Consuello Bossier, CMA routed this conversation to [employer], Alferd Apa, DO  Loleta Chance, CMA   9:48 AM  Note    Copied from [place] [id number]. Topic: General - Other >> Jul 24, 2018  9:30 AM Richardo Priest, NT wrote: Reason for CRM: Eyvonne Mechanic from [facility] called in regards to patient. States patient was instructed yesterday by practice to go to ED or Urgent Care and patient is refusing. Wants [place] to be aware patient does plan on calling back today to seek more advice and that she is still have dark stool. Eyvonne Mechanic was also informed that patient is taking 325mg  aspirin that they were unaware she was taking as well. If call back needed call [phone].      9:36 AM  Loleta Chance, CMA routed this conversation to Doylene Canning, [place]

## 2018-07-29 NOTE — Telephone Encounter (Signed)
Copy & Pasted from 04/13 Oncology phone note:SLS Conversation: Advice Only  (Newest Message First)  07/28/18 12:33 PM  Awanda Mink, Craige Cotta, RN routed this conversation to Dorothyann Gibbs, NP   07/28/18 12:33 PM  Note    Rebecca Gordon called and said she has been feeling great since surgery.  She was on a call with Dr. Clyda Greener nurse and mentioned that she had been having black stools which started last week.  She was told to contact her PCP or go to the ER.  She has called Dr. Nonda Lou office and was advised to go to [facility] urgent care or ER to be evaluated with a CT scan.  She went to urgent care and was told they could not do a CT scan so she left.  She refuses to go to [facility] ER because of Covid-19.  She said a CT scan is being arranged at [place] and she was notified this morning that she will also need labs.  So it was recommended that she go to urgent care or the ER.  She is wondering if she will be seen by Dr. Denman George in the office on 4/15 or by Webex.  Advised her that it will be in person and that she may be able to have labs while she is at the cancer center.  She is going to contact Dr. Nonda Lou office to see. [name] also said her stools are now brown and seem normal to her.  She has stopped taking Advil and will stop taking aspirin.     07/28/18 12:23 PM    Awanda Mink, Craige Cotta, RN contacted Alejandro Mulling

## 2018-07-29 NOTE — Progress Notes (Signed)
Virtual Visit via Video Note  I connected with Rebecca Gordon on 07/30/18 at  3:00 PM EDT by a video enabled telemedicine application and verified that I am speaking with the correct person using two identifiers.   I discussed the limitations of evaluation and management by telemedicine and the availability of in person appointments. The patient expressed understanding and agreed to proceed.  History of Present Illness: Pt is home c/o dark stools.  She just had surgery with Dr gross for anal fissure and had gyn surgery for total tah/ bso   she called the [facility] and was told to come here because it had nothing to do with her surgery   She denies any abd pain / rectal pain.  No gerd symptoms  Observations/Objective: Afebrile,  rr normal, pt in NAD Unable to get vitals --- pt does not have equipment to do so    Past Medical History:  Diagnosis Date  . Anal fissure   . 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 Anxiety   . Depression   . 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 Endometrial polyp   . Former smoker 09/23/2016  . GERD (gastroesophageal reflux disease)    Nexium  . H/O clavicle fracture 2017   Right  . High cholesterol   . History of bronchitis   . History of pneumonia    years ago  . Hypertension   . LVH (left ventricular hypertrophy) 09/23/2016   Mild, noted on ECHO  . Numbness    left fingers after TIA  . OA (osteoarthritis)    "right knee"  . Osteoporosis 08/2017   T score -2.7  . Panic attacks   . Postmenopausal bleeding   . Thyroid nodule 2019   Multiple  . TIA (transient ischemic attack) 09/2016  . Wears contact lenses    Current Outpatient Medications on File Prior to Visit  Medication Sig Dispense Refill  . alendronate (FOSAMAX) 70 MG tablet Take 1 tablet (70 mg total) by mouth every 7 (seven) days. Take with a full glass of water on an empty stomach. (Patient taking differently: Take 70 mg by mouth every Sunday.  Take with a full glass of water on an empty stomach.) 12 tablet 4  . amLODipine (NORVASC) 10 MG tablet TAKE 1 TABLET BY MOUTH EVERY DAY (Patient taking differently: Take 10 mg by mouth daily. ) 90 tablet 3  . aspirin 325 MG tablet Take 325 mg by mouth daily.    . cholecalciferol (VITAMIN D) 1000 units tablet Take 1,000 Units by mouth every morning.     . desvenlafaxine (PRISTIQ) 50 MG 24 hr tablet Take 1 tablet (50 mg total) by mouth daily. 90 tablet 3  . esomeprazole (NEXIUM) 20 MG capsule Take 20 mg by mouth daily at 12 noon.    . fluticasone (FLONASE) 50 MCG/ACT nasal spray SPRAY 2 SPRAYS INTO EACH NOSTRIL EVERY DAY (Patient taking differently: Place 2 sprays into both nostrils daily as needed for allergies. ) 48 g 0  . hydrochlorothiazide (MICROZIDE) 12.5 MG capsule TAKE 1 CAPSULE BY MOUTH EVERY DAY (Patient taking differently: Take 12.5 mg by mouth daily. ) 90 capsule 3  . losartan (COZAAR) 100 MG tablet TAKE 1 TABLET BY MOUTH EVERY DAY (Patient taking differently: Take 100 mg by mouth daily. ) 90 tablet 3  . polyethylene glycol powder (GLYCOLAX/MIRALAX) powder Take 17 g by mouth daily.    . Probiotic  Product [facility] IMMUNE HEALTH PO) Take 1 capsule by mouth daily.    . rosuvastatin (CRESTOR) 40 MG tablet Take 1 tablet (40 mg total) by mouth daily. 90 tablet 1  . topiramate (TOPAMAX) 100 MG tablet Take 1 tablet (100 mg total) by mouth daily. TAKE 1 TABLET BY MOUTH EVERYDAY AT BEDTIME (Patient taking differently: Take 100 mg by mouth at bedtime. ) 90 tablet 1  . traZODone (DESYREL) 50 MG tablet Take 50 mg by mouth at bedtime as needed for sleep.   1   No current facility-administered medications on file prior to visit.     Assessment and Plan: 1. Essential hypertension Unable to check-- pt needed refill on med  - metoprolol succinate (TOPROL-XL) 100 MG 24 hr tablet; Take 1 tablet (100 mg total) by mouth daily. Take with or immediately following a meal.  Dispense: 90 tablet; Refill:  1  2. Dark stools Check I fob ---  Pt coming into office for stool check  Labs being done as well  If any gross blood or pain -- go to ER  - Fecal occult blood, imunochemical; Future   Follow Up Instructions:    I discussed the assessment and treatment plan with the patient. The patient was provided an opportunity to ask questions and all were answered. The patient agreed with the plan and demonstrated an understanding of the instructions.   The patient was advised to call back or seek an in-person evaluation if the symptoms worsen or if the condition fails to improve as anticipated.    [name], DO

## 2018-07-29 NOTE — Telephone Encounter (Signed)
Copy & Pasted from Oncology Note:SLS Conversation: Advice Only  (Newest Message First)   07/28/18 12:33 PM  Awanda Mink, Craige Cotta, RN routed this conversation to Dorothyann Gibbs, NP  Jacqulyn Liner, RN   07/28/18 12:33 PM  Note    Rebecca Gordon called and said she has been feeling great since surgery.  She was on a call with Dr. Clyda Greener nurse and mentioned that she had been having black stools which started last week.  She was told to contact her PCP or go to the ER.  She has called Dr. Nonda Lou office and was advised to go to [facility] urgent care or ER to be evaluated with a CT scan.  She went to urgent care and was told they could not do a CT scan so she left.  She refuses to go to [facility] ER because of Covid-19.  She said a CT scan is being arranged at [facility] and she was notified this morning that she will also need labs.  So it was recommended that she go to urgent care or the ER.  She is wondering if she will be seen by Dr. Denman George in the office on 4/15 or by Webex.  Advised her that it will be in person and that she may be able to have labs while she is at the cancer center.  She is going to contact Dr. Nonda Lou office to see. [name] also said her stools are now brown and seem normal to her.  She has stopped taking Advil and will stop taking aspirin.

## 2018-07-29 NOTE — Telephone Encounter (Signed)
Appointment made for today

## 2018-07-29 NOTE — Telephone Encounter (Signed)
Copy & Pasted from 04/13 [place] phone note:SLS Conversation: Non-Urgent Medical Question  (Newest Message First)   07/28/18 4:11 PM  [name], DO routed this conversation to Ronny Flurry, CMA . [name] D, CMA . Me   [name], DO    07/28/18 4:11 PM  Note    I know nothing about this---- I saw that someone put an ct order in but I did not order this--- I asked sharon who did it because it looked like she put it in but never got an answer      07/28/18 3:33 PM  Luberta Mutter, Consuello Bossier, CMA routed this conversation to [name], Alferd Apa, DO . Doylene Canning, CMA   Luberta Mutter Consuello Bossier, CMA    07/28/18 3:33 PM  Note    Dr [name] -- there have been several mychart / phone messages going on this patient. I think pt is referencing 07/23/18 nurse triage note regarding CT she is requesting. Please see above message and advise?    Alejandro Mulling  to [place], [place], DO        07/28/18 2:16 PM  Dr. Etter Sjogren,   I understand that you will be putting in an order for a CT scan with [facility] to address my upper GI issues (black stole).  That option works much better for me, and I do NOT want to go to [facility] ER at this time. I am only two weeks out from a full hysterectomy and have two other surgeries since February 10th of this year.   In addition, I have an appointment with my Oncologist, Dr. Everitt Amber, at the [facility] this Wednesday, 4/15. If you can send them an order for blood work, they can complete the hemoglobin test (and any other tests needed) while I am there.   Thank you.   Florella Mcneese [facility]  [phone]

## 2018-07-30 ENCOUNTER — Encounter: Payer: Self-pay | Admitting: Family Medicine

## 2018-07-30 ENCOUNTER — Encounter: Payer: Self-pay | Admitting: Gynecologic Oncology

## 2018-07-30 ENCOUNTER — Inpatient Hospital Stay: Payer: BLUE CROSS/BLUE SHIELD | Attending: Gynecologic Oncology | Admitting: Gynecologic Oncology

## 2018-07-30 ENCOUNTER — Other Ambulatory Visit: Payer: Self-pay

## 2018-07-30 VITALS — BP 104/52 | HR 75 | Temp 98.0°F | Resp 16 | Ht 63.5 in | Wt 130.0 lb

## 2018-07-30 DIAGNOSIS — C541 Malignant neoplasm of endometrium: Secondary | ICD-10-CM

## 2018-07-30 DIAGNOSIS — Z90722 Acquired absence of ovaries, bilateral: Secondary | ICD-10-CM | POA: Diagnosis not present

## 2018-07-30 DIAGNOSIS — Z7189 Other specified counseling: Secondary | ICD-10-CM | POA: Diagnosis not present

## 2018-07-30 DIAGNOSIS — Z90710 Acquired absence of both cervix and uterus: Secondary | ICD-10-CM | POA: Diagnosis not present

## 2018-07-30 NOTE — Progress Notes (Signed)
Follow-up Note: Gyn-Onc  Consult was initially requested by Dr. Dellis Filbert for the evaluation of Alejandro Mulling 63 y.o. female  CC:  Chief Complaint  Patient presents with  . Endometrial cancer [facility])    Assessment/Plan:  Ms. Gennavieve Huq  is a 63 y.o.  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  Pathology revealed low risk factors for recurrence, therefore no adjuvant therapy is recommended according to NCCN guidelines.  I discussed risk for recurrence and typical symptoms encouraged her to notify us of these should they develop between visits.  I recommend she have follow-up every 6 months for 5 years in accordance with NCCN guidelines. Those visits should include symptom assessment, physical exam and pelvic examination. Pap smears are not indicated or recommended in the routine surveillance of endometrial cancer.  I will have her seen by genetics given the equivocal result in MMR protein PMS2.   HPI: Ms Adaijah Endres is a 63 year old P2 who is seen in consultation at the request of Dr Dellis Filbert for grade 1 endometrial cancer.  The patient reports menopausal bleeding since November 2019.  She had posterior menopause at age 43.  She was seen by Dr. Dellis Filbert who performed a transvaginal ultrasound and a sonohysterogram on May 08, 2018.  This revealed an anteverted uterus measuring 6.9 x 4 x 2.9 cm.  The endometrium was 7 mm with a 1.7 x 7 mm endometrial mass thought to be an endometrial polyp.  She was then taken to the operating room for a D&C procedure and hysteroscopy on May 26, 2018.  Final pathology from that procedure revealed a FIGO grade 1 endometrioid adenocarcinoma.  The patient has a history of a TIA in 2018 treated at [name] with full work-up.  No cardiac or vascular pathology was identified as part of the work-up.  She was placed on aspirin 325 mg daily following this episode.  Her only residual symptoms from her  TIA are left hand numbness and tingling and some strange feelings in her left upper.  She is had no prior abdominal surgeries.  She has had 2 prior vaginal deliveries.  Her family history is significant for father with brain cancer, skin cancer, and prostate cancer.  There is a lot of colon cancer on that side of the family.  She reports significant anal pain from an anal fissure diagnosed in August, 2019. No relief with therapies by Dr Collene Mares.   Interval hx: On 07/10/18 she underwent robotic assisted total hysterectomy, BSO, SLN biopsy and left lymphadenectomy. Pathology revealed a grade 1 endometrioid endometrial cancer with 1 of 68m (inner half) myometrial invasion and LVSI no present. Lymph nodes, cervix and adnexa were negative.  She was determined to have low risk for recurrence and was recommended no further adjuvant therapy in accordance with NCCN guidelines.   Postop she developed symptoms of left thigh weakness with adduction (mild), can still walk.   Current Meds:  Outpatient Encounter Medications as of 07/30/2018  Medication Sig  . alendronate (FOSAMAX) 70 MG tablet Take 1 tablet (70 mg total) by mouth every 7 (seven) days. Take with a full glass of water on an empty stomach. (Patient taking differently: Take 70 mg by mouth every Sunday. Take with a full glass of water on an empty stomach.)  . amLODipine (NORVASC) 10 MG tablet TAKE 1 TABLET BY MOUTH EVERY DAY (Patient taking differently: Take 10 mg by mouth daily. )  .  aspirin 325 MG tablet Take 325 mg by mouth daily.  . cholecalciferol (VITAMIN D) 1000 units tablet Take 1,000 Units by mouth every morning.   . desvenlafaxine (PRISTIQ) 50 MG 24 hr tablet Take 1 tablet (50 mg total) by mouth daily.  Marland Kitchen esomeprazole (NEXIUM) 20 MG capsule Take 20 mg by mouth daily at 12 noon.  . fluticasone (FLONASE) 50 MCG/ACT nasal spray SPRAY 2 SPRAYS INTO EACH NOSTRIL EVERY DAY (Patient taking differently: Place 2 sprays into both nostrils daily as  needed for allergies. )  . hydrochlorothiazide (MICROZIDE) 12.5 MG capsule TAKE 1 CAPSULE BY MOUTH EVERY DAY (Patient taking differently: Take 12.5 mg by mouth daily. )  . losartan (COZAAR) 100 MG tablet TAKE 1 TABLET BY MOUTH EVERY DAY (Patient taking differently: Take 100 mg by mouth daily. )  . metoprolol succinate (TOPROL-XL) 100 MG 24 hr tablet Take 1 tablet (100 mg total) by mouth daily. Take with or immediately following a meal.  . polyethylene glycol powder (GLYCOLAX/MIRALAX) powder Take 17 g by mouth daily.  . Probiotic Product ([employer] IMMUNE HEALTH PO) Take 1 capsule by mouth daily.  . rosuvastatin (CRESTOR) 40 MG tablet Take 1 tablet (40 mg total) by mouth daily.  Marland Kitchen topiramate (TOPAMAX) 100 MG tablet Take 1 tablet (100 mg total) by mouth daily. TAKE 1 TABLET BY MOUTH EVERYDAY AT BEDTIME (Patient taking differently: Take 100 mg by mouth at bedtime. )  . traZODone (DESYREL) 50 MG tablet Take 50 mg by mouth at bedtime as needed for sleep.    No facility-administered encounter medications on file as of 07/30/2018.     Allergy:  Allergies  Allergen Reactions  . Baclofen Other (See Comments)    Causes gas  . Neurontin [Gabapentin] Other (See Comments)    Causes gas     Social Hx:   Social History   Socioeconomic History  . Marital status: Married    Spouse name: Not on file  . Number of children: 2  . Years of education: College  . Highest education level: Not on file  Occupational History  . Occupation: Unemployed  Social Needs  . Financial resource strain: Not on file  . Food insecurity:    Worry: Not on file    Inability: Not on file  . Transportation needs:    Medical: Not on file    Non-medical: Not on file  Tobacco Use  . Smoking status: Current Every Day Smoker    Packs/day: 1.00    Years: 40.00    Pack years: 40.00    Types: Cigarettes  . Smokeless tobacco: Never Used  . Tobacco comment: Previous 1 pack a day  Substance and Sexual Activity  . Alcohol  use: Not Currently    Comment: daily - 1 bottle of wine--- 09/23/2016, 3-4 glasses--- 10/15/16  . Drug use: No  . Sexual activity: Yes    Birth control/protection: Post-menopausal    Comment: 1st intercourse- 24, partners- 78, married- 14 yrs   Lifestyle  . Physical activity:    Days per week: Not on file    Minutes per session: Not on file  . Stress: Not on file  Relationships  . Social connections:    Talks on phone: Not on file    Gets together: Not on file    Attends religious service: Not on file    Active member of club or organization: Not on file    Attends meetings of clubs or organizations: Not on file    Relationship  status: Not on file  . Intimate partner violence:    Fear of current or ex partner: Not on file    Emotionally abused: Not on file    Physically abused: Not on file    Forced sexual activity: Not on file  Other Topics Concern  . Not on file  Social History Narrative   Lives at home w/ her husband   Right-handed   Caffeine: 2-3 cups per day    Past Surgical Hx:  Past Surgical History:  Procedure Laterality Date  . CLAVICLE SURGERY Right 2016   fracture repair  . COLONOSCOPY  2017   multiples  . DILATATION & CURETTAGE/HYSTEROSCOPY WITH MYOSURE N/A 05/26/2018   Procedure: DILATATION & CURETTAGE/HYSTEROSCOPY WITH MYOSURE;  Surgeon: Princess Bruins, MD;  Location: [place];  Service: Gynecology;  Laterality: N/A;  . EYE SURGERY Right    "growth on eye removed"  . FOOT SURGERY Right 08/2017   benign mass  . HEMORRHOID SURGERY N/A 06/24/2018   Procedure: HEMORRHOIDECTOMY;  Surgeon: Michael Boston, MD;  Location: [facility];  Service: General;  Laterality: N/A;  . KNEE ARTHROSCOPY Right    Menicus repair  . ORIF CLAVICULAR FRACTURE Right 04/28/2015   Procedure: REVISION ORIF RIGHT CLAVICAL FRACTURE, ALLOGRAFT BONE GRAFTING;  Surgeon: Justice Britain, MD;  Location: [place];  Service: Orthopedics;  Laterality: Right;  .  PLACEMENT OF BREAST IMPLANTS Bilateral   . ROBOTIC ASSISTED TOTAL HYSTERECTOMY WITH BILATERAL SALPINGO OOPHERECTOMY Bilateral 07/10/2018   Procedure: XI ROBOTIC ASSISTED TOTAL HYSTERECTOMY WITH BILATERAL SALPINGO OOPHORECTOMY;  Surgeon: Everitt Amber, MD;  Location: [facility];  Service: Gynecology;  Laterality: Bilateral;  . SENTINEL NODE BIOPSY N/A 07/10/2018   Procedure: SENTINEL NODE BIOPSY, LEFT PELVIC NODE;  Surgeon: Everitt Amber, MD;  Location: [facility];  Service: Gynecology;  Laterality: N/A;  . SPHINCTEROTOMY N/A 06/24/2018   Procedure: ANAL SPHINCTEROTOMY, ANORECTAL EXAM UNDER ANESTHESIA;  Surgeon: Michael Boston, MD;  Location: [place];  Service: General;  Laterality: N/A;  . TONSILLECTOMY      Past Medical Hx:  Past Medical History:  Diagnosis Date  . Anal fissure   . 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 Anxiety   . Depression   . 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 Endometrial polyp   . Former smoker 09/23/2016  . GERD (gastroesophageal reflux disease)    Nexium  . H/O clavicle fracture 2017   Right  . High cholesterol   . History of bronchitis   . History of pneumonia    years ago  . Hypertension   . LVH (left ventricular hypertrophy) 09/23/2016   Mild, noted on ECHO  . Numbness    left fingers after TIA  . OA (osteoarthritis)    "right knee"  . Osteoporosis 08/2017   T score -2.7  . Panic attacks   . Postmenopausal bleeding   . Thyroid nodule 2019   Multiple  . TIA (transient ischemic attack) 09/2016  . Wears contact lenses     Past Gynecological History:  See HPI No LMP recorded. Patient is postmenopausal.  Family Hx:  Family History  Problem Relation Age of Onset  . Heart failure Mother   . Hypertension Father   . Brain cancer Father   . Prostate cancer Father   . Skin cancer Father     Review of Systems:  Constitutional  Feels well,    ENT Normal appearing  ears and nares  bilaterally Skin/Breast  No rash, sores, jaundice, itching, dryness Cardiovascular  No chest pain, shortness of breath, or edema  Pulmonary  No cough or wheeze.  Gastro Intestinal  No nausea, vomitting, or diarrhoea. No bright red blood per rectum, no abdominal pain, change in bowel movement, or constipation. Genito Urinary  No frequency, urgency, dysuria, no  bleeding Musculo Skeletal  No myalgia, arthralgia, joint swelling or pain  Neurologic  No weakness, numbness, change in gait,  Psychology  No depression, anxiety, insomnia.   Vitals:  Blood pressure (!) 104/52, pulse 75, temperature 98 F (36.7 C), resp. rate 16, height 5' 3.5" (1.613 m), weight 130 lb (59 kg), SpO2 99 %.  Physical Exam: WD in NAD Neck  Supple NROM, without any enlargements.  Lymph Node Survey No cervical supraclavicular or inguinal adenopathy Cardiovascular  Pulse normal rate, regularity and rhythm. S1 and S2 normal.  Lungs  Clear to auscultation bilateraly, without wheezes/crackles/rhonchi. Good air movement.  Skin  No rash/lesions/breakdown  Psychiatry  Alert and oriented to person, place, and time  Abdomen  Normoactive bowel sounds, abdomen soft, non-tender and thin without evidence of hernia. Incisions well healed Back No CVA tenderness Genito Urinary  Vulva/vagina: Normal external female genitalia.  No lesions. No discharge or bleeding.  Vaginal cuff well healed.  Rectal  deferred Extremities  No bilateral cyanosis, clubbing or edema.   30 minutes of direct face to face counseling time was spent with the patient. This included discussion about prognosis, therapy recommendations and postoperative side effects and are beyond the scope of routine postoperative care.   Thereasa Solo, MD  07/30/2018, 2:55 PM

## 2018-07-30 NOTE — Patient Instructions (Signed)
Please notify Dr Denman George at phone number [phone] if you notice vaginal bleeding, new pelvic or abdominal pains, bloating, feeling full easy, or a change in bladder or bowel function.   Please contact Dr Serita Grit office (at [phone]) in July, 2020 to request an appointment with her for October, 2020.  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 Progressive strengthening exercises should help with this. Notify her office if this gets worse or you would like a referral to physical therapy.

## 2018-07-30 NOTE — Telephone Encounter (Signed)
Patient done virtual visit yesterday and has an appt on Friday to come in office.

## 2018-08-01 ENCOUNTER — Encounter: Payer: Self-pay | Admitting: Family Medicine

## 2018-08-01 ENCOUNTER — Ambulatory Visit: Payer: BC Managed Care – PPO | Admitting: Family Medicine

## 2018-08-01 ENCOUNTER — Other Ambulatory Visit: Payer: Self-pay

## 2018-08-01 VITALS — BP 116/51 | HR 71 | Temp 98.3°F | Resp 12 | Ht 63.0 in | Wt 132.4 lb

## 2018-08-01 DIAGNOSIS — E785 Hyperlipidemia, unspecified: Secondary | ICD-10-CM

## 2018-08-01 DIAGNOSIS — I10 Essential (primary) hypertension: Secondary | ICD-10-CM | POA: Diagnosis not present

## 2018-08-01 DIAGNOSIS — K649 Unspecified hemorrhoids: Secondary | ICD-10-CM | POA: Insufficient documentation

## 2018-08-01 LAB — CBC WITH DIFFERENTIAL/PLATELET
Basophils Absolute: 0.2 10*3/uL — ABNORMAL HIGH (ref 0.0–0.1)
Basophils Relative: 1.4 % (ref 0.0–3.0)
Eosinophils Absolute: 0.5 10*3/uL (ref 0.0–0.7)
Eosinophils Relative: 4.1 % (ref 0.0–5.0)
HCT: 41.9 % (ref 36.0–46.0)
Hemoglobin: 14.1 g/dL (ref 12.0–15.0)
Lymphocytes Relative: 20.2 % (ref 12.0–46.0)
Lymphs Abs: 2.5 10*3/uL (ref 0.7–4.0)
MCHC: 33.6 g/dL (ref 30.0–36.0)
MCV: 92.2 fl (ref 78.0–100.0)
Monocytes Absolute: 0.9 10*3/uL (ref 0.1–1.0)
Monocytes Relative: 7.3 % (ref 3.0–12.0)
Neutro Abs: 8.1 10*3/uL — ABNORMAL HIGH (ref 1.4–7.7)
Neutrophils Relative %: 67 % (ref 43.0–77.0)
Platelets: 298 10*3/uL (ref 150.0–400.0)
RBC: 4.55 Mil/uL (ref 3.87–5.11)
RDW: 14.3 % (ref 11.5–15.5)
WBC: 12.1 10*3/uL — ABNORMAL HIGH (ref 4.0–10.5)

## 2018-08-01 LAB — COMPREHENSIVE METABOLIC PANEL
ALT: 16 U/L (ref 0–35)
AST: 9 U/L (ref 0–37)
Albumin: 4.3 g/dL (ref 3.5–5.2)
Alkaline Phosphatase: 59 U/L (ref 39–117)
BUN: 21 mg/dL (ref 6–23)
CO2: 30 mEq/L (ref 19–32)
Calcium: 9.2 mg/dL (ref 8.4–10.5)
Chloride: 104 mEq/L (ref 96–112)
Creatinine, Ser: 0.67 mg/dL (ref 0.40–1.20)
GFR: 88.99 mL/min (ref >60.00)
Glucose, Bld: 79 mg/dL (ref 70–99)
Potassium: 3.8 mEq/L (ref 3.5–5.1)
Sodium: 140 mEq/L (ref 135–145)
Total Bilirubin: 0.5 mg/dL (ref 0.2–1.2)
Total Protein: 6.4 g/dL (ref 6.0–8.3)

## 2018-08-01 LAB — LIPID PANEL
Cholesterol: 126 mg/dL (ref 0–200)
HDL: 48.7 mg/dL (ref >39.00)
LDL Cholesterol: 64 mg/dL (ref 0–99)
NonHDL: 77.65
Total CHOL/HDL Ratio: 3
Triglycerides: 70 mg/dL (ref 0.0–149.0)
VLDL: 14 mg/dL (ref 0.0–40.0)

## 2018-08-01 LAB — POC HEMOCCULT BLD/STL (OFFICE/1-CARD/DIAGNOSTIC): Fecal Occult Blood, POC: NEGATIVE

## 2018-08-01 MED ORDER — METOPROLOL SUCCINATE ER 50 MG PO TB24: 50.0000 mg | ORAL_TABLET | Freq: Every day | ORAL | 3 refills | Status: DC

## 2018-08-01 NOTE — Assessment & Plan Note (Signed)
con't per surgery Sitz,  Hemorrhoid gel Inc fiber in diet  F/u surgery if no better

## 2018-08-01 NOTE — Progress Notes (Signed)
Patient ID: Rebecca Gordon, female    DOB: Sep 09, 1955  Age: 63 y.o. MRN: [id number]    Subjective:  Subjective  HPIf/ Rebecca Gordon presents for c/o dark stools.  Pt recently had rectal surgery for hemorrhoids and fissure.  She also had TAH bso.  She developed dark stools after that.  No abd pain, no diarrhea, no fevers    Pt also need f/u chol  Review of Systems  Constitutional: Negative for appetite change, diaphoresis, fatigue and unexpected weight change.  Eyes: Negative for pain, redness and visual disturbance.  Respiratory: Negative for cough, chest tightness, shortness of breath and wheezing.   Cardiovascular: Negative for chest pain, palpitations and leg swelling.  Gastrointestinal: Negative for abdominal distention, abdominal pain, anal bleeding, blood in stool, constipation, diarrhea, nausea, rectal pain and vomiting.  Endocrine: Negative for cold intolerance, heat intolerance, polydipsia, polyphagia and polyuria.  Genitourinary: Negative for difficulty urinating, dysuria and frequency.  Neurological: Negative for dizziness, light-headedness, numbness and headaches.    History Past Medical History:  Diagnosis Date  . Anal fissure   . 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 Anxiety   . Depression   . 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 Endometrial polyp   . Former smoker 09/23/2016  . GERD (gastroesophageal reflux disease)    Nexium  . H/O clavicle fracture 2017   Right  . High cholesterol   . History of bronchitis   . History of pneumonia    years ago  . Hypertension   . LVH (left ventricular hypertrophy) 09/23/2016   Mild, noted on ECHO  . Numbness    left fingers after TIA  . OA (osteoarthritis)    "right knee"  . Osteoporosis 08/2017   T score -2.7  . Panic attacks   . Postmenopausal bleeding   . Thyroid nodule 2019   Multiple  . TIA (transient ischemic attack) 09/2016  . Wears contact lenses     She has a past  surgical history that includes Tonsillectomy; Placement of breast implants (Bilateral); Colonoscopy (2017); Eye surgery (Right); ORIF clavicular fracture (Right, 04/28/2015); Foot surgery (Right, 08/2017); Dilatation & curettage/hysteroscopy with myosure (N/A, 05/26/2018); Sphincterotomy (N/A, 06/24/2018); Hemorrhoid surgery (N/A, 06/24/2018); Clavicle surgery (Right, 2016); Knee arthroscopy (Right); Robotic assisted total hysterectomy with bilateral salpingo oophorectomy (Bilateral, 07/10/2018); and Sentinel node biopsy (N/A, 07/10/2018).   Her family history includes Brain cancer in her father; Heart failure in her mother; Hypertension in her father; Prostate cancer in her father; Skin cancer in her father.She reports that she has been smoking cigarettes. She has a 40.00 pack-year smoking history. She has never used smokeless tobacco. She reports previous alcohol use. She reports that she does not use drugs.  Current Outpatient Medications on File Prior to Visit  Medication Sig Dispense Refill  . alendronate (FOSAMAX) 70 MG tablet Take 1 tablet (70 mg total) by mouth every 7 (seven) days. Take with a full glass of water on an empty stomach. (Patient taking differently: Take 70 mg by mouth every Sunday. Take with a full glass of water on an empty stomach.) 12 tablet 4  . amLODipine (NORVASC) 10 MG tablet TAKE 1 TABLET BY MOUTH EVERY DAY (Patient taking differently: Take 10 mg by mouth daily. ) 90 tablet 3  . aspirin 325 MG tablet Take 325 mg by mouth daily.    . cholecalciferol (VITAMIN D) 1000 units tablet Take 1,000 Units by mouth every morning.     Marland Kitchen  desvenlafaxine (PRISTIQ) 50 MG 24 hr tablet Take 1 tablet (50 mg total) by mouth daily. 90 tablet 3  . esomeprazole (NEXIUM) 20 MG capsule Take 20 mg by mouth daily at 12 noon.    . fluticasone (FLONASE) 50 MCG/ACT nasal spray SPRAY 2 SPRAYS INTO EACH NOSTRIL EVERY DAY (Patient taking differently: Place 2 sprays into both nostrils daily as needed for  allergies. ) 48 g 0  . hydrochlorothiazide (MICROZIDE) 12.5 MG capsule TAKE 1 CAPSULE BY MOUTH EVERY DAY (Patient taking differently: Take 12.5 mg by mouth daily. ) 90 capsule 3  . losartan (COZAAR) 100 MG tablet TAKE 1 TABLET BY MOUTH EVERY DAY (Patient taking differently: Take 100 mg by mouth daily. ) 90 tablet 3  . polyethylene glycol powder (GLYCOLAX/MIRALAX) powder Take 17 g by mouth daily.    . Probiotic Product (ULTRAFLORA IMMUNE HEALTH PO) Take 1 capsule by mouth daily.    . rosuvastatin (CRESTOR) 40 MG tablet Take 1 tablet (40 mg total) by mouth daily. 90 tablet 1  . topiramate (TOPAMAX) 100 MG tablet Take 1 tablet (100 mg total) by mouth daily. TAKE 1 TABLET BY MOUTH EVERYDAY AT BEDTIME (Patient taking differently: Take 100 mg by mouth at bedtime. ) 90 tablet 1  . traZODone (DESYREL) 50 MG tablet Take 50 mg by mouth at bedtime as needed for sleep.   1   No current facility-administered medications on file prior to visit.      Objective:  Objective  Physical Exam Vitals signs and nursing note reviewed.  Constitutional:      General: She is not in acute distress.    Appearance: She is well-developed.  HENT:     Right Ear: External ear normal.     Left Ear: External ear normal.     Nose: Nose normal.  Eyes:     Pupils: Pupils are equal, round, and reactive to light.  Neck:     Musculoskeletal: Normal range of motion and neck supple.  Cardiovascular:     Rate and Rhythm: Normal rate and regular rhythm.     Heart sounds: Normal heart sounds. No murmur.  Pulmonary:     Effort: Pulmonary effort is normal. No respiratory distress.     Breath sounds: Normal breath sounds. No wheezing or rales.  Chest:     Chest wall: No tenderness.  Genitourinary:    Pubic Area: No rash or pubic lice.      Rectum: Guaiac result negative. Tenderness and external hemorrhoid present.  Neurological:     Mental Status: She is alert and oriented to person, place, and time.  Psychiatric:         Behavior: Behavior normal.        Thought Content: Thought content normal.        Judgment: Judgment normal.    BP (!) 116/51 (BP Location: Left Arm, Cuff Size: Normal)   Pulse 71   Temp 98.3 F (36.8 C) (Oral)   Resp 12   Ht 5\' 3"  (1.6 m)   Wt 132 lb 6.4 oz (60.1 kg)   SpO2 100%   BMI 23.45 kg/m  Wt Readings from Last 3 Encounters:  08/01/18 132 lb 6.4 oz (60.1 kg)  07/30/18 130 lb (59 kg)  07/10/18 136 lb (61.7 kg)     Lab Results  Component Value Date   WBC 11.8 (H) 07/10/2018   HGB 13.4 07/10/2018   HCT 40.2 07/10/2018   PLT 302 07/10/2018   GLUCOSE 109 (H) 06/27/2018  CHOL 117 03/24/2018   TRIG 50.0 03/24/2018   HDL 49.10 03/24/2018   LDLCALC 58 03/24/2018   ALT 17 06/27/2018   AST 13 (L) 06/27/2018   NA 139 06/27/2018   K 3.8 06/27/2018   CL 108 06/27/2018   CREATININE 0.66 06/27/2018   BUN 17 06/27/2018   CO2 24 06/27/2018   TSH 1.06 08/09/2017   INR 1.0 06/27/2018   HGBA1C 5.5 09/23/2016    No results found.   Assessment & Plan:  Plan  I have discontinued Scharlene Corn Timpone's metoprolol succinate. I am also having her start on metoprolol succinate. Additionally, I am having her maintain her cholecalciferol, desvenlafaxine, losartan, hydrochlorothiazide, amLODipine, traZODone, rosuvastatin, topiramate, fluticasone, alendronate, polyethylene glycol powder, Probiotic Product (Isabel), aspirin, and esomeprazole.  Meds ordered this encounter  Medications  . metoprolol succinate (TOPROL-XL) 50 MG 24 hr tablet    Sig: Take 1 tablet (50 mg total) by mouth daily. Take with or immediately following a meal.    Dispense:  90 tablet    Refill:  3    Problem List Items Addressed This Visit      Unprioritized   Essential hypertension    Running low--- cut toprol xl  100 mg in half---  50 mg sent to pharmacy      Relevant Medications   metoprolol succinate (TOPROL-XL) 50 MG 24 hr tablet   Other Relevant Orders   CBC with  Differential/Platelet   Lipid panel   Comprehensive metabolic panel   Hemorrhoids - Primary    con't per surgery Sitz,  Hemorrhoid gel Inc fiber in diet  F/u surgery if no better       Relevant Medications   metoprolol succinate (TOPROL-XL) 50 MG 24 hr tablet   Other Relevant Orders   CBC with Differential/Platelet   Lipid panel   Comprehensive metabolic panel   HLD (hyperlipidemia)   Relevant Medications   metoprolol succinate (TOPROL-XL) 50 MG 24 hr tablet   Other Relevant Orders   CBC with Differential/Platelet   Lipid panel   Comprehensive metabolic panel   Hyperlipidemia LDL goal <100    Tolerating statin, encouraged heart healthy diet, avoid trans fats, minimize simple carbs and saturated fats. Increase exercise as tolerated      Relevant Medications   metoprolol succinate (TOPROL-XL) 50 MG 24 hr tablet      Follow-up: Return if symptoms worsen or fail to improve.  [name], DO

## 2018-08-01 NOTE — Assessment & Plan Note (Signed)
Tolerating statin, encouraged heart healthy diet, avoid trans fats, minimize simple carbs and saturated fats. Increase exercise as tolerated 

## 2018-08-01 NOTE — Assessment & Plan Note (Signed)
Running low--- cut toprol xl  100 mg in half---  50 mg sent to pharmacy

## 2018-08-01 NOTE — Patient Instructions (Signed)

## 2018-08-01 NOTE — Addendum Note (Signed)
Addended by: Clarnce Homan D on: 08/01/2018 03:46 PM   Modules accepted: Orders  

## 2018-08-06 ENCOUNTER — Ambulatory Visit: Payer: Self-pay | Admitting: Gynecologic Oncology

## 2018-08-07 ENCOUNTER — Telehealth: Payer: Self-pay | Admitting: Family Medicine

## 2018-08-07 NOTE — Telephone Encounter (Signed)
Copied from [place] [id number]. Topic: Quick Communication - See Telephone Encounter >> Aug 07, 2018 10:20 AM Sheran Luz wrote: CRM for notification. See Telephone encounter for: 08/07/18.  Patient calling to check status of labs from 4/17.

## 2018-08-07 NOTE — Telephone Encounter (Signed)
Called patient with lab results.  

## 2018-08-08 ENCOUNTER — Telehealth: Payer: Self-pay

## 2018-08-08 NOTE — Telephone Encounter (Signed)
Copied from [place] [id number]. Topic: Referral - Status >> Aug 07, 2018 10:22 AM Rebecca Gordon wrote: Reason for CRM: Patient calling to check status of referral from 07/24/2018

## 2018-08-08 NOTE — Telephone Encounter (Signed)
Spoke with patient and notified her that order was placed but not authorized yet.  Advised that we will work on this today.

## 2018-08-11 NOTE — Telephone Encounter (Signed)
Ct scan was authorized.  She will call [facility] for appt and cost.

## 2018-08-15 ENCOUNTER — Other Ambulatory Visit: Payer: Self-pay | Admitting: Family Medicine

## 2018-08-15 NOTE — Telephone Encounter (Signed)
Copied from [place] [id number]. Topic: Quick Communication - Rx Refill/Question >> Aug 15, 2018  2:57 PM Reyne Dumas L wrote: Medication: topiramate (TOPAMAX) 100 MG tablet  Has the patient contacted their pharmacy? Yes - no refills remain (Agent: If no, request that the patient contact the pharmacy for the refill.) (Agent: If yes, when and what did the pharmacy advise?)  Preferred Pharmacy (with phone number or street name): [facility] [place], [place] - [place] [phone] (Phone) [phone] (Fax)  Agent: Please be advised that RX refills may take up to 3 business days. We ask that you follow-up with your pharmacy.

## 2018-08-18 MED ORDER — TOPIRAMATE 100 MG PO TABS: 100.0000 mg | ORAL_TABLET | Freq: Every day | ORAL | 1 refills | Status: DC

## 2018-08-18 NOTE — Telephone Encounter (Signed)
Refill sent. Notified pt.

## 2018-08-21 ENCOUNTER — Ambulatory Visit
Admission: RE | Admit: 2018-08-21 | Discharge: 2018-08-21 | Disposition: A | Discharge status: Home / Self Care | Payer: BLUE CROSS/BLUE SHIELD | Source: Ambulatory Visit | Attending: Family Medicine | Admitting: Family Medicine

## 2018-08-21 ENCOUNTER — Other Ambulatory Visit: Payer: Self-pay

## 2018-08-21 DIAGNOSIS — K921 Melena: Secondary | ICD-10-CM

## 2018-08-21 MED ORDER — IOPAMIDOL (ISOVUE-300) INJECTION 61%
100.0000 mL | Freq: Once | INTRAVENOUS | Status: AC | PRN
  Administered 2018-08-21: 100 mL via INTRAVENOUS

## 2018-08-22 ENCOUNTER — Encounter: Payer: Self-pay | Admitting: Family Medicine

## 2018-08-22 ENCOUNTER — Other Ambulatory Visit: Payer: Self-pay | Admitting: Family Medicine

## 2018-08-22 DIAGNOSIS — K921 Melena: Secondary | ICD-10-CM

## 2018-08-25 ENCOUNTER — Other Ambulatory Visit: Payer: Self-pay | Admitting: Family Medicine

## 2018-08-25 MED ORDER — ROSUVASTATIN CALCIUM 40 MG PO TABS: 40.0000 mg | ORAL_TABLET | Freq: Every day | ORAL | 1 refills | Status: DC

## 2018-08-25 NOTE — Telephone Encounter (Signed)
Copied from [place] [phone]. Topic: Quick Communication - Rx Refill/Question >> Aug 25, 2018  1:29 PM Pauline Good wrote: Medication: rosuvastatin 40 mg  Has the patient contacted their pharmacy? No (Agent: If no, request that the patient contact the pharmacy for the refill.) pt stated the pharmacy always tell her if she don't have refills to call office (Agent: If yes, when and what did the pharmacy advise?)  Preferred Pharmacy (with phone number or street name): [facility]  Agent: Please be advised that RX refills may take up to 3 business days. We ask that you follow-up with your pharmacy.

## 2018-08-25 NOTE — Telephone Encounter (Signed)
Refills sent

## 2018-08-27 DIAGNOSIS — M1711 Unilateral primary osteoarthritis, right knee: Secondary | ICD-10-CM | POA: Diagnosis not present

## 2018-08-27 DIAGNOSIS — M25561 Pain in right knee: Secondary | ICD-10-CM | POA: Diagnosis not present

## 2018-09-01 ENCOUNTER — Other Ambulatory Visit: Payer: Self-pay | Admitting: Family Medicine

## 2018-09-01 DIAGNOSIS — F418 Other specified anxiety disorders: Secondary | ICD-10-CM

## 2018-09-02 NOTE — Telephone Encounter (Signed)
Last office visit 08/01/2018 Last refill 09/06/2017  #90 with 3 refills.

## 2018-09-03 ENCOUNTER — Other Ambulatory Visit: Payer: Self-pay | Admitting: Family Medicine

## 2018-09-03 MED ORDER — TRAZODONE HCL 50 MG PO TABS: 50.0000 mg | ORAL_TABLET | Freq: Every evening | ORAL | 2 refills | Status: DC | PRN

## 2018-09-03 NOTE — Telephone Encounter (Signed)
Refill sent.

## 2018-09-03 NOTE — Telephone Encounter (Signed)
Copied from [place] [id number]. Topic: Quick Communication - Rx Refill/Question >> Sep 03, 2018 11:27 AM Sheran Luz wrote: Medication: traZODone (DESYREL) 50 MG tablet   Patient is requesting refill.   Preferred Pharmacy (with phone number or street name):[facility] [place], [place] - [place] [phone] (Phone) [phone] (Fax)

## 2018-09-22 ENCOUNTER — Telehealth: Payer: Self-pay | Admitting: Family Medicine

## 2018-09-22 NOTE — Telephone Encounter (Signed)
Please advise 

## 2018-09-22 NOTE — Telephone Encounter (Signed)
Copied from [place] [id number]. Topic: Quick Communication - Rx Refill/Question >> Sep 22, 2018 11:44 AM Reyne Dumas L wrote: Medication: Chantix starter pack  Has the patient contacted their pharmacy? no (Agent: If no, request that the patient contact the pharmacy for the refill.) (Agent: If yes, when and what did the pharmacy advise?)  Preferred Pharmacy (with phone number or street name): [facility] at [place], [place] - [place] [phone] (Phone) [phone] (Fax)  Agent: Please be advised that RX refills may take up to 3 business days. We ask that you follow-up with your pharmacy.

## 2018-09-23 ENCOUNTER — Other Ambulatory Visit: Payer: Self-pay | Admitting: Family Medicine

## 2018-09-23 DIAGNOSIS — Z720 Tobacco use: Secondary | ICD-10-CM

## 2018-09-23 MED ORDER — VARENICLINE TARTRATE 0.5 MG X 11 & 1 MG X 42 PO MISC: ORAL | 0 refills | Status: DC

## 2018-09-23 NOTE — Telephone Encounter (Signed)
Do you have this prescription?

## 2018-09-23 NOTE — Telephone Encounter (Signed)
Printed -- ok to fax

## 2018-09-24 NOTE — Telephone Encounter (Signed)
rx faxed to [facility]

## 2018-09-25 ENCOUNTER — Telehealth: Payer: Self-pay | Admitting: *Deleted

## 2018-09-25 NOTE — Telephone Encounter (Signed)
Copied from [place] [id number]. Topic: General - Other >> Sep 23, 2018 10:11 AM Alanda Slim E wrote: Reason for CRM: Pt called to speak with [facility] about medical records/ please advise

## 2018-10-02 ENCOUNTER — Ambulatory Visit (INDEPENDENT_AMBULATORY_CARE_PROVIDER_SITE_OTHER): Payer: BC Managed Care – PPO | Admitting: Family Medicine

## 2018-10-02 ENCOUNTER — Encounter: Payer: Self-pay | Admitting: Family Medicine

## 2018-10-02 ENCOUNTER — Other Ambulatory Visit: Payer: Self-pay

## 2018-10-02 VITALS — BP 112/63 | HR 83 | Ht 63.0 in | Wt 128.5 lb

## 2018-10-02 DIAGNOSIS — F418 Other specified anxiety disorders: Secondary | ICD-10-CM

## 2018-10-02 DIAGNOSIS — Z1159 Encounter for screening for other viral diseases: Secondary | ICD-10-CM

## 2018-10-02 DIAGNOSIS — Z720 Tobacco use: Secondary | ICD-10-CM

## 2018-10-02 DIAGNOSIS — I10 Essential (primary) hypertension: Secondary | ICD-10-CM

## 2018-10-02 DIAGNOSIS — E785 Hyperlipidemia, unspecified: Secondary | ICD-10-CM

## 2018-10-02 DIAGNOSIS — F17200 Nicotine dependence, unspecified, uncomplicated: Secondary | ICD-10-CM

## 2018-10-02 DIAGNOSIS — D72829 Elevated white blood cell count, unspecified: Secondary | ICD-10-CM

## 2018-10-02 DIAGNOSIS — M1711 Unilateral primary osteoarthritis, right knee: Secondary | ICD-10-CM | POA: Diagnosis not present

## 2018-10-02 DIAGNOSIS — F1021 Alcohol dependence, in remission: Secondary | ICD-10-CM

## 2018-10-02 DIAGNOSIS — Z0000 Encounter for general adult medical examination without abnormal findings: Secondary | ICD-10-CM | POA: Diagnosis not present

## 2018-10-02 MED ORDER — DESVENLAFAXINE SUCCINATE ER 50 MG PO TB24: 50.0000 mg | ORAL_TABLET | Freq: Every day | ORAL | 0 refills | Status: DC

## 2018-10-02 MED ORDER — HYDROCHLOROTHIAZIDE 12.5 MG PO CAPS: 12.5000 mg | ORAL_CAPSULE | Freq: Every day | ORAL | 3 refills | Status: DC

## 2018-10-02 MED ORDER — CHANTIX STARTING MONTH PAK 0.5 MG X 11 & 1 MG X 42 PO TABS: ORAL_TABLET | ORAL | 0 refills | Status: DC

## 2018-10-02 MED ORDER — AMLODIPINE BESYLATE 10 MG PO TABS: 10.0000 mg | ORAL_TABLET | Freq: Every day | ORAL | 3 refills | Status: DC

## 2018-10-02 NOTE — Assessment & Plan Note (Signed)
Inc pristiq to 100 mg daily  Pt doing well -- she is just feeling a little down

## 2018-10-02 NOTE — Assessment & Plan Note (Signed)
ghm utd Check labs  

## 2018-10-02 NOTE — Assessment & Plan Note (Signed)
chantix per orders

## 2018-10-02 NOTE — Assessment & Plan Note (Signed)
Doing well 

## 2018-10-02 NOTE — Progress Notes (Signed)
Virtual Visit via Video Note  I connected with Rebecca Gordon on 10/02/18 at  8:30 AM EDT by a video enabled telemedicine application and verified that I am speaking with the correct person using two identifiers.  Location: Patient: home  Provider: office    I discussed the limitations of evaluation and management by telemedicine and the availability of in person appointments. The patient expressed understanding and agreed to proceed.  History of Present Illness: Pt is home and needs f/u and cpe.  Pt with no complaints She would like to stop smoking and would like chantix again.     Past Medical History:  Diagnosis Date  . Anal fissure   . 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 Anxiety   . Depression   . 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 Endometrial polyp   . Former smoker 09/23/2016  . GERD (gastroesophageal reflux disease)    Nexium  . H/O clavicle fracture 2017   Right  . High cholesterol   . History of bronchitis   . History of pneumonia    years ago  . Hypertension   . LVH (left ventricular hypertrophy) 09/23/2016   Mild, noted on ECHO  . Numbness    left fingers after TIA  . OA (osteoarthritis)    "right knee"  . Osteoporosis 08/2017   T score -2.7  . Panic attacks   . Postmenopausal bleeding   . Thyroid nodule 2019   Multiple  . TIA (transient ischemic attack) 09/2016  . Wears contact lenses    Current Outpatient Medications on File Prior to Visit  Medication Sig Dispense Refill  . alendronate (FOSAMAX) 70 MG tablet Take 1 tablet (70 mg total) by mouth every 7 (seven) days. Take with a full glass of water on an empty stomach. (Patient taking differently: Take 70 mg by mouth every Sunday. Take with a full glass of water on an empty stomach.) 12 tablet 4  . aspirin 325 MG tablet Take 325 mg by mouth daily.    . cholecalciferol (VITAMIN D) 1000 units tablet Take 1,000 Units by mouth every morning.     Marland Kitchen esomeprazole  (NEXIUM) 20 MG capsule Take 20 mg by mouth daily at 12 noon.    . fluticasone (FLONASE) 50 MCG/ACT nasal spray SPRAY 2 SPRAYS INTO EACH NOSTRIL EVERY DAY (Patient taking differently: Place 2 sprays into both nostrils daily as needed for allergies. ) 48 g 0  . losartan (COZAAR) 100 MG tablet TAKE 1 TABLET BY MOUTH EVERY DAY (Patient taking differently: Take 100 mg by mouth daily. ) 90 tablet 3  . metoprolol succinate (TOPROL-XL) 50 MG 24 hr tablet Take 1 tablet (50 mg total) by mouth daily. Take with or immediately following a meal. 90 tablet 3  . polyethylene glycol powder (GLYCOLAX/MIRALAX) powder Take 17 g by mouth daily.    . Probiotic Product (ULTRAFLORA IMMUNE HEALTH PO) Take 1 capsule by mouth daily.    . rosuvastatin (CRESTOR) 40 MG tablet Take 1 tablet (40 mg total) by mouth daily. 90 tablet 1  . topiramate (TOPAMAX) 100 MG tablet Take 1 tablet (100 mg total) by mouth daily. TAKE 1 TABLET BY MOUTH EVERYDAY AT BEDTIME 90 tablet 1  . traZODone (DESYREL) 50 MG tablet Take 1 tablet (50 mg total) by mouth at bedtime as needed for sleep. 30 tablet 2   No current facility-administered medications on file prior to visit.  Social History   Socioeconomic History  . Marital status: Married    Spouse name: Not on file  . Number of children: 2  . Years of education: College  . Highest education level: Not on file  Occupational History  . Occupation: Unemployed  Social Needs  . Financial resource strain: Not on file  . Food insecurity    Worry: Not on file    Inability: Not on file  . Transportation needs    Medical: Not on file    Non-medical: Not on file  Tobacco Use  . Smoking status: Current Every Day Smoker    Packs/day: 1.00    Years: 40.00    Pack years: 40.00    Types: Cigarettes  . Smokeless tobacco: Never Used  . Tobacco comment: Previous 1 pack a day  Substance and Sexual Activity  . Alcohol use: Not Currently    Comment: daily - 1 bottle of wine--- 09/23/2016, 3-4  glasses--- 10/15/16  . Drug use: No  . Sexual activity: Yes    Birth control/protection: Post-menopausal    Comment: 1st intercourse- 12, partners- 67, married- 67 yrs   Lifestyle  . Physical activity    Days per week: Not on file    Minutes per session: Not on file  . Stress: Not on file  Relationships  . Social Herbalist on phone: Not on file    Gets together: Not on file    Attends religious service: Not on file    Active member of club or organization: Not on file    Attends meetings of clubs or organizations: Not on file    Relationship status: Not on file  . Intimate partner violence    Fear of current or ex partner: Not on file    Emotionally abused: Not on file    Physically abused: Not on file    Forced sexual activity: Not on file  Other Topics Concern  . Not on file  Social History Narrative   Lives at home w/ her husband   Right-handed   Caffeine: 2-3 cups per day   Observations/Objective: Vitals:   10/02/18 0824  BP: 112/63  Pulse: 83   Pt is in NAD Pt c/o mild depression but is not suicidal  Pt see gyn for pap , mammo and bmd  Assessment and Plan: 1. Essential hypertension Well controlled, no changes to meds. Encouraged heart healthy diet such as the DASH diet and exercise as tolerated.   - hydrochlorothiazide (MICROZIDE) 12.5 MG capsule; Take 1 capsule (12.5 mg total) by mouth daily.  Dispense: 90 capsule; Refill: 3 - amLODipine (NORVASC) 10 MG tablet; Take 1 tablet (10 mg total) by mouth daily.  Dispense: 90 tablet; Refill: 3  2. Tobacco abuse Will start chantix again Pt motivated to stop - varenicline (CHANTIX STARTING MONTH PAK) 0.5 MG X 11 & 1 MG X 42 tablet; Take one 0.5 mg tablet by mouth once daily for 3 days, then increase to one 0.5 mg tablet twice daily for 4 days, then increase to one 1 mg tablet twice daily.  Dispense: 53 tablet; Refill: 0  3. Leukocytosis, unspecified type Recheck labs No signs of infection  - CBC with  Differential/Platelet  4. Need for hepatitis C screening test   - Hepatitis C antibody  5. Depression with anxiety Inc pristiq to 100 mg dail F/u 3-6 months or sooner prn  - desvenlafaxine (PRISTIQ) 50 MG 24 hr tablet; Take 1 tablet (50 mg total)  by mouth daily. Pt will increase 2 a day and we can refill 100 mg -- she will let us know  Dispense: 90 tablet; Refill: 0  6. Alcoholism in remission ([place]) Pt still not drinking Doing well   7. Current smoker See above   8. Preventative health care ghm utd Check labs   9. Hyperlipidemia LDL goal <100 Tolerating statin, encouraged heart healthy diet, avoid trans fats, minimize simple carbs and saturated fats. Increase exercise as tolerated Labs reviewed from April  Follow Up Instructions:    I discussed the assessment and treatment plan with the patient. The patient was provided an opportunity to ask questions and all were answered. The patient agreed with the plan and demonstrated an understanding of the instructions.   The patient was advised to call back or seek an in-person evaluation if the symptoms worsen or if the condition fails to improve as anticipated.  I provided 30 minutes of non-face-to-face time during this encounter.   [name], DO

## 2018-10-03 ENCOUNTER — Telehealth: Payer: Self-pay | Admitting: Gastroenterology

## 2018-10-03 ENCOUNTER — Other Ambulatory Visit: Payer: Self-pay | Admitting: Family Medicine

## 2018-10-03 DIAGNOSIS — I10 Essential (primary) hypertension: Secondary | ICD-10-CM

## 2018-10-03 NOTE — Telephone Encounter (Signed)
Patient is requesting to be seen by Dr. Silverio Decamp. We have received her [facility] notes and will be sent to the doctor to be reviewed. Dr. Silverio Decamp please advise for scheduling

## 2018-10-22 DIAGNOSIS — M1711 Unilateral primary osteoarthritis, right knee: Secondary | ICD-10-CM | POA: Diagnosis not present

## 2018-11-05 ENCOUNTER — Telehealth: Payer: Self-pay | Admitting: Family Medicine

## 2018-11-05 NOTE — Telephone Encounter (Signed)
Pt just finished her Rx for varenicline (CHANTIX STARTING MONTH PAK) 0.5 MG X 11 & 1 MG X 42 tablet And now needs a Rx for the continuing pack sent to  [facility] at [place], [place] [phone] (Phone) [phone] (Fax)   please advise

## 2018-11-05 NOTE — Telephone Encounter (Signed)
Please advise 

## 2018-11-06 ENCOUNTER — Other Ambulatory Visit: Payer: Self-pay | Admitting: Family Medicine

## 2018-11-06 DIAGNOSIS — F17200 Nicotine dependence, unspecified, uncomplicated: Secondary | ICD-10-CM

## 2018-11-06 MED ORDER — VARENICLINE TARTRATE 1 MG PO TABS: 1.0000 mg | ORAL_TABLET | Freq: Two times a day (BID) | ORAL | 2 refills | Status: DC

## 2018-11-06 NOTE — Telephone Encounter (Signed)
Sent in

## 2018-11-14 ENCOUNTER — Encounter: Payer: Self-pay | Admitting: Gastroenterology

## 2018-11-21 ENCOUNTER — Other Ambulatory Visit: Payer: Self-pay | Admitting: Family Medicine

## 2018-11-21 DIAGNOSIS — F418 Other specified anxiety disorders: Secondary | ICD-10-CM

## 2018-12-04 ENCOUNTER — Other Ambulatory Visit: Payer: Self-pay

## 2018-12-04 ENCOUNTER — Telehealth: Payer: Self-pay | Admitting: *Deleted

## 2018-12-04 DIAGNOSIS — Z20822 Contact with and (suspected) exposure to covid-19: Secondary | ICD-10-CM

## 2018-12-04 NOTE — Telephone Encounter (Signed)
It looks like the [facility] may have already placed th COVID order because it was already in [place] when I routed the note to you. I think it was just an [place].

## 2018-12-04 NOTE — Telephone Encounter (Signed)
Does she want [place] to put order in for womens'?

## 2018-12-04 NOTE — Telephone Encounter (Signed)
Just an FYI

## 2018-12-04 NOTE — Telephone Encounter (Signed)
Copied from [place] [id number]. Topic: General - Other >> Dec 04, 2018  9:46 AM Leward Quan A wrote: Reason for CRM: Patient called to inform Dr [name] that she will be going to get covid tested because of the way that she have been feeling, headache, tiredness and was very ill on 12/03/2018 but not sure if it was caused by some antibiotics that she received from the dentist. She stopped taking the medication but to have a clear mind set she is going to get covid tested on today 12/04/2018.

## 2018-12-05 LAB — NOVEL CORONAVIRUS, NAA: SARS-CoV-2, NAA: DETECTED — AB

## 2018-12-15 ENCOUNTER — Ambulatory Visit: Payer: BC Managed Care – PPO | Admitting: Gastroenterology

## 2018-12-28 ENCOUNTER — Other Ambulatory Visit: Payer: Self-pay | Admitting: Family Medicine

## 2018-12-28 DIAGNOSIS — I10 Essential (primary) hypertension: Secondary | ICD-10-CM

## 2018-12-31 ENCOUNTER — Other Ambulatory Visit: Payer: Self-pay | Admitting: Family Medicine

## 2018-12-31 ENCOUNTER — Telehealth: Payer: Self-pay

## 2018-12-31 DIAGNOSIS — Z20828 Contact with and (suspected) exposure to other viral communicable diseases: Secondary | ICD-10-CM

## 2018-12-31 DIAGNOSIS — Z20822 Contact with and (suspected) exposure to covid-19: Secondary | ICD-10-CM

## 2018-12-31 NOTE — Telephone Encounter (Signed)
Copied from [place] [phone]. Topic: General - Inquiry >> Dec 31, 2018 11:39 AM Virl Axe D wrote: Reason for CRM: Pt requesting to have Covid-19 antibody. Please advise. CB#[phone] vm okay

## 2018-12-31 NOTE — Telephone Encounter (Signed)
Order in.

## 2019-01-02 NOTE — Telephone Encounter (Signed)
Pt was called and given information.  

## 2019-01-05 ENCOUNTER — Encounter: Payer: Self-pay | Admitting: Family Medicine

## 2019-01-05 ENCOUNTER — Other Ambulatory Visit: Payer: BC Managed Care – PPO

## 2019-01-05 DIAGNOSIS — Z20828 Contact with and (suspected) exposure to other viral communicable diseases: Secondary | ICD-10-CM

## 2019-01-05 DIAGNOSIS — Z20822 Contact with and (suspected) exposure to covid-19: Secondary | ICD-10-CM

## 2019-01-05 LAB — SARS-COV-2 IGG: SARS-COV-2 IgG: 0.02

## 2019-01-05 NOTE — Telephone Encounter (Signed)
That is odd It could very well be that you had a false positive test

## 2019-01-08 ENCOUNTER — Other Ambulatory Visit: Payer: Self-pay | Admitting: Family Medicine

## 2019-01-08 DIAGNOSIS — Z20822 Contact with and (suspected) exposure to covid-19: Secondary | ICD-10-CM

## 2019-01-08 DIAGNOSIS — Z20828 Contact with and (suspected) exposure to other viral communicable diseases: Secondary | ICD-10-CM

## 2019-01-12 ENCOUNTER — Other Ambulatory Visit: Payer: Self-pay

## 2019-01-12 ENCOUNTER — Ambulatory Visit: Payer: BC Managed Care – PPO | Admitting: Family Medicine

## 2019-01-13 ENCOUNTER — Encounter: Payer: Self-pay | Admitting: Gynecology

## 2019-01-13 ENCOUNTER — Encounter: Payer: Self-pay | Admitting: Family Medicine

## 2019-01-13 ENCOUNTER — Other Ambulatory Visit: Payer: Self-pay

## 2019-01-13 ENCOUNTER — Ambulatory Visit: Payer: BC Managed Care – PPO | Admitting: Family Medicine

## 2019-01-13 VITALS — BP 104/60 | HR 66 | Temp 97.9°F | Resp 18 | Ht 63.0 in | Wt 135.0 lb

## 2019-01-13 DIAGNOSIS — J302 Other seasonal allergic rhinitis: Secondary | ICD-10-CM | POA: Diagnosis not present

## 2019-01-13 DIAGNOSIS — F17200 Nicotine dependence, unspecified, uncomplicated: Secondary | ICD-10-CM

## 2019-01-13 DIAGNOSIS — R21 Rash and other nonspecific skin eruption: Secondary | ICD-10-CM | POA: Diagnosis not present

## 2019-01-13 MED ORDER — TRIAMCINOLONE ACETONIDE 0.1 % EX CREA: 1.0000 "application " | TOPICAL_CREAM | Freq: Two times a day (BID) | CUTANEOUS | 0 refills | Status: DC

## 2019-01-13 MED ORDER — VARENICLINE TARTRATE 1 MG PO TABS: 1.0000 mg | ORAL_TABLET | Freq: Two times a day (BID) | ORAL | 2 refills | Status: DC

## 2019-01-13 MED ORDER — FLUTICASONE PROPIONATE 50 MCG/ACT NA SUSP: 2.0000 | Freq: Every day | NASAL | 5 refills | Status: DC | PRN

## 2019-01-13 MED ORDER — VALACYCLOVIR HCL 1 G PO TABS: 1000.0000 mg | ORAL_TABLET | Freq: Three times a day (TID) | ORAL | 0 refills | Status: DC

## 2019-01-13 NOTE — Patient Instructions (Signed)
Rash, Adult A rash is a change in the color of your skin. A rash can also change the way your skin feels. There are many different conditions and factors that can cause a rash. Some rashes may disappear after a few days, but some may last for a few weeks. Common causes of rashes include:  Viral infections, such as: ? Colds. ? Measles. ? Hand, foot, and mouth disease.  Bacterial infections, such as: ? Scarlet fever. ? Impetigo.  Fungal infections, such as Candida.  Allergic reactions to food, medicines, or skin care products. Follow these instructions at home: The goal of treatment is to stop the itching and keep the rash from spreading. Pay attention to any changes in your symptoms. Follow these instructions to help with your condition: Medicine Take or apply over-the-counter and prescription medicines only as told by your health care provider. These may include:  Corticosteroid creams to treat red or swollen skin.  Anti-itch lotions.  Oral allergy medicines (antihistamines).  Oral corticosteroids for severe symptoms.  Skin care  Apply cool compresses to the affected areas.  [place] not scratch or rub your skin.  Avoid covering the rash. Make sure the rash is exposed to air as much as possible. Managing itching and discomfort  Avoid hot showers or baths, which can make itching worse. A cold shower may help.  Try taking a bath with: ? Epsom salts. Follow manufacturer instructions on the packaging. You can get these at your local pharmacy or grocery store. ? Baking soda. Pour a small amount into the bath as told by your health care provider. ? Colloidal oatmeal. Follow manufacturer instructions on the packaging. You can get this at your local pharmacy or grocery store.  Try applying baking soda paste to your skin. Stir water into baking soda until it reaches a paste-like consistency.  Try applying calamine lotion. This is an over-the-counter lotion that helps to relieve  itchiness.  Keep cool and out of the sun. Sweating and being hot can make itching worse. General instructions   Rest as needed.  Drink enough fluid to keep your urine pale yellow.  Wear loose-fitting clothing.  Avoid scented soaps, detergents, and perfumes. Use gentle soaps, detergents, perfumes, and other cosmetic products.  Avoid any substance that causes your rash. Keep a journal to help track what causes your rash. Write down: ? What you eat. ? What cosmetic products you use. ? What you drink. ? What you wear. This includes jewelry.  Keep all follow-up visits as told by your health care provider. This is important. Contact a health care provider if:  You sweat at night.  You lose weight.  You urinate more than normal.  You urinate less than normal, or you notice that your urine is a darker color than usual.  You feel weak.  You vomit.  Your skin or the whites of your eyes look yellow (jaundice).  Your skin: ? Tingles. ? Is numb.  Your rash: ? Does not go away after several days. ? Gets worse.  You are: ? Unusually thirsty. ? More tired than normal.  You have: ? New symptoms. ? Pain in your abdomen. ? A fever. ? Diarrhea. Get help right away if you:  Have a fever and your symptoms suddenly get worse.  Develop confusion.  Have a severe headache or a stiff neck.  Have severe joint pains or stiffness.  Have a seizure.  Develop a rash that covers all or most of your body. The rash may   or may not be painful.  Develop blisters that: ? Are on top of the rash. ? Grow larger or grow together. ? Are painful. ? Are inside your nose or mouth.  Develop a rash that: ? Looks like purple pinprick-sized spots all over your body. ? Has a "bull's eye" or looks like a target. ? Is not related to sun exposure, is red and painful, and causes your skin to peel. Summary  A rash is a change in the color of your skin. Some rashes disappear after a few days,  but some may last for a few weeks.  The goal of treatment is to stop the itching and keep the rash from spreading.  Take or apply over-the-counter and prescription medicines only as told by your health care provider.  Contact a health care provider if you have new or worsening symptoms.  Keep all follow-up visits as told by your health care provider. This is important. This information is not intended to replace advice given to you by your health care provider. Make sure you discuss any questions you have with your health care provider. Document Released: 03/23/2002 Document Revised: 07/25/2018 Document Reviewed: 11/04/2017 Elsevier Patient Education  2020 [employer]  

## 2019-01-13 NOTE — Assessment & Plan Note (Signed)
Not typical presentation for shingles but hx from pt is consistent with it Valtrex per orders Triamcinolone cream for rash / itching

## 2019-01-13 NOTE — Assessment & Plan Note (Signed)
Stable con't flonase

## 2019-01-13 NOTE — Progress Notes (Signed)
Patient ID: Rebecca Gordon, female    DOB: 05-16-1955  Age: 63 y.o. MRN: [id number]    Subjective:  Subjective  HPI Rebecca Gordon presents for rash on back that started with a burning pain and then she notice a blister yesterday and more today.  She had shingrix last year  Review of Systems  Constitutional: Negative for activity change, appetite change, fatigue and unexpected weight change.  Respiratory: Negative for cough and shortness of breath.   Cardiovascular: Negative for chest pain and palpitations.  Skin: Positive for rash.  Psychiatric/Behavioral: Negative for behavioral problems and dysphoric mood. The patient is not nervous/anxious.     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   Anxiety    Depression    Endometrial cancer ([facility])    Grade I   Endometrial polyp    Former smoker 09/23/2016   GERD (gastroesophageal reflux disease)    Nexium   H/O clavicle fracture 2017   Right   High cholesterol    History of bronchitis    History of pneumonia    years ago   Hypertension    LVH (left ventricular hypertrophy) 09/23/2016   Mild, noted on ECHO   Numbness    left fingers after TIA   OA (osteoarthritis)    "right knee"   Osteoporosis 08/2017   T score -2.7   Panic attacks    Postmenopausal bleeding    Thyroid nodule 2019   Multiple   TIA (transient ischemic attack) 09/2016   Wears contact lenses     She has a past surgical history that includes Tonsillectomy; Placement of breast implants (Bilateral); Colonoscopy (2017); Eye surgery (Right); ORIF clavicular fracture (Right, 04/28/2015); Foot surgery (Right, 08/2017); Dilatation & curettage/hysteroscopy with myosure (N/A, 05/26/2018); Sphincterotomy (N/A, 06/24/2018); Hemorrhoid surgery (N/A, 06/24/2018); Clavicle surgery (Right, 2016); Knee arthroscopy (Right); Robotic assisted total hysterectomy with  bilateral salpingo oophorectomy (Bilateral, 07/10/2018); and Sentinel node biopsy (N/A, 07/10/2018).   Her family history includes Brain cancer in her father; Heart failure in her mother; Hypertension in her father; Prostate cancer in her father; Skin cancer in her father.She reports that she has been smoking cigarettes. She has a 40.00 pack-year smoking history. She has never used smokeless tobacco. She reports previous alcohol use. She reports that she does not use drugs.  Current Outpatient Medications on File Prior to Visit  Medication Sig Dispense Refill   alendronate (FOSAMAX) 70 MG tablet Take 1 tablet (70 mg total) by mouth every 7 (seven) days. Take with a full glass of water on an empty stomach. (Patient taking differently: Take 70 mg by mouth every Sunday. Take with a full glass of water on an empty stomach.) 12 tablet 4   amLODipine (NORVASC) 10 MG tablet Take 1 tablet (10 mg total) by mouth daily. 90 tablet 3   aspirin 325 MG tablet Take 325 mg by mouth daily.     cholecalciferol (VITAMIN D) 1000 units tablet Take 1,000 Units by mouth every morning.      desvenlafaxine (PRISTIQ) 50 MG 24 hr tablet TAKE ONE TABLET BY MOUTH DAILY 90 tablet 0   esomeprazole (NEXIUM) 20 MG capsule Take 20 mg by mouth daily at 12 noon.     hydrochlorothiazide (MICROZIDE) 12.5 MG capsule Take 1 capsule (12.5 mg total) by mouth daily. 90 capsule 3   losartan (COZAAR) 100 MG tablet TAKE ONE TABLET  BY MOUTH DAILY 90 tablet 1   metoprolol succinate (TOPROL-XL) 50 MG 24 hr tablet Take 1 tablet (50 mg total) by mouth daily. Take with or immediately following a meal. 90 tablet 3   polyethylene glycol powder (GLYCOLAX/MIRALAX) powder Take 17 g by mouth daily.     Probiotic Product (ULTRAFLORA IMMUNE HEALTH PO) Take 1 capsule by mouth daily.     rosuvastatin (CRESTOR) 40 MG tablet Take 1 tablet (40 mg total) by mouth daily. 90 tablet 1   topiramate (TOPAMAX) 100 MG tablet Take 1 tablet (100 mg total) by  mouth daily. TAKE 1 TABLET BY MOUTH EVERYDAY AT BEDTIME 90 tablet 1   traZODone (DESYREL) 50 MG tablet Take 1 tablet (50 mg total) by mouth at bedtime as needed for sleep. 30 tablet 2   No current facility-administered medications on file prior to visit.      Objective:  Objective  Physical Exam Vitals signs and nursing note reviewed.  Constitutional:      Appearance: She is well-developed.  HENT:     Head: Normocephalic and atraumatic.  Eyes:     Conjunctiva/sclera: Conjunctivae normal.  Neck:     Musculoskeletal: Normal range of motion and neck supple.     Thyroid: No thyromegaly.     Vascular: No carotid bruit or JVD.  Cardiovascular:     Rate and Rhythm: Normal rate and regular rhythm.     Heart sounds: Normal heart sounds. No murmur.  Pulmonary:     Effort: Pulmonary effort is normal. No respiratory distress.     Breath sounds: Normal breath sounds. No wheezing or rales.  Chest:     Chest wall: No tenderness.  Abdominal:     General: There is no distension.     Palpations: Abdomen is soft.     Tenderness: There is no abdominal tenderness. There is no guarding or rebound.  Genitourinary:    Exam position: Supine.     Labia:        Right: No rash, tenderness, lesion or injury.        Left: No rash, tenderness, lesion or injury.      Vagina: No erythema or tenderness.  Skin:    Findings: Rash present. Rash is vesicular.       Neurological:     Mental Status: She is alert and oriented to person, place, and time.    BP 104/60 (BP Location: Right Arm, Patient Position: Sitting, Cuff Size: Normal)    Pulse 66    Temp 97.9 F (36.6 C) (Temporal)    Resp 18    Ht 5\' 3"  (1.6 m)    Wt 135 lb (61.2 kg)    SpO2 97%    BMI 23.91 kg/m  Wt Readings from Last 3 Encounters:  01/13/19 135 lb (61.2 kg)  10/02/18 128 lb 8 oz (58.3 kg)  08/01/18 132 lb 6.4 oz (60.1 kg)     Lab Results  Component Value Date   WBC 12.1 (H) 08/01/2018   HGB 14.1 08/01/2018   HCT 41.9  08/01/2018   PLT 298.0 08/01/2018   GLUCOSE 79 08/01/2018   CHOL 126 08/01/2018   TRIG 70.0 08/01/2018   HDL 48.70 08/01/2018   LDLCALC 64 08/01/2018   ALT 16 08/01/2018   AST 9 08/01/2018   NA 140 08/01/2018   K 3.8 08/01/2018   CL 104 08/01/2018   CREATININE 0.67 08/01/2018   BUN 21 08/01/2018   CO2 30 08/01/2018   TSH 1.06 08/09/2017  INR 1.0 06/27/2018   HGBA1C 5.5 09/23/2016    Ct Abdomen Pelvis W Contrast  Result Date: 08/21/2018 CLINICAL DATA:  Upper GI bleed, melena EXAM: CT ABDOMEN AND PELVIS WITH CONTRAST TECHNIQUE: Multidetector CT imaging of the abdomen and pelvis was performed using the standard protocol following bolus administration of intravenous contrast. CONTRAST:  146mL ISOVUE-300 IOPAMIDOL (ISOVUE-300) INJECTION 61% oral enteric contrast COMPARISON:  None. FINDINGS: Lower chest: No acute abnormality. Hepatobiliary: No focal liver abnormality is seen. No gallstones, gallbladder wall thickening, or biliary dilatation. Pancreas: Unremarkable. No pancreatic ductal dilatation or surrounding inflammatory changes. Spleen: Normal in size without focal abnormality. Adrenals/Urinary Tract: Adrenal glands are unremarkable. Kidneys are normal, without renal calculi, focal lesion, or hydronephrosis. Bladder is unremarkable. Stomach/Bowel: Stomach is within normal limits. Appendix appears normal. No evidence of bowel wall thickening, distention, or inflammatory changes. Sigmoid diverticulosis. Large burden of stool in the colon. Vascular/Lymphatic: Calcific atherosclerosis. No enlarged abdominal or pelvic lymph nodes. Reproductive: Status post hysterectomy. Other: No abdominal wall hernia or abnormality. No abdominopelvic ascites. Musculoskeletal: No acute or significant osseous findings. IMPRESSION: 1. No CT findings of the abdomen or pelvis to localize upper GI bleeding. Presence of oral enteric contrast in the bowel limits examination. 2.  Chronic and incidental findings as detailed  above. Electronically Signed   By: Eddie Candle M.D.   On: 08/21/2018 14:14     Assessment & Plan:  Plan  I have discontinued Rebecca Gordon's Chantix Starting [employer]. I have also changed her fluticasone. Additionally, I am having her start on valACYclovir and triamcinolone cream. Lastly, I am having her maintain her cholecalciferol, alendronate, polyethylene glycol powder, Probiotic Product ([place]), aspirin, esomeprazole, metoprolol succinate, topiramate, rosuvastatin, traZODone, hydrochlorothiazide, amLODipine, desvenlafaxine, losartan, and varenicline.  Meds ordered this encounter  Medications   varenicline (CHANTIX CONTINUING MONTH PAK) 1 MG tablet    Sig: Take 1 tablet (1 mg total) by mouth 2 (two) times daily.    Dispense:  60 tablet    Refill:  2   fluticasone (FLONASE) 50 MCG/ACT nasal spray    Sig: Place 2 sprays into both nostrils daily as needed for allergies.    Dispense:  16 g    Refill:  5   valACYclovir (VALTREX) 1000 MG tablet    Sig: Take 1 tablet (1,000 mg total) by mouth 3 (three) times daily.    Dispense:  21 tablet    Refill:  0   triamcinolone cream (KENALOG) 0.1 %    Sig: Apply 1 application topically 2 (two) times daily.    Dispense:  30 g    Refill:  0    Problem List Items Addressed This Visit      Unprioritized   Rash - Primary    Not typical presentation for shingles but hx from pt is consistent with it Valtrex per orders Triamcinolone cream for rash / itching       Relevant Medications   valACYclovir (VALTREX) 1000 MG tablet   triamcinolone cream (KENALOG) 0.1 %   Seasonal allergies    Stable con't flonase      Relevant Medications   fluticasone (FLONASE) 50 MCG/ACT nasal spray    Other Visit Diagnoses    Smoking       Relevant Medications   varenicline (CHANTIX CONTINUING MONTH PAK) 1 MG tablet      Follow-up: Return if symptoms worsen or fail to improve.  [name], DO

## 2019-01-15 HISTORY — PX: OTHER SURGICAL HISTORY: SHX169

## 2019-01-16 ENCOUNTER — Other Ambulatory Visit: Payer: Self-pay | Admitting: Family Medicine

## 2019-01-16 ENCOUNTER — Ambulatory Visit: Payer: BC Managed Care – PPO | Admitting: Gastroenterology

## 2019-01-16 DIAGNOSIS — R21 Rash and other nonspecific skin eruption: Secondary | ICD-10-CM

## 2019-01-19 ENCOUNTER — Other Ambulatory Visit: Payer: Self-pay | Admitting: Family Medicine

## 2019-01-19 DIAGNOSIS — F418 Other specified anxiety disorders: Secondary | ICD-10-CM

## 2019-01-20 ENCOUNTER — Other Ambulatory Visit: Payer: Self-pay | Admitting: Family Medicine

## 2019-02-04 ENCOUNTER — Other Ambulatory Visit (INDEPENDENT_AMBULATORY_CARE_PROVIDER_SITE_OTHER): Payer: BC Managed Care – PPO

## 2019-02-04 DIAGNOSIS — D72829 Elevated white blood cell count, unspecified: Secondary | ICD-10-CM | POA: Diagnosis not present

## 2019-02-04 DIAGNOSIS — Z1159 Encounter for screening for other viral diseases: Secondary | ICD-10-CM

## 2019-02-04 LAB — CBC WITH DIFFERENTIAL/PLATELET
Basophils Absolute: 0.1 10*3/uL (ref 0.0–0.1)
Basophils Relative: 1.1 % (ref 0.0–3.0)
Eosinophils Absolute: 0.3 10*3/uL (ref 0.0–0.7)
Eosinophils Relative: 2.8 % (ref 0.0–5.0)
HCT: 39.1 % (ref 36.0–46.0)
Hemoglobin: 13.1 g/dL (ref 12.0–15.0)
Lymphocytes Relative: 28.7 % (ref 12.0–46.0)
Lymphs Abs: 3.1 10*3/uL (ref 0.7–4.0)
MCHC: 33.6 g/dL (ref 30.0–36.0)
MCV: 94.3 fl (ref 78.0–100.0)
Monocytes Absolute: 1.2 10*3/uL — ABNORMAL HIGH (ref 0.1–1.0)
Monocytes Relative: 11 % (ref 3.0–12.0)
Neutro Abs: 6.1 10*3/uL (ref 1.4–7.7)
Neutrophils Relative %: 56.4 % (ref 43.0–77.0)
Platelets: 303 10*3/uL (ref 150.0–400.0)
RBC: 4.14 Mil/uL (ref 3.87–5.11)
RDW: 14.3 % (ref 11.5–15.5)
WBC: 10.9 10*3/uL — ABNORMAL HIGH (ref 4.0–10.5)

## 2019-02-05 LAB — HEPATITIS C ANTIBODY
Hepatitis C Ab: NONREACTIVE
SIGNAL TO CUT-OFF: 0.01 (ref <1.00)

## 2019-02-10 ENCOUNTER — Other Ambulatory Visit: Payer: Self-pay

## 2019-02-10 ENCOUNTER — Inpatient Hospital Stay: Payer: BC Managed Care – PPO | Attending: Gynecologic Oncology | Admitting: Gynecologic Oncology

## 2019-02-10 ENCOUNTER — Encounter: Payer: Self-pay | Admitting: Gynecologic Oncology

## 2019-02-10 VITALS — BP 109/52 | HR 77 | Temp 98.7°F | Resp 16 | Ht 63.5 in | Wt 137.3 lb

## 2019-02-10 DIAGNOSIS — Z90722 Acquired absence of ovaries, bilateral: Secondary | ICD-10-CM | POA: Diagnosis not present

## 2019-02-10 DIAGNOSIS — K219 Gastro-esophageal reflux disease without esophagitis: Secondary | ICD-10-CM | POA: Diagnosis not present

## 2019-02-10 DIAGNOSIS — K602 Anal fissure, unspecified: Secondary | ICD-10-CM | POA: Insufficient documentation

## 2019-02-10 DIAGNOSIS — M1711 Unilateral primary osteoarthritis, right knee: Secondary | ICD-10-CM | POA: Insufficient documentation

## 2019-02-10 DIAGNOSIS — F17210 Nicotine dependence, cigarettes, uncomplicated: Secondary | ICD-10-CM | POA: Insufficient documentation

## 2019-02-10 DIAGNOSIS — Z79899 Other long term (current) drug therapy: Secondary | ICD-10-CM | POA: Diagnosis not present

## 2019-02-10 DIAGNOSIS — Z8673 Personal history of transient ischemic attack (TIA), and cerebral infarction without residual deficits: Secondary | ICD-10-CM | POA: Insufficient documentation

## 2019-02-10 DIAGNOSIS — C541 Malignant neoplasm of endometrium: Secondary | ICD-10-CM | POA: Insufficient documentation

## 2019-02-10 DIAGNOSIS — I671 Cerebral aneurysm, nonruptured: Secondary | ICD-10-CM | POA: Diagnosis not present

## 2019-02-10 DIAGNOSIS — Z90710 Acquired absence of both cervix and uterus: Secondary | ICD-10-CM | POA: Insufficient documentation

## 2019-02-10 DIAGNOSIS — E7800 Pure hypercholesterolemia, unspecified: Secondary | ICD-10-CM | POA: Insufficient documentation

## 2019-02-10 NOTE — Patient Instructions (Signed)
Please notify Dr Denman George at phone number [phone] if you notice vaginal bleeding, new pelvic or abdominal pains, bloating, feeling full easy, or a change in bladder or bowel function.   Please contact Dr Serita Grit office (at [phone]) in early 2021 to request an appointment with her for June/July, 2021.

## 2019-02-10 NOTE — Progress Notes (Signed)
Follow-up Note: Gyn-Onc  Consult was initially requested by Dr. Dellis Gordon for the evaluation of Rebecca Gordon 63 y.o. female  CC:  Chief Complaint  Patient presents with  . Endometrial cancer [facility])    Assessment/Plan:  [name]  is a 63 y.o.  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  Pathology revealed low risk factors for recurrence, therefore no adjuvant therapy is recommended according to NCCN guidelines.  I discussed risk for recurrence and typical symptoms encouraged her to notify us of these should they develop between visits.  I recommend she will continue to have follow-up every 6 months for 5 years in accordance with NCCN guidelines. Those visits should include symptom assessment, physical exam and pelvic examination. Pap smears are not indicated or recommended in the routine surveillance of endometrial cancer.  I will have her seen by genetics given the equivocal result in MMR protein PMS2.   HPI: Ms Rebecca Gordon is a 63 year old P2 who is seen in consultation at the request of Dr Rebecca Gordon for grade 1 endometrial cancer.  The patient reports menopausal bleeding since November 2019.  She had posterior menopause at age 77.  She was seen by Dr. Dellis Gordon who performed a transvaginal ultrasound and a sonohysterogram on May 08, 2018.  This revealed an anteverted uterus measuring 6.9 x 4 x 2.9 cm.  The endometrium was 7 mm with a 1.7 x 7 mm endometrial mass thought to be an endometrial polyp.  She was then taken to the operating room for a D&C procedure and hysteroscopy on May 26, 2018.  Final pathology from that procedure revealed a FIGO grade 1 endometrioid adenocarcinoma.  The patient has a history of a TIA in 2018 treated at [facility] with full work-up.  No cardiac or vascular pathology was identified as part of the work-up.  She was placed on aspirin 325 mg daily following this episode.  Her only residual  symptoms from her TIA are left hand numbness and tingling and some strange feelings in her left upper.  She is had no prior abdominal surgeries.  She has had 2 prior vaginal deliveries.  Her family history is significant for father with brain cancer, skin cancer, and prostate cancer.  There is a lot of colon cancer on that side of the family.  She reports significant anal pain from an anal fissure diagnosed in August, 2019. No relief with therapies by Dr Rebecca Gordon.   On 07/10/18 she underwent robotic assisted total hysterectomy, BSO, SLN biopsy and left lymphadenectomy. Pathology revealed a grade 1 endometrioid endometrial cancer with 1 of 59m (inner half) myometrial invasion and LVSI no present. Lymph nodes, cervix and adnexa were negative.  She was determined to have low risk for recurrence and was recommended no further adjuvant therapy in accordance with NCCN guidelines.   Interval hx: She has no specific complaints. CT scan for upper GI bleeding in May, 2020 was negative for signs of recurrence.  She has occasional left pelvic twinges.   She was diagnosed with COVID19 in May, 2020 (asymptomatic).   Current Meds:  Outpatient Encounter Medications as of 02/10/2019  Medication Sig  . alendronate (FOSAMAX) 70 MG tablet Take 1 tablet (70 mg total) by mouth every 7 (seven) days. Take with a full glass of water on an empty stomach. (Patient taking differently: Take 70 mg by mouth every Sunday. Take with a full glass of water on an empty  stomach.)  . amLODipine (NORVASC) 10 MG tablet Take 1 tablet (10 mg total) by mouth daily.  Marland Kitchen aspirin 325 MG tablet Take 325 mg by mouth daily.  . cholecalciferol (VITAMIN D) 1000 units tablet Take 1,000 Units by mouth every morning.   . desvenlafaxine (PRISTIQ) 50 MG 24 hr tablet TAKE ONE TABLET BY MOUTH DAILY  . esomeprazole (NEXIUM) 20 MG capsule Take 20 mg by mouth daily at 12 noon.  . fluticasone (FLONASE) 50 MCG/ACT nasal spray Place 2 sprays into both  nostrils daily as needed for allergies.  . hydrochlorothiazide (MICROZIDE) 12.5 MG capsule Take 1 capsule (12.5 mg total) by mouth daily.  Marland Kitchen losartan (COZAAR) 100 MG tablet TAKE ONE TABLET BY MOUTH DAILY  . metoprolol succinate (TOPROL-XL) 50 MG 24 hr tablet Take 1 tablet (50 mg total) by mouth daily. Take with or immediately following a meal.  . Multiple Vitamin (MULTIVITAMIN) tablet Take 1 tablet by mouth daily.  . polyethylene glycol powder (GLYCOLAX/MIRALAX) powder Take 17 g by mouth daily.  . Probiotic Product (ULTRAFLORA IMMUNE HEALTH PO) Take 1 capsule by mouth daily.  . rosuvastatin (CRESTOR) 40 MG tablet TAKE ONE TABLET BY MOUTH DAILY  . topiramate (TOPAMAX) 100 MG tablet TAKE ONE TABLET BY MOUTH EVERY NIGHT AT BEDTIME  . traZODone (DESYREL) 50 MG tablet TAKE ONE TABLET BY MOUTH EVERY NIGHT AT BEDTIME AS NEEDED FOR SLEEP  . [DISCONTINUED] triamcinolone cream (KENALOG) 0.1 % Apply 1 application topically 2 (two) times daily.  . [DISCONTINUED] valACYclovir (VALTREX) 1000 MG tablet Take 1 tablet (1,000 mg total) by mouth 3 (three) times daily.  . [DISCONTINUED] varenicline (CHANTIX CONTINUING MONTH PAK) 1 MG tablet Take 1 tablet (1 mg total) by mouth 2 (two) times daily.   No facility-administered encounter medications on file as of 02/10/2019.     Allergy:  Allergies  Allergen Reactions  . Augmentin  [Amoxicillin-Pot Clavulanate] Diarrhea and Nausea And Vomiting  . Neurontin [Gabapentin] Other (See Comments)    Causes gas     Social Hx:   Social History   Socioeconomic History  . Marital status: Married    Spouse name: Not on file  . Number of children: 2  . Years of education: College  . Highest education level: Not on file  Occupational History  . Occupation: Unemployed  Social Needs  . Financial resource strain: Not on file  . Food insecurity    Worry: Not on file    Inability: Not on file  . Transportation needs    Medical: Not on file    Non-medical: Not on  file  Tobacco Use  . Smoking status: Current Every Day Smoker    Packs/day: 1.00    Years: 40.00    Pack years: 40.00    Types: Cigarettes  . Smokeless tobacco: Never Used  . Tobacco comment: Previous 1 pack a day  Substance and Sexual Activity  . Alcohol use: Not Currently    Comment: daily - 1 bottle of wine--- 09/23/2016, 3-4 glasses--- 10/15/16  . Drug use: No  . Sexual activity: Yes    Birth control/protection: Post-menopausal    Comment: 1st intercourse- 30, partners- 70, married- 21 yrs   Lifestyle  . Physical activity    Days per week: Not on file    Minutes per session: Not on file  . Stress: Not on file  Relationships  . Social Herbalist on phone: Not on file    Gets together: Not on file  Attends religious service: Not on file    Active member of club or organization: Not on file    Attends meetings of clubs or organizations: Not on file    Relationship status: Not on file  . Intimate partner violence    Fear of current or ex partner: Not on file    Emotionally abused: Not on file    Physically abused: Not on file    Forced sexual activity: Not on file  Other Topics Concern  . Not on file  Social History Narrative   Lives at home w/ her husband   Right-handed   Caffeine: 2-3 cups per day    Past Surgical Hx:  Past Surgical History:  Procedure Laterality Date  . CLAVICLE SURGERY Right 2016   fracture repair  . COLONOSCOPY  2017   multiples  . DILATATION & CURETTAGE/HYSTEROSCOPY WITH MYOSURE N/A 05/26/2018   Procedure: DILATATION & CURETTAGE/HYSTEROSCOPY WITH MYOSURE;  Surgeon: Princess Bruins, MD;  Location: [place];  Service: Gynecology;  Laterality: N/A;  . EYE SURGERY Right    "growth on eye removed"  . FOOT SURGERY Right 08/2017   benign mass  . HEMORRHOID SURGERY N/A 06/24/2018   Procedure: HEMORRHOIDECTOMY;  Surgeon: Michael Boston, MD;  Location: [facility];  Service: General;  Laterality: N/A;  .  KNEE ARTHROSCOPY Right    Menicus repair  . ORIF CLAVICULAR FRACTURE Right 04/28/2015   Procedure: REVISION ORIF RIGHT CLAVICAL FRACTURE, ALLOGRAFT BONE GRAFTING;  Surgeon: Justice Britain, MD;  Location: [place];  Service: Orthopedics;  Laterality: Right;  . PLACEMENT OF BREAST IMPLANTS Bilateral   . ROBOTIC ASSISTED TOTAL HYSTERECTOMY WITH BILATERAL SALPINGO OOPHERECTOMY Bilateral 07/10/2018   Procedure: XI ROBOTIC ASSISTED TOTAL HYSTERECTOMY WITH BILATERAL SALPINGO OOPHORECTOMY;  Surgeon: Everitt Amber, MD;  Location: [facility];  Service: Gynecology;  Laterality: Bilateral;  . SENTINEL NODE BIOPSY N/A 07/10/2018   Procedure: SENTINEL NODE BIOPSY, LEFT PELVIC NODE;  Surgeon: Everitt Amber, MD;  Location: [facility];  Service: Gynecology;  Laterality: N/A;  . SPHINCTEROTOMY N/A 06/24/2018   Procedure: ANAL SPHINCTEROTOMY, ANORECTAL EXAM UNDER ANESTHESIA;  Surgeon: Michael Boston, MD;  Location: [place];  Service: General;  Laterality: N/A;  . TONSILLECTOMY      Past Medical Hx:  Past Medical History:  Diagnosis Date  . Anal fissure   . 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 Anxiety   . Depression   . 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 Endometrial polyp   . Former smoker 09/23/2016  . GERD (gastroesophageal reflux disease)    Nexium  . H/O clavicle fracture 2017   Right  . High cholesterol   . History of bronchitis   . History of pneumonia    years ago  . Hypertension   . LVH (left ventricular hypertrophy) 09/23/2016   Mild, noted on ECHO  . Numbness    left fingers after TIA  . OA (osteoarthritis)    "right knee"  . Osteoporosis 08/2017   T score -2.7  . Panic attacks   . Postmenopausal bleeding   . Thyroid nodule 2019   Multiple  . TIA (transient ischemic attack) 09/2016  . Wears contact lenses     Past Gynecological History:  See HPI No LMP recorded. Patient is postmenopausal.  Family Hx:  Family History  Problem Relation Age  of Onset  . Heart failure Mother   . Hypertension Father   .  Brain cancer Father   . Prostate cancer Father   . Skin cancer Father     Review of Systems:  Constitutional  Feels well,    ENT Normal appearing ears and nares bilaterally Skin/Breast  No rash, sores, jaundice, itching, dryness Cardiovascular  No chest pain, shortness of breath, or edema  Pulmonary  No cough or wheeze.  Gastro Intestinal  No nausea, vomitting, or diarrhoea. No bright red blood per rectum, no abdominal pain, change in bowel movement, or constipation. Genito Urinary  No frequency, urgency, dysuria, no  bleeding Musculo Skeletal  No myalgia, arthralgia, joint swelling or pain  Neurologic  No weakness, numbness, change in gait,  Psychology  No depression, anxiety, insomnia.   Vitals:  Blood pressure (!) 109/52, pulse 77, temperature 98.7 F (37.1 C), temperature source Temporal, resp. rate 16, height 5' 3.5" (1.613 m), weight 137 lb 5 oz (62.3 kg), SpO2 98 %.  Physical Exam: WD in NAD Neck  Supple NROM, without any enlargements.  Lymph Node Survey No cervical supraclavicular or inguinal adenopathy Cardiovascular  Pulse normal rate, regularity and rhythm. S1 and S2 normal.  Lungs  Clear to auscultation bilateraly, without wheezes/crackles/rhonchi. Good air movement.  Skin  No rash/lesions/breakdown  Psychiatry  Alert and oriented to person, place, and time  Abdomen  Normoactive bowel sounds, abdomen soft, non-tender and thin without evidence of hernia. Incisions soft Back No CVA tenderness Genito Urinary  Vulva/vagina: Normal external female genitalia.  No lesions. No discharge or bleeding.  Vaginal cuff smooth, no lesions, no palpable masses.  Rectal  deferred Extremities  No bilateral cyanosis, clubbing or edema.  Thereasa Solo, MD  02/10/2019, 3:09 PM

## 2019-02-11 ENCOUNTER — Other Ambulatory Visit: Payer: Self-pay

## 2019-02-11 ENCOUNTER — Telehealth: Payer: Self-pay | Admitting: *Deleted

## 2019-02-11 DIAGNOSIS — Z20828 Contact with and (suspected) exposure to other viral communicable diseases: Secondary | ICD-10-CM

## 2019-02-11 DIAGNOSIS — Z20822 Contact with and (suspected) exposure to covid-19: Secondary | ICD-10-CM

## 2019-02-11 NOTE — Telephone Encounter (Signed)
Copied from [place] [id number]. Topic: General - Inquiry >> Feb 11, 2019 10:28 AM Percell Belt A wrote: Reason for CRM: pt called in and stated that on 10/21 she was to have another covid antibody test but she noticed in my chart that it says hep c antibody test.  She would like to know if this was an error because that is not what she was suppose to have  Best number -  [phone]

## 2019-02-11 NOTE — Telephone Encounter (Signed)
Left VM about order for COVID antibody testing. Advised pt to make a lab appointment only as long as she is not having any Sx.

## 2019-02-16 ENCOUNTER — Other Ambulatory Visit: Payer: Self-pay

## 2019-02-16 ENCOUNTER — Encounter: Payer: Self-pay | Admitting: Gastroenterology

## 2019-02-16 ENCOUNTER — Ambulatory Visit: Payer: BC Managed Care – PPO | Admitting: Gastroenterology

## 2019-02-16 VITALS — BP 114/60 | HR 76 | Temp 98.1°F | Ht 63.0 in | Wt 138.4 lb

## 2019-02-16 DIAGNOSIS — Z8542 Personal history of malignant neoplasm of other parts of uterus: Secondary | ICD-10-CM | POA: Diagnosis not present

## 2019-02-16 DIAGNOSIS — Z800 Family history of malignant neoplasm of digestive organs: Secondary | ICD-10-CM | POA: Diagnosis not present

## 2019-02-16 DIAGNOSIS — K581 Irritable bowel syndrome with constipation: Secondary | ICD-10-CM | POA: Diagnosis not present

## 2019-02-16 NOTE — Progress Notes (Signed)
Rebecca Gordon    WE:[id number]    1955-12-24  Primary Care Physician:[name], DO  Referring Physician: [name], Alferd Apa, DO [address] [place],  [place] [address]   Chief complaint: History of anal fissure, constipation  HPI: 63 year old Caucasian female is here to establish GI care.  She was previously followed by Dr. Juanita Craver Colonoscopy 09/02/2015: Seven small sessile polyps in rectosigmoid and rectum (hyperplastic), pan colonic diverticulosis and internal hemorrhoids.  March 2020: hemorrhoidectomy and anal sphincterotomy  Diagnosed with endometrial cancer s/p total hysterectomy with BSO and L pelvic lymphadenecetomy in March 2020.  Family history of colon cancer  Father had brain cancer  History of TIA  Denies any nausea, vomiting, abdominal pain, melena or bright red blood per rectum She was on prolonged course of antibiotics for tooth and peridental infection, she felt her bowel habits improved after she took the antibiotics.  She started taking align daily after hemorrhoidectomy.  She is currently not on any fiber supplements.  Outpatient Encounter Medications as of 02/16/2019  Medication Sig  . alendronate (FOSAMAX) 70 MG tablet Take 1 tablet (70 mg total) by mouth every 7 (seven) days. Take with a full glass of water on an empty stomach. (Patient taking differently: Take 70 mg by mouth every Sunday. Take with a full glass of water on an empty stomach.)  . amLODipine (NORVASC) 10 MG tablet Take 1 tablet (10 mg total) by mouth daily.  Marland Kitchen aspirin 325 MG tablet Take 325 mg by mouth daily.  . cholecalciferol (VITAMIN D) 1000 units tablet Take 1,000 Units by mouth every morning.   . desvenlafaxine (PRISTIQ) 50 MG 24 hr tablet TAKE ONE TABLET BY MOUTH DAILY  . esomeprazole (NEXIUM) 20 MG capsule Take 20 mg by mouth daily at 12 noon.  . fluticasone (FLONASE) 50 MCG/ACT nasal spray Place 2 sprays into both nostrils daily as needed  for allergies.  . hydrochlorothiazide (MICROZIDE) 12.5 MG capsule Take 1 capsule (12.5 mg total) by mouth daily.  Marland Kitchen losartan (COZAAR) 100 MG tablet TAKE ONE TABLET BY MOUTH DAILY  . metoprolol succinate (TOPROL-XL) 50 MG 24 hr tablet Take 1 tablet (50 mg total) by mouth daily. Take with or immediately following a meal.  . Multiple Vitamin (MULTIVITAMIN) tablet Take 1 tablet by mouth daily.  . polyethylene glycol powder (GLYCOLAX/MIRALAX) powder Take 17 g by mouth daily.  . Probiotic Product (ULTRAFLORA IMMUNE HEALTH PO) Take 1 capsule by mouth daily.  . rosuvastatin (CRESTOR) 40 MG tablet TAKE ONE TABLET BY MOUTH DAILY  . topiramate (TOPAMAX) 100 MG tablet TAKE ONE TABLET BY MOUTH EVERY NIGHT AT BEDTIME  . traZODone (DESYREL) 50 MG tablet TAKE ONE TABLET BY MOUTH EVERY NIGHT AT BEDTIME AS NEEDED FOR SLEEP  . varenicline (CHANTIX) 1 MG tablet Take 1 mg by mouth 2 (two) times daily.   No facility-administered encounter medications on file as of 02/16/2019.     Allergies as of 02/16/2019 - Review Complete 02/16/2019  Allergen Reaction Noted  . Augmentin  [amoxicillin-pot clavulanate] Diarrhea and Nausea And Vomiting 12/08/2018    Past Medical History:  Diagnosis Date  . Alcoholism ([place])   . Anal fissure   . 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 Anxiety   . Colon polyps   . Depression   . 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 Endometrial  polyp   . Former smoker 09/23/2016  . GERD (gastroesophageal reflux disease)    Nexium  . H/O clavicle fracture 2017   Right  . High cholesterol   . History of bronchitis   . History of pneumonia    years ago  . Hypertension   . LVH (left ventricular hypertrophy) 09/23/2016   Mild, noted on ECHO  . Numbness    left fingers after TIA  . OA (osteoarthritis)    "right knee"  . Osteoporosis 08/2017   T score -2.7  . Panic attacks   . Postmenopausal bleeding   . Thyroid nodule 2019   Multiple  . TIA  (transient ischemic attack) 09/2016  . Wears contact lenses     Past Surgical History:  Procedure Laterality Date  . CLAVICLE SURGERY Right 2016   fracture repair  . COLONOSCOPY  2017   multiples  . DILATATION & CURETTAGE/HYSTEROSCOPY WITH MYOSURE N/A 05/26/2018   Procedure: DILATATION & CURETTAGE/HYSTEROSCOPY WITH MYOSURE;  Surgeon: Princess Bruins, MD;  Location: [place];  Service: Gynecology;  Laterality: N/A;  . EYE SURGERY Right    "growth on eye removed"  . FOOT SURGERY Right 08/2017   benign mass  . HEMORRHOID SURGERY N/A 06/24/2018   Procedure: HEMORRHOIDECTOMY;  Surgeon: Michael Boston, MD;  Location[facility];  Service: General;  Laterality: N/A;  . KNEE ARTHROSCOPY Right    Menicus repair  . ORIF CLAVICULAR FRACTURE Right 04/28/2015   Procedure: REVISION ORIF RIGHT CLAVICAL FRACTURE, ALLOGRAFT BONE GRAFTING;  Surgeon: Justice Britain, MD;  Location: [place];  Service: Orthopedics;  Laterality: Right;  . Peridontal Laser Surgery  01/2019   due to infection, x 2  . PLACEMENT OF BREAST IMPLANTS Bilateral   . ROBOTIC ASSISTED TOTAL HYSTERECTOMY WITH BILATERAL SALPINGO OOPHERECTOMY Bilateral 07/10/2018   Procedure: XI ROBOTIC ASSISTED TOTAL HYSTERECTOMY WITH BILATERAL SALPINGO OOPHORECTOMY;  Surgeon: Everitt Amber, MD;  [facility];  Service: Gynecology;  Laterality: Bilateral;  . SENTINEL NODE BIOPSY N/A 07/10/2018   Procedure: SENTINEL NODE BIOPSY, LEFT PELVIC NODE;  Surgeon: Everitt Amber, MD;  [facility];  Service: Gynecology;  Laterality: N/A;  . SPHINCTEROTOMY N/A 06/24/2018   Procedure: ANAL SPHINCTEROTOMY, ANORECTAL EXAM UNDER ANESTHESIA;  Surgeon: Michael Boston, MD;  Location: [place];  Service: General;  Laterality: N/A;  . TONSILLECTOMY      Family History  Problem Relation Age of Onset  . Heart failure Mother   . Hypertension Father   . Brain cancer Father   . Prostate cancer Father   . Skin cancer Father    . Colon cancer Maternal Aunt     Social History   Socioeconomic History  . Marital status: Married    Spouse name: Not on file  . Number of children: 2  . Years of education: College  . Highest education level: Not on file  Occupational History  . Occupation: Unemployed  Social Needs  . Financial resource strain: Not on file  . Food insecurity    Worry: Not on file    Inability: Not on file  . Transportation needs    Medical: Not on file    Non-medical: Not on file  Tobacco Use  . Smoking status: Former Smoker    Packs/day: 1.00    Years: 40.00    Pack years: 40.00    Types: Cigarettes    Quit date: 11/2018    Years since quitting: 0.2  . Smokeless tobacco: Never Used  .  Tobacco comment: Previous 1 pack a day  Substance and Sexual Activity  . Alcohol use: Not Currently    Comment: daily - 1 bottle of wine--- 09/23/2016, 3-4 glasses--- 10/15/16  . Drug use: No  . Sexual activity: Yes    Birth control/protection: Post-menopausal    Comment: 1st intercourse- 37, partners- 58, married- 11 yrs   Lifestyle  . Physical activity    Days per week: Not on file    Minutes per session: Not on file  . Stress: Not on file  Relationships  . Social Herbalist on phone: Not on file    Gets together: Not on file    Attends religious service: Not on file    Active member of club or organization: Not on file    Attends meetings of clubs or organizations: Not on file    Relationship status: Not on file  . Intimate partner violence    Fear of current or ex partner: Not on file    Emotionally abused: Not on file    Physically abused: Not on file    Forced sexual activity: Not on file  Other Topics Concern  . Not on file  Social History Narrative   Lives at home w/ her husband   Right-handed   Caffeine: 2-3 cups per day      Review of systems: Review of Systems  Constitutional: Negative for fever and chills.  HENT: Negative.   Eyes: Negative for blurred vision.   Respiratory: Negative for cough, shortness of breath and wheezing.   Cardiovascular: Negative for chest pain and palpitations.  Gastrointestinal: as per HPI Genitourinary: Negative for dysuria, urgency, frequency and hematuria.  Musculoskeletal: Negative for myalgias, back pain and joint pain.  Skin: Negative for itching and rash.  Neurological: Negative for dizziness, tremors, focal weakness, seizures and loss of consciousness.  Endo/Heme/Allergies: Negative Psychiatric/Behavioral: Negative for suicidal ideas and hallucinations.  Positive for depression and anxiety  All other systems reviewed and are negative.   Physical Exam: Vitals:   02/16/19 0958  Temp: 98.1 F (36.7 C)   Body mass index is 24.51 kg/m. Gen:      No acute distress HEENT:  EOMI, sclera anicteric Neck:     No masses; no thyromegaly Lungs:    Clear to auscultation bilaterally; normal respiratory effort CV:         Regular rate and rhythm; no murmurs Abd:      + bowel sounds; soft, non-tender; no palpable masses, no distension Ext:    No edema; adequate peripheral perfusion Skin:      Warm and dry; no rash Neuro: alert and oriented x 3 Psych: normal mood and affect  Data Reviewed:  Reviewed labs, radiology imaging, old records and pertinent past GI work up   Assessment and Plan/Recommendations:  63 year old with history of endometrial cancer, family history of colon cancer, colonic diverticulosis with irritable bowel syndrome predominant constipation  Start Benefiber 1 tablespoon 3 times daily Continue MiraLAX 1 capful daily, titrate based on response to have 1 soft bowel movement daily  Stop daily probiotics  Patient is worried if 5-year time interval between colonoscopy is adequate given her personal history of endometrial cancer and family history of colon cancer.  Will refer to genetics for testing and exclude any specific mutation. For now we will leave the recall for colonoscopy May 2022, will  schedule it sooner if develop any symptoms or if genetic testing is positive for any specific colon related Gene  mutations.   Damaris Hippo , MD    CC: [name], Alferd Apa, *

## 2019-02-16 NOTE — Patient Instructions (Signed)
STOP probiotics  We have referred you to Genetics Counseling and they will be contacting you with that appointment  Take Benefiber 1 tablespoon 2-3 times daily with meals  Take Miralax 1 capful daily  If you are age 63 or older, your body mass index should be between 23-30. Your Body mass index is 24.51 kg/m. If this is out of the aforementioned range listed, please consider follow up with your Primary Care Provider.  If you are age 56 or younger, your body mass index should be between 19-25. Your Body mass index is 24.51 kg/m. If this is out of the aformentioned range listed, please consider follow up with your Primary Care Provider.    I appreciate the  opportunity to care for you  Thank You   Harl Bowie , MD

## 2019-02-18 ENCOUNTER — Telehealth: Payer: Self-pay | Admitting: Licensed Clinical Social Worker

## 2019-02-18 NOTE — Telephone Encounter (Signed)
Received a genetic counseling referral from Dr. Silverio Decamp for fhx of breast cancer/hx of endometrial cancer. Pt has been cld and scheduled to see Faith Rogue on 11/9 at 11am. She's aware to arrive 15 minutes early.

## 2019-02-20 ENCOUNTER — Telehealth: Payer: Self-pay | Admitting: Licensed Clinical Social Worker

## 2019-02-20 NOTE — Telephone Encounter (Signed)
Called patient regarding upcoming Web[facility] appointment, per patient's request this will be a walk-in visit. °

## 2019-02-23 ENCOUNTER — Inpatient Hospital Stay: Payer: BC Managed Care – PPO

## 2019-02-23 ENCOUNTER — Other Ambulatory Visit: Payer: Self-pay

## 2019-02-23 ENCOUNTER — Encounter: Payer: Self-pay | Admitting: Licensed Clinical Social Worker

## 2019-02-23 ENCOUNTER — Inpatient Hospital Stay: Payer: BC Managed Care – PPO | Attending: Gynecologic Oncology | Admitting: Licensed Clinical Social Worker

## 2019-02-23 ENCOUNTER — Other Ambulatory Visit: Payer: Self-pay | Admitting: Licensed Clinical Social Worker

## 2019-02-23 DIAGNOSIS — C541 Malignant neoplasm of endometrium: Secondary | ICD-10-CM

## 2019-02-23 DIAGNOSIS — Z8042 Family history of malignant neoplasm of prostate: Secondary | ICD-10-CM

## 2019-02-23 DIAGNOSIS — Z809 Family history of malignant neoplasm, unspecified: Secondary | ICD-10-CM

## 2019-02-23 DIAGNOSIS — Z808 Family history of malignant neoplasm of other organs or systems: Secondary | ICD-10-CM

## 2019-02-23 DIAGNOSIS — Z800 Family history of malignant neoplasm of digestive organs: Secondary | ICD-10-CM

## 2019-02-23 NOTE — Progress Notes (Signed)
REFERRING PROVIDER: Mauri Pole, MD [place],  [place] [id number]  PRIMARY PROVIDER:  [name], Alferd Apa, DO  PRIMARY REASON FOR VISIT:  1. Endometrial cancer ([place])   2. Family history of prostate cancer   3. Family history of brain cancer   4. Family history of colon cancer      HISTORY OF PRESENT ILLNESS:   Rebecca Gordon, a 63 y.o. female, was seen for a [place] cancer genetics consultation at the request of Dr. Silverio Decamp due to a personal and family history of cancer.  Rebecca Gordon presents to clinic today to discuss the possibility of a hereditary predisposition to cancer, genetic testing, and to further clarify her future cancer risks, as well as potential cancer risks for family members.   In 2020, at the age of 56, Rebecca Gordon was diagnosed with stage 1A endometrial cancer. This was treated with TAH-BSO. Tumor testing revealed MSI Stable, on MMR PMS2 was equivocal.  CANCER HISTORY:  Oncology History   No history exists.     RISK FACTORS:  Menarche was at age 41.  First live birth at age 34.  OCP use for approximately 1 years. Ovaries intact: no.  Hysterectomy: yes.  Menopausal status: postmenopausal.  HRT use: 0 years. Colonoscopy: yes; 7 hyperplastic polyps in 2017. Mammogram within the last year: yes. Number of breast biopsies: 0. Up to date with pelvic exams: yes.   Past Medical History:  Diagnosis Date  . Alcoholism ([place])   . Anal fissure   . 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 Anxiety   . Colon polyps   . Depression   . 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 Endometrial polyp   . Family history of brain cancer   . Family history of prostate cancer   . Former smoker 09/23/2016  . GERD (gastroesophageal reflux disease)    Nexium  . H/O clavicle fracture 2017   Right  . High cholesterol   . History of bronchitis   . History of pneumonia    years ago  . Hypertension   . LVH (left  ventricular hypertrophy) 09/23/2016   Mild, noted on ECHO  . Numbness    left fingers after TIA  . OA (osteoarthritis)    "right knee"  . Osteoporosis 08/2017   T score -2.7  . Panic attacks   . Postmenopausal bleeding   . Thyroid nodule 2019   Multiple  . TIA (transient ischemic attack) 09/2016  . Wears contact lenses     Past Surgical History:  Procedure Laterality Date  . CLAVICLE SURGERY Right 2016   fracture repair  . COLONOSCOPY  2017   multiples  . DILATATION & CURETTAGE/HYSTEROSCOPY WITH MYOSURE N/A 05/26/2018   Procedure: DILATATION & CURETTAGE/HYSTEROSCOPY WITH MYOSURE;  Surgeon: Princess Bruins, MD;  Location: [facility];  Service: Gynecology;  Laterality: N/A;  . EYE SURGERY Right    "growth on eye removed"  . FOOT SURGERY Right 08/2017   benign mass  . HEMORRHOID SURGERY N/A 06/24/2018   Procedure: HEMORRHOIDECTOMY;  Surgeon: Michael Boston, MD;  Location: [facility];  Service: General;  Laterality: N/A;  . KNEE ARTHROSCOPY Right    Menicus repair  . ORIF CLAVICULAR FRACTURE Right 04/28/2015   Procedure: REVISION ORIF RIGHT CLAVICAL FRACTURE, ALLOGRAFT BONE GRAFTING;  Surgeon: Justice Britain, MD;  Location: [facility];  Service: Orthopedics;  Laterality: Right;  . Peridontal  Laser Surgery  01/2019   due to infection, x 2  . PLACEMENT OF BREAST IMPLANTS Bilateral   . ROBOTIC ASSISTED TOTAL HYSTERECTOMY WITH BILATERAL SALPINGO OOPHERECTOMY Bilateral 07/10/2018   Procedure: XI ROBOTIC ASSISTED TOTAL HYSTERECTOMY WITH BILATERAL SALPINGO OOPHORECTOMY;  Surgeon: Everitt Amber, MD;  Location: [facility];  Service: Gynecology;  Laterality: Bilateral;  . SENTINEL NODE BIOPSY N/A 07/10/2018   Procedure: SENTINEL NODE BIOPSY, LEFT PELVIC NODE;  Surgeon: Everitt Amber, MD;  Location: [facility];  Service: Gynecology;  Laterality: N/A;  . SPHINCTEROTOMY N/A 06/24/2018   Procedure: ANAL SPHINCTEROTOMY, ANORECTAL EXAM UNDER ANESTHESIA;  Surgeon: Michael Boston, MD;   Location: [facility];  Service: General;  Laterality: N/A;  . TONSILLECTOMY      Social History   Socioeconomic History  . Marital status: Married    Spouse name: Not on file  . Number of children: 2  . Years of education: College  . Highest education level: Not on file  Occupational History  . Occupation: Unemployed  Social Needs  . Financial resource strain: Not on file  . Food insecurity    Worry: Not on file    Inability: Not on file  . Transportation needs    Medical: Not on file    Non-medical: Not on file  Tobacco Use  . Smoking status: Former Smoker    Packs/day: 1.00    Years: 40.00    Pack years: 40.00    Types: Cigarettes    Quit date: 11/2018    Years since quitting: 0.2  . Smokeless tobacco: Never Used  . Tobacco comment: Previous 1 pack a day  Substance and Sexual Activity  . Alcohol use: Not Currently    Comment: daily - 1 bottle of wine--- 09/23/2016, 3-4 glasses--- 10/15/16  . Drug use: No  . Sexual activity: Yes    Birth control/protection: Post-menopausal    Comment: 1st intercourse- 44, partners- 71, married- 76 yrs   Lifestyle  . Physical activity    Days per week: Not on file    Minutes per session: Not on file  . Stress: Not on file  Relationships  . Social Herbalist on phone: Not on file    Gets together: Not on file    Attends religious service: Not on file    Active member of club or organization: Not on file    Attends meetings of clubs or organizations: Not on file    Relationship status: Not on file  Other Topics Concern  . Not on file  Social History Narrative   Lives at home w/ her husband   Right-handed   Caffeine: 2-3 cups per day     FAMILY HISTORY:  We obtained a detailed, 4-generation family history.  Significant diagnoses are listed below: Family History  Problem Relation Age of Onset  . Heart failure Mother   . Hypertension Father   . Brain cancer Father 46  . Prostate cancer Father 28  .  Skin cancer Father   . Cancer Paternal Uncle        unsure type, possibly colon  . Leukemia Cousin 9   Rebecca Gordon has 2 daughters, ages 59 and 34, no history of cancer. Patient does not have siblings.  Rebecca Gordon mother died at 63 due to heart failure. Patient had 1 maternal aunt, no cancers that she is aware of. No biological maternal cousins. Her maternal grandparents are deceased, no cancers she is aware of.  Rebecca Gordon father  had skin cancer, prostate cancer diagnosed at 57 and brain cancer diagnosed at 42. He died at 59. Patient had 3 paternal aunts, 1 paternal uncle. She believes her uncle and one of her aunts had cancer, and at least one of them likely had colon cancer but she is unsure. A paternal cousin had leukemia and died at age 11. Paternal grandparents are deceased, no known cancer diagnoses.   Ms. Schomburg is unaware of previous family history of genetic testing for hereditary cancer risks. Patient's ancestors are of [place] and [place] descent. There is no reported Ashkenazi Jewish ancestry. There is no known consanguinity.  GENETIC COUNSELING ASSESSMENT: Rebecca Gordon is a 63 y.o. female with a personal and family history which is not particularly suggestive of a hereditary cancer syndrome. We, therefore, discussed and recommended the following at today's visit.   DISCUSSION: We discussed that 5 - 10% of cancer is hereditary. We discussed Lynch syndrome given her reported family history of colon cancer and MMR result, however she does not meet criteria for testing without knowing more about how many relatives had colon cancer or at what ages. There are other genes that can be associated with hereditary cancer syndromes. We discussed that testing is beneficial for several reasons including knowing how to follow individuals after completing their treatment, and understand if other family members could be at risk for cancer and allow them to undergo genetic testing.   We reviewed the  characteristics, features and inheritance patterns of hereditary cancer syndromes. We also discussed genetic testing, including the appropriate family members to test, the process of testing, insurance coverage and turn-around-time for results. We discussed the implications of a negative, positive and/or variant of uncertain significant result. Rebecca Gordon does not meet criteria for testing, but we still feel it would be reasonable for her to consider testing. We offered for Rebecca Gordon pursue genetic testing for the [facility] CancerNext-Expanded gene panel.   The CancerNext-Expanded + RNAinsight gene panel offered by [employer] and includes sequencing and rearrangement analysis for the following 77 genes: IP, ALK, APC*, ATM*, AXIN2, BAP1, BARD1, BLM, BMPR1A, BRCA1*, BRCA2*, BRIP1*, CDC73, CDH1*,CDK4, CDKN1B, CDKN2A, CHEK2*, CTNNA1, DICER1, FANCC, FH, FLCN, GALNT12, KIF1B, LZTR1, MAX, MEN1, MET, MLH1*, MSH2*, MSH3, MSH6*, MUTYH*, NBN, NF1*, NF2, NTHL1, PALB2*, PHOX2B, PMS2*, POT1, PRKAR1A, PTCH1, PTEN*, RAD51C*, RAD51D*,RB1, RECQL, RET, SDHA, SDHAF2, SDHB, SDHC, SDHD, SMAD4, SMARCA4, SMARCB1, SMARCE1, STK11, SUFU, TMEM127, TP53*,TSC1, TSC2, VHL and XRCC2 (sequencing and deletion/duplication); EGFR, EGLN1, HOXB13, KIT, MITF, PDGFRA, POLD1 and POLE (sequencing only); EPCAM and GREM1 (deletion/duplication only). DNA and RNA analyses performed for * genes.  Based on Rebecca Gordon's personal and family history of cancer, she does not meet criteria for genetic testing and if she chooses to pursue it, would pay the patient pay price offered by [facility] which is $249.   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 We discussed translocations briefly and recommended she discuss this with her daughters as they are of child bearing age, and potentially seek prenatal genetic counseling.    PLAN:  Rebecca Gordon did not wish to pursue genetic testing at today's visit due to the cost of testing. She will update [place] if she learns more about her family history that could qualify her to pursue this testing through insurance. We understand this decision and remain available to coordinate genetic testing at any time in the future. We, therefore,  recommend Rebecca Gordon continue to follow the cancer screening guidelines given by her primary healthcare provider.  Lastly, we encouraged Rebecca Gordon to remain in contact with cancer genetics annually so that we can continuously update the family history and inform her of any changes in cancer genetics and testing that may be of benefit for this family.   Rebecca Gordon questions were answered to her satisfaction today. Our contact information was provided should additional questions or concerns arise. Thank you for the referral and allowing [place] to share in the care of your patient.   Faith Rogue, MS, [facility] Genetic Counselor [email or web address] Phone: [phone]  The patient was seen for a total of 55 minutes in face-to-face genetic counseling. [employer] Intern Kary Kos was present during this session and assisted with the case. Drs. Magrinat, Lindi Adie and/or Burr Medico were available for discussion regarding this case.   _______________________________________________________________________ For Office Staff:   Number of people involved in session: 2 Was an Intern/ student involved with case: no

## 2019-03-21 ENCOUNTER — Other Ambulatory Visit: Payer: Self-pay | Admitting: Family Medicine

## 2019-03-31 ENCOUNTER — Telehealth (INDEPENDENT_AMBULATORY_CARE_PROVIDER_SITE_OTHER): Payer: BC Managed Care – PPO | Admitting: Neurology

## 2019-03-31 ENCOUNTER — Encounter: Payer: Self-pay | Admitting: Neurology

## 2019-03-31 DIAGNOSIS — G459 Transient cerebral ischemic attack, unspecified: Secondary | ICD-10-CM | POA: Diagnosis not present

## 2019-03-31 MED ORDER — ASPIRIN EC 81 MG PO TBEC: 81.0000 mg | DELAYED_RELEASE_TABLET | Freq: Every day | ORAL | 2 refills | Status: AC

## 2019-03-31 NOTE — Progress Notes (Signed)
Virtual Visit via Video Note  I connected with Rebecca Gordon on 03/31/19 at  8:30 AM EST by a video enabled telemedicine application and verified that I am speaking with the correct person using two identifiers.  Location: Patient: at her home Provider: at office   I discussed the limitations of evaluation and management by telemedicine and the availability of in person appointments. The patient expressed understanding and agreed to proceed.  History of Present Illness: Patient is seen today for virtual video f/u visit after last visit with me 10/15/2016.  Patient states she has done well since her last visit and has not had any episodes of TIA or strokelike symptoms since more than 2 years.  She still has minor residual tingling in the left hand which never went away.  She remains on aspirin 325 mg daily which she is tolerating well but wonders if she could reduce the dose down to 81 mg.  She has quit smoking completely 6 months ago as well as drinking alcohol more than 2 years ago and is very proud about this fact.  She had significant gum infection in October underwent treatment plan with surgery using lasers by Dr. Junita Push.  She is also having a bad right knee and knee replacement is pending improvement in her gum disease.  She states her blood pressure is well controlled and she is compliant with her medicines.  She has regular follow-up with her primary care physician and cholesterol checked 6 months ago was satisfactory.  She has no new neurological complaints. 14 system review of systems is positive only for knee pain, joint pain, difficulty walking, tingling and all other systems negative Past medical history positive for anxiety, arthritis, depression, gastroesophageal reflux disease, hypertension hyperlipidemia and TIA Patient's current medications were reviewed in the electronic medical records with the patient   Observations/Objective: Physical and neurological exams are limited due to  constraints from virtual video visit Pleasant middle-aged Caucasian lady not in distress. Neurological Exam : awake alert oriented to time place and person.  Speech and language appear normal.  Attention and memory appear intact.  Extraocular movements are full range without nystagmus.  Face is symmetric without weakness.  Tongue midline.  Motor system exam symmetric upper and lower extremity strength.  No focal weakness.  Sensation coordination not tested.  Gait not tested.  Assessment and Plan: 63 year old patient with episode of transient left face and arm flitting paresthesias of unclear etiology possible TIA  in June 2018 who appears stable from neurovascular standpoint.  Vascular risk factors of hypertension hyperlipidemia and remote smoking and heavy alcohol intake I had a long discussion with the patient with regarding her remote TIA as well as discussed risk factors and answered questions.  I recommend she reduce the dose of aspirin to enteric-coated 81 mg daily but maintain aggressive risk factor modification with strict control of hypertension with blood pressure goal below 130/90, lipids with LDL cholesterol goal below 70 mg percent and diabetes with hemoglobin A1c goal below 6.5%.  She was encouraged to eat a healthy diet with lots of fruits, vegetables, cereals and whole grains and exercise regularly as tolerated.  No routine scheduled follow-up appointment is necessary but she may return for follow-up as needed only. Follow Up Instructions:    I discussed the assessment and treatment plan with the patient. The patient was provided an opportunity to ask questions and all were answered. The patient agreed with the plan and demonstrated an understanding of the instructions.  The patient was advised to call back or seek an in-person evaluation if the symptoms worsen or if the condition fails to improve as anticipated.  I provided 30 minutes of non-face-to-face time during this  encounter.   Antony Contras, MD

## 2019-04-02 ENCOUNTER — Other Ambulatory Visit: Payer: Self-pay

## 2019-04-03 ENCOUNTER — Ambulatory Visit: Payer: Self-pay | Admitting: Family Medicine

## 2019-04-03 ENCOUNTER — Encounter: Payer: Self-pay | Admitting: Family Medicine

## 2019-04-03 VITALS — BP 104/68 | HR 73 | Temp 97.5°F | Resp 18 | Ht 63.0 in | Wt 140.0 lb

## 2019-04-03 DIAGNOSIS — I10 Essential (primary) hypertension: Secondary | ICD-10-CM | POA: Diagnosis not present

## 2019-04-03 DIAGNOSIS — F17200 Nicotine dependence, unspecified, uncomplicated: Secondary | ICD-10-CM | POA: Diagnosis not present

## 2019-04-03 DIAGNOSIS — E785 Hyperlipidemia, unspecified: Secondary | ICD-10-CM | POA: Diagnosis not present

## 2019-04-03 DIAGNOSIS — Z87891 Personal history of nicotine dependence: Secondary | ICD-10-CM

## 2019-04-03 LAB — COMPREHENSIVE METABOLIC PANEL
ALT: 27 U/L (ref 0–35)
AST: 15 U/L (ref 0–37)
Albumin: 4.2 g/dL (ref 3.5–5.2)
Alkaline Phosphatase: 56 U/L (ref 39–117)
BUN: 22 mg/dL (ref 6–23)
CO2: 29 mEq/L (ref 19–32)
Calcium: 9.2 mg/dL (ref 8.4–10.5)
Chloride: 103 mEq/L (ref 96–112)
Creatinine, Ser: 0.68 mg/dL (ref 0.40–1.20)
GFR: 87.29 mL/min (ref >60.00)
Glucose, Bld: 89 mg/dL (ref 70–99)
Potassium: 3.6 mEq/L (ref 3.5–5.1)
Sodium: 138 mEq/L (ref 135–145)
Total Bilirubin: 0.4 mg/dL (ref 0.2–1.2)
Total Protein: 6.5 g/dL (ref 6.0–8.3)

## 2019-04-03 LAB — LIPID PANEL
Cholesterol: 128 mg/dL (ref 0–200)
HDL: 53.8 mg/dL (ref >39.00)
LDL Cholesterol: 66 mg/dL (ref 0–99)
NonHDL: 74.57
Total CHOL/HDL Ratio: 2
Triglycerides: 44 mg/dL (ref 0.0–149.0)
VLDL: 8.8 mg/dL (ref 0.0–40.0)

## 2019-04-03 MED ORDER — AMLODIPINE BESYLATE 5 MG PO TABS: 5.0000 mg | ORAL_TABLET | Freq: Every day | ORAL | 3 refills | Status: DC

## 2019-04-03 MED ORDER — VARENICLINE TARTRATE 1 MG PO TABS: 1.0000 mg | ORAL_TABLET | Freq: Two times a day (BID) | ORAL | 1 refills | Status: DC

## 2019-04-03 NOTE — Progress Notes (Signed)
Patient ID: Rebecca Gordon, female    DOB: [birth year]  Age: 63 y.o. MRN: [id number]    Subjective:  Subjective  HPI Rebecca Gordon presents for f/u bp , chol and refills She has quit smoking with the chantix and whe would like to con't it.    Review of Systems  Constitutional: Negative for appetite change, diaphoresis, fatigue and unexpected weight change.  Eyes: Negative for pain, redness and visual disturbance.  Respiratory: Negative for cough, chest tightness, shortness of breath and wheezing.   Cardiovascular: Negative for chest pain, palpitations and leg swelling.  Endocrine: Negative for cold intolerance, heat intolerance, polydipsia, polyphagia and polyuria.  Genitourinary: Negative for difficulty urinating, dysuria and frequency.  Neurological: Negative for dizziness, light-headedness, numbness and headaches.    History Past Medical History:  Diagnosis Date  . Alcoholism ([place])   . Anal fissure   . 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 Anxiety   . Colon polyps   . Depression   . 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 Endometrial polyp   . Family history of brain cancer   . Family history of prostate cancer   . Former smoker 09/23/2016  . GERD (gastroesophageal reflux disease)    Nexium  . H/O clavicle fracture 2017   Right  . High cholesterol   . History of bronchitis   . History of pneumonia    years ago  . Hypertension   . LVH (left ventricular hypertrophy) 09/23/2016   Mild, noted on ECHO  . Numbness    left fingers after TIA  . OA (osteoarthritis)    "right knee"  . Osteoporosis 08/2017   T score -2.7  . Panic attacks   . Postmenopausal bleeding   . Thyroid nodule 2019   Multiple  . TIA (transient ischemic attack) 09/2016  . Wears contact lenses     She has a past surgical history that includes Tonsillectomy; Placement of breast implants (Bilateral); Colonoscopy (2017); Eye surgery (Right); ORIF clavicular fracture  (Right, 04/28/2015); Foot surgery (Right, 08/2017); Dilatation & curettage/hysteroscopy with myosure (N/A, 05/26/2018); Sphincterotomy (N/A, 06/24/2018); Hemorrhoid surgery (N/A, 06/24/2018); Clavicle surgery (Right, 2016); Knee arthroscopy (Right); Robotic assisted total hysterectomy with bilateral salpingo oophorectomy (Bilateral, 07/10/2018); Sentinel node biopsy (N/A, 07/10/2018); and Peridontal Laser Surgery (01/2019).   Her family history includes Brain cancer (age of onset: 59) in her father; Cancer in her paternal uncle; Heart failure in her mother; Hypertension in her father; Leukemia (age of onset: 16) in her cousin; Prostate cancer (age of onset: 47) in her father; Skin cancer in her father.She reports that she quit smoking about 4 months ago. Her smoking use included cigarettes. She has a 40.00 pack-year smoking history. She has never used smokeless tobacco. She reports previous alcohol use. She reports that she does not use drugs.  Current Outpatient Medications on File Prior to Visit  Medication Sig Dispense Refill  . alendronate (FOSAMAX) 70 MG tablet Take 1 tablet (70 mg total) by mouth every 7 (seven) days. Take with a full glass of water on an empty stomach. (Patient taking differently: Take 70 mg by mouth every Sunday. Take with a full glass of water on an empty stomach.) 12 tablet 4  . aspirin EC 81 MG tablet Take 1 tablet (81 mg total) by mouth daily. 150 tablet 2  . cholecalciferol (VITAMIN D) 1000 units tablet Take 1,000 Units by mouth every morning.     Marland Kitchen  desvenlafaxine (PRISTIQ) 50 MG 24 hr tablet TAKE ONE TABLET BY MOUTH DAILY 90 tablet 1  . esomeprazole (NEXIUM) 20 MG capsule Take 20 mg by mouth daily at 12 noon.    . fluticasone (FLONASE) 50 MCG/ACT nasal spray Place 2 sprays into both nostrils daily as needed for allergies. 16 g 5  . hydrochlorothiazide (MICROZIDE) 12.5 MG capsule Take 1 capsule (12.5 mg total) by mouth daily. 90 capsule 3  . losartan (COZAAR) 100 MG tablet TAKE  ONE TABLET BY MOUTH DAILY 90 tablet 1  . metoprolol succinate (TOPROL-XL) 50 MG 24 hr tablet Take 1 tablet (50 mg total) by mouth daily. Take with or immediately following a meal. 90 tablet 3  . Multiple Vitamin (MULTIVITAMIN) tablet Take 1 tablet by mouth daily.    . polyethylene glycol powder (GLYCOLAX/MIRALAX) powder Take 17 g by mouth daily.    . Probiotic Product (ULTRAFLORA IMMUNE HEALTH PO) Take 1 capsule by mouth daily.    . rosuvastatin (CRESTOR) 40 MG tablet TAKE ONE TABLET BY MOUTH DAILY 90 tablet 1  . topiramate (TOPAMAX) 100 MG tablet TAKE ONE TABLET BY MOUTH EVERY NIGHT AT BEDTIME 90 tablet 1  . traZODone (DESYREL) 50 MG tablet TAKE ONE TABLET BY MOUTH EVERY NIGHT AT BEDTIME AS NEEDED FOR SLEEP 30 tablet 0   No current facility-administered medications on file prior to visit.     Objective:  Objective  Physical Exam Vitals and nursing note reviewed.  Constitutional:      Appearance: She is well-developed.  HENT:     Head: Normocephalic and atraumatic.  Eyes:     Conjunctiva/sclera: Conjunctivae normal.  Neck:     Thyroid: No thyromegaly.     Vascular: No carotid bruit or JVD.  Cardiovascular:     Rate and Rhythm: Normal rate and regular rhythm.     Heart sounds: Normal heart sounds. No murmur.  Pulmonary:     Effort: Pulmonary effort is normal. No respiratory distress.     Breath sounds: Normal breath sounds. No wheezing or rales.  Chest:     Chest wall: No tenderness.  Musculoskeletal:     Cervical back: Normal range of motion and neck supple.  Neurological:     Mental Status: She is alert and oriented to person, place, and time.  Psychiatric:        Mood and Affect: Mood normal.        Behavior: Behavior normal.        Thought Content: Thought content normal.    BP 104/68 (BP Location: Right Arm, Patient Position: Sitting, Cuff Size: Normal)   Pulse 73   Temp (!) 97.5 F (36.4 C) (Temporal)   Resp 18   Ht 5\' 3"  (1.6 m)   Wt 140 lb (63.5 kg)   SpO2 98%    BMI 24.80 kg/m  Wt Readings from Last 3 Encounters:  04/03/19 140 lb (63.5 kg)  02/16/19 138 lb 6 oz (62.8 kg)  02/10/19 137 lb 5 oz (62.3 kg)     Lab Results  Component Value Date   WBC 10.9 (H) 02/04/2019   HGB 13.1 02/04/2019   HCT 39.1 02/04/2019   PLT 303.0 02/04/2019   GLUCOSE 79 08/01/2018   CHOL 126 08/01/2018   TRIG 70.0 08/01/2018   HDL 48.70 08/01/2018   LDLCALC 64 08/01/2018   ALT 16 08/01/2018   AST 9 08/01/2018   NA 140 08/01/2018   K 3.8 08/01/2018   CL 104 08/01/2018   CREATININE 0.67  08/01/2018   BUN 21 08/01/2018   CO2 30 08/01/2018   TSH 1.06 08/09/2017   INR 1.0 06/27/2018   HGBA1C 5.5 09/23/2016    CT ABDOMEN PELVIS W CONTRAST  Result Date: 08/21/2018 CLINICAL DATA:  Upper GI bleed, melena EXAM: CT ABDOMEN AND PELVIS WITH CONTRAST TECHNIQUE: Multidetector CT imaging of the abdomen and pelvis was performed using the standard protocol following bolus administration of intravenous contrast. CONTRAST:  125mL ISOVUE-300 IOPAMIDOL (ISOVUE-300) INJECTION 61% oral enteric contrast COMPARISON:  None. FINDINGS: Lower chest: No acute abnormality. Hepatobiliary: No focal liver abnormality is seen. No gallstones, gallbladder wall thickening, or biliary dilatation. Pancreas: Unremarkable. No pancreatic ductal dilatation or surrounding inflammatory changes. Spleen: Normal in size without focal abnormality. Adrenals/Urinary Tract: Adrenal glands are unremarkable. Kidneys are normal, without renal calculi, focal lesion, or hydronephrosis. Bladder is unremarkable. Stomach/Bowel: Stomach is within normal limits. Appendix appears normal. No evidence of bowel wall thickening, distention, or inflammatory changes. Sigmoid diverticulosis. Large burden of stool in the colon. Vascular/Lymphatic: Calcific atherosclerosis. No enlarged abdominal or pelvic lymph nodes. Reproductive: Status post hysterectomy. Other: No abdominal wall hernia or abnormality. No abdominopelvic ascites.  Musculoskeletal: No acute or significant osseous findings. IMPRESSION: 1. No CT findings of the abdomen or pelvis to localize upper GI bleeding. Presence of oral enteric contrast in the bowel limits examination. 2.  Chronic and incidental findings as detailed above. Electronically Signed   By: Eddie Candle M.D.   On: 08/21/2018 14:14     Assessment & Plan:  Plan  I have discontinued Scharlene Corn Gemmer's amLODipine. I have also changed her varenicline. Additionally, I am having her start on amLODipine. Lastly, I am having her maintain her cholecalciferol, alendronate, polyethylene glycol powder, Probiotic Product ([place]), esomeprazole, metoprolol succinate, hydrochlorothiazide, losartan, fluticasone, topiramate, desvenlafaxine, rosuvastatin, multivitamin, traZODone, and aspirin EC.  Meds ordered this encounter  Medications  . varenicline (CHANTIX) 1 MG tablet    Sig: Take 1 tablet (1 mg total) by mouth 2 (two) times daily.    Dispense:  180 tablet    Refill:  1  . amLODipine (NORVASC) 5 MG tablet    Sig: Take 1 tablet (5 mg total) by mouth daily.    Dispense:  90 tablet    Refill:  3    Problem List Items Addressed This Visit      Unprioritized   Essential hypertension - Primary    Well controlled, no changes to meds. Encouraged heart healthy diet such as the DASH diet and exercise as tolerated.  Dec norvasc to 5 mg and f/u 3 months or sooner prn       Relevant Medications   amLODipine (NORVASC) 5 MG tablet   Other Relevant Orders   Lipid panel   Comprehensive metabolic panel   Former smoker    Refill chantix      HLD (hyperlipidemia)    Encouraged heart healthy diet, increase exercise, avoid trans fats, consider a krill oil cap daily      Relevant Medications   amLODipine (NORVASC) 5 MG tablet   Other Relevant Orders   Lipid panel   Comprehensive metabolic panel    Other Visit Diagnoses    Smoking       Relevant Medications   varenicline (CHANTIX) 1  MG tablet      Follow-up: Return in about 6 months (around 10/02/2019), or if symptoms worsen or fail to improve, for annual exam, fasting.  [name], DO

## 2019-04-03 NOTE — Patient Instructions (Addendum)

## 2019-04-03 NOTE — Assessment & Plan Note (Addendum)
Well controlled, no changes to meds. Encouraged heart healthy diet such as the DASH diet and exercise as tolerated.  Dec norvasc to 5 mg and f/u 3 months or sooner prn

## 2019-04-03 NOTE — Assessment & Plan Note (Addendum)
Encouraged heart healthy diet, increase exercise, avoid trans fats, consider a krill oil cap daily 

## 2019-04-03 NOTE — Assessment & Plan Note (Signed)
Refill chantix

## 2019-04-20 ENCOUNTER — Ambulatory Visit: Payer: BC Managed Care – PPO | Attending: Internal Medicine

## 2019-04-20 ENCOUNTER — Other Ambulatory Visit: Payer: Self-pay | Admitting: Family Medicine

## 2019-04-20 DIAGNOSIS — Z20822 Contact with and (suspected) exposure to covid-19: Secondary | ICD-10-CM

## 2019-04-21 LAB — NOVEL CORONAVIRUS, NAA: SARS-CoV-2, NAA: DETECTED — AB

## 2019-04-22 ENCOUNTER — Encounter: Payer: Self-pay | Admitting: *Deleted

## 2019-04-22 ENCOUNTER — Telehealth: Payer: Self-pay | Admitting: *Deleted

## 2019-04-22 NOTE — Telephone Encounter (Signed)
Reviewed positive Covid 19 results. She has minor cold symptoms at this time.  Discusse  additional precautions.  While staying home and away from others over the next 10 days ensure disinfecting of all common household areas often and wash your hands frequently. Drink lots of fluids to stay hydrated and rest. Any concerns with your breathing call your doctor or seek treatment at the ED with a mask on. At the end of the 10 days you can discontinue isolation as long as no fever/fever-reducing medicine over the last 48 hours and all other symptoms are improving at that time. Your local health department will be in contact with you, soon regarding your positive Covid 19 results.

## 2019-04-23 NOTE — Telephone Encounter (Signed)
Last OV 04/03/19 Last refill 03/23/19 #30/0 Next OV 10/09/19

## 2019-05-01 DIAGNOSIS — M1711 Unilateral primary osteoarthritis, right knee: Secondary | ICD-10-CM | POA: Diagnosis not present

## 2019-05-29 DIAGNOSIS — Z1231 Encounter for screening mammogram for malignant neoplasm of breast: Secondary | ICD-10-CM | POA: Diagnosis not present

## 2019-05-29 LAB — HM MAMMOGRAPHY

## 2019-06-05 ENCOUNTER — Encounter: Payer: Self-pay | Admitting: *Deleted

## 2019-06-05 DIAGNOSIS — L280 Lichen simplex chronicus: Secondary | ICD-10-CM | POA: Diagnosis not present

## 2019-06-05 DIAGNOSIS — L720 Epidermal cyst: Secondary | ICD-10-CM | POA: Diagnosis not present

## 2019-06-05 DIAGNOSIS — B078 Other viral warts: Secondary | ICD-10-CM | POA: Diagnosis not present

## 2019-06-18 ENCOUNTER — Other Ambulatory Visit: Payer: Self-pay

## 2019-06-19 ENCOUNTER — Encounter: Payer: Self-pay | Admitting: Obstetrics & Gynecology

## 2019-06-19 ENCOUNTER — Other Ambulatory Visit: Payer: Self-pay | Admitting: Family Medicine

## 2019-06-19 ENCOUNTER — Ambulatory Visit: Payer: BC Managed Care – PPO | Admitting: Obstetrics & Gynecology

## 2019-06-19 VITALS — BP 130/80 | Ht 63.0 in | Wt 142.0 lb

## 2019-06-19 DIAGNOSIS — Z01419 Encounter for gynecological examination (general) (routine) without abnormal findings: Secondary | ICD-10-CM

## 2019-06-19 DIAGNOSIS — Z780 Asymptomatic menopausal state: Secondary | ICD-10-CM

## 2019-06-19 DIAGNOSIS — Z8542 Personal history of malignant neoplasm of other parts of uterus: Secondary | ICD-10-CM

## 2019-06-19 DIAGNOSIS — Z1272 Encounter for screening for malignant neoplasm of vagina: Secondary | ICD-10-CM | POA: Diagnosis not present

## 2019-06-19 DIAGNOSIS — F418 Other specified anxiety disorders: Secondary | ICD-10-CM

## 2019-06-19 DIAGNOSIS — M810 Age-related osteoporosis without current pathological fracture: Secondary | ICD-10-CM

## 2019-06-19 DIAGNOSIS — C541 Malignant neoplasm of endometrium: Secondary | ICD-10-CM

## 2019-06-19 DIAGNOSIS — I10 Essential (primary) hypertension: Secondary | ICD-10-CM

## 2019-06-19 MED ORDER — ALENDRONATE SODIUM 70 MG PO TABS: 70.0000 mg | ORAL_TABLET | ORAL | 4 refills | Status: DC

## 2019-06-19 NOTE — Patient Instructions (Signed)
1. Encounter for Papanicolaou smear of vagina as part of routine gynecological examination Gynecologic exam status post total hysterectomy with BSO.  Pap reflex done on the vaginal vault.  Breast exam normal.  Screening mammogram normal February 2021.  Colonoscopy May 2017.  Health labs with family physician.  Good body mass index at 25.15.  Healthy nutrition discussed with patient.  [place] as a guide.  Aerobic physical activities 5 times a week and light weightlifting every 2 days.  Core muscle exercises recommended.  2. Postmenopausal Well on no hormone replacement therapy.    3. 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 Denman George March 2020.  No adjuvant therapy needed.  Alternating between Dr. Denman George and myself for follow-up every 6 months.  4. Age-related osteoporosis without current pathological fracture Last bone density May 2019 showed osteoporosis with the lowest T score at the left femoral neck at -2.7.  Patient is on Fosamax, will continue.  Vitamin D supplements, calcium intake of 1200 mg daily and regular weightbearing physical activity is recommended.  Will schedule a repeat bone density after May 2021. - DG Bone Density; Future  Other orders - Melatonin 1 MG CAPS; Take by mouth. - alendronate (FOSAMAX) 70 MG tablet; Take 1 tablet (70 mg total) by mouth every Sunday. Take with a full glass of water on an empty stomach.  Scharlene Corn, it was a pleasure seeing you today!  I will inform you of your results as soon as they are available.

## 2019-06-19 NOTE — Progress Notes (Signed)
Rebecca Gordon 1955/11/27 [id number]   History:    64 y.o. G2P2L2 Married.  Daughter getting married.  RP:  Established patient presenting for annual gyn exam v  HPI: S/P Robotic TLH/BSO/Staging with Everitt Amber 07/10/2018.  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  All LNs Negative.  Stage 1 with no adjuvant treatment needed.  Alternating between Dr Denman George and myself q6 months x 5 yrs.  Menopause on no hormone replacement therapy.    No current pelvic pain.  Breast normal.  BMI 25.15.  Healthy nutrition.  Exercising regularly, improving.  Quit smoking.  Sober from alcohol x 2 yrs.  Health labs with family physician.   Past medical history,surgical history, family history and social history were all reviewed and documented in the EPIC chart.  Gynecologic History No LMP recorded. Patient is postmenopausal.  Obstetric History OB History  Gravida Para Term Preterm AB Living  2 2       2   SAB TAB Ectopic Multiple Live Births               # Outcome Date GA Lbr Len/2nd Weight Sex Delivery Anes PTL Lv  2 Para           1 Para              ROS: A ROS was performed and pertinent positives and negatives are included in the history.  GENERAL: No fevers or chills. HEENT: No change in vision, no earache, sore throat or sinus congestion. NECK: No pain or stiffness. CARDIOVASCULAR: No chest pain or pressure. No palpitations. PULMONARY: No shortness of breath, cough or wheeze. GASTROINTESTINAL: No abdominal pain, nausea, vomiting or diarrhea, melena or bright red blood per rectum. GENITOURINARY: No urinary frequency, urgency, hesitancy or dysuria. MUSCULOSKELETAL: No joint or muscle pain, no back pain, no recent trauma. DERMATOLOGIC: No rash, no itching, no lesions. ENDOCRINE: No polyuria, polydipsia, no heat or cold intolerance. No recent change in weight. HEMATOLOGICAL: No anemia or easy  bruising or bleeding. NEUROLOGIC: No headache, seizures, numbness, tingling or weakness. PSYCHIATRIC: No depression, no loss of interest in normal activity or change in sleep pattern.     Exam:   BP 130/80   Ht 5\' 3"  (1.6 m)   Wt 142 lb (64.4 kg)   BMI 25.15 kg/m   Body mass index is 25.15 kg/m.  General appearance : Well developed well nourished female. No acute distress HEENT: Eyes: no retinal hemorrhage or exudates,  Neck supple, trachea midline, no carotid bruits, no thyroidmegaly Lungs: Clear to auscultation, no rhonchi or wheezes, or rib retractions  Heart: Regular rate and rhythm, no murmurs or gallops Breast:Examined in sitting and supine position were symmetrical in appearance, no palpable masses or tenderness,  no skin retraction, no nipple inversion, no nipple discharge, no skin discoloration, no axillary or supraclavicular lymphadenopathy Abdomen: no palpable masses or tenderness, no rebound or guarding Extremities: no edema or skin discoloration or tenderness  Pelvic: Vulva: Normal             Vagina: No gross lesions or discharge.  Pap reflex done.  Cervix/Uterus absent  Adnexa  Without masses or tenderness  Anus: Normal except external hemorrhoids   Assessment/Plan:  64 y.o. female for annual exam   1. Encounter for Papanicolaou smear of vagina as part of routine gynecological examination Gynecologic exam status post total hysterectomy with BSO.  Pap reflex done on the  vaginal vault.  Breast exam normal.  Screening mammogram normal February 2021.  Colonoscopy May 2017.  Health labs with family physician.  Good body mass index at 25.15.  Healthy nutrition discussed with patient.  [place] as a guide.  Aerobic physical activities 5 times a week and light weightlifting every 2 days.  Core muscle exercises recommended.  2. Postmenopausal Well on no hormone replacement therapy.    3. Adenocarcinoma of endometrium ([place]) Stage I grade 1 adenocarcinoma  of the endometrium status post robotic total laparoscopic hysterectomy with BSO and staging with Dr. Denman George March 2020.  No adjuvant therapy needed.  Alternating between Dr. Denman George and myself for follow-up every 6 months.  4. Age-related osteoporosis without current pathological fracture Last bone density May 2019 showed osteoporosis with the lowest T score at the left femoral neck at -2.7.  Patient is on Fosamax, will continue.  Vitamin D supplements, calcium intake of 1200 mg daily and regular weightbearing physical activity is recommended.  Will schedule a repeat bone density after May 2021. - DG Bone Density; Future  Other orders - Melatonin 1 MG CAPS; Take by mouth. - alendronate (FOSAMAX) 70 MG tablet; Take 1 tablet (70 mg total) by mouth every Sunday. Take with a full glass of water on an empty stomach.  Princess Bruins MD, 9:22 AM 06/19/2019

## 2019-06-22 LAB — PAP IG W/ RFLX HPV ASCU

## 2019-06-24 ENCOUNTER — Encounter: Payer: Self-pay | Admitting: *Deleted

## 2019-07-19 ENCOUNTER — Other Ambulatory Visit: Payer: Self-pay | Admitting: Family Medicine

## 2019-07-19 DIAGNOSIS — I10 Essential (primary) hypertension: Secondary | ICD-10-CM

## 2019-07-21 ENCOUNTER — Telehealth: Payer: Self-pay

## 2019-07-21 ENCOUNTER — Other Ambulatory Visit: Payer: Self-pay

## 2019-07-21 MED ORDER — TOPIRAMATE 100 MG PO TABS: 100.0000 mg | ORAL_TABLET | Freq: Every day | ORAL | 1 refills | Status: DC

## 2019-07-21 MED ORDER — ROSUVASTATIN CALCIUM 40 MG PO TABS: 40.0000 mg | ORAL_TABLET | Freq: Every day | ORAL | 1 refills | Status: DC

## 2019-07-21 NOTE — Telephone Encounter (Signed)
[facility] has called in to see if they can get a prescription written for a 90 day supply for  topiramate (TOPAMAX) 100 MG tablet [id number]    And for rosuvastatin (CRESTOR) 40 MG tablet [id number]    Please send it to [facility] at [place], [place] - [address]  [address], [place] [address]  Phone:  [phone] Fax:  [phone]

## 2019-07-21 NOTE — Telephone Encounter (Signed)
Correct refills sent

## 2019-08-10 DIAGNOSIS — M2740 Unspecified cyst of jaw: Secondary | ICD-10-CM | POA: Diagnosis not present

## 2019-08-19 DIAGNOSIS — M1711 Unilateral primary osteoarthritis, right knee: Secondary | ICD-10-CM | POA: Diagnosis not present

## 2019-08-27 ENCOUNTER — Other Ambulatory Visit: Payer: Self-pay | Admitting: Family Medicine

## 2019-08-27 ENCOUNTER — Other Ambulatory Visit: Payer: Self-pay

## 2019-08-27 ENCOUNTER — Telehealth: Payer: Self-pay

## 2019-08-27 DIAGNOSIS — F418 Other specified anxiety disorders: Secondary | ICD-10-CM

## 2019-08-27 NOTE — Telephone Encounter (Signed)
Refill sent.

## 2019-08-27 NOTE — Telephone Encounter (Signed)
Patient called in to get a refill for desvenlafaxine (PRISTIQ) 50 MG 24 hr tablet [id number]    Please send it to [facility] Oppen[facility] at [place], [place] - [address]  [place], [place] [address]  Phone:  [phone] Fax:  [phone]  DEA #:  --

## 2019-09-11 ENCOUNTER — Ambulatory Visit (HOSPITAL_BASED_OUTPATIENT_CLINIC_OR_DEPARTMENT_OTHER)
Admission: RE | Admit: 2019-09-11 | Discharge: 2019-09-11 | Disposition: A | Discharge status: Home / Self Care | Payer: BC Managed Care – PPO | Source: Ambulatory Visit | Attending: Family Medicine | Admitting: Family Medicine

## 2019-09-11 ENCOUNTER — Other Ambulatory Visit: Payer: Self-pay

## 2019-09-11 ENCOUNTER — Ambulatory Visit: Payer: BC Managed Care – PPO | Admitting: Family Medicine

## 2019-09-11 ENCOUNTER — Encounter: Payer: Self-pay | Admitting: Family Medicine

## 2019-09-11 VITALS — BP 110/60 | HR 71 | Temp 97.5°F | Resp 18 | Ht 63.0 in | Wt 144.6 lb

## 2019-09-11 DIAGNOSIS — M545 Low back pain, unspecified: Secondary | ICD-10-CM

## 2019-09-11 DIAGNOSIS — R8290 Unspecified abnormal findings in urine: Secondary | ICD-10-CM

## 2019-09-11 DIAGNOSIS — I10 Essential (primary) hypertension: Secondary | ICD-10-CM | POA: Diagnosis not present

## 2019-09-11 DIAGNOSIS — E785 Hyperlipidemia, unspecified: Secondary | ICD-10-CM | POA: Diagnosis not present

## 2019-09-11 LAB — CBC WITH DIFFERENTIAL/PLATELET
Basophils Absolute: 0.1 10*3/uL (ref 0.0–0.1)
Basophils Relative: 1.5 % (ref 0.0–3.0)
Eosinophils Absolute: 0.4 10*3/uL (ref 0.0–0.7)
Eosinophils Relative: 6 % — ABNORMAL HIGH (ref 0.0–5.0)
HCT: 42.5 % (ref 36.0–46.0)
Hemoglobin: 14.2 g/dL (ref 12.0–15.0)
Lymphocytes Relative: 30.6 % (ref 12.0–46.0)
Lymphs Abs: 2 10*3/uL (ref 0.7–4.0)
MCHC: 33.4 g/dL (ref 30.0–36.0)
MCV: 93.4 fl (ref 78.0–100.0)
Monocytes Absolute: 0.5 10*3/uL (ref 0.1–1.0)
Monocytes Relative: 7.6 % (ref 3.0–12.0)
Neutro Abs: 3.6 10*3/uL (ref 1.4–7.7)
Neutrophils Relative %: 54.3 % (ref 43.0–77.0)
Platelets: 233 10*3/uL (ref 150.0–400.0)
RBC: 4.55 Mil/uL (ref 3.87–5.11)
RDW: 13.6 % (ref 11.5–15.5)
WBC: 6.7 10*3/uL (ref 4.0–10.5)

## 2019-09-11 LAB — LIPID PANEL
Cholesterol: 119 mg/dL (ref 0–200)
HDL: 47.8 mg/dL (ref >39.00)
LDL Cholesterol: 63 mg/dL (ref 0–99)
NonHDL: 70.86
Total CHOL/HDL Ratio: 2
Triglycerides: 41 mg/dL (ref 0.0–149.0)
VLDL: 8.2 mg/dL (ref 0.0–40.0)

## 2019-09-11 LAB — COMPREHENSIVE METABOLIC PANEL
ALT: 19 U/L (ref 0–35)
AST: 11 U/L (ref 0–37)
Albumin: 4.2 g/dL (ref 3.5–5.2)
Alkaline Phosphatase: 75 U/L (ref 39–117)
BUN: 21 mg/dL (ref 6–23)
CO2: 28 mEq/L (ref 19–32)
Calcium: 9.2 mg/dL (ref 8.4–10.5)
Chloride: 104 mEq/L (ref 96–112)
Creatinine, Ser: 0.61 mg/dL (ref 0.40–1.20)
GFR: 98.81 mL/min (ref >60.00)
Glucose, Bld: 96 mg/dL (ref 70–99)
Potassium: 3.9 mEq/L (ref 3.5–5.1)
Sodium: 139 mEq/L (ref 135–145)
Total Bilirubin: 0.4 mg/dL (ref 0.2–1.2)
Total Protein: 6.1 g/dL (ref 6.0–8.3)

## 2019-09-11 LAB — POC URINALSYSI DIPSTICK (AUTOMATED)
Bilirubin, UA: NEGATIVE
Blood, UA: NEGATIVE
Glucose, UA: NEGATIVE
Ketones, UA: NEGATIVE
Leukocytes, UA: NEGATIVE
Nitrite, UA: NEGATIVE
Protein, UA: NEGATIVE
Spec Grav, UA: 1.01 (ref 1.010–1.025)
Urobilinogen, UA: 0.2 E.U./dL
pH, UA: 7.5 (ref 5.0–8.0)

## 2019-09-11 MED ORDER — CYCLOBENZAPRINE HCL 10 MG PO TABS: 10.0000 mg | ORAL_TABLET | Freq: Three times a day (TID) | ORAL | 0 refills | Status: DC | PRN

## 2019-09-11 NOTE — Patient Instructions (Signed)
Acute Back Pain, Adult Acute back pain is sudden and usually short-lived. It is often caused by an injury to the muscles and tissues in the back. The injury may result from:  A muscle or ligament getting overstretched or torn (strained). Ligaments are tissues that connect bones to each other. Lifting something improperly can cause a back strain.  Wear and tear (degeneration) of the spinal disks. Spinal disks are circular tissue that provides cushioning between the bones of the spine (vertebrae).  Twisting motions, such as while playing sports or doing yard work.  A hit to the back.  Arthritis. You may have a physical exam, lab tests, and imaging tests to find the cause of your pain. Acute back pain usually goes away with rest and home care. Follow these instructions at home: Managing pain, stiffness, and swelling  Take over-the-counter and prescription medicines only as told by your health care provider.  Your health care provider may recommend applying ice during the first 24-48 hours after your pain starts. To do this: ? Put ice in a plastic bag. ? Place a towel between your skin and the bag. ? Leave the ice on for 20 minutes, 2-3 times a day.  If directed, apply heat to the affected area as often as told by your health care provider. Use the heat source that your health care provider recommends, such as a moist heat pack or a heating pad. ? Place a towel between your skin and the heat source. ? Leave the heat on for 20-30 minutes. ? Remove the heat if your skin turns bright red. This is especially important if you are unable to feel pain, heat, or cold. You have a greater risk of getting burned. Activity   Do not stay in bed. Staying in bed for more than 1-2 days can delay your recovery.  Sit up and stand up straight. Avoid leaning forward when you sit, or hunching over when you stand. ? If you work at a desk, sit close to it so you do not need to lean over. Keep your chin tucked  in. Keep your neck drawn back, and keep your elbows bent at a right angle. Your arms should look like the letter "L." ? Sit high and close to the steering wheel when you drive. Add lower back (lumbar) support to your car seat, if needed.  Take short walks on even surfaces as soon as you are able. Try to increase the length of time you walk each day.  Do not sit, drive, or stand in one place for more than 30 minutes at a time. Sitting or standing for long periods of time can put stress on your back.  Do not drive or use heavy machinery while taking prescription pain medicine.  Use proper lifting techniques. When you bend and lift, use positions that put less stress on your back: ? Bend your knees. ? Keep the load close to your body. ? Avoid twisting.  Exercise regularly as told by your health care provider. Exercising helps your back heal faster and helps prevent back injuries by keeping muscles strong and flexible.  Work with a physical therapist to make a safe exercise program, as recommended by your health care provider. Do any exercises as told by your physical therapist. Lifestyle  Maintain a healthy weight. Extra weight puts stress on your back and makes it difficult to have good posture.  Avoid activities or situations that make you feel anxious or stressed. Stress and anxiety increase muscle   tension and can make back pain worse. Learn ways to manage anxiety and stress, such as through exercise. General instructions  Sleep on a firm mattress in a comfortable position. Try lying on your side with your knees slightly bent. If you lie on your back, put a pillow under your knees.  Follow your treatment plan as told by your health care provider. This may include: ? Cognitive or behavioral therapy. ? Acupuncture or massage therapy. ? Meditation or yoga. Contact a health care provider if:  You have pain that is not relieved with rest or medicine.  You have increasing pain going down  into your legs or buttocks.  Your pain does not improve after 2 weeks.  You have pain at night.  You lose weight without trying.  You have a fever or chills. Get help right away if:  You develop new bowel or bladder control problems.  You have unusual weakness or numbness in your arms or legs.  You develop nausea or vomiting.  You develop abdominal pain.  You feel faint. Summary  Acute back pain is sudden and usually short-lived.  Use proper lifting techniques. When you bend and lift, use positions that put less stress on your back.  Take over-the-counter and prescription medicines and apply heat or ice as directed by your health care provider. This information is not intended to replace advice given to you by your health care provider. Make sure you discuss any questions you have with your health care provider. Document Revised: 07/22/2018 Document Reviewed: 11/14/2016 Elsevier Patient Education  2020 [employer]  

## 2019-09-11 NOTE — Progress Notes (Signed)
Patient ID: Rebecca Gordon, female    DOB: 12-13-1955  Age: 64 y.o. MRN: [id number]    Subjective:  Subjective  HPI Rebecca Gordon presents for r hip pain that radiates to R groin.  No dysuria, no frequency.  No abd pain.  Symptoms x several weeks--- progressively worsening  Review of Systems  Constitutional: Negative for appetite change, diaphoresis, fatigue and unexpected weight change.  Eyes: Negative for pain, redness and visual disturbance.  Respiratory: Negative for cough, chest tightness, shortness of breath and wheezing.   Cardiovascular: Negative for chest pain, palpitations and leg swelling.  Gastrointestinal: Negative for abdominal distention, abdominal pain, anal bleeding, blood in stool, constipation, diarrhea, nausea, rectal pain and vomiting.  Endocrine: Negative for cold intolerance, heat intolerance, polydipsia, polyphagia and polyuria.  Genitourinary: Positive for flank pain and urgency. Negative for difficulty urinating, dysuria and frequency.  Musculoskeletal: Positive for arthralgias. Negative for gait problem.  Neurological: Negative for dizziness, light-headedness, numbness and headaches.    History Past Medical History:  Diagnosis Date  . Alcoholism ([place])   . Anal fissure   . 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 Anxiety   . Colon polyps   . Depression   . 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 Endometrial polyp   . Family history of brain cancer   . Family history of prostate cancer   . Former smoker 09/23/2016  . GERD (gastroesophageal reflux disease)    Nexium  . H/O clavicle fracture 2017   Right  . High cholesterol   . History of bronchitis   . History of pneumonia    years ago  . Hypertension   . LVH (left ventricular hypertrophy) 09/23/2016   Mild, noted on ECHO  . Numbness    left fingers after TIA  . OA (osteoarthritis)    "right knee"  . Osteoporosis 08/2017   T score -2.7  . Panic attacks   .  Postmenopausal bleeding   . Thyroid nodule 2019   Multiple  . TIA (transient ischemic attack) 09/2016  . Wears contact lenses     She has a past surgical history that includes Tonsillectomy; Placement of breast implants (Bilateral); Colonoscopy (2017); Eye surgery (Right); ORIF clavicular fracture (Right, 04/28/2015); Foot surgery (Right, 08/2017); Dilatation & curettage/hysteroscopy with myosure (N/A, 05/26/2018); Sphincterotomy (N/A, 06/24/2018); Hemorrhoid surgery (N/A, 06/24/2018); Clavicle surgery (Right, 2016); Knee arthroscopy (Right); Robotic assisted total hysterectomy with bilateral salpingo oophorectomy (Bilateral, 07/10/2018); Sentinel node biopsy (N/A, 07/10/2018); and Peridontal Laser Surgery (01/2019).   Her family history includes Brain cancer (age of onset: 52) in her father; Cancer in her paternal uncle; Heart failure in her mother; Hypertension in her father; Leukemia (age of onset: 33) in her cousin; Prostate cancer (age of onset: 55) in her father; Skin cancer in her father.She reports that she quit smoking about 9 months ago. Her smoking use included cigarettes. She has a 40.00 pack-year smoking history. She has never used smokeless tobacco. She reports previous alcohol use. She reports that she does not use drugs.  Current Outpatient Medications on File Prior to Visit  Medication Sig Dispense Refill  . alendronate (FOSAMAX) 70 MG tablet Take 1 tablet (70 mg total) by mouth every Sunday. Take with a full glass of water on an empty stomach. 12 tablet 4  . amLODipine (NORVASC) 5 MG tablet Take 1 tablet (5 mg total) by mouth daily. 90 tablet 3  . aspirin EC 81  MG tablet Take 1 tablet (81 mg total) by mouth daily. 150 tablet 2  . cholecalciferol (VITAMIN D) 1000 units tablet Take 1,000 Units by mouth every morning.     . desvenlafaxine (PRISTIQ) 50 MG 24 hr tablet TAKE ONE TABLET BY MOUTH DAILY 60 tablet 1  . esomeprazole (NEXIUM) 20 MG capsule Take 20 mg by mouth daily at 12 noon.    .  hydrochlorothiazide (MICROZIDE) 12.5 MG capsule TAKE ONE CAPSULE BY MOUTH DAILY 90 capsule 2  . losartan (COZAAR) 100 MG tablet TAKE ONE TABLET BY MOUTH DAILY 90 tablet 0  . metoprolol succinate (TOPROL-XL) 50 MG 24 hr tablet TAKE ONE TABLET BY MOUTH DAILY. TAKE WITH OR IMMEDIATELY FOLLOWING A MEAL 90 tablet 2  . polyethylene glycol (MIRALAX / GLYCOLAX) 17 g packet Take 17 g by mouth daily. Per pt once a week.    . Probiotic Product (ULTRAFLORA IMMUNE HEALTH PO) Take 1 capsule by mouth daily.    . rosuvastatin (CRESTOR) 40 MG tablet Take 1 tablet (40 mg total) by mouth daily. 90 tablet 1  . topiramate (TOPAMAX) 100 MG tablet Take 1 tablet (100 mg total) by mouth at bedtime. 90 tablet 1  . varenicline (CHANTIX) 1 MG tablet Take 1 tablet (1 mg total) by mouth 2 (two) times daily. 180 tablet 1   No current facility-administered medications on file prior to visit.     Objective:  Objective  Physical Exam Vitals reviewed.  Constitutional:      Appearance: She is well-developed.  HENT:     Head: Normocephalic and atraumatic.  Eyes:     Conjunctiva/sclera: Conjunctivae normal.  Neck:     Thyroid: No thyromegaly.     Vascular: No carotid bruit or JVD.  Cardiovascular:     Rate and Rhythm: Normal rate and regular rhythm.     Heart sounds: Normal heart sounds. No murmur.  Pulmonary:     Effort: Pulmonary effort is normal. No respiratory distress.     Breath sounds: Normal breath sounds. No wheezing or rales.  Chest:     Chest wall: No tenderness.  Musculoskeletal:     Cervical back: Normal range of motion and neck supple.  Neurological:     Mental Status: She is alert and oriented to person, place, and time.    BP 110/60 (BP Location: Right Arm, Patient Position: Sitting, Cuff Size: Normal)   Pulse 71   Temp (!) 97.5 F (36.4 C) (Temporal)   Resp 18   Ht 5\' 3"  (1.6 m)   Wt 144 lb 9.6 oz (65.6 kg)   SpO2 97%   BMI 25.61 kg/m  Wt Readings from Last 3 Encounters:  09/11/19 144 lb  9.6 oz (65.6 kg)  06/19/19 142 lb (64.4 kg)  04/03/19 140 lb (63.5 kg)     Lab Results  Component Value Date   WBC 6.7 09/11/2019   HGB 14.2 09/11/2019   HCT 42.5 09/11/2019   PLT 233.0 09/11/2019   GLUCOSE 96 09/11/2019   CHOL 119 09/11/2019   TRIG 41.0 09/11/2019   HDL 47.80 09/11/2019   LDLCALC 63 09/11/2019   ALT 19 09/11/2019   AST 11 09/11/2019   NA 139 09/11/2019   K 3.9 09/11/2019   CL 104 09/11/2019   CREATININE 0.61 09/11/2019   BUN 21 09/11/2019   CO2 28 09/11/2019   TSH 1.06 08/09/2017   INR 1.0 06/27/2018   HGBA1C 5.5 09/23/2016    CT ABDOMEN PELVIS W CONTRAST  Result Date:  08/21/2018 CLINICAL DATA:  Upper GI bleed, melena EXAM: CT ABDOMEN AND PELVIS WITH CONTRAST TECHNIQUE: Multidetector CT imaging of the abdomen and pelvis was performed using the standard protocol following bolus administration of intravenous contrast. CONTRAST:  150mL ISOVUE-300 IOPAMIDOL (ISOVUE-300) INJECTION 61% oral enteric contrast COMPARISON:  None. FINDINGS: Lower chest: No acute abnormality. Hepatobiliary: No focal liver abnormality is seen. No gallstones, gallbladder wall thickening, or biliary dilatation. Pancreas: Unremarkable. No pancreatic ductal dilatation or surrounding inflammatory changes. Spleen: Normal in size without focal abnormality. Adrenals/Urinary Tract: Adrenal glands are unremarkable. Kidneys are normal, without renal calculi, focal lesion, or hydronephrosis. Bladder is unremarkable. Stomach/Bowel: Stomach is within normal limits. Appendix appears normal. No evidence of bowel wall thickening, distention, or inflammatory changes. Sigmoid diverticulosis. Large burden of stool in the colon. Vascular/Lymphatic: Calcific atherosclerosis. No enlarged abdominal or pelvic lymph nodes. Reproductive: Status post hysterectomy. Other: No abdominal wall hernia or abnormality. No abdominopelvic ascites. Musculoskeletal: No acute or significant osseous findings. IMPRESSION: 1. No CT findings  of the abdomen or pelvis to localize upper GI bleeding. Presence of oral enteric contrast in the bowel limits examination. 2.  Chronic and incidental findings as detailed above. Electronically Signed   By: Eddie Candle M.D.   On: 08/21/2018 14:14     Assessment & Plan:  Plan  I have discontinued Scharlene Corn Tosi's polyethylene glycol powder, traZODone, and Melatonin. I am also having her start on cyclobenzaprine. Additionally, I am having her maintain her cholecalciferol, Probiotic Product ([place]), esomeprazole, aspirin EC, varenicline, amLODipine, alendronate, hydrochlorothiazide, losartan, metoprolol succinate, topiramate, rosuvastatin, desvenlafaxine, and polyethylene glycol.  Meds ordered this encounter  Medications  . cyclobenzaprine (FLEXERIL) 10 MG tablet    Sig: Take 1 tablet (10 mg total) by mouth 3 (three) times daily as needed for muscle spasms.    Dispense:  30 tablet    Refill:  0    Problem List Items Addressed This Visit      Unprioritized   Acute right-sided low back pain without sciatica - Primary    Check xray  Muscle relaxer per orders Ice/ heat  rto prn       Relevant Medications   cyclobenzaprine (FLEXERIL) 10 MG tablet   Other Relevant Orders   POCT Urinalysis Dipstick (Automated) (Completed)   DG Lumbar Spine Complete (Completed)   Lipid panel (Completed)   Comprehensive metabolic panel (Completed)   CBC with Differential/Platelet (Completed)   Essential hypertension    Well controlled, no changes to meds. Encouraged heart healthy diet such as the DASH diet and exercise as tolerated.       Relevant Orders   Lipid panel (Completed)   Comprehensive metabolic panel (Completed)   CBC with Differential/Platelet (Completed)   Foul smelling urine    Normal ua  Drink more water      Relevant Orders   Urine Culture (Completed)   HLD (hyperlipidemia)   Relevant Orders   Lipid panel (Completed)   Comprehensive metabolic panel  (Completed)   CBC with Differential/Platelet (Completed)      Follow-up: Return in about 6 months (around 03/13/2020), or if symptoms worsen or fail to improve, for cpe--- dec/ jan.  [name], DO

## 2019-09-12 LAB — URINE CULTURE
MICRO NUMBER:: 10532150
Result:: NO GROWTH
SPECIMEN QUALITY:: ADEQUATE

## 2019-09-14 DIAGNOSIS — M545 Low back pain, unspecified: Secondary | ICD-10-CM | POA: Insufficient documentation

## 2019-09-14 DIAGNOSIS — R8290 Unspecified abnormal findings in urine: Secondary | ICD-10-CM | POA: Insufficient documentation

## 2019-09-14 NOTE — Assessment & Plan Note (Signed)
Well controlled, no changes to meds. Encouraged heart healthy diet such as the DASH diet and exercise as tolerated.  °

## 2019-09-14 NOTE — Assessment & Plan Note (Signed)
Normal ua  Drink more water

## 2019-09-14 NOTE — Assessment & Plan Note (Signed)
Check xray  Muscle relaxer per orders Ice/ heat  rto prn

## 2019-09-17 DIAGNOSIS — K090 Developmental odontogenic cysts: Secondary | ICD-10-CM | POA: Diagnosis not present

## 2019-09-21 ENCOUNTER — Other Ambulatory Visit: Payer: Self-pay

## 2019-09-22 ENCOUNTER — Other Ambulatory Visit: Payer: Self-pay | Admitting: Obstetrics & Gynecology

## 2019-09-22 ENCOUNTER — Ambulatory Visit (INDEPENDENT_AMBULATORY_CARE_PROVIDER_SITE_OTHER): Payer: BC Managed Care – PPO

## 2019-09-22 DIAGNOSIS — Z780 Asymptomatic menopausal state: Secondary | ICD-10-CM

## 2019-09-22 DIAGNOSIS — M810 Age-related osteoporosis without current pathological fracture: Secondary | ICD-10-CM | POA: Diagnosis not present

## 2019-09-28 ENCOUNTER — Encounter: Payer: Self-pay | Admitting: *Deleted

## 2019-10-07 ENCOUNTER — Other Ambulatory Visit: Payer: Self-pay | Admitting: Family Medicine

## 2019-10-07 DIAGNOSIS — I10 Essential (primary) hypertension: Secondary | ICD-10-CM

## 2019-10-09 ENCOUNTER — Encounter: Payer: BC Managed Care – PPO | Admitting: Family Medicine

## 2019-10-15 DIAGNOSIS — K090 Developmental odontogenic cysts: Secondary | ICD-10-CM | POA: Diagnosis not present

## 2019-11-03 ENCOUNTER — Ambulatory Visit: Payer: BC Managed Care – PPO | Admitting: Obstetrics & Gynecology

## 2019-11-03 ENCOUNTER — Other Ambulatory Visit: Payer: Self-pay

## 2019-11-03 ENCOUNTER — Encounter: Payer: Self-pay | Admitting: Obstetrics & Gynecology

## 2019-11-03 ENCOUNTER — Telehealth: Payer: Self-pay | Admitting: *Deleted

## 2019-11-03 VITALS — BP 126/82

## 2019-11-03 DIAGNOSIS — M810 Age-related osteoporosis without current pathological fracture: Secondary | ICD-10-CM | POA: Diagnosis not present

## 2019-11-03 NOTE — Telephone Encounter (Signed)
Prolia insurance verification has been sent awaiting Summary of benefits  

## 2019-11-03 NOTE — Telephone Encounter (Signed)
-----   Message from Princess Bruins, MD sent at 11/03/2019  4:10 PM EDT ----- Regarding: Osteoporosis On Fosamax x 1 year with worsening GERD and Gingivitis.  Needs to stop Fosamax and start on Prolia. Ca++ 9.2 on 09/11/2019.

## 2019-11-03 NOTE — Progress Notes (Signed)
    Allura Doepke [place] 11-03-1955 [id number]        64 y.o.  G2P2L2   RP: Osteoporosis counseling and management  HPI: On Fosamax x 1 year with worsening GERD.  Gingivitis.  Needs to change Bone Therapy.   OB History  Gravida Para Term Preterm AB Living  2 2       2   SAB TAB Ectopic Multiple Live Births               # Outcome Date GA Lbr Len/2nd Weight Sex Delivery Anes PTL Lv  2 Para           1 Para             Past medical history,surgical history, problem list, medications, allergies, family history and social history were all reviewed and documented in the EPIC chart.   Directed ROS with pertinent positives and negatives documented in the history of present illness/assessment and plan.  Exam:  Vitals:   11/03/19 1530  BP: 126/82   General appearance:  Normal  Bone Density 09/22/2019:  Osteoporosis with T-Score at -2.5 at the Left Femoral Neck.  Significant improvement at the Spine c/w 2019.   Assessment/Plan:  64 y.o. G2P2   1. Age-related osteoporosis without current pathological fracture Osteoporosis with T-Score at -2.5 at the Lt Femoral Neck.  On Fosamax x 1 year with worsening GERD and Gingivitis.  Needs to change Bone Therapy.  Counseling on Prolia.  Usage/risks/benefits thoroughly reviewed.  Patient agrees with plan.  Ca++ normal at 9.2 in 08/2019.   Princess Bruins MD, 4:14 PM 11/03/2019

## 2019-11-04 ENCOUNTER — Encounter: Payer: Self-pay | Admitting: Obstetrics & Gynecology

## 2019-11-11 NOTE — Telephone Encounter (Addendum)
Deductible N/A  OOP MAX N/A  Annual exam 06/19/2019  Calcium  9.2           Date 09/11/2019  Upcoming dental procedures   Prior Authorization needed YES APROVED  Pt estimated Cost $0  appt 11/13/2019     Coverage Details:0% ONE DOSE, 0% ADMIN FEE

## 2019-11-13 ENCOUNTER — Ambulatory Visit (INDEPENDENT_AMBULATORY_CARE_PROVIDER_SITE_OTHER): Payer: BC Managed Care – PPO | Admitting: *Deleted

## 2019-11-13 ENCOUNTER — Other Ambulatory Visit: Payer: Self-pay

## 2019-11-13 DIAGNOSIS — M810 Age-related osteoporosis without current pathological fracture: Secondary | ICD-10-CM | POA: Diagnosis not present

## 2019-11-13 MED ORDER — DENOSUMAB 60 MG/ML ~~LOC~~ SOSY
60.0000 mg | PREFILLED_SYRINGE | Freq: Once | SUBCUTANEOUS | Status: AC
  Administered 2019-11-13: 60 mg via SUBCUTANEOUS

## 2019-12-16 ENCOUNTER — Other Ambulatory Visit: Payer: Self-pay | Admitting: Family Medicine

## 2019-12-16 DIAGNOSIS — I10 Essential (primary) hypertension: Secondary | ICD-10-CM

## 2019-12-16 DIAGNOSIS — F418 Other specified anxiety disorders: Secondary | ICD-10-CM

## 2020-02-12 ENCOUNTER — Other Ambulatory Visit: Payer: Self-pay | Admitting: Family Medicine

## 2020-02-13 ENCOUNTER — Other Ambulatory Visit: Payer: Self-pay | Admitting: Family Medicine

## 2020-02-13 DIAGNOSIS — F418 Other specified anxiety disorders: Secondary | ICD-10-CM

## 2020-03-04 NOTE — Telephone Encounter (Signed)
prolia given 11/13/2019 Next injection 05/16/2020

## 2020-03-12 ENCOUNTER — Other Ambulatory Visit: Payer: Self-pay | Admitting: Family Medicine

## 2020-03-12 DIAGNOSIS — I10 Essential (primary) hypertension: Secondary | ICD-10-CM

## 2020-03-14 ENCOUNTER — Other Ambulatory Visit: Payer: Self-pay | Admitting: Family Medicine

## 2020-03-14 DIAGNOSIS — I10 Essential (primary) hypertension: Secondary | ICD-10-CM

## 2020-03-18 ENCOUNTER — Encounter: Payer: BC Managed Care – PPO | Admitting: Family Medicine

## 2020-04-05 ENCOUNTER — Other Ambulatory Visit: Payer: Self-pay | Admitting: Family Medicine

## 2020-04-05 DIAGNOSIS — I10 Essential (primary) hypertension: Secondary | ICD-10-CM

## 2020-04-27 ENCOUNTER — Telehealth: Payer: Self-pay | Admitting: *Deleted

## 2020-04-27 NOTE — Telephone Encounter (Signed)
Left message for return call. Update insurance information.  [employer] sent letter saying insurance was terminated unable to run summary of benefits for Prolia

## 2020-05-03 NOTE — Telephone Encounter (Signed)
Prolia insurance verification has been sent awaiting Summary of benefits   new insurance [employer]  [phone] Provider [phone]

## 2020-05-06 ENCOUNTER — Encounter: Payer: Self-pay | Admitting: Family Medicine

## 2020-05-06 ENCOUNTER — Ambulatory Visit (INDEPENDENT_AMBULATORY_CARE_PROVIDER_SITE_OTHER): Payer: 59 | Admitting: Family Medicine

## 2020-05-06 ENCOUNTER — Other Ambulatory Visit: Payer: Self-pay

## 2020-05-06 VITALS — BP 120/70 | HR 82 | Temp 98.6°F | Resp 18 | Ht 63.0 in | Wt 147.4 lb

## 2020-05-06 DIAGNOSIS — F17210 Nicotine dependence, cigarettes, uncomplicated: Secondary | ICD-10-CM | POA: Diagnosis not present

## 2020-05-06 DIAGNOSIS — E785 Hyperlipidemia, unspecified: Secondary | ICD-10-CM

## 2020-05-06 DIAGNOSIS — F321 Major depressive disorder, single episode, moderate: Secondary | ICD-10-CM

## 2020-05-06 DIAGNOSIS — Z0000 Encounter for general adult medical examination without abnormal findings: Secondary | ICD-10-CM

## 2020-05-06 DIAGNOSIS — Z23 Encounter for immunization: Secondary | ICD-10-CM | POA: Diagnosis not present

## 2020-05-06 DIAGNOSIS — H9193 Unspecified hearing loss, bilateral: Secondary | ICD-10-CM

## 2020-05-06 DIAGNOSIS — I10 Essential (primary) hypertension: Secondary | ICD-10-CM

## 2020-05-06 MED ORDER — HYDROCHLOROTHIAZIDE 12.5 MG PO CAPS: 12.5000 mg | ORAL_CAPSULE | Freq: Every day | ORAL | 3 refills | Status: DC

## 2020-05-06 MED ORDER — BUPROPION HCL ER (XL) 150 MG PO TB24: ORAL_TABLET | ORAL | 2 refills | Status: DC

## 2020-05-06 MED ORDER — HYDROCHLOROTHIAZIDE 25 MG PO TABS: 25.0000 mg | ORAL_TABLET | Freq: Every day | ORAL | 3 refills | Status: DC

## 2020-05-06 NOTE — Progress Notes (Signed)
Subjective:     Rebecca Gordon is a 65 y.o. female and is here for a comprehensive physical exam. The patient reports problems - dec hearing and ringing in her ears .   she also c/o trouble losing weight.  she has tried [employer] in past and my fitness pal.  Social History   Socioeconomic History  . Marital status: Married    Spouse name: Not on file  . Number of children: 2  . Years of education: College  . Highest education level: Not on file  Occupational History  . Occupation: Unemployed  Tobacco Use  . Smoking status: Former Smoker    Packs/day: 1.00    Years: 40.00    Pack years: 40.00    Types: Cigarettes    Quit date: 11/2018    Years since quitting: 1.4  . Smokeless tobacco: Never Used  . Tobacco comment: Previous 1 pack a day  Vaping Use  . Vaping Use: Never used  Substance and Sexual Activity  . Alcohol use: Not Currently    Comment: daily - 1 bottle of wine--- 09/23/2016, 3-4 glasses--- 10/15/16  . Drug use: No  . Sexual activity: Yes    Birth control/protection: Post-menopausal    Comment: 1st intercourse- 71, partners- 71, married- 64 yrs   Other Topics Concern  . Not on file  Social History Narrative   Lives at home w/ her husband   Right-handed   Caffeine: 2-3 cups per day   Social Determinants of Health   Financial Resource Strain: Not on file  Food Insecurity: Not on file  Transportation Needs: Not on file  Physical Activity: Not on file  Stress: Not on file  Social Connections: Not on file  Intimate Partner Violence: Not on file   Health Maintenance  Topic Date Due  . COVID-19 Vaccine (1) Never done  . INFLUENZA VACCINE  11/15/2019  . MAMMOGRAM  05/28/2020  . COLONOSCOPY (Pts 45-72yrs Insurance coverage will need to be confirmed)  09/01/2020  . PAP SMEAR-Modifier  06/13/2021  . TETANUS/TDAP  05/06/2030  . Hepatitis C Screening  Completed  . HIV Screening  Completed    The following portions of the patient's history were reviewed and updated as  appropriate:  She  has a past medical history of Alcoholism ([place]), Anal fissure, Aneurysm ([place]) (09/2016), Anxiety, Colon polyps, Depression, Endometrial cancer ([place]), Endometrial polyp, Family history of brain cancer, Family history of prostate cancer, Former smoker (09/23/2016), GERD (gastroesophageal reflux disease), H/O clavicle fracture (2017), High cholesterol, History of bronchitis, History of pneumonia, Hypertension, LVH (left ventricular hypertrophy) (09/23/2016), Numbness, OA (osteoarthritis), Osteoporosis (08/2017), Panic attacks, Postmenopausal bleeding, Thyroid nodule (2019), TIA (transient ischemic attack) (09/2016), and Wears contact lenses. She does not have any pertinent problems on file. She  has a past surgical history that includes Tonsillectomy; Placement of breast implants (Bilateral); Colonoscopy (2017); Eye surgery (Right); ORIF clavicular fracture (Right, 04/28/2015); Foot surgery (Right, 08/2017); Dilatation & curettage/hysteroscopy with myosure (N/A, 05/26/2018); Sphincterotomy (N/A, 06/24/2018); Hemorrhoid surgery (N/A, 06/24/2018); Clavicle surgery (Right, 2016); Knee arthroscopy (Right); Robotic assisted total hysterectomy with bilateral salpingo oophorectomy (Bilateral, 07/10/2018); Sentinel node biopsy (N/A, 07/10/2018); and Peridontal Laser Surgery (01/2019). Her family history includes Brain cancer (age of onset: 80) in her father; Cancer in her paternal uncle; Heart failure in her mother; Hypertension in her father; Leukemia (age of onset: 52) in her cousin; Prostate cancer (age of onset: 57) in her father; Skin cancer in her father. She  reports that she quit smoking about  17 months ago. Her smoking use included cigarettes. She has a 40.00 pack-year smoking history. She has never used smokeless tobacco. She reports previous alcohol use. She reports that she does not use drugs. She has a current medication list which includes the following prescription(s): alendronate, amlodipine,  bupropion, cholecalciferol, desvenlafaxine, esomeprazole, hydrochlorothiazide, losartan, metoprolol succinate, probiotic product, rosuvastatin, polyethylene glycol, and topiramate. Current Outpatient Medications on File Prior to Visit  Medication Sig Dispense Refill  . alendronate (FOSAMAX) 70 MG tablet Take 1 tablet (70 mg total) by mouth every Sunday. Take with a full glass of water on an empty stomach. 12 tablet 4  . amLODipine (NORVASC) 5 MG tablet TAKE ONE TABLET BY MOUTH DAILY 90 tablet 3  . cholecalciferol (VITAMIN D) 1000 units tablet Take 1,000 Units by mouth every morning.     . desvenlafaxine (PRISTIQ) 50 MG 24 hr tablet Take 1 tablet (50 mg total) by mouth daily. 180 tablet 0  . esomeprazole (NEXIUM) 20 MG capsule Take 20 mg by mouth daily at 12 noon.    Marland Kitchen losartan (COZAAR) 100 MG tablet TAKE ONE TABLET BY MOUTH DAILY 90 tablet 0  . metoprolol succinate (TOPROL-XL) 50 MG 24 hr tablet TAKE ONE TABLET BY MOUTH DAILY. TAKE WITH OR IMMEDIATELY FOLLOWING A MEAL 90 tablet 2  . Probiotic Product (ULTRAFLORA IMMUNE HEALTH PO) Take 1 capsule by mouth daily.    . rosuvastatin (CRESTOR) 40 MG tablet Take 1 tablet (40 mg total) by mouth daily. 90 tablet 1  . polyethylene glycol (MIRALAX / GLYCOLAX) 17 g packet Take 17 g by mouth daily. Per pt once a week. (Patient not taking: Reported on 05/06/2020)    . topiramate (TOPAMAX) 100 MG tablet Take 1 tablet (100 mg total) by mouth at bedtime. (Patient not taking: Reported on 05/06/2020) 90 tablet 1   No current facility-administered medications on file prior to visit.   She is allergic to augmentin  [amoxicillin-pot clavulanate]..  Review of Systems. Review of Systems  Constitutional: Negative for activity change, appetite change and fatigue.  HENT: Negative for hearing loss, congestion, tinnitus and ear discharge.  dentist q55m Eyes: Negative for visual disturbance (see optho q1y -- vision corrected to 20/20 with glasses).  Respiratory: Negative for  cough, chest tightness and shortness of breath.   Cardiovascular: Negative for chest pain, palpitations and leg swelling.  Gastrointestinal: Negative for abdominal pain, diarrhea, constipation and abdominal distention.  Genitourinary: Negative for urgency, frequency, decreased urine volume and difficulty urinating.  Musculoskeletal: Negative for back pain, arthralgias and gait problem.  Skin: Negative for color change, pallor and rash.  Neurological: Negative for dizziness, light-headedness, numbness and headaches.  Hematological: Negative for adenopathy. Does not bruise/bleed easily.  Psychiatric/Behavioral: Negative for suicidal ideas, confusion, sleep disturbance, self-injury, dysphoric mood, decreased concentration and agitation.        Objective:    BP 120/70 (BP Location: Right Arm, Patient Position: Sitting, Cuff Size: Normal)   Pulse 82   Temp 98.6 F (37 C) (Oral)   Resp 18   Ht 5\' 3"  (1.6 m)   Wt 147 lb 6.4 oz (66.9 kg)   SpO2 96%   BMI 26.11 kg/m  General appearance: alert, cooperative, appears stated age and no distress Head: Normocephalic, without obvious abnormality, atraumatic Eyes: conjunctivae/corneas clear. PERRL, EOM's intact. Fundi benign. Ears: normal TM's and external ear canals both ears Neck: no adenopathy, no carotid bruit, no JVD, supple, symmetrical, trachea midline and thyroid not enlarged, symmetric, no tenderness/mass/nodules Back: symmetric, no curvature. ROM  normal. No CVA tenderness. Lungs: clear to auscultation bilaterally Breasts: gyn Heart: regular rate and rhythm, S1, S2 normal, no murmur, click, rub or gallop Abdomen: soft, non-tender; bowel sounds normal; no masses,  no organomegaly Pelvic: deferred --gyn Extremities: extremities normal, atraumatic, no cyanosis or edema Pulses: 2+ and symmetric Skin: Skin color, texture, turgor normal. No rashes or lesions Lymph nodes: Cervical, supraclavicular, and axillary nodes normal. Neurologic:  Alert and oriented X 3, normal strength and tone. Normal symmetric reflexes. Normal coordination and gait    Assessment:    Healthy female exam.      Plan:    ghm utd Check labs  See After Visit Summary for Counseling Recommendations    1. Essential hypertension Well controlled, no changes to meds. Encouraged heart healthy diet such as the DASH diet and exercise as tolerated.  - TSH; Future - CBC with Differential/Platelet; Future - Comprehensive metabolic panel; Future - hydrochlorothiazide (HYDRODIURIL) 25 MG tablet; Take 1 tablet (25 mg total) by mouth daily.  Dispense: 90 tablet; Refill: 3  2. Hyperlipidemia, unspecified hyperlipidemia type Encouraged heart healthy diet, increase exercise, avoid trans fats, consider a krill oil cap daily - Lipid panel; Future - Comprehensive metabolic panel; Future  3. Depression, major, single episode, moderate (HCC) Stable con't tx  4. Smoking greater than 30 pack years Using patch because she started smoking again  Add wellbutrin and f/u in 1 month - buPROPion (WELLBUTRIN XL) 150 MG 24 hr tablet; 1 po qd x  1 week then increase to 2  Dispense: 60 each; Refill: 2  5. Bilateral hearing loss, unspecified hearing loss type Refer to audiology  - Ambulatory referral to Audiology  6. Need for tetanus booster   - Tdap vaccine greater than or equal to 7yo IM  7. Preventative health care See above Pt did receive flu shot and covid shot--- she will get the info to [place]

## 2020-05-06 NOTE — Patient Instructions (Signed)
Preventive Care 40-64 Years Old, Female Preventive care refers to lifestyle choices and visits with your health care provider that can promote health and wellness. This includes:  A yearly physical exam. This is also called an annual wellness visit.  Regular dental and eye exams.  Immunizations.  Screening for certain conditions.  Healthy lifestyle choices, such as: ? Eating a healthy diet. ? Getting regular exercise. ? Not using drugs or products that contain nicotine and tobacco. ? Limiting alcohol use. What can I expect for my preventive care visit? Physical exam Your health care provider will check your:  Height and weight. These may be used to calculate your BMI (body mass index). BMI is a measurement that tells if you are at a healthy weight.  Heart rate and blood pressure.  Body temperature.  Skin for abnormal spots. Counseling Your health care provider may ask you questions about your:  Past medical problems.  Family's medical history.  Alcohol, tobacco, and drug use.  Emotional well-being.  Home life and relationship well-being.  Sexual activity.  Diet, exercise, and sleep habits.  Work and work environment.  Access to firearms.  Method of birth control.  Menstrual cycle.  Pregnancy history. What immunizations do I need? Vaccines are usually given at various ages, according to a schedule. Your health care provider will recommend vaccines for you based on your age, medical history, and lifestyle or other factors, such as travel or where you work.   What tests do I need? Blood tests  Lipid and cholesterol levels. These may be checked every 5 years, or more often if you are over 65 years old.  Hepatitis C test.  Hepatitis B test. Screening  Lung cancer screening. You may have this screening every year starting at age 65 if you have a 30-pack-year history of smoking and currently smoke or have quit within the past 15 years.  Colorectal cancer  screening. ? All adults should have this screening starting at age 65 and continuing until age 75. ? Your health care provider may recommend screening at age 65 if you are at increased risk. ? You will have tests every 1-10 years, depending on your results and the type of screening test.  Diabetes screening. ? This is done by checking your blood sugar (glucose) after you have not eaten for a while (fasting). ? You may have this done every 1-3 years.  Mammogram. ? This may be done every 1-2 years. ? Talk with your health care provider about when you should start having regular mammograms. This may depend on whether you have a family history of breast cancer.  BRCA-related cancer screening. This may be done if you have a family history of breast, ovarian, tubal, or peritoneal cancers.  Pelvic exam and Pap test. ? This may be done every 3 years starting at age 65. ? Starting at age 30, this may be done every 5 years if you have a Pap test in combination with an HPV test. Other tests  STD (sexually transmitted disease) testing, if you are at risk.  Bone density scan. This is done to screen for osteoporosis. You may have this scan if you are at high risk for osteoporosis. Talk with your health care provider about your test results, treatment options, and if necessary, the need for more tests. Follow these instructions at home: Eating and drinking  Eat a diet that includes fresh fruits and vegetables, whole grains, lean protein, and low-fat dairy products.  Take vitamin and mineral supplements   as recommended by your health care provider.  Do not drink alcohol if: ? Your health care provider tells you not to drink. ? You are pregnant, may be pregnant, or are planning to become pregnant.  If you drink alcohol: ? Limit how much you have to 0-1 drink a day. ? Be aware of how much alcohol is in your drink. In the [place] one drink equals one 12 oz bottle of beer (355 mL), one 5 oz glass of  wine (148 mL), or one 1 oz glass of hard liquor (44 mL).   Lifestyle  Take daily care of your teeth and gums. Brush your teeth every morning and night with fluoride toothpaste. Floss one time each day.  Stay active. Exercise for at least 30 minutes 5 or more days each week.  Do not use any products that contain nicotine or tobacco, such as cigarettes, e-cigarettes, and chewing tobacco. If you need help quitting, ask your health care provider.  Do not use drugs.  If you are sexually active, practice safe sex. Use a condom or other form of protection to prevent STIs (sexually transmitted infections).  If you do not wish to become pregnant, use a form of birth control. If you plan to become pregnant, see your health care provider for a prepregnancy visit.  If told by your health care provider, take low-dose aspirin daily starting at age 65.  Find healthy ways to cope with stress, such as: ? Meditation, yoga, or listening to music. ? Journaling. ? Talking to a trusted person. ? Spending time with friends and family. Safety  Always wear your seat belt while driving or riding in a vehicle.  Do not drive: ? If you have been drinking alcohol. Do not ride with someone who has been drinking. ? When you are tired or distracted. ? While texting.  Wear a helmet and other protective equipment during sports activities.  If you have firearms in your house, make sure you follow all gun safety procedures. What's next?  Visit your health care provider once a year for an annual wellness visit.  Ask your health care provider how often you should have your eyes and teeth checked.  Stay up to date on all vaccines. This information is not intended to replace advice given to you by your health care provider. Make sure you discuss any questions you have with your health care provider. Document Revised: 01/05/2020 Document Reviewed: 12/12/2017 Elsevier Patient Education  2021 Elsevier Inc.   

## 2020-05-07 DIAGNOSIS — H9193 Unspecified hearing loss, bilateral: Secondary | ICD-10-CM | POA: Insufficient documentation

## 2020-05-07 DIAGNOSIS — F321 Major depressive disorder, single episode, moderate: Secondary | ICD-10-CM | POA: Insufficient documentation

## 2020-05-13 ENCOUNTER — Other Ambulatory Visit: Payer: Self-pay | Admitting: Family Medicine

## 2020-05-13 ENCOUNTER — Other Ambulatory Visit (INDEPENDENT_AMBULATORY_CARE_PROVIDER_SITE_OTHER): Payer: 59

## 2020-05-13 ENCOUNTER — Other Ambulatory Visit: Payer: Self-pay

## 2020-05-13 DIAGNOSIS — I10 Essential (primary) hypertension: Secondary | ICD-10-CM | POA: Diagnosis not present

## 2020-05-13 DIAGNOSIS — E785 Hyperlipidemia, unspecified: Secondary | ICD-10-CM

## 2020-05-13 LAB — COMPREHENSIVE METABOLIC PANEL
ALT: 23 U/L (ref 0–35)
AST: 14 U/L (ref 0–37)
Albumin: 3.9 g/dL (ref 3.5–5.2)
Alkaline Phosphatase: 51 U/L (ref 39–117)
BUN: 21 mg/dL (ref 6–23)
CO2: 30 mEq/L (ref 19–32)
Calcium: 8.9 mg/dL (ref 8.4–10.5)
Chloride: 105 mEq/L (ref 96–112)
Creatinine, Ser: 0.59 mg/dL (ref 0.40–1.20)
GFR: 95.21 mL/min (ref >60.00)
Glucose, Bld: 105 mg/dL — ABNORMAL HIGH (ref 70–99)
Potassium: 4.1 mEq/L (ref 3.5–5.1)
Sodium: 141 mEq/L (ref 135–145)
Total Bilirubin: 0.4 mg/dL (ref 0.2–1.2)
Total Protein: 5.9 g/dL — ABNORMAL LOW (ref 6.0–8.3)

## 2020-05-13 LAB — LIPID PANEL
Cholesterol: 118 mg/dL (ref 0–200)
HDL: 45.6 mg/dL (ref >39.00)
LDL Cholesterol: 62 mg/dL (ref 0–99)
NonHDL: 72.48
Total CHOL/HDL Ratio: 3
Triglycerides: 50 mg/dL (ref 0.0–149.0)
VLDL: 10 mg/dL (ref 0.0–40.0)

## 2020-05-13 LAB — CBC WITH DIFFERENTIAL/PLATELET
Basophils Absolute: 0.1 10*3/uL (ref 0.0–0.1)
Basophils Relative: 2.2 % (ref 0.0–3.0)
Eosinophils Absolute: 0.4 10*3/uL (ref 0.0–0.7)
Eosinophils Relative: 5.6 % — ABNORMAL HIGH (ref 0.0–5.0)
HCT: 42 % (ref 36.0–46.0)
Hemoglobin: 14.4 g/dL (ref 12.0–15.0)
Lymphocytes Relative: 30.6 % (ref 12.0–46.0)
Lymphs Abs: 2 10*3/uL (ref 0.7–4.0)
MCHC: 34.2 g/dL (ref 30.0–36.0)
MCV: 92.6 fl (ref 78.0–100.0)
Monocytes Absolute: 0.6 10*3/uL (ref 0.1–1.0)
Monocytes Relative: 9 % (ref 3.0–12.0)
Neutro Abs: 3.5 10*3/uL (ref 1.4–7.7)
Neutrophils Relative %: 52.6 % (ref 43.0–77.0)
Platelets: 249 10*3/uL (ref 150.0–400.0)
RBC: 4.54 Mil/uL (ref 3.87–5.11)
RDW: 13.1 % (ref 11.5–15.5)
WBC: 6.6 10*3/uL (ref 4.0–10.5)

## 2020-05-13 LAB — TSH: TSH: 1.18 u[IU]/mL (ref 0.35–4.50)

## 2020-05-16 NOTE — Telephone Encounter (Addendum)
Deductible $0 met  OOP MAX $3600 ($43mt)  Annual exam 06/19/2019 ml  Calcium 8.9            Date 05/13/2020  Upcoming dental procedures NO  Prior Authorization needed yes approved 1-31/2022-05/16/2021 auth # [id number] Pt estimated Cost $490  appt 06/03/2020      Coverage Details: 25% of one dose, no admin fee

## 2020-05-18 ENCOUNTER — Other Ambulatory Visit: Payer: Self-pay | Admitting: *Deleted

## 2020-05-18 DIAGNOSIS — I10 Essential (primary) hypertension: Secondary | ICD-10-CM

## 2020-05-18 MED ORDER — LOSARTAN POTASSIUM 100 MG PO TABS: 100.0000 mg | ORAL_TABLET | Freq: Every day | ORAL | 1 refills | Status: DC

## 2020-06-03 ENCOUNTER — Ambulatory Visit (INDEPENDENT_AMBULATORY_CARE_PROVIDER_SITE_OTHER): Payer: 59 | Admitting: Anesthesiology

## 2020-06-03 ENCOUNTER — Other Ambulatory Visit: Payer: Self-pay

## 2020-06-03 DIAGNOSIS — M810 Age-related osteoporosis without current pathological fracture: Secondary | ICD-10-CM

## 2020-06-03 MED ORDER — DENOSUMAB 60 MG/ML ~~LOC~~ SOSY
60.0000 mg | PREFILLED_SYRINGE | Freq: Once | SUBCUTANEOUS | Status: AC
  Administered 2020-06-03: 60 mg via SUBCUTANEOUS

## 2020-06-17 LAB — HM MAMMOGRAPHY

## 2020-06-24 ENCOUNTER — Encounter: Payer: BC Managed Care – PPO | Admitting: Obstetrics & Gynecology

## 2020-07-14 ENCOUNTER — Ambulatory Visit: Payer: BC Managed Care – PPO | Admitting: Obstetrics & Gynecology

## 2020-08-03 ENCOUNTER — Other Ambulatory Visit: Payer: Self-pay | Admitting: Family Medicine

## 2020-08-03 DIAGNOSIS — F17210 Nicotine dependence, cigarettes, uncomplicated: Secondary | ICD-10-CM

## 2020-08-03 DIAGNOSIS — F418 Other specified anxiety disorders: Secondary | ICD-10-CM

## 2020-08-26 ENCOUNTER — Encounter: Payer: Self-pay | Admitting: Obstetrics & Gynecology

## 2020-08-26 ENCOUNTER — Ambulatory Visit (INDEPENDENT_AMBULATORY_CARE_PROVIDER_SITE_OTHER): Payer: 59 | Admitting: Obstetrics & Gynecology

## 2020-08-26 ENCOUNTER — Other Ambulatory Visit: Payer: Self-pay

## 2020-08-26 ENCOUNTER — Other Ambulatory Visit (HOSPITAL_COMMUNITY)
Admission: RE | Admit: 2020-08-26 | Discharge: 2020-08-26 | Disposition: A | Discharge status: Home / Self Care | Payer: 59 | Source: Ambulatory Visit | Attending: Obstetrics & Gynecology | Admitting: Obstetrics & Gynecology

## 2020-08-26 VITALS — BP 126/82 | Ht 62.5 in | Wt 139.0 lb

## 2020-08-26 DIAGNOSIS — C541 Malignant neoplasm of endometrium: Secondary | ICD-10-CM | POA: Diagnosis not present

## 2020-08-26 DIAGNOSIS — Z1272 Encounter for screening for malignant neoplasm of vagina: Secondary | ICD-10-CM

## 2020-08-26 DIAGNOSIS — M810 Age-related osteoporosis without current pathological fracture: Secondary | ICD-10-CM | POA: Diagnosis not present

## 2020-08-26 DIAGNOSIS — Z01419 Encounter for gynecological examination (general) (routine) without abnormal findings: Secondary | ICD-10-CM | POA: Diagnosis not present

## 2020-08-26 DIAGNOSIS — Z780 Asymptomatic menopausal state: Secondary | ICD-10-CM | POA: Diagnosis not present

## 2020-08-26 NOTE — Progress Notes (Signed)
Rebecca Gordon [place] 1955-07-19 [id number]   History:    65 y.o. G2P2L2 Married. Daughter getting married.  GY:[id number] presenting for annual gyn exam v  HPI:S/P Robotic TLH/BSO/Staging with Everitt Amber 07/10/2018.  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  All LNs Negative.  Stage 1 with no adjuvant treatment needed.  Alternating between Dr Denman George and myself q6 months x 5 yrs. Postmenopause on no hormone replacement therapy.   No current pelvic pain.  BD 09/2019 showed Osteoporosis worsening on Fosamax.  Changed to Prolia.  Received last injection 05/2020. Breast normal. BMI 25.02.  Healthy nutrition.  Exercising regularly.  Quit smoking.  Sober from alcohol x 3 yrs. Health labs with family physician.  Past medical history,surgical history, family history and social history were all reviewed and documented in the EPIC chart.  Gynecologic History No LMP recorded. Patient is postmenopausal.  Obstetric History OB History  Gravida Para Term Preterm AB Living  2 2       2   SAB IAB Ectopic Multiple Live Births               # Outcome Date GA Lbr Len/2nd Weight Sex Delivery Anes PTL Lv  2 Para           1 Para              ROS: A ROS was performed and pertinent positives and negatives are included in the history.  GENERAL: No fevers or chills. HEENT: No change in vision, no earache, sore throat or sinus congestion. NECK: No pain or stiffness. CARDIOVASCULAR: No chest pain or pressure. No palpitations. PULMONARY: No shortness of breath, cough or wheeze. GASTROINTESTINAL: No abdominal pain, nausea, vomiting or diarrhea, melena or bright red blood per rectum. GENITOURINARY: No urinary frequency, urgency, hesitancy or dysuria. MUSCULOSKELETAL: No joint or muscle pain, no back pain, no recent trauma. DERMATOLOGIC: No rash, no itching, no lesions. ENDOCRINE: No polyuria,  polydipsia, no heat or cold intolerance. No recent change in weight. HEMATOLOGICAL: No anemia or easy bruising or bleeding. NEUROLOGIC: No headache, seizures, numbness, tingling or weakness. PSYCHIATRIC: No depression, no loss of interest in normal activity or change in sleep pattern.     Exam:   BP 126/82   Ht 5' 2.5" (1.588 m)   Wt 139 lb (63 kg)   BMI 25.02 kg/m   Body mass index is 25.02 kg/m.  General appearance : Well developed well nourished female. No acute distress HEENT: Eyes: no retinal hemorrhage or exudates,  Neck supple, trachea midline, no carotid bruits, no thyroidmegaly Lungs: Clear to auscultation, no rhonchi or wheezes, or rib retractions  Heart: Regular rate and rhythm, no murmurs or gallops Breast:Examined in sitting and supine position were symmetrical in appearance, no palpable masses or tenderness,  no skin retraction, no nipple inversion, no nipple discharge, no skin discoloration, no axillary or supraclavicular lymphadenopathy Abdomen: no palpable masses or tenderness, no rebound or guarding Extremities: no edema or skin discoloration or tenderness  Pelvic: Vulva: Normal             Vagina: No gross lesions or discharge.  Pap reflex done.  Cervix/Uterus absent  Adnexa  Without masses or tenderness  Anus: Normal   Assessment/Plan:  65 y.o. female for annual exam   1. Encounter for Papanicolaou smear of vagina as part of routine gynecological examination Gynecologic exam s/p Total Hysterectomy/BSO/Staging.  Pap reflex done on the vaginal  vault.  Breast exam normal.  Screening mammo 06/2020.  [place] 2017.  BMI 25.2.  Continue with healthy nutrition/Fitness. - Cytology - PAP( [facility])  2. Adenocarcinoma of endometrium ([place]) - Cytology - PAP( [facility])  3. Postmenopausal Well on no HRT.  4. Age-related osteoporosis without current pathological fracture Last BD 09/2019.  Well on Prolia.  Next injection due 11/2020. Vit D supplements, Ca++ 1.5 g/d  total, continue weight bearing physical activities.  Princess Bruins MD, 8:35 AM 08/26/2020

## 2020-08-31 LAB — CYTOLOGY - PAP
Comment: NEGATIVE
Diagnosis: NEGATIVE
High risk HPV: NEGATIVE

## 2020-09-06 ENCOUNTER — Encounter: Payer: Self-pay | Admitting: Acute Care

## 2020-11-06 ENCOUNTER — Other Ambulatory Visit: Payer: Self-pay | Admitting: Family Medicine

## 2020-11-06 DIAGNOSIS — I10 Essential (primary) hypertension: Secondary | ICD-10-CM

## 2020-11-22 ENCOUNTER — Telehealth: Payer: Self-pay | Admitting: *Deleted

## 2020-11-22 NOTE — Telephone Encounter (Addendum)
Deductible n/a  OOP MAX $3600 (1360.32 met)   Annual exam 08/26/2020  Calcium 8.9            Date 05/13/2020  Upcoming dental procedures NO  Prior Authorization needed yes on file valid 05/16/2020-05/16/2021  Pt estimated Cost $530 (may use Prolia card)   APPT 12/16/2020    Coverage Details: 25% one dose, $40 admin fee

## 2020-12-16 ENCOUNTER — Other Ambulatory Visit: Payer: Self-pay

## 2020-12-16 ENCOUNTER — Ambulatory Visit (INDEPENDENT_AMBULATORY_CARE_PROVIDER_SITE_OTHER): Payer: 59 | Admitting: Gynecology

## 2020-12-16 DIAGNOSIS — M810 Age-related osteoporosis without current pathological fracture: Secondary | ICD-10-CM

## 2020-12-16 MED ORDER — DENOSUMAB 60 MG/ML ~~LOC~~ SOSY
60.0000 mg | PREFILLED_SYRINGE | Freq: Once | SUBCUTANEOUS | Status: AC
  Administered 2020-12-16: 60 mg via SUBCUTANEOUS

## 2020-12-27 ENCOUNTER — Other Ambulatory Visit: Payer: Self-pay

## 2020-12-27 ENCOUNTER — Encounter: Payer: Self-pay | Admitting: Family Medicine

## 2020-12-27 ENCOUNTER — Ambulatory Visit: Payer: 59 | Admitting: Family Medicine

## 2020-12-27 VITALS — BP 110/68 | HR 68 | Temp 98.6°F | Resp 18 | Ht 62.5 in | Wt 150.0 lb

## 2020-12-27 DIAGNOSIS — M79605 Pain in left leg: Secondary | ICD-10-CM

## 2020-12-27 MED ORDER — TIZANIDINE HCL 2 MG PO CAPS: 2.0000 mg | ORAL_CAPSULE | Freq: Three times a day (TID) | ORAL | 1 refills | Status: DC | PRN

## 2020-12-27 MED ORDER — PREDNISONE 10 MG PO TABS: ORAL_TABLET | ORAL | 0 refills | Status: DC

## 2020-12-27 NOTE — Progress Notes (Signed)
Subjective:   By signing my name below, I, Rebecca Gordon, attest that this documentation has been prepared under the direction and in the presence of Rebecca Held, DO  12/27/2020      Patient ID: Rebecca Gordon, female    DOB: 1955/12/19, 65 y.o.   MRN: [id number]  Chief Complaint  Patient presents with   Leg Pain    X4-5 days, pt states no falls or knee.     HPI Patient is in today for a office visit.  She complains of constant aching left leg pain for the past 4-5 months. She started getting leg cramps around the the time her symptoms present. The pain starts from her left ankle and radiates up to her left buttock. She applies Bengay every night to manage her symptoms and finds mild relief. She is also taking 2x OTC leg cramp pills every 4 hours and finds relief from her cramps. Her pain worsen when walking. When sitting for long periods and getting up to walk she feels pressure in her ankle that stops her from moving until the pressure resolves on its own. She has not told her orthopedist specialist. She has no pain on palpation.  She continues having right knee pain and reports she had to postpone her knee replacement surgery due to a gum infection. She reports of falling 2x in the past 6 months due to her walking gait changing due to right knee pain.   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   Anxiety    Colon polyps    Depression    Endometrial cancer ([facility])    Grade I   Endometrial polyp    Family history of brain cancer    Family history of prostate cancer    Former smoker 09/23/2016   GERD (gastroesophageal reflux disease)    Nexium   H/O clavicle fracture 2017   Right   High cholesterol    History of bronchitis    History of pneumonia    years ago   Hypertension    LVH (left ventricular hypertrophy) 09/23/2016   Mild, noted on ECHO   Numbness    left  fingers after TIA   OA (osteoarthritis)    "right knee"   Osteoporosis 08/2017   T score -2.7   Panic attacks    Postmenopausal bleeding    Thyroid nodule 2019   Multiple   TIA (transient ischemic attack) 09/2016   Wears contact lenses     Past Surgical History:  Procedure Laterality Date   CLAVICLE SURGERY Right 2016   fracture repair   COLONOSCOPY  2017   multiples   DILATATION & CURETTAGE/HYSTEROSCOPY WITH MYOSURE N/A 05/26/2018   Procedure: DILATATION & CURETTAGE/HYSTEROSCOPY WITH MYOSURE;  Surgeon: Princess Bruins, MD;  Location: [facility];  Service: Gynecology;  Laterality: N/A;   EYE SURGERY Right    "growth on eye removed"   FOOT SURGERY Right 08/2017   benign mass   HEMORRHOID SURGERY N/A 06/24/2018   Procedure: HEMORRHOIDECTOMY;  Surgeon: Michael Boston, MD;  Location: [facility];  Service: General;  Laterality: N/A;   KNEE ARTHROSCOPY Right    Menicus repair   ORIF CLAVICULAR FRACTURE Right 04/28/2015   Procedure: REVISION ORIF RIGHT CLAVICAL FRACTURE, ALLOGRAFT BONE GRAFTING;  Surgeon: Justice Britain, MD;  Location: [facility];  Service:  Orthopedics;  Laterality: Right;   Peridontal Laser Surgery  01/2019   due to infection, x 2   PLACEMENT OF BREAST IMPLANTS Bilateral    ROBOTIC ASSISTED TOTAL HYSTERECTOMY WITH BILATERAL SALPINGO OOPHERECTOMY Bilateral 07/10/2018   Procedure: XI ROBOTIC ASSISTED TOTAL HYSTERECTOMY WITH BILATERAL SALPINGO OOPHORECTOMY;  Surgeon: Everitt Amber, MD;  Location: [facility];  Service: Gynecology;  Laterality: Bilateral;   SENTINEL NODE BIOPSY N/A 07/10/2018   Procedure: SENTINEL NODE BIOPSY, LEFT PELVIC NODE;  Surgeon: Everitt Amber, MD;  Location: [facility];  Service: Gynecology;  Laterality: N/A;   SPHINCTEROTOMY N/A 06/24/2018   Procedure: ANAL SPHINCTEROTOMY, ANORECTAL EXAM UNDER ANESTHESIA;  Surgeon: Michael Boston, MD;  Location: [facility];  Service: General;  Laterality: N/A;   TONSILLECTOMY       Family History  Problem Relation Age of Onset   Heart failure Mother    Hypertension Father    Brain cancer Father 55   Prostate cancer Father 76   Skin cancer Father    Cancer Paternal Uncle        unsure type, possibly colon   Leukemia Cousin 9    Social History   Socioeconomic History   Marital status: Married    Spouse name: Not on file   Number of children: 2   Years of education: College   Highest education level: Not on file  Occupational History   Occupation: Unemployed  Tobacco Use   Smoking status: Former    Packs/day: 1.00    Years: 40.00    Pack years: 40.00    Types: Cigarettes    Quit date: 11/2018    Years since quitting: 2.1   Smokeless tobacco: Never   Tobacco comments:    Previous 1 pack a day  Vaping Use   Vaping Use: Never used  Substance and Sexual Activity   Alcohol use: Not Currently    Comment: daily - 1 bottle of wine--- 09/23/2016, 3-4 glasses--- 10/15/16   Drug use: No   Sexual activity: Yes    Partners: Male    Birth control/protection: Post-menopausal    Comment: 1st intercourse- 36, partners- 57, married- 105 yrs   Other Topics Concern   Not on file  Social History Narrative   Lives at home w/ her husband   Right-handed   Caffeine: 2-3 cups per day   Social Determinants of Health   Financial Resource Strain: Not on file  Food Insecurity: Not on file  Transportation Needs: Not on file  Physical Activity: Not on file  Stress: Not on file  Social Connections: Not on file  Intimate Partner Violence: Not on file    Outpatient Medications Prior to Visit  Medication Sig Dispense Refill   amLODipine (NORVASC) 5 MG tablet TAKE ONE TABLET BY MOUTH DAILY 90 tablet 3   buPROPion (WELLBUTRIN XL) 150 MG 24 hr tablet Take 2 tablets (300 mg total) by mouth daily. 180 tablet 1   cholecalciferol (VITAMIN D) 1000 units tablet Take 1,000 Units by mouth every morning.      desvenlafaxine (PRISTIQ) 50 MG 24 hr tablet Take 1 tablet (50 mg  total) by mouth daily. 90 tablet 1   esomeprazole (NEXIUM) 20 MG capsule Take 20 mg by mouth daily at 12 noon.     hydrochlorothiazide (HYDRODIURIL) 25 MG tablet Take 1 tablet (25 mg total) by mouth daily. 90 tablet 3   losartan (COZAAR) 100 MG tablet TAKE ONE TABLET BY MOUTH DAILY 90 tablet 1   metoprolol succinate (TOPROL-XL)  50 MG 24 hr tablet TAKE ONE TABLET BY MOUTH DAILY WITH OR IMMEDIATELY FOLLOWING A MEAL 90 tablet 2   rosuvastatin (CRESTOR) 40 MG tablet Take 1 tablet (40 mg total) by mouth daily. 90 tablet 1   topiramate (TOPAMAX) 100 MG tablet Take 1 tablet (100 mg total) by mouth at bedtime. 90 tablet 1   No facility-administered medications prior to visit.    Allergies  Allergen Reactions   Augmentin  [Amoxicillin-Pot Clavulanate] Diarrhea and Nausea And Vomiting    Review of Systems  Constitutional:  Negative for fever and malaise/fatigue.  HENT:  Negative for congestion.   Eyes:  Negative for blurred vision.  Respiratory:  Negative for shortness of breath.   Cardiovascular:  Negative for chest pain, palpitations and leg swelling.  Gastrointestinal:  Negative for abdominal pain, blood in stool and nausea.  Genitourinary:  Negative for dysuria and frequency.  Musculoskeletal:  Positive for falls (2x in past 6 months), joint pain (Left knee and left ankle) and myalgias (Left leg and left buttock).  Skin:  Negative for rash.  Neurological:  Negative for dizziness, loss of consciousness and headaches.  Endo/Heme/Allergies:  Negative for environmental allergies.  Psychiatric/Behavioral:  Negative for depression. The patient is not nervous/anxious.       Objective:    Physical Exam Vitals and nursing note reviewed.  Constitutional:      General: She is not in acute distress.    Appearance: Normal appearance. She is not ill-appearing.  HENT:     Head: Normocephalic and atraumatic.     Right Ear: External ear normal.     Left Ear: External ear normal.  Eyes:      Extraocular Movements: Extraocular movements intact.     Pupils: Pupils are equal, round, and reactive to light.  Cardiovascular:     Rate and Rhythm: Normal rate and regular rhythm.     Pulses: Normal pulses.          Posterior tibial pulses are 2+ on the left side.  Musculoskeletal:        General: Tenderness present.     Right lower leg: No edema.     Left lower leg: No edema.     Comments: No pain with left ankle palpation or calf palpation No swelling Pt describes pain as an ache that comes and goes   Skin:    General: Skin is warm and dry.  Neurological:     Mental Status: She is alert and oriented to person, place, and time.  Psychiatric:        Behavior: Behavior normal.        Judgment: Judgment normal.    BP 110/68 (BP Location: Right Arm, Patient Position: Sitting, Cuff Size: Normal)   Pulse 68   Temp 98.6 F (37 C) (Oral)   Resp 18   Ht 5' 2.5" (1.588 m)   Wt 150 lb (68 kg)   SpO2 98%   BMI 27.00 kg/m  Wt Readings from Last 3 Encounters:  12/27/20 150 lb (68 kg)  08/26/20 139 lb (63 kg)  05/06/20 147 lb 6.4 oz (66.9 kg)    Diabetic Foot Exam - Simple   No data filed    Lab Results  Component Value Date   WBC 6.6 05/13/2020   HGB 14.4 05/13/2020   HCT 42.0 05/13/2020   PLT 249.0 05/13/2020   GLUCOSE 105 (H) 05/13/2020   CHOL 118 05/13/2020   TRIG 50.0 05/13/2020   HDL 45.60 05/13/2020  LDLCALC 62 05/13/2020   ALT 23 05/13/2020   AST 14 05/13/2020   NA 141 05/13/2020   K 4.1 05/13/2020   CL 105 05/13/2020   CREATININE 0.59 05/13/2020   BUN 21 05/13/2020   CO2 30 05/13/2020   TSH 1.18 05/13/2020   INR 1.0 06/27/2018   HGBA1C 5.5 09/23/2016    Lab Results  Component Value Date   TSH 1.18 05/13/2020   Lab Results  Component Value Date   WBC 6.6 05/13/2020   HGB 14.4 05/13/2020   HCT 42.0 05/13/2020   MCV 92.6 05/13/2020   PLT 249.0 05/13/2020   Lab Results  Component Value Date   NA 141 05/13/2020   K 4.1 05/13/2020   CO2 30  05/13/2020   GLUCOSE 105 (H) 05/13/2020   BUN 21 05/13/2020   CREATININE 0.59 05/13/2020   BILITOT 0.4 05/13/2020   ALKPHOS 51 05/13/2020   AST 14 05/13/2020   ALT 23 05/13/2020   PROT 5.9 (L) 05/13/2020   ALBUMIN 3.9 05/13/2020   CALCIUM 8.9 05/13/2020   ANIONGAP 7 06/27/2018   GFR 95.21 05/13/2020   Lab Results  Component Value Date   CHOL 118 05/13/2020   Lab Results  Component Value Date   HDL 45.60 05/13/2020   Lab Results  Component Value Date   LDLCALC 62 05/13/2020   Lab Results  Component Value Date   TRIG 50.0 05/13/2020   Lab Results  Component Value Date   CHOLHDL 3 05/13/2020   Lab Results  Component Value Date   HGBA1C 5.5 09/23/2016       Assessment & Plan:   Problem List Items Addressed This Visit   None Visit Diagnoses     Left leg pain    -  Primary   Relevant Medications   tizanidine (ZANAFLEX) 2 MG capsule   predniSONE (DELTASONE) 10 MG tablet        Meds ordered this encounter  Medications   tizanidine (ZANAFLEX) 2 MG capsule    Sig: Take 1 capsule (2 mg total) by mouth 3 (three) times daily as needed for muscle spasms.    Dispense:  30 capsule    Refill:  1   predniSONE (DELTASONE) 10 MG tablet    Sig: TAKE 3 TABLETS PO QD FOR 3 DAYS THEN TAKE 2 TABLETS PO QD FOR 3 DAYS THEN TAKE 1 TABLET PO QD FOR 3 DAYS THEN TAKE 1/2 TAB PO QD FOR 3 DAYS    Dispense:  20 tablet    Refill:  0    I, Rebecca Held, DO, personally preformed the services described in this documentation.  All medical record entries made by the scribe were at my direction and in my presence.  I have reviewed the chart and discharge instructions (if applicable) and agree that the record reflects my personal performance and is accurate and complete. 12/27/2020   I,Rebecca Gordon,acting as a Education administrator for [employer], DO.,have documented all relevant documentation on the behalf of Rebecca Held, DO,as directed by  Rebecca Held, DO while in  the presence of Rebecca Held, DO.   Rebecca Held, DO

## 2020-12-28 DIAGNOSIS — M79605 Pain in left leg: Secondary | ICD-10-CM | POA: Insufficient documentation

## 2020-12-28 NOTE — Assessment & Plan Note (Signed)
pred taper and muscle relaxer If no improvement consider sport med / ortho

## 2021-01-12 ENCOUNTER — Other Ambulatory Visit: Payer: Self-pay | Admitting: Family Medicine

## 2021-01-12 DIAGNOSIS — M79605 Pain in left leg: Secondary | ICD-10-CM

## 2021-02-04 ENCOUNTER — Other Ambulatory Visit: Payer: Self-pay | Admitting: Family Medicine

## 2021-02-04 DIAGNOSIS — I10 Essential (primary) hypertension: Secondary | ICD-10-CM

## 2021-02-13 ENCOUNTER — Telehealth: Payer: Self-pay | Admitting: Family Medicine

## 2021-02-13 DIAGNOSIS — F418 Other specified anxiety disorders: Secondary | ICD-10-CM

## 2021-02-13 MED ORDER — DESVENLAFAXINE SUCCINATE ER 50 MG PO TB24: 50.0000 mg | ORAL_TABLET | Freq: Every day | ORAL | 1 refills | Status: DC

## 2021-02-13 MED ORDER — ROSUVASTATIN CALCIUM 40 MG PO TABS: 40.0000 mg | ORAL_TABLET | Freq: Every day | ORAL | 1 refills | Status: DC

## 2021-02-13 NOTE — Telephone Encounter (Signed)
Medication:  desvenlafaxine (PRISTIQ) 50 MG 24 hr tablet   rosuvastatin (CRESTOR) 40 MG tablet   Has the patient contacted their pharmacy? Yes.   (If no, request that the patient contact the pharmacy for the refill.) (If yes, when and what did the pharmacy advise?) Pt. States pharmacy has contacted [place] multiple times to request a refill.  Preferred Pharmacy (with phone number or street name): [id number] [place], [place] - [address]  [address] [place], [place] [address]  Phone:  [phone]  Fax:  [phone]   Agent: Please be advised that RX refills may take up to 3 business days. We ask that you follow-up with your pharmacy.

## 2021-02-14 ENCOUNTER — Other Ambulatory Visit: Payer: Self-pay

## 2021-02-14 ENCOUNTER — Telehealth: Payer: 59 | Admitting: Family Medicine

## 2021-02-14 ENCOUNTER — Encounter: Payer: Self-pay | Admitting: Family Medicine

## 2021-02-14 VITALS — Ht 63.0 in | Wt 135.0 lb

## 2021-02-14 DIAGNOSIS — M791 Myalgia, unspecified site: Secondary | ICD-10-CM

## 2021-02-14 NOTE — Assessment & Plan Note (Signed)
Check cmp, cpk ? Etiology from statin or diuretic--- seemed to worsen after inc diuretic

## 2021-02-14 NOTE — Progress Notes (Signed)
MyChart Video Visit    Virtual Visit via Video Note   This visit type was conducted due to national recommendations for restrictions regarding the COVID-19 Pandemic (e.g. social distancing) in an effort to limit this patient's exposure and mitigate transmission in our community. This patient is at least at moderate risk for complications without adequate follow up. This format is felt to be most appropriate for this patient at this time. Physical exam was limited by quality of the video and audio technology used for the visit. [name] was able to get the patient set up on a video visit.  Patient location: Home Patient and provider in visit Provider location: Office  I discussed the limitations of evaluation and management by telemedicine and the availability of in person appointments. The patient expressed understanding and agreed to proceed.  Visit Date: 02/14/2021  Today's healthcare provider: [name], DO     Subjective:    Patient ID: Rebecca Gordon, female    DOB: [birth year], 65 y.o.   MRN: [id number]  Chief Complaint  Patient presents with   Leg Pain    Spasms in the leg and goes to the thigh. This is the second time that has happened recently. Has an appt at [facility] 03/03/21. Pain is in both legs not just the right leg.     Leg Pain   Patient is in today for a video visit.   Leg pain- She continues having throbbing leg pain, leg spasms for the past 2 months. She denies having any leg swelling or redness. She reports starting a octavia diet program and found her leg symptoms have improved but soon after her symptoms started developing again. She thought she was dehydrated and started reducing her 25 mg hydrochlorothiazide daily PO to 12.5 mg to manage her symptoms. She found no new issues from reducing the dosage.  She had a TIA episode in 2018. She continues taking aspirin regularly at this time.   BP Readings from Last 3 Encounters:   12/27/20 110/68  08/26/20 126/82  05/06/20 120/70   Pulse Readings from Last 3 Encounters:  12/27/20 68  05/06/20 82  09/11/19 71   Vitamin D- She continues taking 1000 unit vitamin D supplements daily PO every night and reports no new issues while taking it.    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   Anxiety    Colon polyps    Depression    Endometrial cancer ([facility])    Grade I   Endometrial polyp    Family history of brain cancer    Family history of prostate cancer    Former smoker 09/23/2016   GERD (gastroesophageal reflux disease)    Nexium   H/O clavicle fracture 2017   Right   High cholesterol    History of bronchitis    History of pneumonia    years ago   Hypertension    LVH (left ventricular hypertrophy) 09/23/2016   Mild, noted on ECHO   Numbness    left fingers after TIA   OA (osteoarthritis)    "right knee"   Osteoporosis 08/2017   T score -2.7   Panic attacks    Postmenopausal bleeding    Thyroid nodule 2019   Multiple   TIA (transient ischemic attack) 09/2016   Wears contact lenses  Past Surgical History:  Procedure Laterality Date   CLAVICLE SURGERY Right 2016   fracture repair   COLONOSCOPY  2017   multiples   DILATATION & CURETTAGE/HYSTEROSCOPY WITH MYOSURE N/A 05/26/2018   Procedure: DILATATION & CURETTAGE/HYSTEROSCOPY WITH MYOSURE;  Surgeon: Princess Bruins, MD;  Location: [facility];  Service: Gynecology;  Laterality: N/A;   EYE SURGERY Right    "growth on eye removed"   FOOT SURGERY Right 08/2017   benign mass   HEMORRHOID SURGERY N/A 06/24/2018   Procedure: HEMORRHOIDECTOMY;  Surgeon: Michael Boston, MD;  Location: [facility];  Service: General;  Laterality: N/A;   KNEE ARTHROSCOPY Right    Menicus repair   ORIF CLAVICULAR FRACTURE Right 04/28/2015   Procedure: REVISION ORIF RIGHT CLAVICAL  FRACTURE, ALLOGRAFT BONE GRAFTING;  Surgeon: Justice Britain, MD;  Location: [facility];  Service: Orthopedics;  Laterality: Right;   Peridontal Laser Surgery  01/2019   due to infection, x 2   PLACEMENT OF BREAST IMPLANTS Bilateral    ROBOTIC ASSISTED TOTAL HYSTERECTOMY WITH BILATERAL SALPINGO OOPHERECTOMY Bilateral 07/10/2018   Procedure: XI ROBOTIC ASSISTED TOTAL HYSTERECTOMY WITH BILATERAL SALPINGO OOPHORECTOMY;  Surgeon: Everitt Amber, MD;  Location: [facility];  Service: Gynecology;  Laterality: Bilateral;   SENTINEL NODE BIOPSY N/A 07/10/2018   Procedure: SENTINEL NODE BIOPSY, LEFT PELVIC NODE;  Surgeon: Everitt Amber, MD;  Location: [facility];  Service: Gynecology;  Laterality: N/A;   SPHINCTEROTOMY N/A 06/24/2018   Procedure: ANAL SPHINCTEROTOMY, ANORECTAL EXAM UNDER ANESTHESIA;  Surgeon: Michael Boston, MD;  Location: [facility];  Service: General;  Laterality: N/A;   TONSILLECTOMY      Family History  Problem Relation Age of Onset   Heart failure Mother    Hypertension Father    Brain cancer Father 41   Prostate cancer Father 47   Skin cancer Father    Cancer Paternal Uncle        unsure type, possibly colon   Leukemia Cousin 9    Social History   Socioeconomic History   Marital status: Married    Spouse name: Not on file   Number of children: 2   Years of education: College   Highest education level: Not on file  Occupational History   Occupation: Unemployed  Tobacco Use   Smoking status: Former    Packs/day: 1.00    Years: 40.00    Pack years: 40.00    Types: Cigarettes    Quit date: 11/2018    Years since quitting: 2.2   Smokeless tobacco: Never   Tobacco comments:    Previous 1 pack a day  Vaping Use   Vaping Use: Never used  Substance and Sexual Activity   Alcohol use: Not Currently    Comment: daily - 1 bottle of wine--- 09/23/2016, 3-4 glasses--- 10/15/16   Drug use: No   Sexual activity: Yes    Partners: Male    Birth control/protection: Post-menopausal     Comment: 1st intercourse- 7, partners- 58, married- 55 yrs   Other Topics Concern   Not on file  Social History Narrative   Lives at home w/ her husband   Right-handed   Caffeine: 2-3 cups per day   Social Determinants of Health   Financial Resource Strain: Not on file  Food Insecurity: Not on file  Transportation Needs: Not on file  Physical Activity: Not on file  Stress: Not on file  Social Connections: Not on file  Intimate Partner Violence: Not on file  Outpatient Medications Prior to Visit  Medication Sig Dispense Refill   amLODipine (NORVASC) 5 MG tablet TAKE ONE TABLET BY MOUTH DAILY 90 tablet 1   buPROPion (WELLBUTRIN XL) 150 MG 24 hr tablet Take 2 tablets (300 mg total) by mouth daily. 180 tablet 1   cholecalciferol (VITAMIN D) 1000 units tablet Take 1,000 Units by mouth every morning.      desvenlafaxine (PRISTIQ) 50 MG 24 hr tablet Take 1 tablet (50 mg total) by mouth daily. 90 tablet 1   esomeprazole (NEXIUM) 20 MG capsule Take 20 mg by mouth daily at 12 noon.     hydrochlorothiazide (HYDRODIURIL) 25 MG tablet Take 1 tablet (25 mg total) by mouth daily. 90 tablet 3   losartan (COZAAR) 100 MG tablet TAKE ONE TABLET BY MOUTH DAILY 90 tablet 1   metoprolol succinate (TOPROL-XL) 50 MG 24 hr tablet TAKE ONE TABLET BY MOUTH DAILY WITH OR IMMEDIATELY FOLLOWING A MEAL 90 tablet 2   rosuvastatin (CRESTOR) 40 MG tablet Take 1 tablet (40 mg total) by mouth daily. 90 tablet 1   tizanidine (ZANAFLEX) 2 MG capsule Take 1 capsule (2 mg total) by mouth 3 (three) times daily as needed for muscle spasms. 30 capsule 1   predniSONE (DELTASONE) 10 MG tablet TAKE 3 TABLETS PO QD FOR 3 DAYS THEN TAKE 2 TABLETS PO QD FOR 3 DAYS THEN TAKE 1 TABLET PO QD FOR 3 DAYS THEN TAKE 1/2 TAB PO QD FOR 3 DAYS 20 tablet 0   topiramate (TOPAMAX) 100 MG tablet Take 1 tablet (100 mg total) by mouth at bedtime. 90 tablet 1   No facility-administered medications prior to visit.    Allergies  Allergen  Reactions   Augmentin  [Amoxicillin-Pot Clavulanate] Diarrhea and Nausea And Vomiting    Review of Systems  Constitutional:  Negative for fever and malaise/fatigue.  HENT:  Negative for congestion.   Eyes:  Negative for blurred vision.  Respiratory:  Negative for shortness of breath.   Cardiovascular:  Negative for chest pain, palpitations and leg swelling.  Gastrointestinal:  Negative for abdominal pain, blood in stool and nausea.  Genitourinary:  Negative for dysuria and frequency.  Musculoskeletal:  Positive for myalgias (throbbing leg pain). Negative for falls.       (+)leg spasms  Skin:  Negative for rash.  Neurological:  Negative for dizziness, loss of consciousness and headaches.  Endo/Heme/Allergies:  Negative for environmental allergies.  Psychiatric/Behavioral:  Negative for depression. The patient is not nervous/anxious.       Objective:    Physical Exam Vitals and nursing note reviewed.  Constitutional:      General: She is not in acute distress.    Appearance: Normal appearance. She is well-developed. She is not ill-appearing.  Eyes:     Conjunctiva/sclera: Conjunctivae normal.  Neck:     Thyroid: No thyromegaly.     Vascular: No JVD.  Pulmonary:     Effort: Pulmonary effort is normal.  Neurological:     Mental Status: She is alert and oriented to person, place, and time.  Psychiatric:        Mood and Affect: Mood normal.        Behavior: Behavior normal.        Thought Content: Thought content normal.        Judgment: Judgment normal.    Ht 5\' 3"  (1.6 m)   Wt 135 lb (61.2 kg)   BMI 23.91 kg/m  Wt Readings from Last 3 Encounters:  02/14/21 135  lb (61.2 kg)  12/27/20 150 lb (68 kg)  08/26/20 139 lb (63 kg)    Diabetic Foot Exam - Simple   No data filed    Lab Results  Component Value Date   WBC 6.6 05/13/2020   HGB 14.4 05/13/2020   HCT 42.0 05/13/2020   PLT 249.0 05/13/2020   GLUCOSE 105 (H) 05/13/2020   CHOL 118 05/13/2020   TRIG 50.0  05/13/2020   HDL 45.60 05/13/2020   LDLCALC 62 05/13/2020   ALT 23 05/13/2020   AST 14 05/13/2020   NA 141 05/13/2020   K 4.1 05/13/2020   CL 105 05/13/2020   CREATININE 0.59 05/13/2020   BUN 21 05/13/2020   CO2 30 05/13/2020   TSH 1.18 05/13/2020   INR 1.0 06/27/2018   HGBA1C 5.5 09/23/2016    Lab Results  Component Value Date   TSH 1.18 05/13/2020   Lab Results  Component Value Date   WBC 6.6 05/13/2020   HGB 14.4 05/13/2020   HCT 42.0 05/13/2020   MCV 92.6 05/13/2020   PLT 249.0 05/13/2020   Lab Results  Component Value Date   NA 141 05/13/2020   K 4.1 05/13/2020   CO2 30 05/13/2020   GLUCOSE 105 (H) 05/13/2020   BUN 21 05/13/2020   CREATININE 0.59 05/13/2020   BILITOT 0.4 05/13/2020   ALKPHOS 51 05/13/2020   AST 14 05/13/2020   ALT 23 05/13/2020   PROT 5.9 (L) 05/13/2020   ALBUMIN 3.9 05/13/2020   CALCIUM 8.9 05/13/2020   ANIONGAP 7 06/27/2018   GFR 95.21 05/13/2020   Lab Results  Component Value Date   CHOL 118 05/13/2020   Lab Results  Component Value Date   HDL 45.60 05/13/2020   Lab Results  Component Value Date   LDLCALC 62 05/13/2020   Lab Results  Component Value Date   TRIG 50.0 05/13/2020   Lab Results  Component Value Date   CHOLHDL 3 05/13/2020   Lab Results  Component Value Date   HGBA1C 5.5 09/23/2016       Assessment & Plan:   Problem List Items Addressed This Visit       Unprioritized   Myalgia - Primary    Check cmp, cpk ? Etiology from statin or diuretic--- seemed to worsen after inc diuretic        Relevant Orders   Comprehensive metabolic panel   Magnesium   Calcium   VITAMIN D 25 Hydroxy (Vit-D Deficiency, Fractures)   CK     No orders of the defined types were placed in this encounter.   I discussed the assessment and treatment plan with the patient. The patient was provided an opportunity to ask questions and all were answered. The patient agreed with the plan and demonstrated an understanding of  the instructions.   The patient was advised to call back or seek an in-person evaluation if the symptoms worsen or if the condition fails to improve as anticipated.  I,[name],acting as a Education administrator for [employer], DO.,have documented all relevant documentation on the behalf of [name], DO,as directed by  [name], DO while in the presence of [name], DO.  I provided 20 minutes of face-to-face time during this encounter.   [name], DO [place] at [employer] [facility] [phone] (phone) [phone] (fax)  [place]

## 2021-02-15 NOTE — Telephone Encounter (Signed)
PROLIA GIVEN 12/16/2020 NEXT INJECTION 06/15/2020

## 2021-02-20 ENCOUNTER — Other Ambulatory Visit (INDEPENDENT_AMBULATORY_CARE_PROVIDER_SITE_OTHER): Payer: 59

## 2021-02-20 ENCOUNTER — Other Ambulatory Visit: Payer: Self-pay

## 2021-02-20 DIAGNOSIS — M791 Myalgia, unspecified site: Secondary | ICD-10-CM | POA: Diagnosis not present

## 2021-02-20 LAB — COMPREHENSIVE METABOLIC PANEL
ALT: 27 U/L (ref 0–35)
AST: 13 U/L (ref 0–37)
Albumin: 4.2 g/dL (ref 3.5–5.2)
Alkaline Phosphatase: 45 U/L (ref 39–117)
BUN: 25 mg/dL — ABNORMAL HIGH (ref 6–23)
CO2: 31 mEq/L (ref 19–32)
Calcium: 9.1 mg/dL (ref 8.4–10.5)
Chloride: 102 mEq/L (ref 96–112)
Creatinine, Ser: 0.68 mg/dL (ref 0.40–1.20)
GFR: 91.51 mL/min (ref >60.00)
Glucose, Bld: 93 mg/dL (ref 70–99)
Potassium: 4.1 mEq/L (ref 3.5–5.1)
Sodium: 139 mEq/L (ref 135–145)
Total Bilirubin: 0.6 mg/dL (ref 0.2–1.2)
Total Protein: 5.9 g/dL — ABNORMAL LOW (ref 6.0–8.3)

## 2021-02-20 LAB — VITAMIN D 25 HYDROXY (VIT D DEFICIENCY, FRACTURES): VITD: 71.81 ng/mL (ref 30.00–100.00)

## 2021-02-20 LAB — MAGNESIUM: Magnesium: 2 mg/dL (ref 1.5–2.5)

## 2021-02-20 LAB — CALCIUM: Calcium: 9.1 mg/dL (ref 8.4–10.5)

## 2021-02-20 LAB — CK: Total CK: 63 U/L (ref 7–177)

## 2021-02-22 ENCOUNTER — Telehealth: Payer: Self-pay | Admitting: Family Medicine

## 2021-02-22 ENCOUNTER — Telehealth: Payer: Self-pay | Admitting: *Deleted

## 2021-02-22 NOTE — Telephone Encounter (Signed)
Patient called and scheduled a six month follow up. Patient notified the Dr Denman George is not longer with the practice and scheduled an appt with Dr Delsa Sale

## 2021-02-22 NOTE — Telephone Encounter (Signed)
Patient called to go over results, she was made aware that results were back but have not yet been reviewed by the provider.

## 2021-02-22 NOTE — Telephone Encounter (Signed)
Will contact patient with PCP instructions once reviewed by PCP.

## 2021-03-06 ENCOUNTER — Other Ambulatory Visit: Payer: Self-pay

## 2021-03-06 DIAGNOSIS — I10 Essential (primary) hypertension: Secondary | ICD-10-CM

## 2021-03-06 MED ORDER — METOPROLOL SUCCINATE ER 50 MG PO TB24: ORAL_TABLET | ORAL | 0 refills | Status: DC

## 2021-03-07 ENCOUNTER — Telehealth: Payer: Self-pay | Admitting: Family Medicine

## 2021-03-07 NOTE — Telephone Encounter (Signed)
Pt stated leg cramping has gotten better since stopping rosuvastatin (CRESTOR) 40 MG tablet  but was wondering if she could get another medication that is similar. Still can be sent to    [facility] [id number] [place], [place] - [place]  [address] [place], [place] [address]  Phone:  [phone]  Fax:  [phone]

## 2021-03-08 MED ORDER — PITAVASTATIN CALCIUM 2 MG PO TABS: 2.0000 mg | ORAL_TABLET | Freq: Every day | ORAL | 2 refills | Status: DC

## 2021-03-08 NOTE — Telephone Encounter (Signed)
Pt notified. New Rx sent

## 2021-03-13 NOTE — Telephone Encounter (Signed)
PA started in CoverMyMeds. (Key: [id number])

## 2021-03-15 ENCOUNTER — Telehealth: Payer: Self-pay | Admitting: Family Medicine

## 2021-03-15 NOTE — Telephone Encounter (Signed)
Patient medication was denied.  She has only tried rosuvastatin and her insurance wants her to have failed or cannot use the following medications stated below.  Why was my request denied? This request was denied because you did not meet the following clinical requirements: The requested medication is not covered because it is not on the listing or formulary of approved drugs for your plan benefit. Please discuss alternative drug therapy with your doctor. The request for coverage for Livalo 2mg  tablet , use as directed (30 per month), is denied. This decision is based on health plan criteria for Livalo. This medicine is covered only if: You have failed or cannot use four of the following: (1) Atorvastatin (generic Lipitor). (2) Fluvastatin (generic Lescol). (3) Lovastatin (generic Mevacor). (4) Pravastatin (generic Pravachol). (5) Simvastatin (generic Zocor). The information provided does not show that you meet the criteria listed above. Reviewed by: Ubaldo GlassingPh.

## 2021-03-15 NOTE — Telephone Encounter (Signed)
Error

## 2021-03-15 NOTE — Telephone Encounter (Signed)
PA for medication Livalo was denied. Pt stated that insurance stated she needed to try other options before resulting in the medication above.

## 2021-03-17 MED ORDER — PRAVASTATIN SODIUM 20 MG PO TABS: 20.0000 mg | ORAL_TABLET | Freq: Every day | ORAL | 2 refills | Status: DC

## 2021-03-17 NOTE — Telephone Encounter (Signed)
Spoke with patient. Pt states okay to send in a new medication.

## 2021-03-17 NOTE — Telephone Encounter (Signed)
Pt called. LVM to return call to discuss cholesterol medication

## 2021-03-21 ENCOUNTER — Encounter: Payer: Self-pay | Admitting: Obstetrics & Gynecology

## 2021-03-22 ENCOUNTER — Inpatient Hospital Stay: Payer: 59 | Attending: Obstetrics & Gynecology | Admitting: Obstetrics & Gynecology

## 2021-03-22 ENCOUNTER — Other Ambulatory Visit: Payer: Self-pay

## 2021-03-22 VITALS — BP 137/63 | HR 65 | Temp 98.3°F | Resp 18 | Ht 63.0 in | Wt 141.0 lb

## 2021-03-22 DIAGNOSIS — Z8542 Personal history of malignant neoplasm of other parts of uterus: Secondary | ICD-10-CM | POA: Diagnosis not present

## 2021-03-22 DIAGNOSIS — R1907 Generalized intra-abdominal and pelvic swelling, mass and lump: Secondary | ICD-10-CM

## 2021-03-22 DIAGNOSIS — R1900 Intra-abdominal and pelvic swelling, mass and lump, unspecified site: Secondary | ICD-10-CM | POA: Insufficient documentation

## 2021-03-22 DIAGNOSIS — C541 Malignant neoplasm of endometrium: Secondary | ICD-10-CM

## 2021-03-22 DIAGNOSIS — Z01411 Encounter for gynecological examination (general) (routine) with abnormal findings: Secondary | ICD-10-CM

## 2021-03-22 NOTE — Patient Instructions (Signed)
Return in 3 months.

## 2021-03-22 NOTE — Progress Notes (Signed)
Follow Up Note: Gyn-Onc  Rebecca Gordon 65 y.o. female  CC: She presents for a f/u visit   HPI: The oncology history was reviewed.  Interval History: She decided not to pursue genetic testing.  She denies any vaginal bleeding, abdominal/pelvic pain, cough, lethargy or increasing abdominal girth.   Review of Systems  Review of Systems  Constitutional:  Negative for malaise/fatigue and weight loss.  Respiratory:  Negative for shortness of breath and wheezing.   Cardiovascular:  Negative for chest pain and leg swelling.  Gastrointestinal:  Negative for abdominal pain, blood in stool, constipation, nausea and vomiting.  Genitourinary:  Negative for dysuria, frequency, hematuria and urgency.  Musculoskeletal:  Negative for joint pain and myalgias.  Neurological:  Negative for weakness.  Psychiatric/Behavioral:  Negative for depression. The patient does not have insomnia.    Current medications, allergy, social history, past surgical history, past medical history, family history were all reviewed.    Vitals:  BP 137/63 (BP Location: Right Arm, Patient Position: Sitting)   Pulse 65   Temp 98.3 F (36.8 C) (Tympanic)   Resp 18   Ht _0  (1.6 m)   Wt 141 lb (64 kg)   SpO2 99%   BMI 24.98 kg/m     Physical Exam Exam conducted with a chaperone present.  Constitutional:      General: She is not in acute distress. Cardiovascular:     Rate and Rhythm: Normal rate and regular rhythm.  Pulmonary:     Effort: Pulmonary effort is normal.     Breath sounds: Normal breath sounds. No wheezing or rhonchi.  Abdominal:     Palpations: Abdomen is soft.     Tenderness: There is no abdominal tenderness. There is no right CVA tenderness or left CVA tenderness.     Hernia: No hernia is present.  Genitourinary:    General: Normal vulva.     Urethra: No urethral lesion.     Vagina: No lesions. No bleeding  Adnexa: right sided fullness, NT Musculoskeletal:     Cervical back: Neck supple.      Right lower leg: No edema.     Left lower leg: No edema.  Lymphadenopathy:     Upper Body:     Right upper body: No supraclavicular adenopathy.     Left upper body: No supraclavicular adenopathy.     Lower Body: No right inguinal adenopathy. No left inguinal adenopathy.  Skin:    Findings: No rash.  Neurological:     Mental Status: She is oriented to person, place, and time.   Assessment/Plan: Endometrial cancer ([facility]) Rebecca Gordon  is a 65 y.o.  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  Negative symptom review; however there is a palpable abnormality on exam of uncertain clinical significance    >review the CT findings >f/u prn or in a few mos    Lahoma Crocker, MD

## 2021-03-22 NOTE — Assessment & Plan Note (Addendum)
Rebecca Gordon  is a 65 y.o.  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 Negative symptom review; however there is a palpable abnormality on exam of uncertain clinical significance  >review the CT findings >f/u prn or in a few mos

## 2021-03-23 ENCOUNTER — Encounter: Payer: Self-pay | Admitting: Obstetrics & Gynecology

## 2021-03-31 ENCOUNTER — Ambulatory Visit (HOSPITAL_COMMUNITY)
Admission: RE | Admit: 2021-03-31 | Discharge: 2021-03-31 | Disposition: A | Discharge status: Home / Self Care | Payer: 59 | Source: Ambulatory Visit | Attending: Gynecologic Oncology | Admitting: Gynecologic Oncology

## 2021-03-31 ENCOUNTER — Other Ambulatory Visit: Payer: Self-pay

## 2021-03-31 ENCOUNTER — Encounter (HOSPITAL_COMMUNITY): Payer: Self-pay

## 2021-03-31 ENCOUNTER — Other Ambulatory Visit (HOSPITAL_COMMUNITY): Payer: 59

## 2021-03-31 DIAGNOSIS — C541 Malignant neoplasm of endometrium: Secondary | ICD-10-CM | POA: Diagnosis not present

## 2021-03-31 DIAGNOSIS — R1907 Generalized intra-abdominal and pelvic swelling, mass and lump: Secondary | ICD-10-CM | POA: Insufficient documentation

## 2021-03-31 DIAGNOSIS — Z01411 Encounter for gynecological examination (general) (routine) with abnormal findings: Secondary | ICD-10-CM | POA: Insufficient documentation

## 2021-03-31 LAB — POCT I-STAT CREATININE: Creatinine, Ser: 0.6 mg/dL (ref 0.44–1.00)

## 2021-03-31 MED ORDER — SODIUM CHLORIDE (PF) 0.9 % IJ SOLN
INTRAMUSCULAR | Status: AC
  Filled 2021-03-31: qty 50

## 2021-03-31 MED ORDER — IOHEXOL 350 MG/ML SOLN
75.0000 mL | Freq: Once | INTRAVENOUS | Status: AC | PRN
  Administered 2021-03-31: 75 mL via INTRAVENOUS

## 2021-04-03 ENCOUNTER — Telehealth: Payer: Self-pay

## 2021-04-03 ENCOUNTER — Telehealth: Payer: Self-pay | Admitting: Family Medicine

## 2021-04-03 NOTE — Telephone Encounter (Signed)
Spoke with patient to review CT results from 03/31/21. Patient on a conference call and will call back to discuss results when her call is finished.

## 2021-04-03 NOTE — Telephone Encounter (Signed)
Pt states she would like Dr. Etter Sjogren to go over results from ct scan as soon as she can and maybe get an appointment scheduled if need be. Please advise.

## 2021-04-03 NOTE — Telephone Encounter (Signed)
Reviewed CT results with Rebecca Gordon. Per Joylene John, NP the CT scan showed a stable liver cyst, benign cyst in the spleen, mild constipation, atherosclerotic calcifications and arthritis changes of the spine. No evidence of recurrent or metastatic disease. Patient verbalized understanding. Encouraged patient to follow up with PCP, a copy of CT report will be sent to Dr. [name].  Instructed patient to call with any questions or concerns.

## 2021-04-03 NOTE — Telephone Encounter (Signed)
PCP has not reviewed imaging yet

## 2021-04-08 ENCOUNTER — Other Ambulatory Visit: Payer: Self-pay | Admitting: Family Medicine

## 2021-04-08 DIAGNOSIS — I10 Essential (primary) hypertension: Secondary | ICD-10-CM

## 2021-05-07 ENCOUNTER — Other Ambulatory Visit: Payer: Self-pay | Admitting: Family Medicine

## 2021-05-07 DIAGNOSIS — I10 Essential (primary) hypertension: Secondary | ICD-10-CM

## 2021-05-08 MED ORDER — HYDROCHLOROTHIAZIDE 25 MG PO TABS: 25.0000 mg | ORAL_TABLET | Freq: Every day | ORAL | 0 refills | Status: DC

## 2021-05-17 ENCOUNTER — Telehealth: Payer: Self-pay | Admitting: Family Medicine

## 2021-05-17 DIAGNOSIS — F17210 Nicotine dependence, cigarettes, uncomplicated: Secondary | ICD-10-CM

## 2021-05-17 DIAGNOSIS — I10 Essential (primary) hypertension: Secondary | ICD-10-CM

## 2021-05-17 MED ORDER — METOPROLOL SUCCINATE ER 50 MG PO TB24: ORAL_TABLET | ORAL | 2 refills | Status: DC

## 2021-05-17 MED ORDER — BUPROPION HCL ER (XL) 150 MG PO TB24: 300.0000 mg | ORAL_TABLET | Freq: Every day | ORAL | 1 refills | Status: DC

## 2021-05-17 NOTE — Telephone Encounter (Signed)
Pt states pharmacy is having trouble receiving refills from out office.    Medication: metoprolol succinate (TOPROL-XL) 50 MG 24 hr tablet   buPROPion (WELLBUTRIN XL) 150 MG 24 hr tablet   Has the patient contacted their pharmacy? Yes.     Preferred Pharmacy: [id number] [place], [place] - [place]  [address] [place], [place] [address]  Phone:  [phone]  Fax:  [phone]

## 2021-05-17 NOTE — Telephone Encounter (Signed)
Refills sent

## 2021-06-05 ENCOUNTER — Other Ambulatory Visit: Payer: Self-pay | Admitting: Family Medicine

## 2021-06-27 ENCOUNTER — Telehealth: Payer: Self-pay | Admitting: *Deleted

## 2021-06-27 ENCOUNTER — Encounter: Payer: Self-pay | Admitting: Family Medicine

## 2021-06-27 NOTE — Telephone Encounter (Signed)
Returned call to patient. Patient states new insurance as of 04/16/21. Will submit new insurance card through [facility]. RN advised once receives will resubmit benefits to [facility] to run summary of benefits for Prolia. Patient agreeable.  ?

## 2021-06-27 NOTE — Telephone Encounter (Signed)
Message left to return call to [facility] at [phone].  ? ?Message from [place] [place]: "your patient's health plan has advised that your patient's policy terminated on 5-90-93." ?

## 2021-06-28 ENCOUNTER — Encounter: Payer: Self-pay | Admitting: Obstetrics & Gynecology

## 2021-06-30 DIAGNOSIS — Z1231 Encounter for screening mammogram for malignant neoplasm of breast: Secondary | ICD-10-CM | POA: Diagnosis not present

## 2021-07-04 ENCOUNTER — Telehealth: Payer: Self-pay | Admitting: *Deleted

## 2021-07-04 NOTE — Telephone Encounter (Addendum)
Deductible $900 ($0 met) ? ?OOP MAX $3900 ($52.42 met) ? ?Annual exam 08/26/2020 ml ? ?Calcium  9.1           Date 02/20/2021 ? ?Upcoming dental procedures No ? ?Prior Authorization needed NO ? ?Pt estimated Cost $900 (has not met ded) pt will use prolia card ? ? ? ? ? ? ?Coverage Details:0% Prolia cost, 0$ admin fee ? ?

## 2021-07-05 ENCOUNTER — Other Ambulatory Visit: Payer: Self-pay | Admitting: Family Medicine

## 2021-07-05 DIAGNOSIS — I10 Essential (primary) hypertension: Secondary | ICD-10-CM

## 2021-07-05 DIAGNOSIS — F418 Other specified anxiety disorders: Secondary | ICD-10-CM

## 2021-07-05 NOTE — Telephone Encounter (Signed)
Please advise patient is due to have her Prolia injection now. Patient states she will have surgery: knee replacement April 12 th. ? ?Should patient wait on getting her Prolia until after surgery? ? ?I will route to Provider for recommendations. ?

## 2021-07-07 ENCOUNTER — Ambulatory Visit: Payer: BC Managed Care – PPO | Admitting: Family Medicine

## 2021-07-07 ENCOUNTER — Encounter: Payer: Self-pay | Admitting: Family Medicine

## 2021-07-07 VITALS — BP 124/72 | HR 71 | Temp 98.8°F | Resp 18 | Ht 63.0 in | Wt 150.0 lb

## 2021-07-07 DIAGNOSIS — I10 Essential (primary) hypertension: Secondary | ICD-10-CM | POA: Diagnosis not present

## 2021-07-07 DIAGNOSIS — L309 Dermatitis, unspecified: Secondary | ICD-10-CM

## 2021-07-07 DIAGNOSIS — F418 Other specified anxiety disorders: Secondary | ICD-10-CM

## 2021-07-07 DIAGNOSIS — E785 Hyperlipidemia, unspecified: Secondary | ICD-10-CM | POA: Diagnosis not present

## 2021-07-07 DIAGNOSIS — M1711 Unilateral primary osteoarthritis, right knee: Secondary | ICD-10-CM

## 2021-07-07 DIAGNOSIS — M79605 Pain in left leg: Secondary | ICD-10-CM | POA: Diagnosis not present

## 2021-07-07 DIAGNOSIS — J432 Centrilobular emphysema: Secondary | ICD-10-CM

## 2021-07-07 DIAGNOSIS — Z01818 Encounter for other preprocedural examination: Secondary | ICD-10-CM | POA: Diagnosis not present

## 2021-07-07 DIAGNOSIS — F1021 Alcohol dependence, in remission: Secondary | ICD-10-CM

## 2021-07-07 DIAGNOSIS — Z23 Encounter for immunization: Secondary | ICD-10-CM | POA: Diagnosis not present

## 2021-07-07 LAB — LIPID PANEL
Cholesterol: 203 mg/dL — ABNORMAL HIGH (ref 0–200)
HDL: 60.9 mg/dL (ref >39.00)
LDL Cholesterol: 128 mg/dL — ABNORMAL HIGH (ref 0–99)
NonHDL: 141.86
Total CHOL/HDL Ratio: 3
Triglycerides: 68 mg/dL (ref 0.0–149.0)
VLDL: 13.6 mg/dL (ref 0.0–40.0)

## 2021-07-07 LAB — COMPREHENSIVE METABOLIC PANEL
ALT: 16 U/L (ref 0–35)
AST: 11 U/L (ref 0–37)
Albumin: 4.3 g/dL (ref 3.5–5.2)
Alkaline Phosphatase: 53 U/L (ref 39–117)
BUN: 24 mg/dL — ABNORMAL HIGH (ref 6–23)
CO2: 33 mEq/L — ABNORMAL HIGH (ref 19–32)
Calcium: 9.1 mg/dL (ref 8.4–10.5)
Chloride: 101 mEq/L (ref 96–112)
Creatinine, Ser: 0.65 mg/dL (ref 0.40–1.20)
GFR: 92.26 mL/min (ref >60.00)
Glucose, Bld: 96 mg/dL (ref 70–99)
Potassium: 3.9 mEq/L (ref 3.5–5.1)
Sodium: 138 mEq/L (ref 135–145)
Total Bilirubin: 0.5 mg/dL (ref 0.2–1.2)
Total Protein: 5.9 g/dL — ABNORMAL LOW (ref 6.0–8.3)

## 2021-07-07 LAB — CBC WITH DIFFERENTIAL/PLATELET
Basophils Absolute: 0.1 10*3/uL (ref 0.0–0.1)
Basophils Relative: 1.1 % (ref 0.0–3.0)
Eosinophils Absolute: 0.2 10*3/uL (ref 0.0–0.7)
Eosinophils Relative: 3.1 % (ref 0.0–5.0)
HCT: 42.9 % (ref 36.0–46.0)
Hemoglobin: 14.4 g/dL (ref 12.0–15.0)
Lymphocytes Relative: 25 % (ref 12.0–46.0)
Lymphs Abs: 1.7 10*3/uL (ref 0.7–4.0)
MCHC: 33.7 g/dL (ref 30.0–36.0)
MCV: 93.7 fl (ref 78.0–100.0)
Monocytes Absolute: 0.6 10*3/uL (ref 0.1–1.0)
Monocytes Relative: 9.2 % (ref 3.0–12.0)
Neutro Abs: 4.2 10*3/uL (ref 1.4–7.7)
Neutrophils Relative %: 61.6 % (ref 43.0–77.0)
Platelets: 281 10*3/uL (ref 150.0–400.0)
RBC: 4.58 Mil/uL (ref 3.87–5.11)
RDW: 13.4 % (ref 11.5–15.5)
WBC: 6.8 10*3/uL (ref 4.0–10.5)

## 2021-07-07 LAB — PROTIME-INR
INR: 0.9 ratio (ref 0.8–1.0)
Prothrombin Time: 10.5 s (ref 9.6–13.1)

## 2021-07-07 LAB — APTT: aPTT: 26 s (ref 23.4–32.7)

## 2021-07-07 MED ORDER — CICLOPIROX OLAMINE 0.77 % EX SUSP: CUTANEOUS | 2 refills | Status: AC

## 2021-07-07 MED ORDER — TIZANIDINE HCL 2 MG PO CAPS: 2.0000 mg | ORAL_CAPSULE | Freq: Three times a day (TID) | ORAL | 1 refills | Status: DC | PRN

## 2021-07-07 MED ORDER — BUPROPION HCL ER (XL) 150 MG PO TB24: ORAL_TABLET | ORAL | 3 refills | Status: DC

## 2021-07-07 NOTE — Assessment & Plan Note (Signed)
Inc wellbutrin to 450 daily --- will help with depression , appetite and smoking  ?

## 2021-07-07 NOTE — Progress Notes (Addendum)
? ?Subjective:  ? ?By signing my name below, I, [name], attest that this documentation has been prepared under the direction and in the presence of [name], DO. 07/07/2021  ? ? Patient ID: Rebecca Gordon, female    DOB: 1955-05-07, 66 y.o.   MRN: [id number] ? ?Chief Complaint  ?Patient presents with  ? Surgical Clearance  ?  Right knee replacement  ? ? ?HPI ?Patient is in today for a pre-op surgical clearance. ? ?She has an upcoming right knee arthroplasty on 04/12 with Dr. Wynelle Link. Adds the knee pain has been reducing her quality of life and she has trouble walking. ? ?She reports she has gained some weight in the past 3 months. ?Wt Readings from Last 3 Encounters:  ?07/07/21 150 lb (68 kg)  ?03/22/21 141 lb (64 kg)  ?02/14/21 135 lb (61.2 kg)  ?  ?She has quit smoking and has been off for the past 9 months. She has been using 150 mg Wellbutrin to help manage her mood and would like to increase the dosage. ? ?She is complaining of  eczema on the back of her head and in her hair and used to see a dermatologist. She has tried several creams and ointments but they are not very effective.  ? ?EKG reading done today. ? ?Past Medical History:  ?Diagnosis Date  ? Alcoholism ([place])   ? Anal fissure   ? Aneurysm ([place]) 09/2016  ? 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 Anxiety   ? Colon polyps   ? Depression   ? Endometrial cancer ([place])   ? Grade I  ? Endometrial polyp   ? Family history of brain cancer   ? Family history of prostate cancer   ? Former smoker 09/23/2016  ? GERD (gastroesophageal reflux disease)   ? Nexium  ? H/O clavicle fracture 2017  ? Right  ? High cholesterol   ? History of bronchitis   ? History of pneumonia   ? years ago  ? Hypertension   ? LVH (left ventricular hypertrophy) 09/23/2016  ? Mild, noted on ECHO  ? Numbness   ? left fingers after TIA  ? OA (osteoarthritis)   ? "right knee"  ? Osteoporosis 08/2017  ? T score -2.7  ? Panic attacks   ? Postmenopausal bleeding   ? Thyroid  nodule 2019  ? Multiple  ? TIA (transient ischemic attack) 09/2016  ? Wears contact lenses   ? ? ?Past Surgical History:  ?Procedure Laterality Date  ? CLAVICLE SURGERY Right 2016  ? fracture repair  ? COLONOSCOPY  2017  ? multiples  ? DILATATION & CURETTAGE/HYSTEROSCOPY WITH MYOSURE N/A 05/26/2018  ? Procedure: [place];  Surgeon: Princess Bruins, MD;  Location: [facility];  Service: Gynecology;  Laterality: N/A;  ? EYE SURGERY Right   ? "growth on eye removed"  ? FOOT SURGERY Right 08/2017  ? benign mass  ? HEMORRHOID SURGERY N/A 06/24/2018  ? Procedure: HEMORRHOIDECTOMY;  Surgeon: Michael Boston, MD;  Location: [facility];  Service: General;  Laterality: N/A;  ? KNEE ARTHROSCOPY Right   ? Menicus repair  ? ORIF CLAVICULAR FRACTURE Right 04/28/2015  ? Procedure: REVISION ORIF RIGHT CLAVICAL FRACTURE, ALLOGRAFT BONE GRAFTING;  Surgeon: Justice Britain, MD;  Location: [facility];  Service: Orthopedics;  Laterality: Right;  ? [place] Surgery  01/2019  ? due to infection, x 2  ? PLACEMENT OF BREAST IMPLANTS Bilateral   ?  ROBOTIC ASSISTED TOTAL HYSTERECTOMY WITH BILATERAL SALPINGO OOPHERECTOMY Bilateral 07/10/2018  ? Procedure: XI ROBOTIC ASSISTED TOTAL HYSTERECTOMY WITH BILATERAL SALPINGO OOPHORECTOMY;  Surgeon: Everitt Amber, MD;  Location: [facility];  Service: Gynecology;  Laterality: Bilateral;  ? SENTINEL NODE BIOPSY N/A 07/10/2018  ? Procedure: SENTINEL NODE BIOPSY, LEFT PELVIC NODE;  Surgeon: Everitt Amber, MD;  Location: [facility];  Service: Gynecology;  Laterality: N/A;  ? SPHINCTEROTOMY N/A 06/24/2018  ? Procedure: ANAL SPHINCTEROTOMY, ANORECTAL EXAM UNDER ANESTHESIA;  Surgeon: Michael Boston, MD;  Location: [facility];  Service: General;  Laterality: N/A;  ? TONSILLECTOMY    ? ? ?Family History  ?Problem Relation Age of Onset  ? Heart failure Mother   ? Hypertension Father   ? Brain cancer Father 43  ? Prostate cancer Father 28  ? Skin  cancer Father   ? Cancer Paternal Uncle   ?     unsure type, possibly colon  ? Leukemia Cousin 9  ? ? ?Social History  ? ?Socioeconomic History  ? Marital status: Married  ?  Spouse name: Not on file  ? Number of children: 2  ? Years of education: College  ? Highest education level: Not on file  ?Occupational History  ? Occupation: Unemployed  ?Tobacco Use  ? Smoking status: Former  ?  Packs/day: 1.00  ?  Years: 40.00  ?  Pack years: 40.00  ?  Types: Cigarettes  ?  Quit date: 11/2018  ?  Years since quitting: 2.6  ? Smokeless tobacco: Never  ? Tobacco comments:  ?  Previous 1 pack a day  ?Vaping Use  ? Vaping Use: Never used  ?Substance and Sexual Activity  ? Alcohol use: Not Currently  ?  Comment: daily - 1 bottle of wine--- 09/23/2016, 3-4 glasses--- 10/15/16  ? Drug use: No  ? Sexual activity: Yes  ?  Partners: Male  ?  Birth control/protection: Post-menopausal  ?  Comment: 1st intercourse- 35, partners- 77, married- 53 yrs   ?Other Topics Concern  ? Not on file  ?Social History Narrative  ? Lives at home w/ her husband  ? Right-handed  ? Caffeine: 2-3 cups per day  ? ?Social Determinants of Health  ? ?Financial Resource Strain: Not on file  ?Food Insecurity: Not on file  ?Transportation Needs: Not on file  ?Physical Activity: Not on file  ?Stress: Not on file  ?Social Connections: Not on file  ?Intimate Partner Violence: Not on file  ? ? ?Outpatient Medications Prior to Visit  ?Medication Sig Dispense Refill  ? amLODipine (NORVASC) 5 MG tablet TAKE ONE TABLET BY MOUTH DAILY 90 tablet 0  ? aspirin 81 MG EC tablet aspirin 81 mg tablet,delayed release    ? cholecalciferol (VITAMIN D) 1000 units tablet Take 1,000 Units by mouth every morning.     ? desvenlafaxine (PRISTIQ) 50 MG 24 hr tablet TAKE ONE TABLET BY MOUTH DAILY 90 tablet 0  ? esomeprazole (NEXIUM) 20 MG capsule Take 20 mg by mouth daily at 12 noon.    ? hydrochlorothiazide (HYDRODIURIL) 25 MG tablet TAKE ONE TABLET BY MOUTH DAILY 90 tablet 0  ? losartan  (COZAAR) 100 MG tablet TAKE ONE TABLET BY MOUTH DAILY 90 tablet 0  ? Melatonin 5 MG CHEW     ? metoprolol succinate (TOPROL-XL) 50 MG 24 hr tablet TAKE ONE TABLET BY MOUTH DAILY - TAKE WITH OR IMMEDIATELY FOLLOWING A MEAL 30 tablet 2  ? pravastatin (PRAVACHOL) 20 MG tablet TAKE ONE TABLET BY  MOUTH DAILY  *DUE FOR LABS/APPOINTMENT* 30 tablet 0  ? buPROPion (WELLBUTRIN XL) 150 MG 24 hr tablet Take 2 tablets (300 mg total) by mouth daily. 180 tablet 1  ? tizanidine (ZANAFLEX) 2 MG capsule Take 1 capsule (2 mg total) by mouth 3 (three) times daily as needed for muscle spasms. 30 capsule 1  ? Pitavastatin Calcium 2 MG TABS Take 1 tablet (2 mg total) by mouth daily. 30 tablet 2  ? ?No facility-administered medications prior to visit.  ? ? ?Allergies  ?Allergen Reactions  ? Augmentin  [Amoxicillin-Pot Clavulanate] Diarrhea and Nausea And Vomiting  ? ? ?Review of Systems  ?Constitutional:  Negative for fever.  ?HENT:  Negative for congestion, ear pain, hearing loss, sinus pain and sore throat.   ?Eyes:  Negative for blurred vision and pain.  ?Respiratory:  Negative for cough, sputum production, shortness of breath and wheezing.   ?Cardiovascular:  Negative for chest pain and palpitations.  ?Gastrointestinal:  Negative for blood in stool, constipation, diarrhea, nausea and vomiting.  ?Genitourinary:  Negative for dysuria, frequency, hematuria and urgency.  ?Musculoskeletal:  Positive for joint pain. Negative for back pain, falls and myalgias.  ?Skin:   ?     (+) eczema   ?Neurological:  Negative for dizziness, sensory change, loss of consciousness, weakness and headaches.  ?Endo/Heme/Allergies:  Negative for environmental allergies. Does not bruise/bleed easily.  ?Psychiatric/Behavioral:  Negative for depression and suicidal ideas. The patient is not nervous/anxious and does not have insomnia.   ? ?   ?Objective:  ?  ?Physical Exam ?Vitals and nursing note reviewed.  ?Constitutional:   ?   General: She is not in acute  distress. ?   Appearance: Normal appearance. She is not ill-appearing.  ?HENT:  ?   Head: Normocephalic and atraumatic.  ?   Right Ear: External ear normal.  ?   Left Ear: External ear normal.  ?Eyes:  ?   Extr

## 2021-07-07 NOTE — Patient Instructions (Signed)
Total Knee Replacement ?Total knee replacement is a procedure to replace the damaged knee joint with an artificial (prosthetic) knee joint. The purpose of this surgery is to reduce knee pain and improve knee function. ?The prosthetic knee joint (prosthesis) may be made of metal, plastic, or ceramic. It replaces parts of the thigh bone (femur), lower leg bone (tibia), and kneecap (patella) that are removed during the procedure. ?Tell a health care provider about: ?Any allergies you have. ?All medicines you are taking, including vitamins, herbs, eye drops, creams, and over-the-counter medicines. ?Any problems you or family members have had with anesthetic medicines. ?Any blood disorders you have. ?Any surgeries you have had. ?Any medical conditions you have. ?Whether you are pregnant or may be pregnant. ?What are the risks? ?Generally, this is a safe procedure. However, problems may occur, including: ?Infection. ?Bleeding. ?A blood clot that forms in your leg. The clot may break loose and travel to your lungs (pulmonary embolism). ?Allergic reactions to medicines. ?Damage to nerves or other structures. ?Problems with the knee, such as: ?Decreased range of motion of the knee. ?Instability of the knee. ?Loosening of the prosthetic joint. ?Knee pain that does not go away. ?What happens before the procedure? ?Staying hydrated ?Follow instructions from your health care provider about hydration, which may include: ?Up to 2 hours before the procedure - you may continue to drink clear liquids, such as water, clear fruit juice, black coffee, and plain tea. ? ?Eating and drinking restrictions ?Follow instructions from your health care provider about eating and drinking, which may include: ?8 hours before the procedure - stop eating heavy meals or foods, such as meat, fried foods, or fatty foods. ?6 hours before the procedure - stop eating light meals or foods, such as toast or cereal. ?6 hours before the procedure - stop  drinking milk or drinks that contain milk. ?2 hours before the procedure - stop drinking clear liquids. ?Medicines ?Ask your health care provider about: ?Changing or stopping your regular medicines. This is especially important if you are taking diabetes medicines or blood thinners. ?Taking medicines such as aspirin and ibuprofen. These medicines can thin your blood. Do not take these medicines unless your health care provider tells you to take them. ?Taking over-the-counter medicines, vitamins, herbs, and supplements. ?Tests and exams ?You may have: ?A physical exam. ?Tests, such as X-rays, MRI, CT scan, or bone scan. ?A blood or urine sample taken. ?Lifestyle ? ?Keep your body and teeth clean. Germs from anywhere in your body can travel to your new joint and infect it. Tell your health care provider if you: ?Plan to have dental care and routine cleanings. ?Develop any skin infections. ?If your health care provider prescribes physical therapy, do exercises as instructed. ?If you are overweight, work with your health care provider to reach a safe weight. Extra weight can put pressure on your knee. ?Do not use any products that contain nicotine or tobacco for at least 4 weeks before the procedure. These products include cigarettes, chewing tobacco, and vaping devices, such as e-cigarettes. If you need help quitting, ask your health care provider. ?Surgery safety ?Ask your health care provider: ?How your surgery site will be marked. ?What steps will be taken to help prevent infection. These steps may include: ?Removing hair at the surgery site. ?Washing skin with a germ-killing soap. ?Taking antibiotic medicine. ?General instructions ?Do not shave your legs just before surgery. If hair removal is needed, it will be done in the hospital. ?Plan to have a  responsible adult take you home from the hospital or clinic. ?Plan to have a responsible adult care for you for the time you are told after you leave the hospital or  clinic. This is important. ?It is recommended that you have someone to help care for you for at least 4-6 weeks after your procedure. ?What happens during the procedure? ?An IV will be inserted into one of your veins. ?You will be given one or more of the following: ?A medicine to help you relax (sedative). ?A medicine that is injected into an area of your body near the nerves to numb everything below the injection site (peripheral nerve block). ?A medicine that is injected into your spine to numb the area below and slightly above the injection site (spinal anesthetic). ?A medicine to make you fall asleep (general anesthetic). ?An incision will be made in your knee. ?Damaged cartilage and bone will be removed from your femur, tibia, and patella. ?Parts of the prosthesis (liners) will be placed over the areas of bone and cartilage that were removed. A metal liner will be placed over your femur, and plastic liners will be placed over your tibia and the underside of your patella. ?Your incision will be closed with stitches (sutures), staples, skin glue, or adhesive strips. ?A bandage (dressing) will be placed over your incision. ?The procedure may vary among health care providers and hospitals. ?What happens after the procedure? ?Your blood pressure, heart rate, breathing rate, and blood oxygen level will be monitored until you leave the hospital or clinic. ?You will be given medicines to help manage pain. ?You may: ?Continue to receive fluids through an IV. ?Have to wear compression stockings. These stockings help to prevent blood clots and reduce swelling in your legs. ?You will be encouraged to move. A physical therapist will teach you how to use crutches or a walker and how to exercise at home. ?Do not drive until your health care provider approves. ?Summary ?Total knee replacement is a procedure to replace the knee joint with an artificial knee joint. ?Before the procedure, follow instructions from your health  care provider about eating and drinking. ?Plan to have a responsible adult take you home from the hospital or clinic. ?This information is not intended to replace advice given to you by your health care provider. Make sure you discuss any questions you have with your health care provider. ?Document Revised: 09/22/2019 Document Reviewed: 09/22/2019 ?Elsevier Patient Education ? [place]. ? ?

## 2021-07-07 NOTE — Assessment & Plan Note (Signed)
Well controlled, no changes to meds. Encouraged heart healthy diet such as the DASH diet and exercise as tolerated.  °

## 2021-07-07 NOTE — Assessment & Plan Note (Signed)
Pt quit smoking  ?

## 2021-07-07 NOTE — Assessment & Plan Note (Signed)
Pt is still not drinking ?

## 2021-07-07 NOTE — Assessment & Plan Note (Addendum)
Pt cleared for Total R knee replacement ? ?Ekg-- sinus rhythm ?

## 2021-07-07 NOTE — Assessment & Plan Note (Signed)
Encourage heart healthy diet such as MIND or DASH diet, increase exercise, avoid trans fats, simple carbohydrates and processed foods, consider a krill or fish or flaxseed oil cap daily.  °

## 2021-07-07 NOTE — Assessment & Plan Note (Signed)
Pt cleared for knee replacement with Dr Wynelle Link  ?

## 2021-07-12 ENCOUNTER — Other Ambulatory Visit: Payer: Self-pay

## 2021-07-12 MED ORDER — PRAVASTATIN SODIUM 40 MG PO TABS: 40.0000 mg | ORAL_TABLET | Freq: Every day | ORAL | 1 refills | Status: DC

## 2021-07-12 NOTE — Telephone Encounter (Signed)
Princess Bruins, MD sent to [facility], [place] ?Caller: Unspecified (1 week ago) ?Yes, Prolia when healed from surgery, at 6-8 weeks if no complication.  ?

## 2021-07-20 NOTE — Telephone Encounter (Signed)
Surgery April 14 th ?I will call her back June 8th to follow up and possibly schedule Prolia injection appt ? ?

## 2021-07-26 ENCOUNTER — Other Ambulatory Visit: Payer: Self-pay | Admitting: Family Medicine

## 2021-07-26 ENCOUNTER — Encounter: Payer: Self-pay | Admitting: Family Medicine

## 2021-07-26 ENCOUNTER — Telehealth: Payer: Self-pay | Admitting: Family Medicine

## 2021-07-26 DIAGNOSIS — I671 Cerebral aneurysm, nonruptured: Secondary | ICD-10-CM

## 2021-07-26 NOTE — Telephone Encounter (Signed)
Pt went to have surgery on right knee today at the surgical center of [place]. Pt was getting ready for surgery, when she was advised that she had a brain aneurism 5 years ago. Anesthesiologist stopped they surgery after reviewing medical history. Pt is needing a MRI done before surgery can be completed although surgical clearance was approved. Please advise.  ?

## 2021-07-27 NOTE — Telephone Encounter (Signed)
Imaging scheduled for Saturday ?

## 2021-07-28 ENCOUNTER — Encounter: Payer: Self-pay | Admitting: Family Medicine

## 2021-07-28 ENCOUNTER — Ambulatory Visit: Payer: BC Managed Care – PPO | Admitting: Physical Therapy

## 2021-07-28 ENCOUNTER — Telehealth (INDEPENDENT_AMBULATORY_CARE_PROVIDER_SITE_OTHER): Payer: BC Managed Care – PPO | Admitting: Family Medicine

## 2021-07-28 DIAGNOSIS — I671 Cerebral aneurysm, nonruptured: Secondary | ICD-10-CM | POA: Insufficient documentation

## 2021-07-28 NOTE — Progress Notes (Addendum)
MyChart Video Visit    Virtual Visit via Video Note   This visit type was conducted due to national recommendations for restrictions regarding the COVID-19 Pandemic (e.g. social distancing) in an effort to limit this patient's exposure and mitigate transmission in our community. This patient is at least at moderate risk for complications without adequate follow up. This format is felt to be most appropriate for this patient at this time. Physical exam was limited by quality of the video and audio technology used for the visit. Rebecca Gordon. was able to get the patient set up on a video visit.   Patient location: Home Patient and provider in visit Provider location: Office  I discussed the limitations of evaluation and management by telemedicine and the availability of in person appointments. The patient expressed understanding and agreed to proceed.  Visit Date: 07/28/2021  Today's healthcare provider: Donato Schultz, DO   Subjective:    Patient ID: Rebecca Gordon, female    DOB: [birth year], 66 y.o.   MRN: [id number]  Chief Complaint  Patient presents with   Pre-Op questions    HPI Patient is in today for a video visit.   We had to change to a telephone visit due to difficulties with the sound.   Her total knee arthroplasty was cancelled due to her anesthesiologist being concerned about the presence of a brain aneurysm. MRI has been ordered.   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   Anxiety    Colon polyps    Depression    Endometrial cancer (HCC)    Grade I   Endometrial polyp    Family history of brain cancer    Family history of prostate cancer    Former smoker 09/23/2016   GERD (gastroesophageal reflux disease)    Nexium   H/O clavicle fracture 2017   Right   High cholesterol    History of bronchitis    History of pneumonia    years ago    Hypertension    LVH (left ventricular hypertrophy) 09/23/2016   Mild, noted on ECHO   Numbness    left fingers after TIA   OA (osteoarthritis)    "right knee"   Osteoporosis 08/2017   T score -2.7   Panic attacks    Postmenopausal bleeding    Thyroid nodule 2019   Multiple   TIA (transient ischemic attack) 09/2016   Wears contact lenses     Past Surgical History:  Procedure Laterality Date   CLAVICLE SURGERY Right 2016   fracture repair   COLONOSCOPY  2017   multiples   DILATATION & CURETTAGE/HYSTEROSCOPY WITH MYOSURE N/A 05/26/2018   Procedure: DILATATION & CURETTAGE/HYSTEROSCOPY WITH MYOSURE;  Surgeon: Genia Del, MD;  Location: [facility];  Service: Gynecology;  Laterality: N/A;   EYE SURGERY Right    "growth on eye removed"   FOOT SURGERY Right 08/2017   benign mass   HEMORRHOID SURGERY N/A 06/24/2018   Procedure: HEMORRHOIDECTOMY;  Surgeon: Karie Soda, MD;  Location: [facility];  Service: General;  Laterality: N/A;   KNEE ARTHROSCOPY Right    Menicus repair   ORIF CLAVICULAR FRACTURE Right 04/28/2015   Procedure: REVISION ORIF RIGHT CLAVICAL FRACTURE, ALLOGRAFT BONE GRAFTING;  Surgeon: Francena Hanly, MD;  Location: [facility];  Service: Orthopedics;  Laterality: Right;   Peridontal  Laser Surgery  01/2019   due to infection, x 2   PLACEMENT OF BREAST IMPLANTS Bilateral    ROBOTIC ASSISTED TOTAL HYSTERECTOMY WITH BILATERAL SALPINGO OOPHERECTOMY Bilateral 07/10/2018   Procedure: XI ROBOTIC ASSISTED TOTAL HYSTERECTOMY WITH BILATERAL SALPINGO OOPHORECTOMY;  Surgeon: Adolphus Birchwood, MD;  Location: [facility];  Service: Gynecology;  Laterality: Bilateral;   SENTINEL NODE BIOPSY N/A 07/10/2018   Procedure: SENTINEL NODE BIOPSY, LEFT PELVIC NODE;  Surgeon: Adolphus Birchwood, MD;  Location: [facility];  Service: Gynecology;  Laterality: N/A;   SPHINCTEROTOMY N/A 06/24/2018   Procedure: ANAL SPHINCTEROTOMY, ANORECTAL EXAM UNDER ANESTHESIA;  Surgeon: Karie Soda,  MD;  Location: [facility];  Service: General;  Laterality: N/A;   TONSILLECTOMY      Family History  Problem Relation Age of Onset   Heart failure Mother    Hypertension Father    Brain cancer Father 68   Prostate cancer Father 59   Skin cancer Father    Cancer Paternal Uncle        unsure type, possibly colon   Leukemia Cousin 9    Social History   Socioeconomic History   Marital status: Married    Spouse name: Not on file   Number of children: 2   Years of education: College   Highest education level: Not on file  Occupational History   Occupation: Unemployed  Tobacco Use   Smoking status: Former    Packs/day: 1.00    Years: 40.00    Pack years: 40.00    Types: Cigarettes    Quit date: 11/2018    Years since quitting: 2.7   Smokeless tobacco: Never   Tobacco comments:    Previous 1 pack a day  Vaping Use   Vaping Use: Never used  Substance and Sexual Activity   Alcohol use: Not Currently    Comment: daily - 1 bottle of wine--- 09/23/2016, 3-4 glasses--- 10/15/16   Drug use: No   Sexual activity: Yes    Partners: Male    Birth control/protection: Post-menopausal    Comment: 1st intercourse- 16, partners- 15, married- 30 yrs   Other Topics Concern   Not on file  Social History Narrative   Lives at home w/ her husband   Right-handed   Caffeine: 2-3 cups per day   Social Determinants of Health   Financial Resource Strain: Not on file  Food Insecurity: Not on file  Transportation Needs: Not on file  Physical Activity: Not on file  Stress: Not on file  Social Connections: Not on file  Intimate Partner Violence: Not on file    Outpatient Medications Prior to Visit  Medication Sig Dispense Refill   amLODipine (NORVASC) 5 MG tablet TAKE ONE TABLET BY MOUTH DAILY 90 tablet 0   aspirin 81 MG EC tablet aspirin 81 mg tablet,delayed release     buPROPion (WELLBUTRIN XL) 150 MG 24 hr tablet 3 po qd 270 tablet 3   cholecalciferol (VITAMIN D) 1000  units tablet Take 1,000 Units by mouth every morning.      ciclopirox (LOPROX) 0.77 % SUSP Use on affected area bid 60 mL 2   desvenlafaxine (PRISTIQ) 50 MG 24 hr tablet TAKE ONE TABLET BY MOUTH DAILY 90 tablet 0   esomeprazole (NEXIUM) 20 MG capsule Take 20 mg by mouth daily at 12 noon.     hydrochlorothiazide (HYDRODIURIL) 25 MG tablet TAKE ONE TABLET BY MOUTH DAILY 90 tablet 0   losartan (COZAAR) 100 MG tablet TAKE ONE TABLET BY  MOUTH DAILY 90 tablet 0   Melatonin 5 MG CHEW      metoprolol succinate (TOPROL-XL) 50 MG 24 hr tablet TAKE ONE TABLET BY MOUTH DAILY - TAKE WITH OR IMMEDIATELY FOLLOWING A MEAL 30 tablet 2   pravastatin (PRAVACHOL) 40 MG tablet Take 1 tablet (40 mg total) by mouth daily. 90 tablet 1   tizanidine (ZANAFLEX) 2 MG capsule Take 1 capsule (2 mg total) by mouth 3 (three) times daily as needed for muscle spasms. 30 capsule 1   No facility-administered medications prior to visit.    Allergies  Allergen Reactions   Augmentin  [Amoxicillin-Pot Clavulanate] Diarrhea and Nausea And Vomiting    Review of Systems  Constitutional:  Negative for fever.  HENT:  Negative for congestion.   Respiratory:  Negative for cough.   Cardiovascular:  Negative for chest pain.  Gastrointestinal:  Negative for nausea.  Genitourinary:  Negative for dysuria.  Musculoskeletal:  Negative for myalgias.  Skin:  Negative for rash.  Neurological:  Negative for dizziness.  Psychiatric/Behavioral:  Negative for depression.       Objective:    Physical Exam Constitutional:      Appearance: Normal appearance. She is not ill-appearing or toxic-appearing.  Neurological:     Mental Status: She is alert and oriented to person, place, and time.  Psychiatric:        Behavior: Behavior normal.        Thought Content: Thought content normal.    There were no vitals taken for this visit. Wt Readings from Last 3 Encounters:  07/07/21 150 lb (68 kg)  03/22/21 141 lb (64 kg)  02/14/21 135 lb  (61.2 kg)    Diabetic Foot Exam - Simple   No data filed    Lab Results  Component Value Date   WBC 6.8 07/07/2021   HGB 14.4 07/07/2021   HCT 42.9 07/07/2021   PLT 281.0 07/07/2021   GLUCOSE 96 07/07/2021   CHOL 203 (H) 07/07/2021   TRIG 68.0 07/07/2021   HDL 60.90 07/07/2021   LDLCALC 128 (H) 07/07/2021   ALT 16 07/07/2021   AST 11 07/07/2021   NA 138 07/07/2021   K 3.9 07/07/2021   CL 101 07/07/2021   CREATININE 0.65 07/07/2021   BUN 24 (H) 07/07/2021   CO2 33 (H) 07/07/2021   TSH 1.18 05/13/2020   INR 0.9 07/07/2021   HGBA1C 5.5 09/23/2016    Lab Results  Component Value Date   TSH 1.18 05/13/2020   Lab Results  Component Value Date   WBC 6.8 07/07/2021   HGB 14.4 07/07/2021   HCT 42.9 07/07/2021   MCV 93.7 07/07/2021   PLT 281.0 07/07/2021   Lab Results  Component Value Date   NA 138 07/07/2021   K 3.9 07/07/2021   CO2 33 (H) 07/07/2021   GLUCOSE 96 07/07/2021   BUN 24 (H) 07/07/2021   CREATININE 0.65 07/07/2021   BILITOT 0.5 07/07/2021   ALKPHOS 53 07/07/2021   AST 11 07/07/2021   ALT 16 07/07/2021   PROT 5.9 (L) 07/07/2021   ALBUMIN 4.3 07/07/2021   CALCIUM 9.1 07/07/2021   ANIONGAP 7 06/27/2018   GFR 92.26 07/07/2021   Lab Results  Component Value Date   CHOL 203 (H) 07/07/2021   Lab Results  Component Value Date   HDL 60.90 07/07/2021   Lab Results  Component Value Date   LDLCALC 128 (H) 07/07/2021   Lab Results  Component Value Date   TRIG 68.0 07/07/2021  Lab Results  Component Value Date   CHOLHDL 3 07/07/2021   Lab Results  Component Value Date   HGBA1C 5.5 09/23/2016       Assessment & Plan:   Problem List Items Addressed This Visit       Unprioritized   Brain aneurysm - Primary    Mri to be done tomorrow  If it has changed may need neuro consult  Pt is asymptomatic           No orders of the defined types were placed in this encounter.   I discussed the assessment and treatment plan with the  patient. The patient was provided an opportunity to ask questions and all were answered. The patient agreed with the plan and demonstrated an understanding of the instructions.   The patient was advised to call back or seek an in-person evaluation if the symptoms worsen or if the condition fails to improve as anticipated.  I provided 20 minutes of face-to-face time during this encounter.   I,[name],acting as a Neurosurgeon for [employer], DO.,have documented all relevant documentation on the behalf of Donato Schultz, DO,as directed by  Donato Schultz, DO while in the presence of Donato Schultz, DO.   I, Donato Schultz, DO, have reviewed all documentation for this visit. The documentation on 07/28/21 for the exam, diagnosis, procedures, and orders are all accurate and complete.   Donato Schultz, DO [facility] [phone] (phone) [phone] (fax)  [facility]

## 2021-07-28 NOTE — Assessment & Plan Note (Signed)
Mri to be done tomorrow  ?If it has changed may need neuro consult  ?Pt is asymptomatic  ?

## 2021-07-29 ENCOUNTER — Ambulatory Visit (HOSPITAL_BASED_OUTPATIENT_CLINIC_OR_DEPARTMENT_OTHER)
Admission: RE | Admit: 2021-07-29 | Discharge: 2021-07-29 | Disposition: A | Discharge status: Home / Self Care | Payer: BC Managed Care – PPO | Source: Ambulatory Visit | Attending: Family Medicine | Admitting: Family Medicine

## 2021-07-29 ENCOUNTER — Other Ambulatory Visit: Payer: Self-pay

## 2021-07-29 ENCOUNTER — Other Ambulatory Visit: Payer: Self-pay | Admitting: Family Medicine

## 2021-07-29 DIAGNOSIS — I671 Cerebral aneurysm, nonruptured: Secondary | ICD-10-CM

## 2021-07-30 ENCOUNTER — Encounter: Payer: Self-pay | Admitting: Family Medicine

## 2021-07-31 ENCOUNTER — Ambulatory Visit: Payer: BC Managed Care – PPO | Admitting: Physical Therapy

## 2021-07-31 DIAGNOSIS — I671 Cerebral aneurysm, nonruptured: Secondary | ICD-10-CM | POA: Diagnosis not present

## 2021-08-02 ENCOUNTER — Ambulatory Visit: Payer: BC Managed Care – PPO | Admitting: Physical Therapy

## 2021-08-04 ENCOUNTER — Ambulatory Visit: Payer: BC Managed Care – PPO | Admitting: Physical Therapy

## 2021-08-07 ENCOUNTER — Ambulatory Visit: Payer: BC Managed Care – PPO | Admitting: Physical Therapy

## 2021-08-07 ENCOUNTER — Other Ambulatory Visit: Payer: Self-pay | Admitting: Family Medicine

## 2021-08-07 DIAGNOSIS — I10 Essential (primary) hypertension: Secondary | ICD-10-CM

## 2021-08-09 ENCOUNTER — Ambulatory Visit: Payer: BC Managed Care – PPO | Admitting: Physical Therapy

## 2021-08-09 DIAGNOSIS — E785 Hyperlipidemia, unspecified: Secondary | ICD-10-CM | POA: Diagnosis not present

## 2021-08-09 DIAGNOSIS — Z8673 Personal history of transient ischemic attack (TIA), and cerebral infarction without residual deficits: Secondary | ICD-10-CM | POA: Diagnosis not present

## 2021-08-09 DIAGNOSIS — K219 Gastro-esophageal reflux disease without esophagitis: Secondary | ICD-10-CM | POA: Diagnosis not present

## 2021-08-09 DIAGNOSIS — I10 Essential (primary) hypertension: Secondary | ICD-10-CM | POA: Diagnosis not present

## 2021-08-09 DIAGNOSIS — I671 Cerebral aneurysm, nonruptured: Secondary | ICD-10-CM | POA: Diagnosis not present

## 2021-08-10 DIAGNOSIS — I671 Cerebral aneurysm, nonruptured: Secondary | ICD-10-CM | POA: Diagnosis not present

## 2021-08-11 ENCOUNTER — Ambulatory Visit: Payer: BC Managed Care – PPO | Admitting: Physical Therapy

## 2021-08-21 DIAGNOSIS — I671 Cerebral aneurysm, nonruptured: Secondary | ICD-10-CM | POA: Diagnosis not present

## 2021-08-24 DIAGNOSIS — I671 Cerebral aneurysm, nonruptured: Secondary | ICD-10-CM | POA: Diagnosis not present

## 2021-09-01 ENCOUNTER — Ambulatory Visit: Payer: 59 | Admitting: Obstetrics & Gynecology

## 2021-09-05 DIAGNOSIS — F1021 Alcohol dependence, in remission: Secondary | ICD-10-CM | POA: Diagnosis not present

## 2021-09-05 DIAGNOSIS — Z90710 Acquired absence of both cervix and uterus: Secondary | ICD-10-CM | POA: Diagnosis not present

## 2021-09-05 DIAGNOSIS — Z8542 Personal history of malignant neoplasm of other parts of uterus: Secondary | ICD-10-CM | POA: Diagnosis not present

## 2021-09-05 DIAGNOSIS — K219 Gastro-esophageal reflux disease without esophagitis: Secondary | ICD-10-CM | POA: Diagnosis not present

## 2021-09-05 DIAGNOSIS — I6011 Nontraumatic subarachnoid hemorrhage from right middle cerebral artery: Secondary | ICD-10-CM | POA: Diagnosis not present

## 2021-09-05 DIAGNOSIS — Z8616 Personal history of COVID-19: Secondary | ICD-10-CM | POA: Diagnosis not present

## 2021-09-05 DIAGNOSIS — Z87891 Personal history of nicotine dependence: Secondary | ICD-10-CM | POA: Diagnosis not present

## 2021-09-05 DIAGNOSIS — I10 Essential (primary) hypertension: Secondary | ICD-10-CM | POA: Diagnosis not present

## 2021-09-05 DIAGNOSIS — F32A Depression, unspecified: Secondary | ICD-10-CM | POA: Diagnosis not present

## 2021-09-05 DIAGNOSIS — I671 Cerebral aneurysm, nonruptured: Secondary | ICD-10-CM | POA: Diagnosis not present

## 2021-09-05 DIAGNOSIS — M1711 Unilateral primary osteoarthritis, right knee: Secondary | ICD-10-CM | POA: Diagnosis not present

## 2021-09-05 DIAGNOSIS — Z79899 Other long term (current) drug therapy: Secondary | ICD-10-CM | POA: Diagnosis not present

## 2021-09-05 DIAGNOSIS — Z8673 Personal history of transient ischemic attack (TIA), and cerebral infarction without residual deficits: Secondary | ICD-10-CM | POA: Diagnosis not present

## 2021-09-05 DIAGNOSIS — I361 Nonrheumatic tricuspid (valve) insufficiency: Secondary | ICD-10-CM | POA: Diagnosis not present

## 2021-09-05 DIAGNOSIS — Z880 Allergy status to penicillin: Secondary | ICD-10-CM | POA: Diagnosis not present

## 2021-09-05 DIAGNOSIS — E785 Hyperlipidemia, unspecified: Secondary | ICD-10-CM | POA: Diagnosis not present

## 2021-09-05 DIAGNOSIS — F419 Anxiety disorder, unspecified: Secondary | ICD-10-CM | POA: Diagnosis not present

## 2021-09-05 DIAGNOSIS — Z9889 Other specified postprocedural states: Secondary | ICD-10-CM | POA: Diagnosis not present

## 2021-09-05 HISTORY — PX: CRANIOTOMY FOR ANEURYSM / VERTEBROBASILAR / CAROTID CIRCULATION: SUR331

## 2021-09-29 NOTE — Telephone Encounter (Signed)
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 What is your recommendations? Prolia was post pone due to previous knee replacement surgery in April.  Patient understands to have to wait before proceeding with Prolia due to surgery but would like recommendations from MD

## 2021-09-29 NOTE — Telephone Encounter (Signed)
Sent a request for summary of benefits to be ran again once proceeded I will give patient an call. Patient aware

## 2021-10-27 ENCOUNTER — Ambulatory Visit (INDEPENDENT_AMBULATORY_CARE_PROVIDER_SITE_OTHER): Payer: BC Managed Care – PPO | Admitting: Obstetrics & Gynecology

## 2021-10-27 ENCOUNTER — Other Ambulatory Visit (HOSPITAL_COMMUNITY)
Admission: RE | Admit: 2021-10-27 | Discharge: 2021-10-27 | Disposition: A | Discharge status: Home / Self Care | Payer: BC Managed Care – PPO | Source: Ambulatory Visit | Attending: Obstetrics & Gynecology | Admitting: Obstetrics & Gynecology

## 2021-10-27 ENCOUNTER — Encounter: Payer: Self-pay | Admitting: Obstetrics & Gynecology

## 2021-10-27 VITALS — BP 108/64 | HR 86 | Ht 62.25 in | Wt 160.0 lb

## 2021-10-27 DIAGNOSIS — M810 Age-related osteoporosis without current pathological fracture: Secondary | ICD-10-CM

## 2021-10-27 DIAGNOSIS — Z01419 Encounter for gynecological examination (general) (routine) without abnormal findings: Secondary | ICD-10-CM | POA: Insufficient documentation

## 2021-10-27 DIAGNOSIS — Z780 Asymptomatic menopausal state: Secondary | ICD-10-CM

## 2021-10-27 DIAGNOSIS — Z1272 Encounter for screening for malignant neoplasm of vagina: Secondary | ICD-10-CM

## 2021-10-27 DIAGNOSIS — C541 Malignant neoplasm of endometrium: Secondary | ICD-10-CM

## 2021-10-27 NOTE — Progress Notes (Signed)
Rebecca Gordon 1955-06-09 [id number]   History:    66 y.o. G2P2L2 Married.  Daughter married in 2020.   RP:  Established patient presenting for annual gyn exam v   HPI: S/P Robotic TLH/BSO/Staging with Everitt Amber 07/10/2018.  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  All LNs Negative.  Stage 1 with no adjuvant treatment needed.  Alternating between Gyn-Onco and myself q6 months x 5 yrs. Postmenopause on no hormone replacement therapy.    No current pelvic pain.  Pap Neg 08/2020. Pap reflex today.  BD 09/2019 showed Osteoporosis worsening on Fosamax, now on Prolia. Overdue for injection, will come back next week.  Schedule BD here now.  Breasts normal.  Mammo 06/2021, will obtain report.  BMI 29.03.  Healthy nutrition.  Exercising regularly.  Quit smoking. Sober from alcohol x 4 yrs. Health labs with family physician.  COLONOSCOPY: 08/2015.  Had Brain surgery for aneurysm.  Will schedule knee surgery now.  C/O bilateral dependant oedema at ankles.  Past medical history,surgical history, family history and social history were all reviewed and documented in the EPIC chart.  Gynecologic History No LMP recorded. Patient has had a hysterectomy.  Obstetric History OB History  Gravida Para Term Preterm AB Living  '2 2 2     2  '$ SAB IAB Ectopic Multiple Live Births               # Outcome Date GA Lbr Len/2nd Weight Sex Delivery Anes PTL Lv  2 Term           1 Term              ROS: A ROS was performed and pertinent positives and negatives are included in the history. GENERAL: No fevers or chills. HEENT: No change in vision, no earache, sore throat or sinus congestion. NECK: No pain or stiffness. CARDIOVASCULAR: No chest pain or pressure. No palpitations. PULMONARY: No shortness of breath, cough or wheeze. GASTROINTESTINAL: No abdominal pain, nausea, vomiting or diarrhea, melena or bright red  blood per rectum. GENITOURINARY: No urinary frequency, urgency, hesitancy or dysuria. MUSCULOSKELETAL: No joint or muscle pain, no back pain, no recent trauma. DERMATOLOGIC: No rash, no itching, no lesions. ENDOCRINE: No polyuria, polydipsia, no heat or cold intolerance. No recent change in weight. HEMATOLOGICAL: No anemia or easy bruising or bleeding. NEUROLOGIC: No headache, seizures, numbness, tingling or weakness. PSYCHIATRIC: No depression, no loss of interest in normal activity or change in sleep pattern.     Exam:   BP 108/64   Pulse 86   Ht 5' 2.25" (1.581 m)   Wt 160 lb (72.6 kg)   SpO2 98%   BMI 29.03 kg/m   Body mass index is 29.03 kg/m.  General appearance : Well developed well nourished female. No acute distress HEENT: Eyes: no retinal hemorrhage or exudates,  Neck supple, trachea midline, no carotid bruits, no thyroidmegaly Lungs: Clear to auscultation, no rhonchi or wheezes, or rib retractions  Heart: Regular rate and rhythm, no murmurs or gallops Breast:Examined in sitting and supine position were symmetrical in appearance, no palpable masses or tenderness,  no skin retraction, no nipple inversion, no nipple discharge, no skin discoloration, no axillary or supraclavicular lymphadenopathy Abdomen: no palpable masses or tenderness, no rebound or guarding Extremities: no edema or skin discoloration or tenderness  Pelvic: Vulva: Normal             Vagina:  No gross lesions or discharge.  Pap reflex done  Cervix/Uterus  Absent  Adnexa  Without masses or tenderness  Anus: Normal   Assessment/Plan:  66 y.o. female for annual exam   1. Encounter for Papanicolaou smear of vagina as part of routine gynecological examination S/P Robotic TLH/BSO/Staging with Everitt Amber 07/10/2018.  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  All LNs Negative.  Stage 1 with no adjuvant  treatment needed.  Alternating between Gyn-Onco and myself q6 months x 5 yrs. Postmenopause on no hormone replacement therapy.    No current pelvic pain.  Pap Neg 08/2020. Pap reflex today.  BD 09/2019 showed Osteoporosis worsening on Fosamax, now on Prolia. Overdue for injection, will come back next week.  Schedule BD here now.  Breasts normal.  Mammo 06/2021, will obtain report.  BMI 29.03.  Healthy nutrition.  Exercising regularly.  Quit smoking. Sober from alcohol x 4 yrs. Health labs with family physician.  COLONOSCOPY: 08/2015.  Had Brain surgery for aneurysm.  Will schedule knee surgery now.  C/O bilateral dependant oedema at ankles. - Cytology - PAP( [place])  2. 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 Postmenopausal Well on no HRT.    4. Age-related osteoporosis without current pathological fracture Schedule BD here now.  Will f/u for Prolia Injection next week. - DG Bone Density; Future  Other orders - aspirin 325 MG tablet; Take 325 mg by mouth daily. - UNABLE TO FIND; Med Name: esomeprazole - Probiotic Product (ALIGN PO); Take by mouth.   Princess Bruins MD, 3:13 PM 10/27/2021

## 2021-10-30 LAB — CYTOLOGY - PAP: Diagnosis: NEGATIVE

## 2021-10-31 NOTE — Telephone Encounter (Signed)
Appt 11/03/2021

## 2021-11-01 DIAGNOSIS — M170 Bilateral primary osteoarthritis of knee: Secondary | ICD-10-CM | POA: Diagnosis not present

## 2021-11-01 DIAGNOSIS — M545 Low back pain, unspecified: Secondary | ICD-10-CM | POA: Diagnosis not present

## 2021-11-01 DIAGNOSIS — M1711 Unilateral primary osteoarthritis, right knee: Secondary | ICD-10-CM | POA: Diagnosis not present

## 2021-11-02 ENCOUNTER — Other Ambulatory Visit: Payer: Self-pay | Admitting: Family Medicine

## 2021-11-02 DIAGNOSIS — F418 Other specified anxiety disorders: Secondary | ICD-10-CM

## 2021-11-02 DIAGNOSIS — I10 Essential (primary) hypertension: Secondary | ICD-10-CM

## 2021-11-03 ENCOUNTER — Ambulatory Visit (INDEPENDENT_AMBULATORY_CARE_PROVIDER_SITE_OTHER): Payer: BC Managed Care – PPO | Admitting: *Deleted

## 2021-11-03 DIAGNOSIS — M810 Age-related osteoporosis without current pathological fracture: Secondary | ICD-10-CM | POA: Diagnosis not present

## 2021-11-03 MED ORDER — DENOSUMAB 60 MG/ML ~~LOC~~ SOSY
60.0000 mg | PREFILLED_SYRINGE | Freq: Once | SUBCUTANEOUS | Status: AC
  Administered 2021-11-03: 60 mg via SUBCUTANEOUS

## 2021-11-03 NOTE — Progress Notes (Signed)
pr

## 2021-11-17 ENCOUNTER — Encounter: Payer: Self-pay | Admitting: Obstetrics & Gynecology

## 2021-12-01 DIAGNOSIS — M5451 Vertebrogenic low back pain: Secondary | ICD-10-CM | POA: Diagnosis not present

## 2021-12-06 DIAGNOSIS — M1711 Unilateral primary osteoarthritis, right knee: Secondary | ICD-10-CM | POA: Diagnosis not present

## 2021-12-13 DIAGNOSIS — M1711 Unilateral primary osteoarthritis, right knee: Secondary | ICD-10-CM | POA: Diagnosis not present

## 2021-12-21 ENCOUNTER — Ambulatory Visit: Payer: BC Managed Care – PPO | Attending: Specialist | Admitting: Physical Therapy

## 2021-12-21 ENCOUNTER — Encounter: Payer: Self-pay | Admitting: Physical Therapy

## 2021-12-21 DIAGNOSIS — R278 Other lack of coordination: Secondary | ICD-10-CM | POA: Insufficient documentation

## 2021-12-21 DIAGNOSIS — R262 Difficulty in walking, not elsewhere classified: Secondary | ICD-10-CM | POA: Diagnosis not present

## 2021-12-21 DIAGNOSIS — M5442 Lumbago with sciatica, left side: Secondary | ICD-10-CM | POA: Insufficient documentation

## 2021-12-21 DIAGNOSIS — M25561 Pain in right knee: Secondary | ICD-10-CM | POA: Diagnosis not present

## 2021-12-21 DIAGNOSIS — M1711 Unilateral primary osteoarthritis, right knee: Secondary | ICD-10-CM | POA: Diagnosis not present

## 2021-12-21 DIAGNOSIS — M6281 Muscle weakness (generalized): Secondary | ICD-10-CM | POA: Diagnosis not present

## 2021-12-21 DIAGNOSIS — R293 Abnormal posture: Secondary | ICD-10-CM | POA: Diagnosis not present

## 2021-12-21 DIAGNOSIS — M5441 Lumbago with sciatica, right side: Secondary | ICD-10-CM | POA: Insufficient documentation

## 2021-12-21 DIAGNOSIS — G8929 Other chronic pain: Secondary | ICD-10-CM | POA: Insufficient documentation

## 2021-12-21 DIAGNOSIS — R600 Localized edema: Secondary | ICD-10-CM | POA: Diagnosis not present

## 2021-12-21 NOTE — Therapy (Signed)
OUTPATIENT PHYSICAL THERAPY THORACOLUMBAR EVALUATION   Patient Name: Rebecca Gordon MRN: [id number] DOB:12-Jul-1955, 66 y.o., female Today's Date: 12/21/2021   PT End of Session - 12/21/21 1310     Visit Number 1    Date for PT Re-Evaluation 03/01/22    PT Start Time 0758    PT Stop Time 0844    PT Time Calculation (min) 46 min    Activity Tolerance Patient tolerated treatment well;Patient limited by pain    Behavior During Therapy [facility] for tasks assessed/performed             Past Medical History:  Diagnosis Date   Alcoholism ([place])    Anal fissure    Aneurysm ([place]) 09/2016   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   Anxiety    Colon polyps    Depression    Endometrial cancer ([place])    Grade I   Endometrial polyp    Family history of brain cancer    Family history of prostate cancer    Former smoker 09/23/2016   GERD (gastroesophageal reflux disease)    Nexium   H/O clavicle fracture 2017   Right   High cholesterol    History of bronchitis    History of pneumonia    years ago   Hypertension    LVH (left ventricular hypertrophy) 09/23/2016   Mild, noted on ECHO   Numbness    left fingers after TIA   OA (osteoarthritis)    "right knee"   Osteoporosis 08/2017   T score -2.7   Panic attacks    Postmenopausal bleeding    Thyroid nodule 2019   Multiple   TIA (transient ischemic attack) 09/2016   Wears contact lenses    Past Surgical History:  Procedure Laterality Date   CLAVICLE SURGERY Right 2016   fracture repair   COLONOSCOPY  2017   multiples   CRANIOTOMY FOR ANEURYSM / VERTEBROBASILAR / CAROTID CIRCULATION  09/05/2021   DILATATION & CURETTAGE/HYSTEROSCOPY WITH MYOSURE N/A 05/26/2018   Procedure: DILATATION & CURETTAGE/HYSTEROSCOPY WITH MYOSURE;  Surgeon: Princess Bruins, MD;  Location: [place];  Service: Gynecology;  Laterality: N/A;   EYE SURGERY Right    "growth on eye removed"   FOOT SURGERY Right 08/2017   benign mass    HEMORRHOID SURGERY N/A 06/24/2018   Procedure: HEMORRHOIDECTOMY;  Surgeon: Michael Boston, MD;  Location: [place];  Service: General;  Laterality: N/A;   KNEE ARTHROSCOPY Right    Menicus repair   ORIF CLAVICULAR FRACTURE Right 04/28/2015   Procedure: REVISION ORIF RIGHT CLAVICAL FRACTURE, ALLOGRAFT BONE GRAFTING;  Surgeon: Justice Britain, MD;  Location: [place];  Service: Orthopedics;  Laterality: Right;   Peridontal Laser Surgery  01/2019   due to infection, x 2   PLACEMENT OF BREAST IMPLANTS Bilateral    ROBOTIC ASSISTED TOTAL HYSTERECTOMY WITH BILATERAL SALPINGO OOPHERECTOMY Bilateral 07/10/2018   Procedure: XI ROBOTIC ASSISTED TOTAL HYSTERECTOMY WITH BILATERAL SALPINGO OOPHORECTOMY;  Surgeon: Everitt Amber, MD;  Location: [facility];  Service: Gynecology;  Laterality: Bilateral;   SENTINEL NODE BIOPSY N/A 07/10/2018   Procedure: SENTINEL NODE BIOPSY, LEFT PELVIC NODE;  Surgeon: Everitt Amber, MD;  Location: [facility];  Service: Gynecology;  Laterality: N/A;   SPHINCTEROTOMY N/A 06/24/2018   Procedure: ANAL SPHINCTEROTOMY, ANORECTAL EXAM UNDER ANESTHESIA;  Surgeon: Michael Boston, MD;  Location: [place];  Service: General;  Laterality: N/A;   TONSILLECTOMY     Patient Active Problem List  Diagnosis Date Noted   Brain aneurysm 07/28/2021   Preop examination 07/07/2021   Eczema 07/07/2021   Myalgia 02/14/2021   Left leg pain 12/28/2020   Bilateral hearing loss 05/07/2020   Depression, major, single episode, moderate ([place]) 05/07/2020   Acute right-sided low back pain without sciatica 09/14/2019   Foul smelling urine 09/14/2019   Family history of colon cancer 02/23/2019   Family history of prostate cancer    Family history of brain cancer    Rash 01/13/2019   Hemorrhoids 08/01/2018   Anal fissure s/p internal sphincterotomy 06/24/2018 06/24/2018   Prolapsed internal hemorrhoids, grade 2&3, s/p ligation/pexy/hemorrhoidectomy 06/24/2018 06/24/2018   Endometrial  cancer ([place]) 06/05/2018   Rectal tear 03/24/2018   Preventative health care 08/09/2017   Depression with anxiety 08/09/2017   Hyperlipidemia LDL goal <100 08/09/2017   Seasonal allergies 08/09/2017   Osteoarthritis of knee 05/29/2017   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   Hx of anxiety disorder 04/03/2017   Smoking greater than 30 pack years 04/03/2017   Emphysema of lung ([place]) 04/03/2017   Arteriosclerotic cardiovascular disease (ASCVD) 04/03/2017   Alcoholism in remission ([place]) 04/02/2017   History of transient ischemic attack (TIA) 09/27/2016   Transient cerebral ischemia 09/27/2016   Gastroesophageal reflux disease 09/27/2016   Generalized anxiety disorder 09/27/2016   Gait disturbance 09/23/2016   Leukocytosis 09/23/2016   Primary hypertension 09/23/2016   HLD (hyperlipidemia) 09/23/2016   Former smoker 09/23/2016   Paresthesias 09/22/2016   SINUSITIS- ACUTE-NOS 05/12/2008    PCP: Roma Schanz  REFERRING PROVIDER: Susa Day, MD   REFERRING DIAG: 12/04/2021   Rationale for Evaluation and Treatment Rehabilitation  THERAPY DIAG:  Chronic pain of right knee  Muscle weakness (generalized)  Localized edema  Difficulty in walking, not elsewhere classified  Abnormal posture  Other lack of coordination  Acute bilateral low back pain with bilateral sciatica  ONSET DATE: 12/04/2021   SUBJECTIVE:                                                                                                                                                                                           SUBJECTIVE STATEMENT: Patient reports active recent history including surgery brain aneurysm clipping. She had been scheduled for R TKR, but had to put that off until next year due to the aneurysm. She started having cramping in BLE. X rays revealed scoliosis and lumbar degeneration.   PERTINENT HISTORY:  Brain  aneurysm-clipped, OA R knee, scoliosis, leg length discrepancy, R shorter than L  PAIN:  Are you having pain? Yes: NPRS scale: 6/10 Pain location: BLE Pain  description: numbness and cramping in BLE Aggravating factors: sitting Relieving factors: Sit on heating pad, compression socks   PRECAUTIONS: None  WEIGHT BEARING RESTRICTIONS No  FALLS:  Has patient fallen in last 6 months? 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  PLOF: Independent  PATIENT GOALS Manage pain   OBJECTIVE:   DIAGNOSTIC FINDINGS:  X rays reveal  SCREENING FOR RED FLAGS: Bowel or bladder incontinence: No Spinal tumors: No Cauda equina syndrome: No Compression fracture: No Abdominal aneurysm: No  COGNITION:  Overall cognitive status: Within functional limits for tasks assessed     SENSATION: Not tested  MUSCLE LENGTH: Hamstrings: Right 84 deg; Left 86 deg Thomas test: Hip flexors WNL, but quad tightness noted.  POSTURE: increased lumbar lordosis, anterior pelvic tilt, right pelvic obliquity, and weight shift left R knee genu varum in stance  PALPATION: R knee TTP around joint line  LUMBAR ROM:   Active  A/PROM  eval  Flexion To ankles  Extension WFL  Right lateral flexion Just above knee  Left lateral flexion Just above knee  Right rotation 70%  Left rotation 70%   (Blank rows = not tested)  LOWER EXTREMITY ROM:   B hip ROM mildly limited in IR/ER  Active  Right eval Left eval  Hip flexion    Hip extension    Hip abduction    Hip adduction    Hip internal rotation    Hip external rotation    Knee flexion 118 125  Knee extension    Ankle dorsiflexion    Ankle plantarflexion    Ankle inversion    Ankle eversion     (Blank rows = not  tested)  LOWER EXTREMITY MMT:    MMT Right eval Left eval  Hip flexion 4 4  Hip extension 3 4-  Hip abduction 4 4  Hip adduction    Hip internal rotation    Hip external rotation    Knee flexion 3- 4+  Knee extension 4- 4+  Ankle dorsiflexion 4+ 4+  Ankle plantarflexion    Ankle inversion    Ankle eversion     (Blank rows = not tested)  LUMBAR SPECIAL TESTS:  Straight leg raise test: Positive and Slump test: Positive  FUNCTIONAL TESTS:  5 times sit to stand: 15.18  GAIT: Distance walked: 44' Assistive device utilized: None Level of assistance: Complete Independence Comments: Patient walks in flexed posture with R knee varus thrust in stance, lateral trunk flexion moment in R stance.    TODAY'S TREATMENT  Initiated HEP, education   PATIENT EDUCATION:  Education details: POC, HEP Person educated: Patient Education method: Consulting civil engineer, Media planner, and Handouts Education comprehension: verbalized understanding and returned demonstration   HOME EXERCISE PROGRAM:  [id number]  ASSESSMENT:  CLINICAL IMPRESSION: Patient is a 66 y.o. who was seen today for physical therapy evaluation and treatment for LBP and muscle cramping in her BLE. She has had quite a difficult past months. She was scheduled for R TKR, but an aneurysm was found and the surgery was postponed until 2024. She had an aneurysm clipping 3 months ago. Her R knee is maintained in severe varus deformity which impedes her ambulation. She developed pain and spasms into B posterior legs. Subsequent assessment identified pre-existing scoliosis and L5-S1 degeneration.  Her back pain has most likely developed due to her knee deformity causing abnormal gait pattern with increased stress to her back. She will benefit from PT for strengthening of trunk and LE, stretching, and assessment for possible heel lift in R shoe to correct her leg length discrepancy caused by her R knee genu varum. We hope to be able to decrease  the back strain and strengthen the R LE muscles in preparation for her surgery. Patient also reports weight gain over the past 6-12 months, adding to her pain. She does have a recumbent bike at home which she can ride without pain   OBJECTIVE IMPAIRMENTS Abnormal gait, decreased activity tolerance, decreased balance, decreased coordination, decreased endurance, decreased mobility, difficulty walking, decreased ROM, decreased strength, increased muscle spasms, impaired flexibility, improper body mechanics, postural dysfunction, and pain.   ACTIVITY LIMITATIONS lifting, bending, squatting, sleeping, stairs, transfers, and locomotion level  PARTICIPATION LIMITATIONS: meal prep, cleaning, laundry, shopping, community activity, occupation, and yard work  PERSONAL FACTORS Past/current experiences and 1 comorbidity: R knee degeneration  are also affecting patient's functional outcome.   REHAB POTENTIAL: Good  CLINICAL DECISION MAKING: Evolving/moderate complexity  EVALUATION COMPLEXITY: Moderate   GOALS: Goals reviewed with patient? Yes  SHORT TERM GOALS: Target date: 01/18/2022  I with initial HEP Baseline: Goal status: INITIAL  2.  Assess for potential benefit of a heel lift to level her pelvis during gait. Baseline: has small heel insert currently, but pelvic obliquity remains. Goal status: INITIAL  LONG TERM GOALS: Target date: 03/01/2022  I with final HEP Baseline:  Goal status: INITIAL  2. 5 X STS in < 12 seconds  Baseline: 15.18 Goal status: INITIAL  3.  Minimize compensatory movements in gait with strengthening, heel lift to level pelvis, possibly AD to decrease strain on R knee. Baseline: Severe R knee genu varum in stance, lateral trunk flexion in R stance, flexed posture, antalgic gait on R. Goal status: INITIAL  4.  Increase RLE strength by 1 muscle grade in weak muscle groups. Baseline:  Goal status: INITIAL  5.  Patient will report no muscle cramping in  posterior legs in the evenings. Baseline: consistent cramps ans spasms Goal status: INITIAL  6.  Assess her work set up and her posture and support in her work chair, making appropriate adjustments as needed. Baseline:  Goal status: INITIAL   PLAN: PT FREQUENCY: 1-2x/week  PT DURATION: 10 weeks  PLANNED INTERVENTIONS: Therapeutic exercises, Therapeutic activity, Neuromuscular re-education, Balance training, Gait training, Patient/Family education, Self Care, Joint mobilization, Dry Needling, Electrical stimulation, Cryotherapy, Moist heat, Taping, Vasopneumatic device, Ultrasound, Ionotophoresis '4mg'$ /ml Dexamethasone, and Manual therapy.  PLAN FOR NEXT SESSION: Update HEP with strengthening for trunk and LEs.   Marcelina Morel, DPT 12/21/2021, 1:18 PM

## 2021-12-25 ENCOUNTER — Ambulatory Visit: Payer: BC Managed Care – PPO | Admitting: Physical Therapy

## 2021-12-30 ENCOUNTER — Encounter (HOSPITAL_BASED_OUTPATIENT_CLINIC_OR_DEPARTMENT_OTHER): Payer: Self-pay | Admitting: Emergency Medicine

## 2021-12-30 ENCOUNTER — Emergency Department (HOSPITAL_BASED_OUTPATIENT_CLINIC_OR_DEPARTMENT_OTHER): Payer: BC Managed Care – PPO

## 2021-12-30 ENCOUNTER — Emergency Department (HOSPITAL_BASED_OUTPATIENT_CLINIC_OR_DEPARTMENT_OTHER)
Admission: EM | Admit: 2021-12-30 | Discharge: 2021-12-30 | Disposition: A | Discharge status: Home / Self Care | Payer: BC Managed Care – PPO | Attending: Emergency Medicine | Admitting: Emergency Medicine

## 2021-12-30 ENCOUNTER — Other Ambulatory Visit: Payer: Self-pay

## 2021-12-30 DIAGNOSIS — Z87891 Personal history of nicotine dependence: Secondary | ICD-10-CM | POA: Diagnosis not present

## 2021-12-30 DIAGNOSIS — Z79899 Other long term (current) drug therapy: Secondary | ICD-10-CM | POA: Diagnosis not present

## 2021-12-30 DIAGNOSIS — I700 Atherosclerosis of aorta: Secondary | ICD-10-CM | POA: Diagnosis not present

## 2021-12-30 DIAGNOSIS — R600 Localized edema: Secondary | ICD-10-CM | POA: Insufficient documentation

## 2021-12-30 DIAGNOSIS — Z7982 Long term (current) use of aspirin: Secondary | ICD-10-CM | POA: Diagnosis not present

## 2021-12-30 DIAGNOSIS — I10 Essential (primary) hypertension: Secondary | ICD-10-CM | POA: Insufficient documentation

## 2021-12-30 DIAGNOSIS — M7989 Other specified soft tissue disorders: Secondary | ICD-10-CM | POA: Diagnosis not present

## 2021-12-30 DIAGNOSIS — J811 Chronic pulmonary edema: Secondary | ICD-10-CM | POA: Diagnosis not present

## 2021-12-30 LAB — CBC
HCT: 38.8 % (ref 36.0–46.0)
Hemoglobin: 13.2 g/dL (ref 12.0–15.0)
MCH: 31.3 pg (ref 26.0–34.0)
MCHC: 34 g/dL (ref 30.0–36.0)
MCV: 91.9 fL (ref 80.0–100.0)
Platelets: 275 10*3/uL (ref 150–400)
RBC: 4.22 MIL/uL (ref 3.87–5.11)
RDW: 12.5 % (ref 11.5–15.5)
WBC: 8.2 10*3/uL (ref 4.0–10.5)
nRBC: 0 % (ref 0.0–0.2)

## 2021-12-30 LAB — BASIC METABOLIC PANEL
Anion gap: 7 (ref 5–15)
BUN: 26 mg/dL — ABNORMAL HIGH (ref 8–23)
CO2: 27 mmol/L (ref 22–32)
Calcium: 8.6 mg/dL — ABNORMAL LOW (ref 8.9–10.3)
Chloride: 104 mmol/L (ref 98–111)
Creatinine, Ser: 0.72 mg/dL (ref 0.44–1.00)
GFR, Estimated: >60 mL/min (ref >60)
Glucose, Bld: 141 mg/dL — ABNORMAL HIGH (ref 70–99)
Potassium: 3.6 mmol/L (ref 3.5–5.1)
Sodium: 138 mmol/L (ref 135–145)

## 2021-12-30 LAB — BRAIN NATRIURETIC PEPTIDE: B Natriuretic Peptide: 37.5 pg/mL (ref 0.0–100.0)

## 2021-12-30 NOTE — Discharge Instructions (Signed)
Work-up without evidence of any fluid on the lungs or any kidney function abnormality.  May be representative of some right-sided heart failure.  Blood pressure is elevated here you could double up on your hydrochlorothiazide since your electrolytes are very normal and take 50 mg a day.  Make an appointment follow-up with your doctor and also would recommend following up with cardiology.  Return for any new or worse symptoms.

## 2021-12-30 NOTE — ED Provider Notes (Signed)
[facility] EMERGENCY DEPARTMENT Provider Note   CSN: [id number] Arrival date & time: 12/30/21  1631     History  Chief Complaint  Patient presents with   Leg Swelling    Bilateral     Rebecca Gordon is a 66 y.o. female.  Patient reports bilateral lower extremity swelling for the past several weeks.  Is gotten worse over the last week.  Denies any chest pain denies any shortness of breath.  Denies any injuries to the legs.  Patient noting swelling to the legs ankles and feet.  Patient does have a primary care doctor but is not following up on this.  Patient is taking meloxicam patient has a history of high blood pressure and is currently taking hydrochlorothiazide as well as Norvasc and Cozaar.  And Toprol XL.  Past medical history significant for high cholesterol hypertension former smoker quit in 2018 left ventricular hypertrophy probably secondary to the hypertension past surgical history significant for tonsillectomy open reduction internal fixation of a clavicle fracture craniotomy for aneurysmal vertebrobasilar carotid circulation in May 2023.       Home Medications Prior to Admission medications   Medication Sig Start Date End Date Taking? Authorizing Provider  amLODipine (NORVASC) 5 MG tablet TAKE ONE TABLET BY MOUTH DAILY 11/02/21   [name], Alferd Apa, DO  aspirin 325 MG tablet Take 325 mg by mouth daily.    [provider]  buPROPion (WELLBUTRIN XL) 150 MG 24 hr tablet TAKE TWO TABLETS BY MOUTH DAILY 11/02/21   [name], Alferd Apa, DO  cholecalciferol (VITAMIN D) 1000 units tablet Take 1,000 Units by mouth every morning.     [provider]  ciclopirox (LOPROX) 0.77 % SUSP Use on affected area bid 07/07/21   [name], Alferd Apa, DO  desvenlafaxine (PRISTIQ) 50 MG 24 hr tablet TAKE ONE TABLET BY MOUTH DAILY 11/02/21   [name], Kendrick Fries R, DO  hydrochlorothiazide (HYDRODIURIL) 25 MG tablet TAKE ONE TABLET BY MOUTH DAILY 11/02/21   [name],  Alferd Apa, DO  losartan (COZAAR) 100 MG tablet TAKE ONE TABLET BY MOUTH DAILY 11/02/21   [name], DO  Melatonin 5 MG CHEW  05/18/19   [provider]  metoprolol succinate (TOPROL-XL) 50 MG 24 hr tablet TAKE ONE TABLET BY MOUTH DAILY **TAKE WITH OR IMMEDIATLEY AFTER A MEAL** 08/07/21   [name], Kendrick Fries R, DO  pravastatin (PRAVACHOL) 40 MG tablet TAKE ONE TABLET BY MOUTH DAILY 11/02/21   [name], DO  Probiotic Product (ALIGN PO) Take by mouth.    [provider]  tizanidine (ZANAFLEX) 2 MG capsule Take 1 capsule (2 mg total) by mouth 3 (three) times daily as needed for muscle spasms. 07/07/21   [name], DO  UNABLE TO FIND Med Name: esomeprazole    [provider]      Allergies    Augmentin  [amoxicillin-pot clavulanate]    Review of Systems   Review of Systems  Constitutional:  Negative for chills and fever.  HENT:  Negative for ear pain and sore throat.   Eyes:  Negative for pain and visual disturbance.  Respiratory:  Negative for cough and shortness of breath.   Cardiovascular:  Positive for leg swelling. Negative for chest pain and palpitations.  Gastrointestinal:  Negative for abdominal pain and vomiting.  Genitourinary:  Negative for dysuria and hematuria.  Musculoskeletal:  Negative for arthralgias and back pain.  Skin:  Negative for color change  and rash.  Neurological:  Negative for seizures and syncope.  All other systems reviewed and are negative.   Physical Exam Updated Vital Signs BP (!) 151/55   Pulse 83   Temp 98.4 F (36.9 C) (Oral)   Resp 18   Ht 1.581 m (5' 2.25")   Wt 76.6 kg   SpO2 98%   BMI 30.64 kg/m  Physical Exam Vitals and nursing note reviewed.  Constitutional:      General: She is not in acute distress.    Appearance: Normal appearance. She is well-developed.  HENT:     Head: Normocephalic and atraumatic.     Mouth/Throat:     Mouth: Mucous membranes are moist.  Eyes:      Conjunctiva/sclera: Conjunctivae normal.     Pupils: Pupils are equal, round, and reactive to light.  Cardiovascular:     Rate and Rhythm: Normal rate and regular rhythm.     Heart sounds: No murmur heard. Pulmonary:     Effort: Pulmonary effort is normal. No respiratory distress.     Breath sounds: Normal breath sounds. No wheezing, rhonchi or rales.  Abdominal:     Palpations: Abdomen is soft.     Tenderness: There is no abdominal tenderness.  Musculoskeletal:        General: Swelling present. No tenderness.     Cervical back: Normal range of motion and neck supple.     Right lower leg: Edema present.     Left lower leg: Edema present.  Skin:    General: Skin is warm and dry.     Capillary Refill: Capillary refill takes less than 2 seconds.  Neurological:     General: No focal deficit present.     Mental Status: She is alert and oriented to person, place, and time.  Psychiatric:        Mood and Affect: Mood normal.     ED Results / Procedures / Treatments   Labs (all labs ordered are listed, but only abnormal results are displayed) Labs Reviewed  BASIC METABOLIC PANEL - Abnormal; Notable for the following components:      Result Value   Glucose, Bld 141 (*)    BUN 26 (*)    Calcium 8.6 (*)    All other components within normal limits  CBC  BRAIN NATRIURETIC PEPTIDE    EKG None  Radiology DG Chest 2 View  Result Date: 12/30/2021 CLINICAL DATA:  Lower extremity edema. S4 cardiomegaly/pulmonary edema. EXAM: CHEST - 2 VIEW COMPARISON:  None Available. FINDINGS: The heart size and mediastinal contours are within normal limits. Atherosclerotic calcification of the aortic arch. Mild vascular congestion without evidence of pulmonary edema or pleural effusion. Plate and screw fixation of the left clavicular fracture. No acute osseous abnormality. IMPRESSION: 1. Heart is normal in size. 2. Mild pulmonary vascular congestion without evidence of pulmonary edema or pleural  effusion. No focal consolidation. Electronically Signed   By: Keane Police D.O.   On: 12/30/2021 17:11    Procedures Procedures    Medications Ordered in ED Medications - No data to display  ED Course/ Medical Decision Making/ A&P                           Medical Decision Making Amount and/or Complexity of Data Reviewed Labs: ordered.   Work-up without any acute findings.  BNP is normal CBC no leukocytosis hemoglobin is normal basic metabolic panel normal.  Renal functions normal. Chest  x-ray shows mild pulmonary vascular congestion without evidence of pulmonary edema or pleural effusion.  Heart size is normal.  But with BNP being normal no evidence of any propensity for fluid on the lungs.  Patient does have bilateral pitting edema to both legs.  No skin breakdown some slight erythema some could be somewhat of a chronic component.  No calf tenderness.  Suspect concerns for right heart failure needs follow-up with cardiology and her primary care doctor.  Since her electrolytes are normal blood pressure little elevated here today blood pressure could be playing a role with this as well recommend trial of increasing her hydrochlorothiazide to 50 mg a day.  For close follow-up with primary care doctor and make an appointment follow-up with cardiology.  Returning for any new or worse symptoms.  Final Clinical Impression(s) / ED Diagnoses Final diagnoses:  Bilateral lower extremity edema    Rx / DC Orders ED Discharge Orders     None         Fredia Sorrow, MD 12/30/21 1900

## 2021-12-30 NOTE — ED Triage Notes (Signed)
Pt POV reports BL leg swelling, progressively worsening for a few weeks. Denies cardiac hx. +2 pitting edema noted in BL legs. Denies ShOB.   Recently prescribed meloxicam, stopped taking 2 days ago.    Takes HCTZ '25mg'$  daily.

## 2022-01-04 ENCOUNTER — Ambulatory Visit: Payer: BC Managed Care – PPO | Admitting: Physical Therapy

## 2022-01-08 ENCOUNTER — Ambulatory Visit: Payer: BC Managed Care – PPO | Attending: Internal Medicine | Admitting: Internal Medicine

## 2022-01-08 ENCOUNTER — Encounter: Payer: Self-pay | Admitting: Internal Medicine

## 2022-01-08 VITALS — BP 132/72 | HR 82 | Ht 63.0 in | Wt 165.6 lb

## 2022-01-08 DIAGNOSIS — R600 Localized edema: Secondary | ICD-10-CM | POA: Diagnosis not present

## 2022-01-08 NOTE — Patient Instructions (Signed)
Medication Instructions:  Your physician recommends that you continue on your current medications as directed. Please refer to the Current Medication list given to you today.  *If you need a refill on your cardiac medications before your next appointment, please call your pharmacy*   Follow-Up: At [facility], you and your health needs are our priority.  As part of our continuing mission to provide you with exceptional heart care, we have created designated Provider Care Teams.  These Care Teams include your primary Cardiologist (physician) and Advanced Practice Providers (APPs -  Physician Assistants and Nurse Practitioners) who all work together to provide you with the care you need, when you need it.  We recommend signing up for the patient portal called "MyChart".  Sign up information is provided on this After Visit Summary.  MyChart is used to connect with patients for Virtual Visits (Telemedicine).  Patients are able to view lab/test results, encounter notes, upcoming appointments, etc.  Non-urgent messages can be sent to your provider as well.   To learn more about what you can do with MyChart, go to [email or web address].    Your next appointment:   We will see you on an as needed basis.  Provider:   Mary Branch, MD 

## 2022-01-08 NOTE — Progress Notes (Signed)
Cardiology Office Note:    Date:  01/08/2022   ID:  Jaymie, Mckiddy 14-Dec-1955, MRN [id number]  PCP:  [name], [facility] Providers Cardiologist:  None     Referring MD: [name], Alferd Apa, *   No chief complaint on file. Leg swelling  History of Present Illness:    Rebecca Gordon is a 66 y.o. female with a hx of ETOH, TIA cartoid duplex did not show obstructive dx, cerebral aneurysm s/p clipping ([facility])   echo 2018 EF nl, no significant valve dx, endometrial CA 2020 s/p hysterectomy, referral from the ED she had ankle swelling. She notes her leg got more swollen and tight.  She was told to increase her Hctz. Bnp was normal. Blood pressure is in good control. Normal EKG. She notes knee pain on the R. Cxray was unremarkable. Mother had CHF. No prior cardiac w/u. She is a former smoker, for 30-40 years. Her exercise is limited 2/2 knee pain. She denies CP/SOB.  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   Anxiety    Colon polyps    Depression    Endometrial cancer ([facility])    Grade I   Endometrial polyp    Family history of brain cancer    Family history of prostate cancer    Former smoker 09/23/2016   GERD (gastroesophageal reflux disease)    Nexium   H/O clavicle fracture 2017   Right   High cholesterol    History of bronchitis    History of pneumonia    years ago   Hypertension    LVH (left ventricular hypertrophy) 09/23/2016   Mild, noted on ECHO   Numbness    left fingers after TIA   OA (osteoarthritis)    "right knee"   Osteoporosis 08/2017   T score -2.7   Panic attacks    Postmenopausal bleeding    Thyroid nodule 2019   Multiple   TIA (transient ischemic attack) 09/2016   Wears contact lenses     Past Surgical History:  Procedure Laterality Date   CLAVICLE SURGERY Right 2016   fracture repair   COLONOSCOPY  2017    multiples   CRANIOTOMY FOR ANEURYSM / VERTEBROBASILAR / CAROTID CIRCULATION  09/05/2021   DILATATION & CURETTAGE/HYSTEROSCOPY WITH MYOSURE N/A 05/26/2018   Procedure: DILATATION & CURETTAGE/HYSTEROSCOPY WITH MYOSURE;  Surgeon: Princess Bruins, MD;  Location: [place];  Service: Gynecology;  Laterality: N/A;   EYE SURGERY Right    "growth on eye removed"   FOOT SURGERY Right 08/2017   benign mass   HEMORRHOID SURGERY N/A 06/24/2018   Procedure: HEMORRHOIDECTOMY;  Surgeon: Michael Boston, MD;  Location: [place];  Service: General;  Laterality: N/A;   KNEE ARTHROSCOPY Right    Menicus repair   ORIF CLAVICULAR FRACTURE Right 04/28/2015   Procedure: REVISION ORIF RIGHT CLAVICAL FRACTURE, ALLOGRAFT BONE GRAFTING;  Surgeon: Justice Britain, MD;  Location: [place];  Service: Orthopedics;  Laterality: Right;   Peridontal Laser Surgery  01/2019   due to infection, x 2   PLACEMENT OF BREAST IMPLANTS Bilateral    ROBOTIC ASSISTED TOTAL HYSTERECTOMY WITH BILATERAL SALPINGO OOPHERECTOMY Bilateral 07/10/2018   Procedure: XI ROBOTIC ASSISTED TOTAL HYSTERECTOMY WITH BILATERAL SALPINGO OOPHORECTOMY;  Surgeon: Everitt Amber, MD;  Location: [facility];  Service: Gynecology;  Laterality: Bilateral;   SENTINEL NODE BIOPSY N/A 07/10/2018   Procedure: SENTINEL NODE BIOPSY, LEFT PELVIC NODE;  Surgeon: Everitt Amber, MD;  Location: [facility];  Service: Gynecology;  Laterality: N/A;   SPHINCTEROTOMY N/A 06/24/2018   Procedure: ANAL SPHINCTEROTOMY, ANORECTAL EXAM UNDER ANESTHESIA;  Surgeon: Michael Boston, MD;  Location: [place];  Service: General;  Laterality: N/A;   TONSILLECTOMY      Current Medications: Current Meds  Medication Sig   amLODipine (NORVASC) 5 MG tablet TAKE ONE TABLET BY MOUTH DAILY   aspirin 325 MG tablet Take 325 mg by mouth daily.   b complex vitamins capsule Take 1 capsule by mouth daily.   cholecalciferol (VITAMIN D) 1000 units tablet Take 1,000  Units by mouth every morning.    ciclopirox (LOPROX) 0.77 % SUSP Use on affected area bid   Coenzyme Q10 (CO Q 10 PO) Take 1 capsule by mouth daily in the afternoon.   [employer] ESOMEPRAZOLE MAGNESIUM PO Take 1 capsule by mouth daily in the afternoon.   desvenlafaxine (PRISTIQ) 50 MG 24 hr tablet TAKE ONE TABLET BY MOUTH DAILY   hydrochlorothiazide (HYDRODIURIL) 25 MG tablet TAKE ONE TABLET BY MOUTH DAILY   losartan (COZAAR) 100 MG tablet TAKE ONE TABLET BY MOUTH DAILY   Melatonin 5 MG CHEW    metoprolol succinate (TOPROL-XL) 50 MG 24 hr tablet TAKE ONE TABLET BY MOUTH DAILY **TAKE WITH OR IMMEDIATLEY AFTER A MEAL**   pravastatin (PRAVACHOL) 40 MG tablet TAKE ONE TABLET BY MOUTH DAILY   Probiotic Product (ALIGN PO) Take by mouth.   Turmeric (QC TUMERIC COMPLEX PO) Take 1 capsule by mouth daily in the afternoon.   UNABLE TO FIND Med Name: esomeprazole     Allergies:   Augmentin  [amoxicillin-pot clavulanate]   Social History   Socioeconomic History   Marital status: Married    Spouse name: Not on file   Number of children: 2   Years of education: College   Highest education level: Not on file  Occupational History   Occupation: Unemployed  Tobacco Use   Smoking status: Former    Packs/day: 1.00    Years: 40.00    Total pack years: 40.00    Types: Cigarettes    Quit date: 11/2018    Years since quitting: 3.1   Smokeless tobacco: Never   Tobacco comments:    Previous 1 pack a day  Vaping Use   Vaping Use: Never used  Substance and Sexual Activity   Alcohol use: Not Currently   Drug use: No   Sexual activity: Yes    Partners: Male    Birth control/protection: Post-menopausal    Comment: 1st intercourse- 71, partners- more than 5  Other Topics Concern   Not on file  Social History Narrative   Lives at home w/ her husband   Right-handed   Caffeine: 2-3 cups per day   Social Determinants of Health   Financial Resource Strain: Not on file  Food Insecurity: Not on file   Transportation Needs: Not on file  Physical Activity: Not on file  Stress: Not on file  Social Connections: Not on file     Family History: The patient's family history includes Brain cancer (age of onset: 74) in her father; Cancer in her paternal uncle; Heart failure in her mother; Hypertension in her father; Leukemia (age of onset: 5) in her cousin; Prostate cancer (age of onset: 49) in her father; Skin cancer in her father.  ROS:  Please see the history of present illness.     All other systems reviewed and are negative.  EKGs/Labs/Other Studies Reviewed:    The following studies were reviewed today:   EKG:  EKG is  ordered today.  The ekg ordered today demonstrates   01/08/2022- NSR  Recent Labs: 02/20/2021: Magnesium 2.0 07/07/2021: ALT 16 12/30/2021: B Natriuretic Peptide 37.5; BUN 26; Creatinine, Ser 0.72; Hemoglobin 13.2; Platelets 275; Potassium 3.6; Sodium 138   Recent Lipid Panel    Component Value Date/Time   CHOL 203 (H) 07/07/2021 0930   TRIG 68.0 07/07/2021 0930   HDL 60.90 07/07/2021 0930   CHOLHDL 3 07/07/2021 0930   VLDL 13.6 07/07/2021 0930   LDLCALC 128 (H) 07/07/2021 0930     Risk Assessment/Calculations:     Physical Exam:    VS:  Vitals:   01/08/22 1314  BP: 132/72  Pulse: 82  SpO2: 96%    Wt Readings from Last 3 Encounters:  01/08/22 165 lb 9.6 oz (75.1 kg)  12/30/21 168 lb 14 oz (76.6 kg)  10/27/21 160 lb (72.6 kg)     GEN:  Well nourished, well developed in no acute distress HEENT: Normal NECK: No JVD; No carotid bruits LYMPHATICS: No lymphadenopathy CARDIAC: RRR, no murmurs, rubs, gallops RESPIRATORY:  Clear to auscultation without rales, wheezing or rhonchi  ABDOMEN: Soft, non-tender, non-distended MUSCULOSKELETAL:  No edema; No deformity  SKIN: Warm and dry NEUROLOGIC:  Alert and oriented x 3 PSYCHIATRIC:  Normal affect   ASSESSMENT:    Ankle swelling: resolved. non cardiac related. She has no signs of CHF. If recurs  she can stop norvasc and change to a different antihypertensive such spironolactone. No real benefit with HCTz 50 mg daily, we discussed this. She can do lasix PRN as well if swelling recurs and persists. We discussed can be related to lymphadema. Discussed compression stockings PLAN:    In order of problems listed above:  No further cardiac w/u, no changes           Medication Adjustments/Labs and Tests Ordered: Current medicines are reviewed at length with the patient today.  Concerns regarding medicines are outlined above.  Orders Placed This Encounter  Procedures   EKG 12-Lead   No orders of the defined types were placed in this encounter.   Patient Instructions  Medication Instructions:  Your physician recommends that you continue on your current medications as directed. Please refer to the Current Medication list given to you today.  *If you need a refill on your cardiac medications before your next appointment, please call your pharmacy*   Follow-Up: At [facility], you and your health needs are our priority.  As part of our continuing mission to provide you with exceptional heart care, we have created designated Provider Care Teams.  These Care Teams include your primary Cardiologist (physician) and Advanced Practice Providers (APPs -  Physician Assistants and Nurse Practitioners) who all work together to provide you with the care you need, when you need it.  We recommend signing up for the patient portal called "MyChart".  Sign up information is provided on this After Visit Summary.  MyChart is used to connect with patients for Virtual Visits (Telemedicine).  Patients are able to view lab/test results, encounter notes, upcoming appointments, etc.  Non-urgent messages can be sent to your provider as well.   To learn more about what you can do with MyChart, go to [email or web address].    Your next appointment:   We will  see you on an as needed basis  Provider:    Phineas Inches, MD   Signed, Janina Mayo, MD  01/08/2022 2:03 PM    [place]

## 2022-01-11 ENCOUNTER — Ambulatory Visit: Payer: BC Managed Care – PPO | Admitting: Physical Therapy

## 2022-01-16 ENCOUNTER — Ambulatory Visit: Payer: BC Managed Care – PPO | Admitting: Family Medicine

## 2022-01-16 ENCOUNTER — Encounter: Payer: Self-pay | Admitting: Family Medicine

## 2022-01-16 VITALS — BP 128/78 | HR 82 | Temp 98.5°F | Resp 16 | Ht 63.0 in | Wt 168.6 lb

## 2022-01-16 DIAGNOSIS — M79605 Pain in left leg: Secondary | ICD-10-CM | POA: Diagnosis not present

## 2022-01-16 DIAGNOSIS — E782 Mixed hyperlipidemia: Secondary | ICD-10-CM | POA: Diagnosis not present

## 2022-01-16 DIAGNOSIS — M545 Low back pain, unspecified: Secondary | ICD-10-CM

## 2022-01-16 DIAGNOSIS — E785 Hyperlipidemia, unspecified: Secondary | ICD-10-CM

## 2022-01-16 DIAGNOSIS — E559 Vitamin D deficiency, unspecified: Secondary | ICD-10-CM

## 2022-01-16 DIAGNOSIS — I10 Essential (primary) hypertension: Secondary | ICD-10-CM | POA: Diagnosis not present

## 2022-01-16 DIAGNOSIS — M1711 Unilateral primary osteoarthritis, right knee: Secondary | ICD-10-CM

## 2022-01-16 DIAGNOSIS — M7989 Other specified soft tissue disorders: Secondary | ICD-10-CM

## 2022-01-16 DIAGNOSIS — F418 Other specified anxiety disorders: Secondary | ICD-10-CM

## 2022-01-16 DIAGNOSIS — F19980 Other psychoactive substance use, unspecified with psychoactive substance-induced anxiety disorder: Secondary | ICD-10-CM

## 2022-01-16 MED ORDER — LOSARTAN POTASSIUM 100 MG PO TABS: 100.0000 mg | ORAL_TABLET | Freq: Every day | ORAL | 1 refills | Status: DC

## 2022-01-16 MED ORDER — METOPROLOL SUCCINATE ER 50 MG PO TB24: ORAL_TABLET | ORAL | 1 refills | Status: DC

## 2022-01-16 MED ORDER — HYDROCHLOROTHIAZIDE 25 MG PO TABS: 25.0000 mg | ORAL_TABLET | Freq: Every day | ORAL | 1 refills | Status: DC

## 2022-01-16 MED ORDER — CELECOXIB 100 MG PO CAPS: 100.0000 mg | ORAL_CAPSULE | Freq: Two times a day (BID) | ORAL | 3 refills | Status: DC

## 2022-01-16 MED ORDER — AMLODIPINE BESYLATE 5 MG PO TABS: 5.0000 mg | ORAL_TABLET | Freq: Every day | ORAL | 1 refills | Status: DC

## 2022-01-16 MED ORDER — HYDROCHLOROTHIAZIDE 25 MG PO TABS: ORAL_TABLET | ORAL | 1 refills | Status: AC

## 2022-01-16 MED ORDER — TIZANIDINE HCL 2 MG PO CAPS: 2.0000 mg | ORAL_CAPSULE | Freq: Three times a day (TID) | ORAL | 1 refills | Status: DC | PRN

## 2022-01-16 MED ORDER — LOSARTAN POTASSIUM 100 MG PO TABS: 100.0000 mg | ORAL_TABLET | Freq: Every day | ORAL | 1 refills | Status: AC

## 2022-01-16 MED ORDER — METOPROLOL SUCCINATE ER 50 MG PO TB24: ORAL_TABLET | ORAL | 1 refills | Status: AC

## 2022-01-16 MED ORDER — DESVENLAFAXINE SUCCINATE ER 50 MG PO TB24: 50.0000 mg | ORAL_TABLET | Freq: Every day | ORAL | 1 refills | Status: DC

## 2022-01-16 NOTE — Assessment & Plan Note (Signed)
Well controlled, no changes to meds. Encouraged heart healthy diet such as the DASH diet and exercise as tolerated.  °

## 2022-01-16 NOTE — Assessment & Plan Note (Signed)
Per ortho.  

## 2022-01-16 NOTE — Progress Notes (Signed)
Subjective:   By signing my name below, I, [name], attest that this documentation has been prepared under the direction and in the presence of [name], DO  01/16/2022    Patient ID: Rebecca Gordon, female    DOB: [birth year], 66 y.o.   MRN: [id number]  Chief Complaint  Patient presents with   Hyperlipidemia   Follow-up   Hypertension    HPI Patient is in today for an office visit  She is requesting a refill on 25 mg Hydrodiuril, 100 mg Losartan, 50 mg Toprol-xl, 50 mg Pristiq.  She is complaining of right knee pain and is scheduled to have a second knee replacement surgery. She is having difficulty walking because of the knee pain. She is also having muscle spasms in both legs. She is requesting medication for her knee pain.  She is complaining of swollen ankles. One of her providers doubled her diuretic medication. She was referred to a cardiologist and everything appears normal. BP Readings from Last 3 Encounters:  01/16/22 128/78  01/08/22 132/72  12/30/21 127/70   Pulse Readings from Last 3 Encounters:  01/16/22 82  01/08/22 82  12/30/21 71   Patient is UTD on influenza vaccination. Patient reports of gaining weight. 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   Anxiety    Colon polyps    Depression    Endometrial cancer ([facility])    Grade I   Endometrial polyp    Family history of brain cancer    Family history of prostate cancer    Former smoker 09/23/2016   GERD (gastroesophageal reflux disease)    Nexium   H/O clavicle fracture 2017   Right   High cholesterol    History of bronchitis    History of pneumonia    years ago   Hypertension    LVH (left ventricular hypertrophy) 09/23/2016   Mild,  noted on ECHO   Numbness    left fingers after TIA   OA (osteoarthritis)    "right knee"   Osteoporosis 08/2017   T score -2.7   Panic attacks    Postmenopausal bleeding    Thyroid nodule 2019   Multiple   TIA (transient ischemic attack) 09/2016   Wears contact lenses     Past Surgical History:  Procedure Laterality Date   CLAVICLE SURGERY Right 2016   fracture repair   COLONOSCOPY  2017   multiples   CRANIOTOMY FOR ANEURYSM / VERTEBROBASILAR / CAROTID CIRCULATION  09/05/2021   DILATATION & CURETTAGE/HYSTEROSCOPY WITH MYOSURE N/A 05/26/2018   Procedure: DILATATION & CURETTAGE/HYSTEROSCOPY WITH MYOSURE;  Surgeon: Princess Bruins, MD;  Location: [facility];  Service: Gynecology;  Laterality: N/A;   EYE SURGERY Right    "growth on eye removed"   FOOT SURGERY Right 08/2017   benign mass   HEMORRHOID SURGERY N/A 06/24/2018   Procedure: HEMORRHOIDECTOMY;  Surgeon: Michael Boston, MD;  Location: [facility];  Service: General;  Laterality: N/A;   KNEE ARTHROSCOPY Right    Menicus repair   ORIF CLAVICULAR FRACTURE Right 04/28/2015   Procedure: REVISION ORIF RIGHT CLAVICAL FRACTURE, ALLOGRAFT BONE GRAFTING;  Surgeon:  Justice Britain, MD;  Location: [facility];  Service: Orthopedics;  Laterality: Right;   Peridontal Laser Surgery  01/2019   due to infection, x 2   PLACEMENT OF BREAST IMPLANTS Bilateral    ROBOTIC ASSISTED TOTAL HYSTERECTOMY WITH BILATERAL SALPINGO OOPHERECTOMY Bilateral 07/10/2018   Procedure: XI ROBOTIC ASSISTED TOTAL HYSTERECTOMY WITH BILATERAL SALPINGO OOPHORECTOMY;  Surgeon: Everitt Amber, MD;  Location: [facility];  Service: Gynecology;  Laterality: Bilateral;   SENTINEL NODE BIOPSY N/A 07/10/2018   Procedure: SENTINEL NODE BIOPSY, LEFT PELVIC NODE;  Surgeon: Everitt Amber, MD;  Location: [facility];  Service: Gynecology;  Laterality: N/A;   SPHINCTEROTOMY N/A 06/24/2018   Procedure: ANAL SPHINCTEROTOMY, ANORECTAL EXAM UNDER ANESTHESIA;  Surgeon:  Michael Boston, MD;  Location: [facility];  Service: General;  Laterality: N/A;   TONSILLECTOMY      Family History  Problem Relation Age of Onset   Heart failure Mother    Hypertension Father    Brain cancer Father 18   Prostate cancer Father 73   Skin cancer Father    Cancer Paternal Uncle        unsure type, possibly colon   Leukemia Cousin 9    Social History   Socioeconomic History   Marital status: Married    Spouse name: Not on file   Number of children: 2   Years of education: College   Highest education level: Not on file  Occupational History   Occupation: Unemployed  Tobacco Use   Smoking status: Former    Packs/day: 1.00    Years: 40.00    Total pack years: 40.00    Types: Cigarettes    Quit date: 11/2018    Years since quitting: 3.1   Smokeless tobacco: Never   Tobacco comments:    Previous 1 pack a day  Vaping Use   Vaping Use: Never used  Substance and Sexual Activity   Alcohol use: Not Currently   Drug use: No   Sexual activity: Yes    Partners: Male    Birth control/protection: Post-menopausal    Comment: 1st intercourse- 9, partners- more than 5  Other Topics Concern   Not on file  Social History Narrative   Lives at home w/ her husband   Right-handed   Caffeine: 2-3 cups per day   Social Determinants of Health   Financial Resource Strain: Not on file  Food Insecurity: Not on file  Transportation Needs: Not on file  Physical Activity: Not on file  Stress: Not on file  Social Connections: Not on file  Intimate Partner Violence: Not on file    Outpatient Medications Prior to Visit  Medication Sig Dispense Refill   aspirin 325 MG tablet Take 325 mg by mouth daily.     b complex vitamins capsule Take 1 capsule by mouth daily.     cholecalciferol (VITAMIN D) 1000 units tablet Take 1,000 Units by mouth every morning.      ciclopirox (LOPROX) 0.77 % SUSP Use on affected area bid 60 mL 2   Coenzyme Q10 (CO Q 10 PO) Take 1  capsule by mouth daily in the afternoon.     [employer] ESOMEPRAZOLE MAGNESIUM PO Take 1 capsule by mouth daily in the afternoon.     Melatonin 5 MG CHEW      pravastatin (PRAVACHOL) 40 MG tablet TAKE ONE TABLET BY MOUTH DAILY 90 tablet 1   Probiotic Product (ALIGN PO) Take by mouth.     Turmeric (QC TUMERIC COMPLEX PO) Take  1 capsule by mouth daily in the afternoon.     UNABLE TO FIND Med Name: esomeprazole     amLODipine (NORVASC) 5 MG tablet TAKE ONE TABLET BY MOUTH DAILY 90 tablet 0   desvenlafaxine (PRISTIQ) 50 MG 24 hr tablet TAKE ONE TABLET BY MOUTH DAILY 90 tablet 0   hydrochlorothiazide (HYDRODIURIL) 25 MG tablet TAKE ONE TABLET BY MOUTH DAILY 90 tablet 0   losartan (COZAAR) 100 MG tablet TAKE ONE TABLET BY MOUTH DAILY 90 tablet 0   metoprolol succinate (TOPROL-XL) 50 MG 24 hr tablet TAKE ONE TABLET BY MOUTH DAILY **TAKE WITH OR IMMEDIATLEY AFTER A MEAL** 90 tablet 1   buPROPion (WELLBUTRIN XL) 150 MG 24 hr tablet TAKE TWO TABLETS BY MOUTH DAILY 180 tablet 0   tizanidine (ZANAFLEX) 2 MG capsule Take 1 capsule (2 mg total) by mouth 3 (three) times daily as needed for muscle spasms. (Patient not taking: Reported on 01/08/2022) 30 capsule 1   No facility-administered medications prior to visit.    Allergies  Allergen Reactions   Augmentin  [Amoxicillin-Pot Clavulanate] Diarrhea and Nausea And Vomiting    Review of Systems  Constitutional:  Negative for fever and malaise/fatigue.  HENT:  Negative for congestion.   Eyes:  Negative for blurred vision.  Respiratory:  Negative for shortness of breath.   Cardiovascular:  Positive for leg swelling. Negative for chest pain and palpitations.  Gastrointestinal:  Negative for abdominal pain, blood in stool and nausea.  Genitourinary:  Negative for dysuria and frequency.  Musculoskeletal:  Negative for falls.       (+)knee pain  Skin:  Negative for rash.  Neurological:  Negative for dizziness, loss of consciousness and headaches.   Endo/Heme/Allergies:  Negative for environmental allergies.  Psychiatric/Behavioral:  Negative for depression. The patient is not nervous/anxious.        Objective:    Physical Exam Vitals and nursing note reviewed.  Constitutional:      General: She is not in acute distress.    Appearance: Normal appearance.  HENT:     Head: Normocephalic and atraumatic.     Right Ear: External ear normal.     Left Ear: External ear normal.  Eyes:     Extraocular Movements: Extraocular movements intact.     Pupils: Pupils are equal, round, and reactive to light.  Cardiovascular:     Rate and Rhythm: Normal rate and regular rhythm.     Heart sounds: Normal heart sounds. No murmur heard.    No gallop.  Pulmonary:     Effort: Pulmonary effort is normal. No respiratory distress.     Breath sounds: Normal breath sounds. No wheezing or rales.  Musculoskeletal:        General: Tenderness present. No swelling.  Skin:    General: Skin is warm and dry.  Neurological:     Mental Status: She is alert and oriented to person, place, and time.  Psychiatric:        Judgment: Judgment normal.     BP 128/78 (BP Location: Left Arm, Patient Position: Sitting, Cuff Size: Normal)   Pulse 82   Temp 98.5 F (36.9 C) (Oral)   Resp 16   Ht '5\' 3"'$  (1.6 m)   Wt 168 lb 9.6 oz (76.5 kg)   SpO2 96%   BMI 29.87 kg/m  Wt Readings from Last 3 Encounters:  01/16/22 168 lb 9.6 oz (76.5 kg)  01/08/22 165 lb 9.6 oz (75.1 kg)  12/30/21 168 lb 14 oz (76.6  kg)    Diabetic Foot Exam - Simple   No data filed    Lab Results  Component Value Date   WBC 8.2 12/30/2021   HGB 13.2 12/30/2021   HCT 38.8 12/30/2021   PLT 275 12/30/2021   GLUCOSE 141 (H) 12/30/2021   CHOL 203 (H) 07/07/2021   TRIG 68.0 07/07/2021   HDL 60.90 07/07/2021   LDLCALC 128 (H) 07/07/2021   ALT 16 07/07/2021   AST 11 07/07/2021   NA 138 12/30/2021   K 3.6 12/30/2021   CL 104 12/30/2021   CREATININE 0.72 12/30/2021   BUN 26 (H)  12/30/2021   CO2 27 12/30/2021   TSH 1.18 05/13/2020   INR 0.9 07/07/2021   HGBA1C 5.5 09/23/2016    Lab Results  Component Value Date   TSH 1.18 05/13/2020   Lab Results  Component Value Date   WBC 8.2 12/30/2021   HGB 13.2 12/30/2021   HCT 38.8 12/30/2021   MCV 91.9 12/30/2021   PLT 275 12/30/2021   Lab Results  Component Value Date   NA 138 12/30/2021   K 3.6 12/30/2021   CO2 27 12/30/2021   GLUCOSE 141 (H) 12/30/2021   BUN 26 (H) 12/30/2021   CREATININE 0.72 12/30/2021   BILITOT 0.5 07/07/2021   ALKPHOS 53 07/07/2021   AST 11 07/07/2021   ALT 16 07/07/2021   PROT 5.9 (L) 07/07/2021   ALBUMIN 4.3 07/07/2021   CALCIUM 8.6 (L) 12/30/2021   ANIONGAP 7 12/30/2021   GFR 92.26 07/07/2021   Lab Results  Component Value Date   CHOL 203 (H) 07/07/2021   Lab Results  Component Value Date   HDL 60.90 07/07/2021   Lab Results  Component Value Date   LDLCALC 128 (H) 07/07/2021   Lab Results  Component Value Date   TRIG 68.0 07/07/2021   Lab Results  Component Value Date   CHOLHDL 3 07/07/2021   Lab Results  Component Value Date   HGBA1C 5.5 09/23/2016       Assessment & Plan:   1. Essential hypertension Well controlled, no changes to meds. Encouraged heart healthy diet such as the DASH diet and exercise as tolerated.   - amLODipine (NORVASC) 5 MG tablet; Take 1 tablet (5 mg total) by mouth daily.  Dispense: 90 tablet; Refill: 1 - Lipid panel - Comprehensive metabolic panel - CBC with Differential/Platelet - ECHOCARDIOGRAM COMPLETE; Future - hydrochlorothiazide (HYDRODIURIL) 25 MG tablet; 1-2 po qd prn  Dispense: 180 tablet; Refill: 1 - losartan (COZAAR) 100 MG tablet; Take 1 tablet (100 mg total) by mouth daily.  Dispense: 90 tablet; Refill: 1 - metoprolol succinate (TOPROL-XL) 50 MG 24 hr tablet; TAKE ONE TABLET BY MOUTH DAILY **TAKE WITH OR IMMEDIATLEY AFTER A MEAL**  Dispense: 90 tablet; Refill: 1  2. Mixed hyperlipidemia Encourage heart healthy  diet such as MIND or DASH diet, increase exercise, avoid trans fats, simple carbohydrates and processed foods, consider a krill or fish or flaxseed oil cap daily.   - Lipid panel - Comprehensive metabolic panel - CBC with Differential/Platelet  3. Left leg pain  - tizanidine (ZANAFLEX) 2 MG capsule; Take 1 capsule (2 mg total) by mouth 3 (three) times daily as needed for muscle spasms.  Dispense: 30 capsule; Refill: 1  4. Primary osteoarthritis of right knee Per ortho - celecoxib (CELEBREX) 100 MG capsule; Take 1 capsule (100 mg total) by mouth 2 (two) times daily.  Dispense: 60 capsule; Refill: 3  5. Vitamin D deficiency   -  VITAMIN D 25 Hydroxy (Vit-D Deficiency, Fractures)  6. Localized swelling of both lower extremities   - ECHOCARDIOGRAM COMPLETE; Future  7. Depression with anxiety Stable  - desvenlafaxine (PRISTIQ) 50 MG 24 hr tablet; Take 1 tablet (50 mg total) by mouth daily.  Dispense: 90 tablet; Refill: 1  8. 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 Primary hypertension Well controlled, no changes to meds. Encouraged heart healthy diet such as the DASH diet and exercise as tolerated.    10. Hyperlipidemia LDL goal <100 Encourage heart healthy diet such as MIND or DASH diet, increase exercise, avoid trans fats, simple carbohydrates and processed foods, consider a krill or fish or flaxseed oil cap daily.    11. Acute right-sided low back pain without sciatica Per ortho   Meds ordered this encounter  Medications   DISCONTD: hydrochlorothiazide (HYDRODIURIL) 25 MG tablet    Sig: Take 1 tablet (25 mg total) by mouth daily.    Dispense:  90 tablet    Refill:  1   amLODipine (NORVASC) 5 MG tablet    Sig: Take 1 tablet (5 mg total) by mouth daily.    Dispense:  90 tablet    Refill:  1   DISCONTD: metoprolol succinate (TOPROL-XL) 50 MG 24 hr tablet    Sig: TAKE ONE TABLET BY MOUTH DAILY **TAKE WITH OR IMMEDIATLEY AFTER A MEAL**    Dispense:  90 tablet     Refill:  1   DISCONTD: losartan (COZAAR) 100 MG tablet    Sig: Take 1 tablet (100 mg total) by mouth daily.    Dispense:  90 tablet    Refill:  1   tizanidine (ZANAFLEX) 2 MG capsule    Sig: Take 1 capsule (2 mg total) by mouth 3 (three) times daily as needed for muscle spasms.    Dispense:  30 capsule    Refill:  1   celecoxib (CELEBREX) 100 MG capsule    Sig: Take 1 capsule (100 mg total) by mouth 2 (two) times daily.    Dispense:  60 capsule    Refill:  3   hydrochlorothiazide (HYDRODIURIL) 25 MG tablet    Sig: 1-2 po qd prn    Dispense:  180 tablet    Refill:  1   desvenlafaxine (PRISTIQ) 50 MG 24 hr tablet    Sig: Take 1 tablet (50 mg total) by mouth daily.    Dispense:  90 tablet    Refill:  1   losartan (COZAAR) 100 MG tablet    Sig: Take 1 tablet (100 mg total) by mouth daily.    Dispense:  90 tablet    Refill:  1   metoprolol succinate (TOPROL-XL) 50 MG 24 hr tablet    Sig: TAKE ONE TABLET BY MOUTH DAILY **TAKE WITH OR IMMEDIATLEY AFTER A MEAL**    Dispense:  90 tablet    Refill:  1    I, [name], DO, personally preformed the services described in this documentation.  All medical record entries made by the scribe were at my direction and in my presence.  I have reviewed the chart and discharge instructions (if applicable) and agree that the record reflects my personal performance and is accurate and complete. 01/16/2022   I,[name],acting as a scribe for [name], DO.,have documented all relevant documentation on the behalf of [name], DO,as directed by  [name], DO while in the presence of [name],  DO.   [name], DO

## 2022-01-16 NOTE — Assessment & Plan Note (Signed)
Surgery in Dec

## 2022-01-16 NOTE — Assessment & Plan Note (Signed)
Pt still sober

## 2022-01-16 NOTE — Assessment & Plan Note (Signed)
Encourage heart healthy diet such as MIND or DASH diet, increase exercise, avoid trans fats, simple carbohydrates and processed foods, consider a krill or fish or flaxseed oil cap daily.  °

## 2022-01-16 NOTE — Patient Instructions (Signed)

## 2022-01-17 ENCOUNTER — Encounter: Payer: Self-pay | Admitting: Physical Therapy

## 2022-01-17 ENCOUNTER — Ambulatory Visit: Payer: BC Managed Care – PPO | Attending: Specialist | Admitting: Physical Therapy

## 2022-01-17 DIAGNOSIS — G8929 Other chronic pain: Secondary | ICD-10-CM | POA: Diagnosis not present

## 2022-01-17 DIAGNOSIS — M25561 Pain in right knee: Secondary | ICD-10-CM | POA: Insufficient documentation

## 2022-01-17 DIAGNOSIS — R278 Other lack of coordination: Secondary | ICD-10-CM | POA: Diagnosis not present

## 2022-01-17 DIAGNOSIS — R293 Abnormal posture: Secondary | ICD-10-CM | POA: Diagnosis not present

## 2022-01-17 DIAGNOSIS — R262 Difficulty in walking, not elsewhere classified: Secondary | ICD-10-CM | POA: Diagnosis not present

## 2022-01-17 DIAGNOSIS — M6281 Muscle weakness (generalized): Secondary | ICD-10-CM | POA: Diagnosis not present

## 2022-01-17 DIAGNOSIS — M5442 Lumbago with sciatica, left side: Secondary | ICD-10-CM | POA: Diagnosis not present

## 2022-01-17 DIAGNOSIS — R600 Localized edema: Secondary | ICD-10-CM | POA: Diagnosis not present

## 2022-01-17 DIAGNOSIS — M5441 Lumbago with sciatica, right side: Secondary | ICD-10-CM | POA: Insufficient documentation

## 2022-01-17 LAB — LIPID PANEL
Cholesterol: 176 mg/dL (ref 0–200)
HDL: 52.9 mg/dL (ref >39.00)
LDL Cholesterol: 92 mg/dL (ref 0–99)
NonHDL: 123.04
Total CHOL/HDL Ratio: 3
Triglycerides: 153 mg/dL — ABNORMAL HIGH (ref 0.0–149.0)
VLDL: 30.6 mg/dL (ref 0.0–40.0)

## 2022-01-17 LAB — COMPREHENSIVE METABOLIC PANEL
ALT: 25 U/L (ref 0–35)
AST: 16 U/L (ref 0–37)
Albumin: 4.1 g/dL (ref 3.5–5.2)
Alkaline Phosphatase: 55 U/L (ref 39–117)
BUN: 24 mg/dL — ABNORMAL HIGH (ref 6–23)
CO2: 32 mEq/L (ref 19–32)
Calcium: 9.5 mg/dL (ref 8.4–10.5)
Chloride: 97 mEq/L (ref 96–112)
Creatinine, Ser: 0.7 mg/dL (ref 0.40–1.20)
GFR: 90.29 mL/min (ref >60.00)
Glucose, Bld: 82 mg/dL (ref 70–99)
Potassium: 3.8 mEq/L (ref 3.5–5.1)
Sodium: 138 mEq/L (ref 135–145)
Total Bilirubin: 0.5 mg/dL (ref 0.2–1.2)
Total Protein: 6.1 g/dL (ref 6.0–8.3)

## 2022-01-17 LAB — CBC WITH DIFFERENTIAL/PLATELET
Basophils Absolute: 0.1 10*3/uL (ref 0.0–0.1)
Basophils Relative: 1.3 % (ref 0.0–3.0)
Eosinophils Absolute: 0.3 10*3/uL (ref 0.0–0.7)
Eosinophils Relative: 4.3 % (ref 0.0–5.0)
HCT: 40.9 % (ref 36.0–46.0)
Hemoglobin: 13.8 g/dL (ref 12.0–15.0)
Lymphocytes Relative: 29.4 % (ref 12.0–46.0)
Lymphs Abs: 2.2 10*3/uL (ref 0.7–4.0)
MCHC: 33.8 g/dL (ref 30.0–36.0)
MCV: 92.7 fl (ref 78.0–100.0)
Monocytes Absolute: 0.8 10*3/uL (ref 0.1–1.0)
Monocytes Relative: 10.4 % (ref 3.0–12.0)
Neutro Abs: 4.1 10*3/uL (ref 1.4–7.7)
Neutrophils Relative %: 54.6 % (ref 43.0–77.0)
Platelets: 317 10*3/uL (ref 150.0–400.0)
RBC: 4.41 Mil/uL (ref 3.87–5.11)
RDW: 13.1 % (ref 11.5–15.5)
WBC: 7.5 10*3/uL (ref 4.0–10.5)

## 2022-01-17 LAB — VITAMIN D 25 HYDROXY (VIT D DEFICIENCY, FRACTURES): VITD: 51.2 ng/mL (ref 30.00–100.00)

## 2022-01-17 NOTE — Therapy (Signed)
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   Anxiety    Colon polyps    Depression    Endometrial cancer ([place])    Grade I   Endometrial polyp    Family history of brain cancer    Family history of prostate cancer    Former smoker 09/23/2016   GERD (gastroesophageal reflux disease)    Nexium   H/O clavicle fracture 2017   Right   High cholesterol    History of bronchitis    History of pneumonia    years ago   Hypertension    LVH (left ventricular hypertrophy) 09/23/2016   Mild, noted on ECHO   Numbness    left fingers after TIA   OA (osteoarthritis)    "right knee"   Osteoporosis 08/2017   T score -2.7   Panic attacks    Postmenopausal bleeding    Thyroid nodule 2019   Multiple   TIA (transient ischemic attack) 09/2016   Wears contact lenses    Past Surgical History:  Procedure Laterality Date   CLAVICLE SURGERY Right 2016   fracture repair   COLONOSCOPY  2017   multiples   CRANIOTOMY FOR ANEURYSM / VERTEBROBASILAR / CAROTID CIRCULATION  09/05/2021   DILATATION & CURETTAGE/HYSTEROSCOPY WITH MYOSURE N/A 05/26/2018   Procedure: DILATATION & CURETTAGE/HYSTEROSCOPY WITH MYOSURE;  Surgeon: Princess Bruins, MD;  Location: [place];  Service: Gynecology;  Laterality: N/A;   EYE SURGERY Right    "growth on eye removed"   FOOT SURGERY Right 08/2017   benign mass    HEMORRHOID SURGERY N/A 06/24/2018   Procedure: HEMORRHOIDECTOMY;  Surgeon: Doreen Garretson Boston, MD;  Location: [place];  Service: General;  Laterality: N/A;   KNEE ARTHROSCOPY Right    Menicus repair   ORIF CLAVICULAR FRACTURE Right 04/28/2015   Procedure: REVISION ORIF RIGHT CLAVICAL FRACTURE, ALLOGRAFT BONE GRAFTING;  Surgeon: Justice Britain, MD;  Location: [place];  Service: Orthopedics;  Laterality: Right;   Peridontal Laser Surgery  01/2019   due to infection, x 2   PLACEMENT OF BREAST IMPLANTS Bilateral    ROBOTIC ASSISTED TOTAL HYSTERECTOMY WITH BILATERAL SALPINGO OOPHERECTOMY Bilateral 07/10/2018   Procedure: XI ROBOTIC ASSISTED TOTAL HYSTERECTOMY WITH BILATERAL SALPINGO OOPHORECTOMY;  Surgeon: Everitt Amber, MD;  Location: [facility];  Service: Gynecology;  Laterality: Bilateral;   SENTINEL NODE BIOPSY N/A 07/10/2018   Procedure: SENTINEL NODE BIOPSY, LEFT PELVIC NODE;  Surgeon: Everitt Amber, MD;  Location[facility];  Service: Gynecology;  Laterality: N/A;   SPHINCTEROTOMY N/A 06/24/2018   Procedure: ANAL SPHINCTEROTOMY, ANORECTAL EXAM UNDER ANESTHESIA;  Surgeon: Devinne Epstein Boston, MD;  Location: [place];  Service: General;  Laterality: N/A;   TONSILLECTOMY     Patient Active Problem List  Diagnosis Date Noted   Brain aneurysm 07/28/2021   Preop examination 07/07/2021   Eczema 07/07/2021   Myalgia 02/14/2021   Left leg pain 12/28/2020   Bilateral hearing loss 05/07/2020   Depression, major, single episode, moderate ([place]) 05/07/2020   Acute right-sided low back pain without sciatica 09/14/2019   Foul smelling urine 09/14/2019   Family history of colon cancer 02/23/2019   Family history of prostate cancer    Family history of brain cancer    Rash 01/13/2019   Hemorrhoids 08/01/2018   Anal fissure s/p internal sphincterotomy 06/24/2018 06/24/2018   Prolapsed internal hemorrhoids, grade 2&3, s/p ligation/pexy/hemorrhoidectomy 06/24/2018 06/24/2018    Endometrial cancer ([place]) 06/05/2018   Rectal tear 03/24/2018   Preventative health care 08/09/2017   Depression with anxiety 08/09/2017   Hyperlipidemia LDL goal <100 08/09/2017   Seasonal allergies 08/09/2017   Osteoarthritis of knee 05/29/2017   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   Hx of anxiety disorder 04/03/2017   Smoking greater than 30 pack years 04/03/2017   Emphysema of lung ([place]) 04/03/2017   Arteriosclerotic cardiovascular disease (ASCVD) 04/03/2017   Alcoholism in remission ([place]) 04/02/2017   History of transient ischemic attack (TIA) 09/27/2016   Transient cerebral ischemia 09/27/2016   Gastroesophageal reflux disease 09/27/2016   Generalized anxiety disorder 09/27/2016   Gait disturbance 09/23/2016   Leukocytosis 09/23/2016   Primary hypertension 09/23/2016   HLD (hyperlipidemia) 09/23/2016   Former smoker 09/23/2016   Paresthesias 09/22/2016   SINUSITIS- ACUTE-NOS 05/12/2008    PCP: Roma Schanz  REFERRING PROVIDER: Susa Day, MD   REFERRING DIAG: 12/04/2021   Rationale for Evaluation and Treatment Rehabilitation  THERAPY DIAG:  Chronic pain of right knee  Muscle weakness (generalized)  Localized edema  Difficulty in walking, not elsewhere classified  Abnormal posture  Other lack of coordination  ONSET DATE: 12/04/2021   SUBJECTIVE:                                                                                                                                                                                           SUBJECTIVE STATEMENT: Patient has not been in to see PT in over a month, she reports that she had the series of knee injections and was in significant pain.  Worse pain in the back of the legs with sitting, c/o cramping.  PERTINENT HISTORY:  Brain aneurysm-clipped, OA R knee, scoliosis, leg length discrepancy, R shorter than L  PAIN:  Are you having pain? Yes: NPRS  scale: 6/10 Pain location: BLE Pain description: numbness and cramping in BLE Aggravating factors: sitting Relieving factors: Sit on  heating pad, compression socks   PRECAUTIONS: None  WEIGHT BEARING RESTRICTIONS No  FALLS:  Has patient fallen in last 6 months? 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  PLOF: Independent  PATIENT GOALS Manage pain   OBJECTIVE:   DIAGNOSTIC FINDINGS:  X rays reveal  SCREENING FOR RED FLAGS: Bowel or bladder incontinence: No Spinal tumors: No Cauda equina syndrome: No Compression fracture: No Abdominal aneurysm: No  COGNITION:  Overall cognitive status: Within functional limits for tasks assessed     SENSATION: Not tested  MUSCLE LENGTH: Hamstrings: Right 84 deg; Left 86 deg Thomas test: Hip flexors WNL, but quad tightness noted.  POSTURE: increased lumbar lordosis, anterior pelvic tilt, right pelvic obliquity, and weight shift left R knee genu varum in stance  PALPATION: R knee TTP around joint line  LUMBAR ROM:   Active  A/PROM  eval  Flexion To ankles  Extension WFL  Right lateral flexion Just above knee  Left lateral flexion Just above knee  Right rotation 70%  Left rotation 70%   (Blank rows = not tested)  LOWER EXTREMITY ROM:   B hip ROM mildly limited in IR/ER  Active  Right eval Left eval  Hip flexion    Hip extension    Hip abduction    Hip adduction    Hip internal rotation    Hip external rotation    Knee flexion 118 125  Knee extension    Ankle dorsiflexion    Ankle plantarflexion    Ankle inversion    Ankle eversion     (Blank rows = not tested)  LOWER EXTREMITY MMT:    MMT Right eval Left eval  Hip flexion 4 4  Hip extension 3 4-  Hip abduction 4 4   Hip adduction    Hip internal rotation    Hip external rotation    Knee flexion 3- 4+  Knee extension 4- 4+  Ankle dorsiflexion 4+ 4+  Ankle plantarflexion    Ankle inversion    Ankle eversion     (Blank rows = not tested)  LUMBAR SPECIAL TESTS:  Straight leg raise test: Positive and Slump test: Positive  FUNCTIONAL TESTS:  5 times sit to stand: 15.18  GAIT: Distance walked: 75' Assistive device utilized: None Level of assistance: Complete Independence Comments: Patient walks in flexed posture with R knee varus thrust in stance, lateral trunk flexion moment in R stance.    TODAY'S TREATMENT  01/17/22 Nustep level 4x 6 minutes Straight arm pulls 5# 2 x10 Feet on ball K2C, trunk rotation, small bridges, isometric abs Red tband clamshells PROM HS, Calves and piriformis  PATIENT EDUCATION:  Education details: POC, HEP Person educated: Patient Education method: Consulting civil engineer, Media planner, and Handouts Education comprehension: verbalized understanding and returned demonstration   HOME EXERCISE PROGRAM:  [id number]  ASSESSMENT:  CLINICAL IMPRESSION: Patient has not been in to PT in a month, reports that she has had scheduling conflicts reports that she has not done the HEP.  I gently started exercises for ROM of the knee and for core stability and LE flexibility, she tolerated this well.   OBJECTIVE IMPAIRMENTS Abnormal gait, decreased activity tolerance, decreased balance, decreased coordination, decreased endurance, decreased mobility, difficulty walking, decreased ROM, decreased strength, increased muscle spasms, impaired flexibility,  improper body mechanics, postural dysfunction, and pain.   ACTIVITY LIMITATIONS lifting, bending, squatting, sleeping, stairs, transfers, and locomotion level  PARTICIPATION LIMITATIONS: meal prep, cleaning, laundry, shopping, community activity, occupation, and yard work  PERSONAL FACTORS Past/current experiences and 1 comorbidity: R  knee degeneration  are also affecting patient's functional outcome.   REHAB POTENTIAL: Good  CLINICAL DECISION MAKING: Evolving/moderate complexity  EVALUATION COMPLEXITY: Moderate   GOALS: Goals reviewed with patient? Yes  SHORT TERM GOALS: Target date: 01/18/2022  I with initial HEP Baseline: Goal status: INITIAL  2.  Assess for potential benefit of a heel lift to level her pelvis during gait. Baseline: has small heel insert currently, but pelvic obliquity remains. Goal status: INITIAL  LONG TERM GOALS: Target date: 03/01/2022  I with final HEP Baseline:  Goal status: INITIAL  2. 5 X STS in < 12 seconds  Baseline: 15.18 Goal status: INITIAL  3.  Minimize compensatory movements in gait with strengthening, heel lift to level pelvis, possibly AD to decrease strain on R knee. Baseline: Severe R knee genu varum in stance, lateral trunk flexion in R stance, flexed posture, antalgic gait on R. Goal status: INITIAL  4.  Increase RLE strength by 1 muscle grade in weak muscle groups. Baseline:  Goal status: INITIAL  5.  Patient will report no muscle cramping in posterior legs in the evenings. Baseline: consistent cramps ans spasms Goal status: INITIAL  6.  Assess her work set up and her posture and support in her work chair, making appropriate adjustments as needed. Baseline:  Goal status: INITIAL   PLAN: PT FREQUENCY: 1-2x/week  PT DURATION: 10 weeks  PLANNED INTERVENTIONS: Therapeutic exercises, Therapeutic activity, Neuromuscular re-education, Balance training, Gait training, Patient/Family education, Self Care, Joint mobilization, Dry Needling, Electrical stimulation, Cryotherapy, Moist heat, Taping, Vasopneumatic device, Ultrasound, Ionotophoresis '4mg'$ /ml Dexamethasone, and Manual therapy.  PLAN FOR NEXT SESSION: slowly progress core strength, LE flexibility and knee ROM   Lum Babe, PT 01/17/2022, 5:48 PM

## 2022-01-19 ENCOUNTER — Ambulatory Visit: Payer: BC Managed Care – PPO | Admitting: Family Medicine

## 2022-01-24 ENCOUNTER — Ambulatory Visit (HOSPITAL_BASED_OUTPATIENT_CLINIC_OR_DEPARTMENT_OTHER): Payer: BC Managed Care – PPO

## 2022-01-25 ENCOUNTER — Ambulatory Visit: Payer: BC Managed Care – PPO | Admitting: Physical Therapy

## 2022-01-26 ENCOUNTER — Other Ambulatory Visit: Payer: Self-pay | Admitting: Family Medicine

## 2022-01-26 ENCOUNTER — Ambulatory Visit: Payer: BC Managed Care – PPO | Admitting: Physical Therapy

## 2022-01-26 DIAGNOSIS — M25561 Pain in right knee: Secondary | ICD-10-CM | POA: Diagnosis not present

## 2022-01-26 DIAGNOSIS — M6281 Muscle weakness (generalized): Secondary | ICD-10-CM

## 2022-01-26 DIAGNOSIS — R293 Abnormal posture: Secondary | ICD-10-CM

## 2022-01-26 DIAGNOSIS — R278 Other lack of coordination: Secondary | ICD-10-CM | POA: Diagnosis not present

## 2022-01-26 DIAGNOSIS — R262 Difficulty in walking, not elsewhere classified: Secondary | ICD-10-CM

## 2022-01-26 DIAGNOSIS — R600 Localized edema: Secondary | ICD-10-CM | POA: Diagnosis not present

## 2022-01-26 DIAGNOSIS — G8929 Other chronic pain: Secondary | ICD-10-CM

## 2022-01-26 DIAGNOSIS — M5441 Lumbago with sciatica, right side: Secondary | ICD-10-CM | POA: Diagnosis not present

## 2022-01-26 DIAGNOSIS — M5442 Lumbago with sciatica, left side: Secondary | ICD-10-CM | POA: Diagnosis not present

## 2022-01-26 NOTE — Therapy (Signed)
OUTPATIENT PHYSICAL THERAPY THORACOLUMBAR EVALUATION   Patient Name: Rebecca Gordon MRN: [id number] DOB:December 14, 1955, 66 y.o., female Today's Date: 01/26/2022   PT End of Session - 01/26/22 0801     Visit Number 3    Date for PT Re-Evaluation 03/01/22    PT Start Time 0757    PT Stop Time 0840    PT Time Calculation (min) 43 min    Activity Tolerance Patient tolerated treatment well;Patient limited by pain    Behavior During Therapy [facility] for tasks assessed/performed              Past Medical History:  Diagnosis Date   Alcoholism ([place])    Anal fissure    Aneurysm ([place]) 09/2016   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   Anxiety    Colon polyps    Depression    Endometrial cancer ([place])    Grade I   Endometrial polyp    Family history of brain cancer    Family history of prostate cancer    Former smoker 09/23/2016   GERD (gastroesophageal reflux disease)    Nexium   H/O clavicle fracture 2017   Right   High cholesterol    History of bronchitis    History of pneumonia    years ago   Hypertension    LVH (left ventricular hypertrophy) 09/23/2016   Mild, noted on ECHO   Numbness    left fingers after TIA   OA (osteoarthritis)    "right knee"   Osteoporosis 08/2017   T score -2.7   Panic attacks    Postmenopausal bleeding    Thyroid nodule 2019   Multiple   TIA (transient ischemic attack) 09/2016   Wears contact lenses    Past Surgical History:  Procedure Laterality Date   CLAVICLE SURGERY Right 2016   fracture repair   COLONOSCOPY  2017   multiples   CRANIOTOMY FOR ANEURYSM / VERTEBROBASILAR / CAROTID CIRCULATION  09/05/2021   DILATATION & CURETTAGE/HYSTEROSCOPY WITH MYOSURE N/A 05/26/2018   Procedure: DILATATION & CURETTAGE/HYSTEROSCOPY WITH MYOSURE;  Surgeon: Princess Bruins, MD;  Location: [place];  Service: Gynecology;  Laterality: N/A;   EYE SURGERY Right    "growth on eye removed"   FOOT SURGERY Right 08/2017   benign  mass   HEMORRHOID SURGERY N/A 06/24/2018   Procedure: HEMORRHOIDECTOMY;  Surgeon: Michael Boston, MD;  Location: [place];  Service: General;  Laterality: N/A;   KNEE ARTHROSCOPY Right    Menicus repair   ORIF CLAVICULAR FRACTURE Right 04/28/2015   Procedure: REVISION ORIF RIGHT CLAVICAL FRACTURE, ALLOGRAFT BONE GRAFTING;  Surgeon: Justice Britain, MD;  Location: [place];  Service: Orthopedics;  Laterality: Right;   Peridontal Laser Surgery  01/2019   due to infection, x 2   PLACEMENT OF BREAST IMPLANTS Bilateral    ROBOTIC ASSISTED TOTAL HYSTERECTOMY WITH BILATERAL SALPINGO OOPHERECTOMY Bilateral 07/10/2018   Procedure: XI ROBOTIC ASSISTED TOTAL HYSTERECTOMY WITH BILATERAL SALPINGO OOPHORECTOMY;  Surgeon: Everitt Amber, MD;  [facility];  Service: Gynecology;  Laterality: Bilateral;   SENTINEL NODE BIOPSY N/A 07/10/2018   Procedure: SENTINEL NODE BIOPSY, LEFT PELVIC NODE;  Surgeon: Everitt Amber, MD;  [facility];  Service: Gynecology;  Laterality: N/A;   SPHINCTEROTOMY N/A 06/24/2018   Procedure: ANAL SPHINCTEROTOMY, ANORECTAL EXAM UNDER ANESTHESIA;  Surgeon: Michael Boston, MD;  Location: [place];  Service: General;  Laterality: N/A;   TONSILLECTOMY     Patient Active Problem List  Diagnosis Date Noted   Brain aneurysm 07/28/2021   Preop examination 07/07/2021   Eczema 07/07/2021   Myalgia 02/14/2021   Left leg pain 12/28/2020   Bilateral hearing loss 05/07/2020   Depression, major, single episode, moderate ([place]) 05/07/2020   Acute right-sided low back pain without sciatica 09/14/2019   Foul smelling urine 09/14/2019   Family history of colon cancer 02/23/2019   Family history of prostate cancer    Family history of brain cancer    Rash 01/13/2019   Hemorrhoids 08/01/2018   Anal fissure s/p internal sphincterotomy 06/24/2018 06/24/2018   Prolapsed internal hemorrhoids, grade 2&3, s/p ligation/pexy/hemorrhoidectomy 06/24/2018 06/24/2018    Endometrial cancer ([place]) 06/05/2018   Rectal tear 03/24/2018   Preventative health care 08/09/2017   Depression with anxiety 08/09/2017   Hyperlipidemia LDL goal <100 08/09/2017   Seasonal allergies 08/09/2017   Osteoarthritis of knee 05/29/2017   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   Hx of anxiety disorder 04/03/2017   Smoking greater than 30 pack years 04/03/2017   Emphysema of lung ([place]) 04/03/2017   Arteriosclerotic cardiovascular disease (ASCVD) 04/03/2017   Alcoholism in remission ([place]) 04/02/2017   History of transient ischemic attack (TIA) 09/27/2016   Transient cerebral ischemia 09/27/2016   Gastroesophageal reflux disease 09/27/2016   Generalized anxiety disorder 09/27/2016   Gait disturbance 09/23/2016   Leukocytosis 09/23/2016   Primary hypertension 09/23/2016   HLD (hyperlipidemia) 09/23/2016   Former smoker 09/23/2016   Paresthesias 09/22/2016   SINUSITIS- ACUTE-NOS 05/12/2008    PCP: Roma Schanz  REFERRING PROVIDER: Susa Day, MD   REFERRING DIAG: 12/04/2021   Rationale for Evaluation and Treatment Rehabilitation  THERAPY DIAG:  Chronic pain of right knee  Muscle weakness (generalized)  Localized edema  Difficulty in walking, not elsewhere classified  Abnormal posture  Other lack of coordination  ONSET DATE: 12/04/2021   SUBJECTIVE:                                                                                                                                                                                           SUBJECTIVE STATEMENT: Patient has not been in to see PT in over a month, she reports that she had the series of knee injections and was in significant pain.  Worse pain in the back of the legs with sitting, c/o cramping.  PERTINENT HISTORY:  Brain aneurysm-clipped, OA R knee, scoliosis, leg length discrepancy, R shorter than L  PAIN:  Are you having pain? Yes: NPRS  scale: 6/10 Pain location: BLE Pain description: numbness and cramping in BLE Aggravating factors: sitting Relieving factors: Sit on  heating pad, compression socks   PRECAUTIONS: None  WEIGHT BEARING RESTRICTIONS No  FALLS:  Has patient fallen in last 6 months? 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  PLOF: Independent  PATIENT GOALS Manage pain   OBJECTIVE:   DIAGNOSTIC FINDINGS:  X rays reveal  SCREENING FOR RED FLAGS: Bowel or bladder incontinence: No Spinal tumors: No Cauda equina syndrome: No Compression fracture: No Abdominal aneurysm: No  COGNITION:  Overall cognitive status: Within functional limits for tasks assessed     SENSATION: Not tested  MUSCLE LENGTH: Hamstrings: Right 84 deg; Left 86 deg Thomas test: Hip flexors WNL, but quad tightness noted.  POSTURE: increased lumbar lordosis, anterior pelvic tilt, right pelvic obliquity, and weight shift left R knee genu varum in stance  PALPATION: R knee TTP around joint line  LUMBAR ROM:   Active  A/PROM  eval  Flexion To ankles  Extension WFL  Right lateral flexion Just above knee  Left lateral flexion Just above knee  Right rotation 70%  Left rotation 70%   (Blank rows = not tested)  LOWER EXTREMITY ROM:   B hip ROM mildly limited in IR/ER  Active  Right eval Left eval  Hip flexion    Hip extension    Hip abduction    Hip adduction    Hip internal rotation    Hip external rotation    Knee flexion 118 125  Knee extension    Ankle dorsiflexion    Ankle plantarflexion    Ankle inversion    Ankle eversion     (Blank rows = not tested)  LOWER EXTREMITY MMT:    MMT Right eval Left eval  Hip flexion 4 4  Hip extension 3 4-  Hip abduction 4 4   Hip adduction    Hip internal rotation    Hip external rotation    Knee flexion 3- 4+  Knee extension 4- 4+  Ankle dorsiflexion 4+ 4+  Ankle plantarflexion    Ankle inversion    Ankle eversion     (Blank rows = not tested)  LUMBAR SPECIAL TESTS:  Straight leg raise test: Positive and Slump test: Positive  FUNCTIONAL TESTS:  5 times sit to stand: 15.18  GAIT: Distance walked: 69' Assistive device utilized: None Level of assistance: Complete Independence Comments: Patient walks in flexed posture with R knee varus thrust in stance, lateral trunk flexion moment in R stance.    TODAY'S TREATMENT  01/26/22 NuStep L4 x 6 minutes Supine on physioball-bridging x 10 reps, repeat with hip IR x 10, bridge with ball roll side to side 3 x 5 reps Supine SLR with 2# x 10 reps, repeat with hip ER, no weight, 2 x 10 reps Sidelying hip abd with slow swing forward and back, 2 x 3 sets Quad sets Bridging x 10 Bridging walk outs x 4  01/17/22 Nustep level 4x 6 minutes Straight arm pulls 5# 2 x10 Feet on ball K2C, trunk rotation, small bridges, isometric abs Red tband clamshells PROM HS, Calves and piriformis  PATIENT EDUCATION:  Education details: POC, HEP Person educated: Patient Education method: Consulting civil engineer, Demonstration, and Handouts Education comprehension: verbalized understanding and returned demonstration   HOME EXERCISE PROGRAM:  [id number]  ASSESSMENT:  CLINICAL IMPRESSION: Patient reports no new  issues. Updated HEP and discussed ways for patient to work the exercises into her days.   OBJECTIVE IMPAIRMENTS Abnormal gait, decreased activity tolerance, decreased balance, decreased coordination, decreased endurance, decreased mobility, difficulty walking, decreased ROM, decreased strength, increased muscle spasms, impaired flexibility, improper body mechanics, postural dysfunction, and pain.   ACTIVITY LIMITATIONS lifting, bending, squatting, sleeping, stairs, transfers,  and locomotion level  PARTICIPATION LIMITATIONS: meal prep, cleaning, laundry, shopping, community activity, occupation, and yard work  PERSONAL FACTORS Past/current experiences and 1 comorbidity: R knee degeneration  are also affecting patient's functional outcome.   REHAB POTENTIAL: Good  CLINICAL DECISION MAKING: Evolving/moderate complexity  EVALUATION COMPLEXITY: Moderate   GOALS: Goals reviewed with patient? Yes  SHORT TERM GOALS: Target date: 01/18/2022  I with initial HEP Baseline: Goal status: ongoing  2.  Assess for potential benefit of a heel lift to level her pelvis during gait. Baseline: has small heel insert currently, but pelvic obliquity remains. Goal status:ongoing  LONG TERM GOALS: Target date: 03/01/2022  I with final HEP Baseline:  Goal status: INITIAL  2. 5 X STS in < 12 seconds  Baseline: 15.18 Goal status: INITIAL  3.  Minimize compensatory movements in gait with strengthening, heel lift to level pelvis, possibly AD to decrease strain on R knee. Baseline: Severe R knee genu varum in stance, lateral trunk flexion in R stance, flexed posture, antalgic gait on R. Goal status: ongoing  4.  Increase RLE strength by 1 muscle grade in weak muscle groups. Baseline:  Goal status: ongoing  5.  Patient will report no muscle cramping in posterior legs in the evenings. Baseline: consistent cramps ans spasms Goal status: INITIAL  6.  Assess her work set up and her posture and support in her work chair, making appropriate adjustments as needed. Baseline:  Goal status: INITIAL   PLAN: PT FREQUENCY: 1-2x/week  PT DURATION: 10 weeks  PLANNED INTERVENTIONS: Therapeutic exercises, Therapeutic activity, Neuromuscular re-education, Balance training, Gait training, Patient/Family education, Self Care, Joint mobilization, Dry Needling, Electrical stimulation, Cryotherapy, Moist heat, Taping, Vasopneumatic device, Ultrasound, Ionotophoresis '4mg'$ /ml Dexamethasone,  and Manual therapy.  PLAN FOR NEXT SESSION: slowly progress core strength, LE flexibility and knee ROM   Ethel Rana DPT 01/26/22 8:42 AM  01/26/2022, 8:42 AM

## 2022-02-01 ENCOUNTER — Ambulatory Visit: Payer: BC Managed Care – PPO | Admitting: Physical Therapy

## 2022-02-01 DIAGNOSIS — G8929 Other chronic pain: Secondary | ICD-10-CM | POA: Diagnosis not present

## 2022-02-01 DIAGNOSIS — R262 Difficulty in walking, not elsewhere classified: Secondary | ICD-10-CM | POA: Diagnosis not present

## 2022-02-01 DIAGNOSIS — R600 Localized edema: Secondary | ICD-10-CM | POA: Diagnosis not present

## 2022-02-01 DIAGNOSIS — M5442 Lumbago with sciatica, left side: Secondary | ICD-10-CM | POA: Diagnosis not present

## 2022-02-01 DIAGNOSIS — M25561 Pain in right knee: Secondary | ICD-10-CM | POA: Diagnosis not present

## 2022-02-01 DIAGNOSIS — R278 Other lack of coordination: Secondary | ICD-10-CM | POA: Diagnosis not present

## 2022-02-01 DIAGNOSIS — M6281 Muscle weakness (generalized): Secondary | ICD-10-CM | POA: Diagnosis not present

## 2022-02-01 DIAGNOSIS — R293 Abnormal posture: Secondary | ICD-10-CM | POA: Diagnosis not present

## 2022-02-01 DIAGNOSIS — M5441 Lumbago with sciatica, right side: Secondary | ICD-10-CM | POA: Diagnosis not present

## 2022-02-01 NOTE — Therapy (Signed)
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   Anxiety    Colon polyps    Depression    Endometrial cancer ([place])    Grade I   Endometrial polyp    Family history of brain cancer    Family history of prostate cancer    Former smoker 09/23/2016   GERD (gastroesophageal reflux disease)    Nexium   H/O clavicle fracture 2017   Right   High cholesterol    History of bronchitis    History of pneumonia    years ago   Hypertension    LVH (left ventricular hypertrophy) 09/23/2016   Mild, noted on ECHO   Numbness    left fingers after TIA   OA (osteoarthritis)    "right knee"   Osteoporosis 08/2017   T score -2.7   Panic attacks    Postmenopausal bleeding    Thyroid nodule 2019   Multiple   TIA (transient ischemic attack) 09/2016   Wears contact lenses    Past Surgical History:  Procedure Laterality Date   CLAVICLE SURGERY Right 2016   fracture repair   COLONOSCOPY  2017   multiples   CRANIOTOMY FOR ANEURYSM / VERTEBROBASILAR / CAROTID CIRCULATION  09/05/2021   DILATATION & CURETTAGE/HYSTEROSCOPY WITH MYOSURE N/A 05/26/2018   Procedure: DILATATION & CURETTAGE/HYSTEROSCOPY WITH MYOSURE;  Surgeon: Princess Bruins, MD;  Location: [place];  Service: Gynecology;  Laterality: N/A;   EYE SURGERY Right    "growth on eye removed"   FOOT SURGERY Right 08/2017   benign mass   HEMORRHOID SURGERY N/A 06/24/2018   Procedure: HEMORRHOIDECTOMY;  Surgeon: Michael Boston, MD;  Location: [place];  Service:  General;  Laterality: N/A;   KNEE ARTHROSCOPY Right    Menicus repair   ORIF CLAVICULAR FRACTURE Right 04/28/2015   Procedure: REVISION ORIF RIGHT CLAVICAL FRACTURE, ALLOGRAFT BONE GRAFTING;  Surgeon: Justice Britain, MD;  Location: [place];  Service: Orthopedics;  Laterality: Right;   Peridontal Laser Surgery  01/2019   due to infection, x 2   PLACEMENT OF BREAST IMPLANTS Bilateral    ROBOTIC ASSISTED TOTAL HYSTERECTOMY WITH BILATERAL SALPINGO OOPHERECTOMY Bilateral 07/10/2018   Procedure: XI ROBOTIC ASSISTED TOTAL HYSTERECTOMY WITH BILATERAL SALPINGO OOPHORECTOMY;  Surgeon: Everitt Amber, MD;  Location: [facility];  Service: Gynecology;  Laterality: Bilateral;   SENTINEL NODE BIOPSY N/A 07/10/2018   Procedure: SENTINEL NODE BIOPSY, LEFT PELVIC NODE;  Surgeon: Everitt Amber, MD;  Location: [facility];  Service: Gynecology;  Laterality: N/A;   SPHINCTEROTOMY N/A 06/24/2018   Procedure: ANAL SPHINCTEROTOMY, ANORECTAL EXAM UNDER ANESTHESIA;  Surgeon: Michael Boston, MD;  Location: [place];  Service: General;  Laterality: N/A;   TONSILLECTOMY     Patient Active Problem List   Diagnosis Date Noted   Brain aneurysm 07/28/2021   Preop examination 07/07/2021   Eczema 07/07/2021   Myalgia 02/14/2021  Left leg pain 12/28/2020   Bilateral hearing loss 05/07/2020   Depression, major, single episode, moderate ([place]) 05/07/2020   Acute right-sided low back pain without sciatica 09/14/2019   Foul smelling urine 09/14/2019   Family history of colon cancer 02/23/2019   Family history of prostate cancer    Family history of brain cancer    Rash 01/13/2019   Hemorrhoids 08/01/2018   Anal fissure s/p internal sphincterotomy 06/24/2018 06/24/2018   Prolapsed internal hemorrhoids, grade 2&3, s/p ligation/pexy/hemorrhoidectomy 06/24/2018 06/24/2018   Endometrial cancer ([place]) 06/05/2018   Rectal tear 03/24/2018   Preventative health care 08/09/2017   Depression with anxiety 08/09/2017   Hyperlipidemia  LDL goal <100 08/09/2017   Seasonal allergies 08/09/2017   Osteoarthritis of knee 05/29/2017   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/03/2017   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   Hx of anxiety disorder 04/03/2017   Smoking greater than 30 pack years 04/03/2017   Emphysema of lung ([place]) 04/03/2017   Arteriosclerotic cardiovascular disease (ASCVD) 04/03/2017   Alcoholism in remission ([place]) 04/02/2017   History of transient ischemic attack (TIA) 09/27/2016   Transient cerebral ischemia 09/27/2016   Gastroesophageal reflux disease 09/27/2016   Generalized anxiety disorder 09/27/2016   Gait disturbance 09/23/2016   Leukocytosis 09/23/2016   Primary hypertension 09/23/2016   HLD (hyperlipidemia) 09/23/2016   Former smoker 09/23/2016   Paresthesias 09/22/2016   SINUSITIS- ACUTE-NOS 05/12/2008    PCP: Roma Schanz  REFERRING PROVIDER: Susa Day, MD   REFERRING DIAG: 12/04/2021   Rationale for Evaluation and Treatment Rehabilitation  THERAPY DIAG:  Chronic pain of right knee  Muscle weakness (generalized)  ONSET DATE: 12/04/2021   SUBJECTIVE:                                                                                                                                                                                           SUBJECTIVE STATEMENT: Back is good, knee is bad PERTINENT HISTORY:  Brain aneurysm-clipped, OA R knee, scoliosis, leg length discrepancy, R shorter than L  PAIN:  Are you having pain? Yes: NPRS scale: 6/10 Pain location: BLE Pain description: numbness and cramping in BLE Aggravating factors: sitting Relieving factors: Sit on heating pad, compression socks   PRECAUTIONS: None  WEIGHT BEARING RESTRICTIONS No  FALLS:  Has patient fallen in last 6 months? 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  PLOF: Independent  PATIENT GOALS  Manage pain   OBJECTIVE:   DIAGNOSTIC FINDINGS:  X rays reveal  SCREENING FOR RED FLAGS: Bowel or bladder incontinence: No Spinal tumors: No Cauda equina syndrome: No Compression fracture: No Abdominal aneurysm: No  COGNITION:  Overall cognitive status: Within functional limits for tasks assessed     SENSATION: Not tested  MUSCLE LENGTH: Hamstrings: Right 84 deg; Left 86 deg Thomas test: Hip flexors WNL, but quad tightness noted.  POSTURE: increased lumbar lordosis, anterior pelvic tilt, right pelvic obliquity, and weight shift left R knee genu varum in stance  PALPATION: R knee TTP around joint line  LUMBAR ROM:   Active  A/PROM  eval  Flexion To ankles  Extension WFL  Right lateral flexion Just above knee  Left lateral flexion Just above knee  Right rotation 70%  Left rotation 70%   (Blank rows = not tested)  LOWER EXTREMITY ROM:   B hip ROM mildly limited in IR/ER  Active  Right eval Left eval  Hip flexion    Hip extension    Hip abduction    Hip adduction    Hip internal rotation    Hip external rotation    Knee flexion 118 125  Knee extension    Ankle dorsiflexion    Ankle plantarflexion    Ankle inversion    Ankle eversion     (Blank rows = not tested)  LOWER EXTREMITY MMT:    MMT Right eval Left eval  Hip flexion 4 4  Hip extension 3 4-  Hip abduction 4 4  Hip adduction    Hip internal rotation    Hip external rotation    Knee flexion 3- 4+  Knee extension 4- 4+  Ankle dorsiflexion 4+ 4+  Ankle plantarflexion    Ankle inversion    Ankle eversion     (Blank rows = not tested)  LUMBAR SPECIAL TESTS:  Straight leg raise test: Positive and Slump test: Positive  FUNCTIONAL TESTS:  5 times sit to stand: 15.18  GAIT: Distance walked: 23' Assistive device utilized: None Level of  assistance: Complete Independence Comments: Patient walks in flexed posture with R knee varus thrust in stance, lateral trunk flexion moment in R stance.    TODAY'S TREATMENT   02/01/22 Nustep L 5 6 min Feet on ball bridge BIL 15 x, SL 10 x each, obl 20 x Red tband 10 x BIl SLR, SLR with ER, SLR with abd Green tband clams and hip flexion 20 x each Seated row 20# 2 sets 10 Lat Pull 20 # 2 sets 10 Blakc tband trunk ext 2 sets 10 LAQ hold 3-5 sec 10 x educ to do at home   01/26/22 NuStep L4 x 6 minutes Supine on physioball-bridging x 10 reps, repeat with hip IR x 10, bridge with ball roll side to side 3 x 5 reps Supine SLR with 2# x 10 reps, repeat with hip ER, no weight, 2 x 10 reps Sidelying hip abd with slow swing forward and back, 2 x 3 sets Quad sets Bridging x 10 Bridging walk outs x 4  01/17/22 Nustep level 4x 6 minutes Straight arm pulls 5# 2 x10 Feet on ball K2C, trunk rotation, small bridges, isometric abs Red tband clamshells PROM HS, Calves and piriformis  PATIENT EDUCATION:  Education details: POC, HEP Person educated: Patient Education method: Consulting civil engineer, Demonstration, and Handouts Education comprehension: verbalized understanding and returned demonstration   HOME EXERCISE PROGRAM:  [id number]  ASSESSMENT:  CLINICAL IMPRESSION: Progressed strengthening and educ pt  on and answered a lot of questions re: upcoming TKR Added LAQ to HEP. Assess goals   OBJECTIVE IMPAIRMENTS Abnormal gait, decreased activity tolerance, decreased balance, decreased coordination, decreased endurance, decreased mobility, difficulty walking, decreased ROM, decreased strength, increased muscle spasms, impaired flexibility, improper body mechanics, postural dysfunction, and pain.   ACTIVITY LIMITATIONS lifting, bending, squatting, sleeping, stairs, transfers, and locomotion level  PARTICIPATION LIMITATIONS: meal prep, cleaning, laundry, shopping, community activity, occupation,  and yard work  PERSONAL FACTORS Past/current experiences and 1 comorbidity: R knee degeneration  are also affecting patient's functional outcome.   REHAB POTENTIAL: Good  CLINICAL DECISION MAKING: Evolving/moderate complexity  EVALUATION COMPLEXITY: Moderate   GOALS: Goals reviewed with patient? Yes  SHORT TERM GOALS: Target date: 01/18/2022  I with initial HEP Baseline: Goal status: met 02/01/22  2.  Assess for potential benefit of a heel lift to level her pelvis during gait. Baseline: has small heel insert currently, but pelvic obliquity remains. Goal status met 02/01/22  LONG TERM GOALS: Target date: 03/01/2022  I with final HEP Baseline:  Goal status: INITIAL  2. 5 X STS in < 12 seconds  Baseline: 15.18 Goal status: met 02/01/22  3.  Minimize compensatory movements in gait with strengthening, heel lift to level pelvis, possibly AD to decrease strain on R knee. Baseline: Severe R knee genu varum in stance, lateral trunk flexion in R stance, flexed posture, antalgic gait on R. Goal status: ongoing  4.  Increase RLE strength by 1 muscle grade in weak muscle groups. Baseline:  Goal status: ongoing  5.  Patient will report no muscle cramping in posterior legs in the evenings. Baseline: consistent cramps ans spasms Goal status: INITIAL  6.  Assess her work set up and her posture and support in her work chair, making appropriate adjustments as needed. Baseline:  Goal status: INITIAL   PLAN: PT FREQUENCY: 1-2x/week  PT DURATION: 10 weeks  PLANNED INTERVENTIONS: Therapeutic exercises, Therapeutic activity, Neuromuscular re-education, Balance training, Gait training, Patient/Family education, Self Care, Joint mobilization, Dry Needling, Electrical stimulation, Cryotherapy, Moist heat, Taping, Vasopneumatic device, Ultrasound, Ionotophoresis 16m/ml Dexamethasone, and Manual therapy.  PLAN FOR NEXT SESSION: slowly progress core strength, LE flexibility and knee  ROM   Angie Inman Fettig PTA 02/01/22 5:10 PM  02/01/2022, 5:10 PM [place] [address]Phone: [phone]  Fax:  [phone] Patient Details  Name: DSyana DegraffenreidMRN: [id number] of Birth: 8June 16, 1957Referring Provider:  LAnn Held *  Encounter Date: 02/01/2022   PLaqueta Carina PTA 02/01/2022, 5:09 PM  [place] [id number]: [phone]  Fax:  [phone]

## 2022-02-02 ENCOUNTER — Ambulatory Visit (HOSPITAL_BASED_OUTPATIENT_CLINIC_OR_DEPARTMENT_OTHER)
Admission: RE | Admit: 2022-02-02 | Discharge: 2022-02-02 | Disposition: A | Discharge status: Home / Self Care | Payer: BC Managed Care – PPO | Source: Ambulatory Visit | Attending: Family Medicine | Admitting: Family Medicine

## 2022-02-02 DIAGNOSIS — I10 Essential (primary) hypertension: Secondary | ICD-10-CM | POA: Insufficient documentation

## 2022-02-02 DIAGNOSIS — M7989 Other specified soft tissue disorders: Secondary | ICD-10-CM | POA: Insufficient documentation

## 2022-02-02 NOTE — Progress Notes (Signed)
  Echocardiogram 2D Echocardiogram has been performed.  Rebecca Gordon 02/02/2022, 8:05 AM

## 2022-02-04 LAB — ECHOCARDIOGRAM COMPLETE
AR max vel: 3.37 cm2
AV Area VTI: 3.84 cm2
AV Area mean vel: 3.68 cm2
AV Mean grad: 3 mmHg
AV Peak grad: 5.3 mmHg
Ao pk vel: 1.15 m/s
Area-P 1/2: 3.7 cm2
Calc EF: 59.9 %
S' Lateral: 2.9 cm
Single Plane A2C EF: 55.8 %
Single Plane A4C EF: 60.8 %

## 2022-02-06 ENCOUNTER — Telehealth: Payer: Self-pay | Admitting: *Deleted

## 2022-02-06 ENCOUNTER — Encounter: Payer: Self-pay | Admitting: Physical Therapy

## 2022-02-06 ENCOUNTER — Other Ambulatory Visit: Payer: Self-pay | Admitting: Obstetrics & Gynecology

## 2022-02-06 ENCOUNTER — Ambulatory Visit (INDEPENDENT_AMBULATORY_CARE_PROVIDER_SITE_OTHER): Payer: BC Managed Care – PPO

## 2022-02-06 ENCOUNTER — Ambulatory Visit: Payer: BC Managed Care – PPO | Admitting: Physical Therapy

## 2022-02-06 DIAGNOSIS — R262 Difficulty in walking, not elsewhere classified: Secondary | ICD-10-CM

## 2022-02-06 DIAGNOSIS — Z13820 Encounter for screening for osteoporosis: Secondary | ICD-10-CM

## 2022-02-06 DIAGNOSIS — M810 Age-related osteoporosis without current pathological fracture: Secondary | ICD-10-CM

## 2022-02-06 DIAGNOSIS — R600 Localized edema: Secondary | ICD-10-CM

## 2022-02-06 DIAGNOSIS — M5441 Lumbago with sciatica, right side: Secondary | ICD-10-CM | POA: Diagnosis not present

## 2022-02-06 DIAGNOSIS — M25561 Pain in right knee: Secondary | ICD-10-CM | POA: Diagnosis not present

## 2022-02-06 DIAGNOSIS — G8929 Other chronic pain: Secondary | ICD-10-CM | POA: Diagnosis not present

## 2022-02-06 DIAGNOSIS — R293 Abnormal posture: Secondary | ICD-10-CM

## 2022-02-06 DIAGNOSIS — Z780 Asymptomatic menopausal state: Secondary | ICD-10-CM

## 2022-02-06 DIAGNOSIS — R278 Other lack of coordination: Secondary | ICD-10-CM

## 2022-02-06 DIAGNOSIS — M6281 Muscle weakness (generalized): Secondary | ICD-10-CM

## 2022-02-06 DIAGNOSIS — M5442 Lumbago with sciatica, left side: Secondary | ICD-10-CM | POA: Diagnosis not present

## 2022-02-06 NOTE — Telephone Encounter (Signed)
Patient called to get results of echocardiogram.  Can you please review and advise.

## 2022-02-06 NOTE — Therapy (Signed)
OUTPATIENT PHYSICAL THERAPY THORACOLUMBAR   Patient Name: Rebecca Gordon MRN: [id number] DOB:05/20/1955, 66 y.o., female Today's Date: 02/06/2022   PT End of Session - 02/06/22 1748     Visit Number 5    Date for PT Re-Evaluation 03/01/22    PT Start Time 2633    PT Stop Time 3545    PT Time Calculation (min) 43 min    Activity Tolerance Patient tolerated treatment well;Patient limited by pain    Behavior During Therapy [facility] for tasks assessed/performed              Past Medical History:  Diagnosis Date   Alcoholism ([place])    Anal fissure    Aneurysm ([place]) 09/2016   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   Anxiety    Colon polyps    Depression    Endometrial cancer ([place])    Grade I   Endometrial polyp    Family history of brain cancer    Family history of prostate cancer    Former smoker 09/23/2016   GERD (gastroesophageal reflux disease)    Nexium   H/O clavicle fracture 2017   Right   High cholesterol    History of bronchitis    History of pneumonia    years ago   Hypertension    LVH (left ventricular hypertrophy) 09/23/2016   Mild, noted on ECHO   Numbness    left fingers after TIA   OA (osteoarthritis)    "right knee"   Osteoporosis 08/2017   T score -2.7   Panic attacks    Postmenopausal bleeding    Thyroid nodule 2019   Multiple   TIA (transient ischemic attack) 09/2016   Wears contact lenses    Past Surgical History:  Procedure Laterality Date   CLAVICLE SURGERY Right 2016   fracture repair   COLONOSCOPY  2017   multiples   CRANIOTOMY FOR ANEURYSM / VERTEBROBASILAR / CAROTID CIRCULATION  09/05/2021   DILATATION & CURETTAGE/HYSTEROSCOPY WITH MYOSURE N/A 05/26/2018   Procedure: DILATATION & CURETTAGE/HYSTEROSCOPY WITH MYOSURE;  Surgeon: Princess Bruins, MD;  Location: [place];  Service: Gynecology;  Laterality: N/A;   EYE SURGERY Right    "growth on eye removed"   FOOT SURGERY Right 08/2017   benign mass    HEMORRHOID SURGERY N/A 06/24/2018   Procedure: HEMORRHOIDECTOMY;  Surgeon: Edla Para Boston, MD;  Location: [place];  Service: General;  Laterality: N/A;   KNEE ARTHROSCOPY Right    Menicus repair   ORIF CLAVICULAR FRACTURE Right 04/28/2015   Procedure: REVISION ORIF RIGHT CLAVICAL FRACTURE, ALLOGRAFT BONE GRAFTING;  Surgeon: Justice Britain, MD;  Location: [place];  Service: Orthopedics;  Laterality: Right;   Peridontal Laser Surgery  01/2019   due to infection, x 2   PLACEMENT OF BREAST IMPLANTS Bilateral    ROBOTIC ASSISTED TOTAL HYSTERECTOMY WITH BILATERAL SALPINGO OOPHERECTOMY Bilateral 07/10/2018   Procedure: XI ROBOTIC ASSISTED TOTAL HYSTERECTOMY WITH BILATERAL SALPINGO OOPHORECTOMY;  Surgeon: Everitt Amber, MD;  Location: [facility];  Service: Gynecology;  Laterality: Bilateral;   SENTINEL NODE BIOPSY N/A 07/10/2018   Procedure: SENTINEL NODE BIOPSY, LEFT PELVIC NODE;  Surgeon: Everitt Amber, MD;  Location: [facility];  Service: Gynecology;  Laterality: N/A;   SPHINCTEROTOMY N/A 06/24/2018   Procedure: ANAL SPHINCTEROTOMY, ANORECTAL EXAM UNDER ANESTHESIA;  Surgeon: Erie Sica Boston, MD;  Location: [place];  Service: General;  Laterality: N/A;   TONSILLECTOMY     Patient Active Problem List  Diagnosis Date Noted   Brain aneurysm 07/28/2021   Preop examination 07/07/2021   Eczema 07/07/2021   Myalgia 02/14/2021   Left leg pain 12/28/2020   Bilateral hearing loss 05/07/2020   Depression, major, single episode, moderate ([place]) 05/07/2020   Acute right-sided low back pain without sciatica 09/14/2019   Foul smelling urine 09/14/2019   Family history of colon cancer 02/23/2019   Family history of prostate cancer    Family history of brain cancer    Rash 01/13/2019   Hemorrhoids 08/01/2018   Anal fissure s/p internal sphincterotomy 06/24/2018 06/24/2018   Prolapsed internal hemorrhoids, grade 2&3, s/p ligation/pexy/hemorrhoidectomy 06/24/2018 06/24/2018   Endometrial  cancer ([place]) 06/05/2018   Rectal tear 03/24/2018   Preventative health care 08/09/2017   Depression with anxiety 08/09/2017   Hyperlipidemia LDL goal <100 08/09/2017   Seasonal allergies 08/09/2017   Osteoarthritis of knee 05/29/2017   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[place]) 04/03/2017   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   Hx of anxiety disorder 04/03/2017   Smoking greater than 30 pack years 04/03/2017   Emphysema of lung ([place]) 04/03/2017   Arteriosclerotic cardiovascular disease (ASCVD) 04/03/2017   Alcoholism in remission ([place]) 04/02/2017   History of transient ischemic attack (TIA) 09/27/2016   Transient cerebral ischemia 09/27/2016   Gastroesophageal reflux disease 09/27/2016   Generalized anxiety disorder 09/27/2016   Gait disturbance 09/23/2016   Leukocytosis 09/23/2016   Primary hypertension 09/23/2016   HLD (hyperlipidemia) 09/23/2016   Former smoker 09/23/2016   Paresthesias 09/22/2016   SINUSITIS- ACUTE-NOS 05/12/2008    PCP: Roma Schanz  REFERRING PROVIDER: Susa Day, MD   REFERRING DIAG: 12/04/2021   Rationale for Evaluation and Treatment Rehabilitation  THERAPY DIAG:  Chronic pain of right knee  Muscle weakness (generalized)  Localized edema  Difficulty in walking, not elsewhere classified  Abnormal posture  Other lack of coordination  Acute bilateral low back pain with bilateral sciatica  ONSET DATE: 12/04/2021   SUBJECTIVE:                                                                                                                                                                                           SUBJECTIVE STATEMENT: Patient has scheduled a knee surgery for the end of December.  Mild twinges of back pain at times PERTINENT HISTORY:  Brain aneurysm-clipped, OA R knee, scoliosis, leg length discrepancy, R shorter than L  PAIN:  Are you having pain? Yes: NPRS scale: 6/10 Pain location: BLE Pain description:  numbness and cramping in BLE Aggravating factors: sitting Relieving factors: Sit on heating pad, compression socks   PRECAUTIONS: None  WEIGHT BEARING RESTRICTIONS No  FALLS:  Has patient fallen in last 6 months? 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  PLOF: Independent  PATIENT GOALS Manage pain   OBJECTIVE:   DIAGNOSTIC FINDINGS:  X rays reveal  SCREENING FOR RED FLAGS: Bowel or bladder incontinence: No Spinal tumors: No Cauda equina syndrome: No Compression fracture: No Abdominal aneurysm: No  COGNITION:  Overall cognitive status: Within functional limits for tasks assessed     SENSATION: Not tested  MUSCLE LENGTH: Hamstrings: Right 84 deg; Left 86 deg Thomas test: Hip flexors WNL, but quad tightness noted.  POSTURE: increased lumbar lordosis, anterior pelvic tilt, right pelvic obliquity, and weight shift left R knee genu varum in stance  PALPATION: R knee TTP around joint line  LUMBAR ROM:   Active  A/PROM  eval  Flexion To ankles  Extension WFL  Right lateral flexion Just above knee  Left lateral flexion Just above knee  Right rotation 70%  Left rotation 70%   (Blank rows = not tested)  LOWER EXTREMITY ROM:   B hip ROM mildly limited in IR/ER  Active  Right eval Left eval  Hip flexion    Hip extension    Hip abduction    Hip adduction    Hip internal rotation    Hip external rotation    Knee flexion 118 125  Knee extension    Ankle dorsiflexion    Ankle plantarflexion    Ankle inversion    Ankle eversion     (Blank rows = not tested)  LOWER EXTREMITY MMT:    MMT Right eval Left eval  Hip flexion 4 4  Hip extension 3 4-  Hip abduction 4 4  Hip adduction    Hip internal rotation    Hip  external rotation    Knee flexion 3- 4+  Knee extension 4- 4+  Ankle dorsiflexion 4+ 4+  Ankle plantarflexion    Ankle inversion    Ankle eversion     (Blank rows = not tested)  LUMBAR SPECIAL TESTS:  Straight leg raise test: Positive and Slump test: Positive  FUNCTIONAL TESTS:  5 times sit to stand: 15.18  GAIT: Distance walked: 36' Assistive device utilized: None Level of assistance: Complete Independence Comments: Patient walks in flexed posture with R knee varus thrust in stance, lateral trunk flexion moment in R stance.    TODAY'S TREATMENT  02/06/22 Bike level 3 x 6 minutes Leg curls 20# 2x12 Seated row 15# 2 x10 Lats 15# 2x10 SAQ 2.5# 3x10 cues for TKE Feet on ball K2C, trunk rotation, small bridges and isometric abs Bridges Green tband [employer] squeeze   02/01/22 Nustep L 5 6 min Feet on ball bridge BIL 15 x, SL 10 x each, obl 20 x Red tband 10 x BIl SLR, SLR with ER, SLR with abd Green tband clams and hip flexion 20 x each Seated row 20# 2 sets 10 Lat Pull 20 # 2 sets 10 Blakc tband trunk ext 2 sets 10 LAQ hold 3-5 sec 10 x educ to do at home   01/26/22 NuStep L4 x 6 minutes Supine on physioball-bridging x 10 reps, repeat with hip IR x 10, bridge with ball roll side to side 3 x 5 reps Supine SLR with 2# x 10 reps,  repeat with hip ER, no weight, 2 x 10 reps Sidelying hip abd with slow swing forward and back, 2 x 3 sets Quad sets Bridging x 10 Bridging walk outs x 4  01/17/22 Nustep level 4x 6 minutes Straight arm pulls 5# 2 x10 Feet on ball K2C, trunk rotation, small bridges, isometric abs Red tband clamshells PROM HS, Calves and piriformis  PATIENT EDUCATION:  Education details: POC, HEP Person educated: Patient Education method: Consulting civil engineer, Media planner, and Handouts Education comprehension: verbalized understanding and returned demonstration   HOME EXERCISE PROGRAM:  [id number]  ASSESSMENT:  CLINICAL IMPRESSION: Continued to  work on the core and the leg to prepare for good outcome of the TKA, she has a bad knee that has a lot of crepitus but the SAQ was good without pain Added LAQ to HEP. Assess goals   OBJECTIVE IMPAIRMENTS Abnormal gait, decreased activity tolerance, decreased balance, decreased coordination, decreased endurance, decreased mobility, difficulty walking, decreased ROM, decreased strength, increased muscle spasms, impaired flexibility, improper body mechanics, postural dysfunction, and pain.   ACTIVITY LIMITATIONS lifting, bending, squatting, sleeping, stairs, transfers, and locomotion level  PARTICIPATION LIMITATIONS: meal prep, cleaning, laundry, shopping, community activity, occupation, and yard work  PERSONAL FACTORS Past/current experiences and 1 comorbidity: R knee degeneration  are also affecting patient's functional outcome.   REHAB POTENTIAL: Good  CLINICAL DECISION MAKING: Evolving/moderate complexity  EVALUATION COMPLEXITY: Moderate   GOALS: Goals reviewed with patient? Yes  SHORT TERM GOALS: Target date: 01/18/2022  I with initial HEP Baseline: Goal status: met 02/01/22  2.  Assess for potential benefit of a heel lift to level her pelvis during gait. Baseline: has small heel insert currently, but pelvic obliquity remains. Goal status met 02/01/22  LONG TERM GOALS: Target date: 03/01/2022  I with final HEP Baseline:  Goal status: INITIAL  2. 5 X STS in < 12 seconds  Baseline: 15.18 Goal status: met 02/01/22  3.  Minimize compensatory movements in gait with strengthening, heel lift to level pelvis, possibly AD to decrease strain on R knee. Baseline: Severe R knee genu varum in stance, lateral trunk flexion in R stance, flexed posture, antalgic gait on R. Goal status: ongoing  4.  Increase RLE strength by 1 muscle grade in weak muscle groups. Baseline:  Goal status: ongoing  5.  Patient will report no muscle cramping in posterior legs in the evenings. Baseline:  consistent cramps ans spasms Goal status: INITIAL  6.  Assess her work set up and her posture and support in her work chair, making appropriate adjustments as needed. Baseline:  Goal status: INITIAL   PLAN: PT FREQUENCY: 1-2x/week  PT DURATION: 10 weeks  PLANNED INTERVENTIONS: Therapeutic exercises, Therapeutic activity, Neuromuscular re-education, Balance training, Gait training, Patient/Family education, Self Care, Joint mobilization, Dry Needling, Electrical stimulation, Cryotherapy, Moist heat, Taping, Vasopneumatic device, Ultrasound, Ionotophoresis 40m/ml Dexamethasone, and Manual therapy.  PLAN FOR NEXT SESSION: slowly progress core strength, LE flexibility and knee ROM  ASumner Boast PT 02/06/2022, 5:49 PM  [place] [id number]: [phone]  Fax:  [phone]

## 2022-02-07 NOTE — Telephone Encounter (Signed)
Patient wants to know what does she do with the normal ECHO , and wants to know if this has anything to do with her swelling of her ankles and wants to know next steps

## 2022-02-14 ENCOUNTER — Encounter: Payer: Self-pay | Admitting: Physical Therapy

## 2022-02-14 ENCOUNTER — Ambulatory Visit: Payer: BC Managed Care – PPO | Attending: Specialist | Admitting: Physical Therapy

## 2022-02-14 DIAGNOSIS — M5442 Lumbago with sciatica, left side: Secondary | ICD-10-CM | POA: Diagnosis not present

## 2022-02-14 DIAGNOSIS — M5441 Lumbago with sciatica, right side: Secondary | ICD-10-CM | POA: Insufficient documentation

## 2022-02-14 DIAGNOSIS — R293 Abnormal posture: Secondary | ICD-10-CM | POA: Diagnosis not present

## 2022-02-14 DIAGNOSIS — M25561 Pain in right knee: Secondary | ICD-10-CM | POA: Insufficient documentation

## 2022-02-14 DIAGNOSIS — R278 Other lack of coordination: Secondary | ICD-10-CM | POA: Insufficient documentation

## 2022-02-14 DIAGNOSIS — M6281 Muscle weakness (generalized): Secondary | ICD-10-CM | POA: Insufficient documentation

## 2022-02-14 DIAGNOSIS — R600 Localized edema: Secondary | ICD-10-CM | POA: Insufficient documentation

## 2022-02-14 DIAGNOSIS — R262 Difficulty in walking, not elsewhere classified: Secondary | ICD-10-CM | POA: Diagnosis not present

## 2022-02-14 DIAGNOSIS — G8929 Other chronic pain: Secondary | ICD-10-CM | POA: Insufficient documentation

## 2022-02-14 NOTE — Therapy (Signed)
OUTPATIENT PHYSICAL THERAPY THORACOLUMBAR   Patient Name: Rebecca Gordon MRN: [id number] DOB:14-Jan-1956, 66 y.o., female Today's Date: 02/14/2022   PT End of Session - 02/14/22 1741     Visit Number 6    Date for PT Re-Evaluation 03/01/22    PT Start Time 1740    PT Stop Time 9233    PT Time Calculation (min) 50 min    Activity Tolerance Patient tolerated treatment well;Patient limited by pain    Behavior During Therapy [facility] for tasks assessed/performed              Past Medical History:  Diagnosis Date   Alcoholism ([place])    Anal fissure    Aneurysm ([place]) 09/2016   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   Anxiety    Colon polyps    Depression    Endometrial cancer ([place])    Grade I   Endometrial polyp    Family history of brain cancer    Family history of prostate cancer    Former smoker 09/23/2016   GERD (gastroesophageal reflux disease)    Nexium   H/O clavicle fracture 2017   Right   High cholesterol    History of bronchitis    History of pneumonia    years ago   Hypertension    LVH (left ventricular hypertrophy) 09/23/2016   Mild, noted on ECHO   Numbness    left fingers after TIA   OA (osteoarthritis)    "right knee"   Osteoporosis 08/2017   T score -2.7   Panic attacks    Postmenopausal bleeding    Thyroid nodule 2019   Multiple   TIA (transient ischemic attack) 09/2016   Wears contact lenses    Past Surgical History:  Procedure Laterality Date   CLAVICLE SURGERY Right 2016   fracture repair   COLONOSCOPY  2017   multiples   CRANIOTOMY FOR ANEURYSM / VERTEBROBASILAR / CAROTID CIRCULATION  09/05/2021   DILATATION & CURETTAGE/HYSTEROSCOPY WITH MYOSURE N/A 05/26/2018   Procedure: DILATATION & CURETTAGE/HYSTEROSCOPY WITH MYOSURE;  Surgeon: Princess Bruins, MD;  Location: [place];  Service: Gynecology;  Laterality: N/A;   EYE SURGERY Right    "growth on eye removed"   FOOT SURGERY Right 08/2017   benign mass    HEMORRHOID SURGERY N/A 06/24/2018   Procedure: HEMORRHOIDECTOMY;  Surgeon: Naven Giambalvo Boston, MD;  Location: [place];  Service: General;  Laterality: N/A;   KNEE ARTHROSCOPY Right    Menicus repair   ORIF CLAVICULAR FRACTURE Right 04/28/2015   Procedure: REVISION ORIF RIGHT CLAVICAL FRACTURE, ALLOGRAFT BONE GRAFTING;  Surgeon: Justice Britain, MD;  Location: [place];  Service: Orthopedics;  Laterality: Right;   Peridontal Laser Surgery  01/2019   due to infection, x 2   PLACEMENT OF BREAST IMPLANTS Bilateral    ROBOTIC ASSISTED TOTAL HYSTERECTOMY WITH BILATERAL SALPINGO OOPHERECTOMY Bilateral 07/10/2018   Procedure: XI ROBOTIC ASSISTED TOTAL HYSTERECTOMY WITH BILATERAL SALPINGO OOPHORECTOMY;  Surgeon: Everitt Amber, MD;  Location: [facility];  Service: Gynecology;  Laterality: Bilateral;   SENTINEL NODE BIOPSY N/A 07/10/2018   Procedure: SENTINEL NODE BIOPSY, LEFT PELVIC NODE;  Surgeon: Everitt Amber, MD;  Location: [facility];  Service: Gynecology;  Laterality: N/A;   SPHINCTEROTOMY N/A 06/24/2018   Procedure: ANAL SPHINCTEROTOMY, ANORECTAL EXAM UNDER ANESTHESIA;  Surgeon: Faruq Rosenberger Boston, MD;  Location: [place];  Service: General;  Laterality: N/A;   TONSILLECTOMY     Patient Active Problem List  Diagnosis Date Noted   Brain aneurysm 07/28/2021   Preop examination 07/07/2021   Eczema 07/07/2021   Myalgia 02/14/2021   Left leg pain 12/28/2020   Bilateral hearing loss 05/07/2020   Depression, major, single episode, moderate ([place]) 05/07/2020   Acute right-sided low back pain without sciatica 09/14/2019   Foul smelling urine 09/14/2019   Family history of colon cancer 02/23/2019   Family history of prostate cancer    Family history of brain cancer    Rash 01/13/2019   Hemorrhoids 08/01/2018   Anal fissure s/p internal sphincterotomy 06/24/2018 06/24/2018   Prolapsed internal hemorrhoids, grade 2&3, s/p ligation/pexy/hemorrhoidectomy 06/24/2018 06/24/2018   Endometrial  cancer ([place]) 06/05/2018   Rectal tear 03/24/2018   Preventative health care 08/09/2017   Depression with anxiety 08/09/2017   Hyperlipidemia LDL goal <100 08/09/2017   Seasonal allergies 08/09/2017   Osteoarthritis of knee 05/29/2017   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[place]) 04/03/2017   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   Hx of anxiety disorder 04/03/2017   Smoking greater than 30 pack years 04/03/2017   Emphysema of lung ([place]) 04/03/2017   Arteriosclerotic cardiovascular disease (ASCVD) 04/03/2017   Alcoholism in remission ([place]) 04/02/2017   History of transient ischemic attack (TIA) 09/27/2016   Transient cerebral ischemia 09/27/2016   Gastroesophageal reflux disease 09/27/2016   Generalized anxiety disorder 09/27/2016   Gait disturbance 09/23/2016   Leukocytosis 09/23/2016   Primary hypertension 09/23/2016   HLD (hyperlipidemia) 09/23/2016   Former smoker 09/23/2016   Paresthesias 09/22/2016   SINUSITIS- ACUTE-NOS 05/12/2008    PCP: Roma Schanz  REFERRING PROVIDER: Susa Day, MD   REFERRING DIAG: 12/04/2021   Rationale for Evaluation and Treatment Rehabilitation  THERAPY DIAG:  Chronic pain of right knee  Muscle weakness (generalized)  Localized edema  Difficulty in walking, not elsewhere classified  Abnormal posture  Other lack of coordination  Acute bilateral low back pain with bilateral sciatica  ONSET DATE: 12/04/2021   SUBJECTIVE:                                                                                                                                                                                           SUBJECTIVE STATEMENT: Patient has scheduled a knee surgery for the end of December.  REally can't tolerate much due to the knee pain, does have some back pain. PERTINENT HISTORY:  Brain aneurysm-clipped, OA R knee, scoliosis, leg length discrepancy, R shorter than L  PAIN:  Are you having pain? Yes: NPRS scale: 0/10 Pain  location: BLE Pain description: numbness and cramping in BLE Aggravating factors: sitting walking every step right leg pain up to 8/10 Relieving  factors: Sit on heating pad, compression socks   PRECAUTIONS: None  WEIGHT BEARING RESTRICTIONS No  FALLS:  Has patient fallen in last 6 months? 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  PLOF: Independent  PATIENT GOALS Manage pain   OBJECTIVE:   DIAGNOSTIC FINDINGS:  X rays reveal  SCREENING FOR RED FLAGS: Bowel or bladder incontinence: No Spinal tumors: No Cauda equina syndrome: No Compression fracture: No Abdominal aneurysm: No  COGNITION:  Overall cognitive status: Within functional limits for tasks assessed     SENSATION: Not tested  MUSCLE LENGTH: Hamstrings: Right 84 deg; Left 86 deg Thomas test: Hip flexors WNL, but quad tightness noted.  POSTURE: increased lumbar lordosis, anterior pelvic tilt, right pelvic obliquity, and weight shift left R knee genu varum in stance  PALPATION: R knee TTP around joint line  LUMBAR ROM:   Active  A/PROM  eval  Flexion To ankles  Extension WFL  Right lateral flexion Just above knee  Left lateral flexion Just above knee  Right rotation 70%  Left rotation 70%   (Blank rows = not tested)  LOWER EXTREMITY ROM:   B hip ROM mildly limited in IR/ER  Active  Right eval Left eval  Hip flexion    Hip extension    Hip abduction    Hip adduction    Hip internal rotation    Hip external rotation    Knee flexion 118 125  Knee extension    Ankle dorsiflexion    Ankle plantarflexion    Ankle inversion    Ankle eversion     (Blank rows = not tested)  LOWER EXTREMITY MMT:    MMT Right eval Left eval  Hip flexion 4 4  Hip  extension 3 4-  Hip abduction 4 4  Hip adduction    Hip internal rotation    Hip external rotation    Knee flexion 3- 4+  Knee extension 4- 4+  Ankle dorsiflexion 4+ 4+  Ankle plantarflexion    Ankle inversion    Ankle eversion     (Blank rows = not tested)  LUMBAR SPECIAL TESTS:  Straight leg raise test: Positive and Slump test: Positive  FUNCTIONAL TESTS:  5 times sit to stand: 15.18  GAIT: Distance walked: 96' Assistive device utilized: None Level of assistance: Complete Independence Comments: Patient walks in flexed posture with R knee varus thrust in stance, lateral trunk flexion moment in R stance.    TODAY'S TREATMENT  02/14/22 Nustep level 5 x 6 minutes Bike Level 3 x 6 minutes Seated row 20# 2x10 Lats 20# 2x10 Black tband lumbar extension 2x10 HS curls 20# 2x10 Ball squeeze bridge Red tband clamshell bridges Dead bug with ball Talked with her about transfers from a low toilet seat   02/06/22 Bike level 3 x 6 minutes Leg curls 20# 2x12 Seated row 15# 2 x10 Lats 15# 2x10 SAQ 2.5# 3x10 cues for TKE Feet on ball K2C, trunk rotation, small bridges and isometric abs Bridges Green tband [employer] squeeze   02/01/22 Nustep L 5 6 min Feet on ball bridge BIL 15 x, SL 10 x each, obl 20 x Red tband 10 x BIl SLR, SLR with ER, SLR with abd Green tband clams and hip flexion 20 x each  Seated row 20# 2 sets 10 Lat Pull 20 # 2 sets 10 Blakc tband trunk ext 2 sets 10 LAQ hold 3-5 sec 10 x educ to do at home   01/26/22 NuStep L4 x 6 minutes Supine on physioball-bridging x 10 reps, repeat with hip IR x 10, bridge with ball roll side to side 3 x 5 reps Supine SLR with 2# x 10 reps, repeat with hip ER, no weight, 2 x 10 reps Sidelying hip abd with slow swing forward and back, 2 x 3 sets Quad sets Bridging x 10 Bridging walk outs x 4  01/17/22 Nustep level 4x 6 minutes Straight arm pulls 5# 2 x10 Feet on ball K2C, trunk rotation, small bridges,  isometric abs Red tband clamshells PROM HS, Calves and piriformis  PATIENT EDUCATION:  Education details: POC, HEP Person educated: Patient Education method: Consulting civil engineer, Demonstration, and Handouts Education comprehension: verbalized understanding and returned demonstration   HOME EXERCISE PROGRAM:  [id number]  ASSESSMENT:  CLINICAL IMPRESSION: We continue to work on core, leg and overall strength as well as started problem solving for after TKA transfers and walking etc...  She is incorporating what we are doing to some of her HEP which is great.  The knee is truly limiting due to pain with weight bearing type activity so trying to strengthen without knee pain   OBJECTIVE IMPAIRMENTS Abnormal gait, decreased activity tolerance, decreased balance, decreased coordination, decreased endurance, decreased mobility, difficulty walking, decreased ROM, decreased strength, increased muscle spasms, impaired flexibility, improper body mechanics, postural dysfunction, and pain.   ACTIVITY LIMITATIONS lifting, bending, squatting, sleeping, stairs, transfers, and locomotion level  PARTICIPATION LIMITATIONS: meal prep, cleaning, laundry, shopping, community activity, occupation, and yard work  PERSONAL FACTORS Past/current experiences and 1 comorbidity: R knee degeneration  are also affecting patient's functional outcome.   REHAB POTENTIAL: Good  CLINICAL DECISION MAKING: Evolving/moderate complexity  EVALUATION COMPLEXITY: Moderate   GOALS: Goals reviewed with patient? Yes  SHORT TERM GOALS: Target date: 01/18/2022  I with initial HEP Baseline: Goal status: met 02/01/22  2.  Assess for potential benefit of a heel lift to level her pelvis during gait. Baseline: has small heel insert currently, but pelvic obliquity remains. Goal status met 02/01/22  LONG TERM GOALS: Target date: 03/01/2022  I with final HEP Baseline:  Goal status:ongoing  2. 5 X STS in < 12 seconds  Baseline:  15.18 Goal status: met 02/01/22  3.  Minimize compensatory movements in gait with strengthening, heel lift to level pelvis, possibly AD to decrease strain on R knee. Baseline: Severe R knee genu varum in stance, lateral trunk flexion in R stance, flexed posture, antalgic gait on R. Goal status: ongoing  4.  Increase RLE strength by 1 muscle grade in weak muscle groups. Baseline:  Goal status: ongoing  5.  Patient will report no muscle cramping in posterior legs in the evenings. Baseline: consistent cramps ans spasms Goal status:partially met  6.  Assess her work set up and her posture and support in her work chair, making appropriate adjustments as needed. Baseline:  Goal status: partially met   PLAN: PT FREQUENCY: 1-2x/week  PT DURATION: 10 weeks  PLANNED INTERVENTIONS: Therapeutic exercises, Therapeutic activity, Neuromuscular re-education, Balance training, Gait training, Patient/Family education, Self Care, Joint mobilization, Dry Needling, Electrical stimulation, Cryotherapy, Moist heat, Taping, Vasopneumatic device, Ultrasound, Ionotophoresis 54m/ml Dexamethasone, and Manual therapy.  PLAN FOR NEXT SESSION: problem solve her stairs after surgery and how to go up and down a few steps into the  home with walker, transfers from low seat  Sumner Boast, PT 02/14/2022, 5:46 PM  [place] [place], [place], [address] Phone: [phone]   Fax:  [phone] Health

## 2022-02-15 DIAGNOSIS — I671 Cerebral aneurysm, nonruptured: Secondary | ICD-10-CM | POA: Diagnosis not present

## 2022-02-15 DIAGNOSIS — R600 Localized edema: Secondary | ICD-10-CM | POA: Diagnosis not present

## 2022-02-15 DIAGNOSIS — I10 Essential (primary) hypertension: Secondary | ICD-10-CM | POA: Diagnosis not present

## 2022-02-15 DIAGNOSIS — E782 Mixed hyperlipidemia: Secondary | ICD-10-CM | POA: Diagnosis not present

## 2022-02-20 ENCOUNTER — Ambulatory Visit: Payer: BC Managed Care – PPO | Admitting: Physical Therapy

## 2022-02-23 ENCOUNTER — Ambulatory Visit: Payer: BC Managed Care – PPO | Admitting: Physical Therapy

## 2022-02-26 ENCOUNTER — Ambulatory Visit: Payer: BC Managed Care – PPO | Admitting: Physical Therapy

## 2022-02-26 DIAGNOSIS — M6281 Muscle weakness (generalized): Secondary | ICD-10-CM

## 2022-02-26 DIAGNOSIS — R293 Abnormal posture: Secondary | ICD-10-CM

## 2022-02-26 DIAGNOSIS — R278 Other lack of coordination: Secondary | ICD-10-CM

## 2022-02-26 DIAGNOSIS — G8929 Other chronic pain: Secondary | ICD-10-CM

## 2022-02-26 DIAGNOSIS — R262 Difficulty in walking, not elsewhere classified: Secondary | ICD-10-CM

## 2022-02-26 DIAGNOSIS — R600 Localized edema: Secondary | ICD-10-CM

## 2022-02-26 DIAGNOSIS — M5442 Lumbago with sciatica, left side: Secondary | ICD-10-CM

## 2022-02-26 NOTE — Therapy (Signed)
OUTPATIENT PHYSICAL THERAPY THORACOLUMBAR   Patient Name: Rebecca Gordon MRN: [id number] DOB:[birth year], 66 y.o., female Today's Date: 02/26/2022   PT End of Session - 02/26/22 1802     Visit Number 7    Date for PT Re-Evaluation 03/01/22    PT Start Time 0712    PT Stop Time 1840    PT Time Calculation (min) 45 min    Activity Tolerance Patient tolerated treatment well;Patient limited by pain    Behavior During Therapy [facility] for tasks assessed/performed           PHYSICAL THERAPY DISCHARGE SUMMARY  Visits from Start of Care: 7  Current functional level related to goals / functional outcomes: Goals partially met.   Remaining deficits: Severe R knee OA, scheduled for TKR 12/27.   Education / Equipment: HEP   Patient agrees to discharge. Patient goals were partially met. Patient is being discharged due to maximized rehab potential.      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   Anxiety    Colon polyps    Depression    Endometrial cancer (HCC)    Grade I   Endometrial polyp    Family history of brain cancer    Family history of prostate cancer    Former smoker 09/23/2016   GERD (gastroesophageal reflux disease)    Nexium   H/O clavicle fracture 2017   Right   High cholesterol    History of bronchitis    History of pneumonia    years ago   Hypertension    LVH (left ventricular hypertrophy) 09/23/2016   Mild, noted on ECHO   Numbness    left fingers after TIA   OA (osteoarthritis)    "right knee"   Osteoporosis 08/2017   T score -2.7   Panic attacks    Postmenopausal bleeding    Thyroid nodule 2019   Multiple   TIA (transient ischemic attack) 09/2016   Wears contact lenses    Past Surgical History:  Procedure Laterality Date   CLAVICLE SURGERY Right 2016   fracture repair   COLONOSCOPY  2017   multiples   CRANIOTOMY FOR ANEURYSM /  VERTEBROBASILAR / CAROTID CIRCULATION  09/05/2021   DILATATION & CURETTAGE/HYSTEROSCOPY WITH MYOSURE N/A 05/26/2018   Procedure: DILATATION & CURETTAGE/HYSTEROSCOPY WITH MYOSURE;  Surgeon: Princess Bruins, MD;  Location: [place];  Service: Gynecology;  Laterality: N/A;   EYE SURGERY Right    "growth on eye removed"   FOOT SURGERY Right 08/2017   benign mass   HEMORRHOID SURGERY N/A 06/24/2018   Procedure: HEMORRHOIDECTOMY;  Surgeon: Michael Boston, MD;  Location: [place];  Service: General;  Laterality: N/A;   KNEE ARTHROSCOPY Right    Menicus repair   ORIF CLAVICULAR FRACTURE Right 04/28/2015   Procedure: REVISION ORIF RIGHT CLAVICAL FRACTURE, ALLOGRAFT BONE GRAFTING;  Surgeon: Justice Britain, MD;  Location: [place];  Service: Orthopedics;  Laterality: Right;   Peridontal Laser Surgery  01/2019   due to infection, x 2   PLACEMENT OF BREAST IMPLANTS Bilateral    ROBOTIC ASSISTED TOTAL HYSTERECTOMY WITH BILATERAL SALPINGO OOPHERECTOMY Bilateral 07/10/2018   Procedure: XI ROBOTIC ASSISTED TOTAL HYSTERECTOMY WITH BILATERAL SALPINGO OOPHORECTOMY;  Surgeon: Everitt Amber, MD;  [facility];  Service: Gynecology;  Laterality: Bilateral;   SENTINEL NODE BIOPSY N/A 07/10/2018   Procedure: [employer]  NODE BIOPSY, LEFT PELVIC NODE;  Surgeon: Everitt Amber, MD;  Location[facility];  Service: Gynecology;  Laterality: N/A;   SPHINCTEROTOMY N/A 06/24/2018   Procedure: ANAL SPHINCTEROTOMY, ANORECTAL EXAM UNDER ANESTHESIA;  Surgeon: Michael Boston, MD;  Location: [place];  Service: General;  Laterality: N/A;   TONSILLECTOMY     Patient Active Problem List   Diagnosis Date Noted   Brain aneurysm 07/28/2021   Preop examination 07/07/2021   Eczema 07/07/2021   Myalgia 02/14/2021   Left leg pain 12/28/2020   Bilateral hearing loss 05/07/2020   Depression, major, single episode, moderate ([place]) 05/07/2020   Acute right-sided low back pain without sciatica  09/14/2019   Foul smelling urine 09/14/2019   Family history of colon cancer 02/23/2019   Family history of prostate cancer    Family history of brain cancer    Rash 01/13/2019   Hemorrhoids 08/01/2018   Anal fissure s/p internal sphincterotomy 06/24/2018 06/24/2018   Prolapsed internal hemorrhoids, grade 2&3, s/p ligation/pexy/hemorrhoidectomy 06/24/2018 06/24/2018   Endometrial cancer ([place]) 06/05/2018   Rectal tear 03/24/2018   Preventative health care 08/09/2017   Depression with anxiety 08/09/2017   Hyperlipidemia LDL goal <100 08/09/2017   Seasonal allergies 08/09/2017   Osteoarthritis of knee 05/29/2017   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   Hx of anxiety disorder 04/03/2017   Smoking greater than 30 pack years 04/03/2017   Emphysema of lung ([place]) 04/03/2017   Arteriosclerotic cardiovascular disease (ASCVD) 04/03/2017   Alcoholism in remission ([place]) 04/02/2017   History of transient ischemic attack (TIA) 09/27/2016   Transient cerebral ischemia 09/27/2016   Gastroesophageal reflux disease 09/27/2016   Generalized anxiety disorder 09/27/2016   Gait disturbance 09/23/2016   Leukocytosis 09/23/2016   Primary hypertension 09/23/2016   HLD (hyperlipidemia) 09/23/2016   Former smoker 09/23/2016   Paresthesias 09/22/2016   SINUSITIS- ACUTE-NOS 05/12/2008    PCP: Roma Schanz  REFERRING PROVIDER: Susa Day, MD   REFERRING DIAG: 12/04/2021   Rationale for Evaluation and Treatment Rehabilitation  THERAPY DIAG:  Chronic pain of right knee  Muscle weakness (generalized)  Localized edema  Other lack of coordination  Abnormal posture  Difficulty in walking, not elsewhere classified  Acute bilateral low back pain with bilateral sciatica  ONSET DATE: 12/04/2021   SUBJECTIVE:                                                                                                                                                                                            SUBJECTIVE STATEMENT: Patient reports she is riding her bike at home. The knee is  still bothersome.  PERTINENT HISTORY:  Brain aneurysm-clipped, OA R knee, scoliosis, leg length discrepancy, R shorter than L  PAIN:  Are you having pain? Yes: NPRS scale: 0/10 Pain location: BLE Pain description: numbness and cramping in BLE Aggravating factors: sitting walking every step right leg pain up to 8/10 Relieving factors: Sit on heating pad, compression socks   PRECAUTIONS: None  WEIGHT BEARING RESTRICTIONS No  FALLS:  Has patient fallen in last 6 months? 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  PLOF: Independent  PATIENT GOALS Manage pain   OBJECTIVE:   DIAGNOSTIC FINDINGS:  X rays reveal  SCREENING FOR RED FLAGS: Bowel or bladder incontinence: No Spinal tumors: No Cauda equina syndrome: No Compression fracture: No Abdominal aneurysm: No  COGNITION:  Overall cognitive status: Within functional limits for tasks assessed     SENSATION: Not tested  MUSCLE LENGTH: Hamstrings: Right 84 deg; Left 86 deg Thomas test: Hip flexors WNL, but quad tightness noted.  POSTURE: increased lumbar lordosis, anterior pelvic tilt, right pelvic obliquity, and weight shift left R knee genu varum in stance  PALPATION: R knee TTP around joint line  LUMBAR ROM:   Active  A/PROM  eval  Flexion To ankles  Extension WFL  Right lateral flexion Just above knee  Left lateral flexion Just above knee  Right rotation 70%  Left rotation 70%   (Blank rows = not tested)  LOWER EXTREMITY ROM:   B hip ROM mildly limited in IR/ER  Active  Right eval Left eval  Hip flexion    Hip extension     Hip abduction    Hip adduction    Hip internal rotation    Hip external rotation    Knee flexion 118 125  Knee extension    Ankle dorsiflexion    Ankle plantarflexion    Ankle inversion    Ankle eversion     (Blank rows = not tested)  LOWER EXTREMITY MMT:    MMT Right eval Left eval  Hip flexion 4 4  Hip extension 3 4-  Hip abduction 4 4  Hip adduction    Hip internal rotation    Hip external rotation    Knee flexion 3- 4+  Knee extension 4- 4+  Ankle dorsiflexion 4+ 4+  Ankle plantarflexion    Ankle inversion    Ankle eversion     (Blank rows = not tested)  LUMBAR SPECIAL TESTS:  Straight leg raise test: Positive and Slump test: Positive  FUNCTIONAL TESTS:  5 times sit to stand: 15.18  GAIT: Distance walked: 71' Assistive device utilized: None Level of assistance: Complete Independence Comments: Patient walks in flexed posture with R knee varus thrust in stance, lateral trunk flexion moment in R stance.    TODAY'S TREATMENT  02/26/22 NuStep L5 x 6 minutes Supine exercises bridge, bridge over ball, bridge and roll ball, bridge with IR, dead bug with ball, abdominal squeeze  02/14/22 Nustep level 5 x 6 minutes Bike Level 3 x 6 minutes Seated row 20# 2x10 Lats 20# 2x10 Black tband lumbar extension 2x10 HS curls 20# 2x10 Ball squeeze bridge Red tband clamshell bridges Dead bug with ball Talked with her about transfers from a low toilet seat   02/06/22 Bike level 3 x 6  minutes Leg curls 20# 2x12 Seated row 15# 2 x10 Lats 15# 2x10 SAQ 2.5# 3x10 cues for TKE Feet on ball K2C, trunk rotation, small bridges and isometric abs Bridges Green tband [employer] squeeze   02/01/22 Nustep L 5 6 min Feet on ball bridge BIL 15 x, SL 10 x each, obl 20 x Red tband 10 x BIl SLR, SLR with ER, SLR with abd Green tband clams and hip flexion 20 x each Seated row 20# 2 sets 10 Lat Pull 20 # 2 sets 10 Blakc tband trunk ext 2 sets 10 LAQ hold 3-5 sec 10 x  educ to do at home   01/26/22 NuStep L4 x 6 minutes Supine on physioball-bridging x 10 reps, repeat with hip IR x 10, bridge with ball roll side to side 3 x 5 reps Supine SLR with 2# x 10 reps, repeat with hip ER, no weight, 2 x 10 reps Sidelying hip abd with slow swing forward and back, 2 x 3 sets Quad sets Bridging x 10 Bridging walk outs x 4  01/17/22 Nustep level 4x 6 minutes Straight arm pulls 5# 2 x10 Feet on ball K2C, trunk rotation, small bridges, isometric abs Red tband clamshells PROM HS, Calves and piriformis  PATIENT EDUCATION:  Education details: POC, HEP Person educated: Patient Education method: Consulting civil engineer, Demonstration, and Handouts Education comprehension: verbalized understanding and returned demonstration   HOME EXERCISE PROGRAM:  [id number]  ASSESSMENT:  CLINICAL IMPRESSION: Patient reports that she is performing HEP. Updated to include physioball exercises as she has one at home. Functional status re-assessed, goals partially met. Some will not be met until after her R TKR on 12/27.   OBJECTIVE IMPAIRMENTS Abnormal gait, decreased activity tolerance, decreased balance, decreased coordination, decreased endurance, decreased mobility, difficulty walking, decreased ROM, decreased strength, increased muscle spasms, impaired flexibility, improper body mechanics, postural dysfunction, and pain.   ACTIVITY LIMITATIONS lifting, bending, squatting, sleeping, stairs, transfers, and locomotion level  PARTICIPATION LIMITATIONS: meal prep, cleaning, laundry, shopping, community activity, occupation, and yard work  PERSONAL FACTORS Past/current experiences and 1 comorbidity: R knee degeneration  are also affecting patient's functional outcome.   REHAB POTENTIAL: Good  CLINICAL DECISION MAKING: Evolving/moderate complexity  EVALUATION COMPLEXITY: Moderate   GOALS: Goals reviewed with patient? Yes  SHORT TERM GOALS: Target date: 01/18/2022  I with initial  HEP Baseline: Goal status: met 02/01/22  2.  Assess for potential benefit of a heel lift to level her pelvis during gait. Baseline: has small heel insert currently, but pelvic obliquity remains. Goal status met 02/01/22  LONG TERM GOALS: Target date: 03/01/2022  I with final HEP Baseline:  Goal status:met  2. 5 X STS in < 12 seconds  Baseline: 15.18 Goal status: met 02/01/22  3.  Minimize compensatory movements in gait with strengthening, heel lift to level pelvis, possibly AD to decrease strain on R knee. Baseline: Severe R knee genu varum in stance, lateral trunk flexion in R stance, flexed posture, antalgic gait on R. Goal status: partially met, TKR scheduled for 04/11/22  4.  Increase RLE strength by 1 muscle grade in weak muscle groups. Baseline:  Goal status: not met  5.  Patient will report no muscle cramping in posterior legs in the evenings. Baseline: consistent cramps and spasms Goal status:partially met  6.  Assess her work set up and her posture and support in her work chair, making appropriate adjustments as needed. Baseline:  Goal status:  met   PLAN: PT FREQUENCY: 1-2x/week  PT DURATION:  10 weeks  PLANNED INTERVENTIONS: Therapeutic exercises, Therapeutic activity, Neuromuscular re-education, Balance training, Gait training, Patient/Family education, Self Care, Joint mobilization, Dry Needling, Electrical stimulation, Cryotherapy, Moist heat, Taping, Vasopneumatic device, Ultrasound, Ionotophoresis 18m/ml Dexamethasone, and Manual therapy.  PLAN FOR NEXT SESSION: problem solve her stairs after surgery and how to go up and down a few steps into the home with walker, transfers from low seat  SMarcelina Morel DPT 02/26/2022, 6:42 PM  [place] [address]Phone: [phone]  Fax:  [phone]

## 2022-02-26 NOTE — Telephone Encounter (Signed)
Just wanted to notate as patient said she did not want to resched

## 2022-02-27 ENCOUNTER — Ambulatory Visit: Payer: BC Managed Care – PPO | Admitting: Physical Therapy

## 2022-03-02 ENCOUNTER — Ambulatory Visit: Payer: BC Managed Care – PPO | Admitting: Family Medicine

## 2022-03-06 ENCOUNTER — Ambulatory Visit: Payer: BC Managed Care – PPO | Admitting: Family Medicine

## 2022-03-22 DIAGNOSIS — K219 Gastro-esophageal reflux disease without esophagitis: Secondary | ICD-10-CM | POA: Diagnosis not present

## 2022-03-22 DIAGNOSIS — I671 Cerebral aneurysm, nonruptured: Secondary | ICD-10-CM | POA: Diagnosis not present

## 2022-03-22 DIAGNOSIS — I10 Essential (primary) hypertension: Secondary | ICD-10-CM | POA: Diagnosis not present

## 2022-03-22 DIAGNOSIS — R7301 Impaired fasting glucose: Secondary | ICD-10-CM | POA: Diagnosis not present

## 2022-03-22 DIAGNOSIS — E782 Mixed hyperlipidemia: Secondary | ICD-10-CM | POA: Diagnosis not present

## 2022-04-11 DIAGNOSIS — M1711 Unilateral primary osteoarthritis, right knee: Secondary | ICD-10-CM | POA: Diagnosis not present

## 2022-04-11 HISTORY — PX: OTHER SURGICAL HISTORY: SHX169

## 2022-04-12 NOTE — Therapy (Signed)
OUTPATIENT PHYSICAL THERAPY LOWER EXTREMITY EVALUATION   Patient Name: Rebecca Gordon MRN: [id number] DOB:1956-02-27, 66 y.o., female Today's Date: 04/13/2022  END OF SESSION:  PT End of Session - 04/13/22 1207     Visit Number 1    Date for PT Re-Evaluation 07/06/22    PT Start Time 1010    PT Stop Time 1054    PT Time Calculation (min) 44 min    Activity Tolerance Patient tolerated treatment well;Patient limited by pain    Behavior During Therapy [facility] for tasks assessed/performed             Past Medical History:  Diagnosis Date   Alcoholism ([place])    Anal fissure    Aneurysm ([place]) 09/2016   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   Anxiety    Colon polyps    Depression    Endometrial cancer ([place])    Grade I   Endometrial polyp    Family history of brain cancer    Family history of prostate cancer    Former smoker 09/23/2016   GERD (gastroesophageal reflux disease)    Nexium   H/O clavicle fracture 2017   Right   High cholesterol    History of bronchitis    History of pneumonia    years ago   Hypertension    LVH (left ventricular hypertrophy) 09/23/2016   Mild, noted on ECHO   Numbness    left fingers after TIA   OA (osteoarthritis)    "right knee"   Osteoporosis 08/2017   T score -2.7   Panic attacks    Postmenopausal bleeding    Thyroid nodule 2019   Multiple   TIA (transient ischemic attack) 09/2016   Wears contact lenses    Past Surgical History:  Procedure Laterality Date   CLAVICLE SURGERY Right 2016   fracture repair   COLONOSCOPY  2017   multiples   CRANIOTOMY FOR ANEURYSM / VERTEBROBASILAR / CAROTID CIRCULATION  09/05/2021   DILATATION & CURETTAGE/HYSTEROSCOPY WITH MYOSURE N/A 05/26/2018   Procedure: DILATATION & CURETTAGE/HYSTEROSCOPY WITH MYOSURE;  Surgeon: Princess Bruins, MD;  Location: [place];  Service: Gynecology;  Laterality: N/A;   EYE SURGERY Right    "growth on eye removed"   FOOT SURGERY Right  08/2017   benign mass   HEMORRHOID SURGERY N/A 06/24/2018   Procedure: HEMORRHOIDECTOMY;  Surgeon: Michael Boston, MD;  Location: [facility];  Service: General;  Laterality: N/A;   KNEE ARTHROSCOPY Right    Menicus repair   ORIF CLAVICULAR FRACTURE Right 04/28/2015   Procedure: REVISION ORIF RIGHT CLAVICAL FRACTURE, ALLOGRAFT BONE GRAFTING;  Surgeon: Justice Britain, MD;  Location: [facility];  Service: Orthopedics;  Laterality: Right;   Peridontal Laser Surgery  01/2019   due to infection, x 2   PLACEMENT OF BREAST IMPLANTS Bilateral    ROBOTIC ASSISTED TOTAL HYSTERECTOMY WITH BILATERAL SALPINGO OOPHERECTOMY Bilateral 07/10/2018   Procedure: XI ROBOTIC ASSISTED TOTAL HYSTERECTOMY WITH BILATERAL SALPINGO OOPHORECTOMY;  Surgeon: Everitt Amber, MD;  [facility];  Service: Gynecology;  Laterality: Bilateral;   SENTINEL NODE BIOPSY N/A 07/10/2018   Procedure: SENTINEL NODE BIOPSY, LEFT PELVIC NODE;  Surgeon: Everitt Amber, MD;  [facility];  Service: Gynecology;  Laterality: N/A;   SPHINCTEROTOMY N/A 06/24/2018   Procedure: ANAL SPHINCTEROTOMY, ANORECTAL EXAM UNDER ANESTHESIA;  Surgeon: Michael Boston, MD;  Location: [facility];  Service: General;  Laterality: N/A;   TONSILLECTOMY     Patient Active  Problem List   Diagnosis Date Noted   Brain aneurysm 07/28/2021   Preop examination 07/07/2021   Eczema 07/07/2021   Myalgia 02/14/2021   Left leg pain 12/28/2020   Bilateral hearing loss 05/07/2020   Depression, major, single episode, moderate ([place]) 05/07/2020   Acute right-sided low back pain without sciatica 09/14/2019   Foul smelling urine 09/14/2019   Family history of colon cancer 02/23/2019   Family history of prostate cancer    Family history of brain cancer    Rash 01/13/2019   Hemorrhoids 08/01/2018   Anal fissure s/p internal sphincterotomy 06/24/2018 06/24/2018   Prolapsed internal hemorrhoids, grade 2&3, s/p ligation/pexy/hemorrhoidectomy 06/24/2018  06/24/2018   Endometrial cancer ([place]) 06/05/2018   Rectal tear 03/24/2018   Preventative health care 08/09/2017   Depression with anxiety 08/09/2017   Hyperlipidemia LDL goal <100 08/09/2017   Seasonal allergies 08/09/2017   Osteoarthritis of knee 05/29/2017   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   Hx of anxiety disorder 04/03/2017   Smoking greater than 30 pack years 04/03/2017   Emphysema of lung ([place]) 04/03/2017   Arteriosclerotic cardiovascular disease (ASCVD) 04/03/2017   Alcoholism in remission ([place]) 04/02/2017   History of transient ischemic attack (TIA) 09/27/2016   Transient cerebral ischemia 09/27/2016   Gastroesophageal reflux disease 09/27/2016   Generalized anxiety disorder 09/27/2016   Gait disturbance 09/23/2016   Leukocytosis 09/23/2016   Primary hypertension 09/23/2016   HLD (hyperlipidemia) 09/23/2016   Former smoker 09/23/2016   Paresthesias 09/22/2016   SINUSITIS- ACUTE-NOS 05/12/2008    PCP: [name], DO  REFERRING PROVIDER: Gaynelle Arabian, MD  REFERRING DIAG:  Diagnosis  [id number] (ICD-10-CM) - Presence of right artificial knee joint    THERAPY DIAG:  Chronic pain of right knee  Muscle weakness (generalized)  Localized edema  Other lack of coordination  Abnormal posture  Difficulty in walking, not elsewhere classified  Acute pain of right knee  Rationale for Evaluation and Treatment: Rehabilitation  ONSET DATE: 04/11/22  SUBJECTIVE:   SUBJECTIVE STATEMENT: Patient with questions about showering, recommend seat with armrest. Had difficulty negotiating steps.  PERTINENT HISTORY: S/P R TKR Brain aneurysm-clipped, OA R knee, scoliosis, leg length discrepancy, R shorter than L  PAIN:  Are you having pain? Yes: NPRS scale: 9/10 Pain location: R knee Pain description: sharp Aggravating factors: stiffness Relieving factors: Medication , ice  PRECAUTIONS:  None  WEIGHT BEARING RESTRICTIONS: No  FALLS:  Has patient fallen in last 6 months? 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   PLOF: Independent  PATIENT GOALS: Recover fully, walk.  NEXT MD VISIT: 3 weeks  OBJECTIVE:   DIAGNOSTIC FINDINGS: N/A   COGNITION: Overall cognitive status: Within functional limits for tasks assessed     SENSATION: Not tested  EDEMA:  Expected swelling   POSTURE: weight shift left  PALPATION: R thigh was severely indurated above the ace wrap, painful to touch.  LOWER EXTREMITY ROM: WNL except where noted.  Passive ROM Right eval Left eval  Hip flexion    Hip extension    Hip abduction    Hip adduction    Hip internal rotation    Hip external rotation    Knee flexion 85   Knee extension 27   Ankle  dorsiflexion    Ankle plantarflexion    Ankle inversion    Ankle eversion      LOWER EXTREMITY MMT: LLE 5  MMT Right eval Left eval  Hip flexion 3-   Hip extension    Hip abduction 3-   Hip adduction    Hip internal rotation    Hip external rotation    Knee flexion 2+   Knee extension 3-   Ankle dorsiflexion 4   Ankle plantarflexion    Ankle inversion    Ankle eversion      GAIT: Distance walked: In clinic distances Assistive device utilized: Walker - 2 wheeled Level of assistance: Modified independence Comments: Patient reports difficulty managing her 2 steps. Educated to correct technique, she has a handle to hold onto.Walked in with max reliance on BUE support, step to gait pattern with RLE slightly ER throughout gait cycle.   TODAY'S TREATMENT:                                                                                                                              DATE:  04/13/22   Education Gait training-initiated step through gait training, with increased WB through RLE in cycle, improved with practice. Quad sets, SAQ, HS, SLR, seated long kicks and knee flexion R hip IR in supine. Educated to prevent passive hip ER when lying down.  PATIENT EDUCATION:  Education details: HEP, POC Person educated: Patient and Spouse Education method: Explanation, Demonstration, and Handouts Education comprehension: verbalized understanding and returned demonstration  HOME EXERCISE PROGRAM: [id number]  ASSESSMENT:  CLINICAL IMPRESSION: Patient is a 66 y.o. who was seen today for physical therapy evaluation and treatment for S/P R TKR. She presents with severe pain and stiffness. R thigh induration noted above the ace bandage, very tight and painful, but responded to gentle STM and activation with slightly decreased tension and pain. Her knee is tight in ext. She had not started using her foot cradle, but will begin today. Weakness noted in RLE as anticipated. Initiated HEP and gait training with RW to promote normalized gait. She will benefit from continued to PT to appropriately progress her through recovery.  OBJECTIVE IMPAIRMENTS: Abnormal gait, decreased activity tolerance, decreased balance, decreased coordination, decreased mobility, difficulty walking, decreased ROM, decreased strength, increased muscle spasms, impaired flexibility, postural dysfunction, and pain.   ACTIVITY LIMITATIONS: carrying, lifting, bending, sitting, standing, squatting, sleeping, stairs, transfers, bathing, toileting, and locomotion level  PARTICIPATION LIMITATIONS: meal prep, cleaning, laundry, driving, shopping, community activity, and occupation  PERSONAL FACTORS: Age and Past/current experiences are also affecting patient's functional outcome.   REHAB POTENTIAL: Good  CLINICAL DECISION MAKING: Evolving/moderate complexity  EVALUATION COMPLEXITY: Moderate   GOALS: Goals reviewed with  patient? Yes  SHORT TERM GOALS: Target date: 05/03/22 I with initial HEP Baseline: Goal status: INITIAL  LONG TERM GOALS: Target date: 07/06/22  I with final HEP Baseline:  Goal status: INITIAL  2.  Increase r knee ROM to <  5-120 Baseline:  Goal status: INITIAL  3.  Increase R knee strength to at least 4+/5 Baseline:  Goal status: INITIAL  4.  Patient will be able to walk on level and unlevel surfaces, x at least 500', I, no pain Baseline:  Goal status: INITIAL  5.  Up and down at least 15 steps with UUE support, step over step each direction. Baseline:  Goal status: INITIAL   PLAN:  PT FREQUENCY: 2x/week  PT DURATION: 12 weeks  PLANNED INTERVENTIONS: Therapeutic exercises, Therapeutic activity, Neuromuscular re-education, Balance training, Gait training, Patient/Family education, Self Care, Joint mobilization, Stair training, Dry Needling, Electrical stimulation, Cryotherapy, Moist heat, Vasopneumatic device, Ionotophoresis '4mg'$ /ml Dexamethasone, and Manual therapy  PLAN FOR NEXT SESSION: Update HEP, progress gait, strength, and ROM training.   Marcelina Morel, DPT 04/13/2022, 12:09 PM

## 2022-04-13 ENCOUNTER — Ambulatory Visit: Payer: BC Managed Care – PPO | Attending: Orthopedic Surgery | Admitting: Physical Therapy

## 2022-04-13 ENCOUNTER — Encounter: Payer: Self-pay | Admitting: Physical Therapy

## 2022-04-13 DIAGNOSIS — R278 Other lack of coordination: Secondary | ICD-10-CM

## 2022-04-13 DIAGNOSIS — R262 Difficulty in walking, not elsewhere classified: Secondary | ICD-10-CM | POA: Diagnosis present

## 2022-04-13 DIAGNOSIS — M6281 Muscle weakness (generalized): Secondary | ICD-10-CM | POA: Diagnosis present

## 2022-04-13 DIAGNOSIS — G8929 Other chronic pain: Secondary | ICD-10-CM | POA: Diagnosis present

## 2022-04-13 DIAGNOSIS — M25561 Pain in right knee: Secondary | ICD-10-CM

## 2022-04-13 DIAGNOSIS — R600 Localized edema: Secondary | ICD-10-CM | POA: Diagnosis present

## 2022-04-13 DIAGNOSIS — R293 Abnormal posture: Secondary | ICD-10-CM

## 2022-04-17 ENCOUNTER — Encounter: Payer: Self-pay | Admitting: Physical Therapy

## 2022-04-17 ENCOUNTER — Ambulatory Visit: Payer: BC Managed Care – PPO | Attending: Orthopedic Surgery | Admitting: Physical Therapy

## 2022-04-17 DIAGNOSIS — G8929 Other chronic pain: Secondary | ICD-10-CM | POA: Diagnosis present

## 2022-04-17 DIAGNOSIS — R278 Other lack of coordination: Secondary | ICD-10-CM | POA: Insufficient documentation

## 2022-04-17 DIAGNOSIS — R262 Difficulty in walking, not elsewhere classified: Secondary | ICD-10-CM | POA: Insufficient documentation

## 2022-04-17 DIAGNOSIS — M25561 Pain in right knee: Secondary | ICD-10-CM | POA: Insufficient documentation

## 2022-04-17 DIAGNOSIS — M6281 Muscle weakness (generalized): Secondary | ICD-10-CM | POA: Insufficient documentation

## 2022-04-17 DIAGNOSIS — R293 Abnormal posture: Secondary | ICD-10-CM | POA: Diagnosis present

## 2022-04-17 DIAGNOSIS — R600 Localized edema: Secondary | ICD-10-CM | POA: Insufficient documentation

## 2022-04-17 NOTE — Therapy (Signed)
OUTPATIENT PHYSICAL THERAPY LOWER EXTREMITY EVALUATION   Patient Name: Rebecca Gordon MRN: [id number] DOB:Jul 03, 1955, 67 y.o., female Today's Date: 04/17/2022  END OF SESSION:  PT End of Session - 04/17/22 1100     Visit Number 2    Date for PT Re-Evaluation 07/06/22    PT Start Time 1015    PT Stop Time 1055    PT Time Calculation (min) 40 min    Activity Tolerance Patient tolerated treatment well;Patient limited by pain    Behavior During Therapy [facility] for tasks assessed/performed             Past Medical History:  Diagnosis Date   Alcoholism ([place])    Anal fissure    Aneurysm ([place]) 09/2016   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   Anxiety    Colon polyps    Depression    Endometrial cancer ([place])    Grade I   Endometrial polyp    Family history of brain cancer    Family history of prostate cancer    Former smoker 09/23/2016   GERD (gastroesophageal reflux disease)    Nexium   H/O clavicle fracture 2017   Right   High cholesterol    History of bronchitis    History of pneumonia    years ago   Hypertension    LVH (left ventricular hypertrophy) 09/23/2016   Mild, noted on ECHO   Numbness    left fingers after TIA   OA (osteoarthritis)    "right knee"   Osteoporosis 08/2017   T score -2.7   Panic attacks    Postmenopausal bleeding    Thyroid nodule 2019   Multiple   TIA (transient ischemic attack) 09/2016   Wears contact lenses    Past Surgical History:  Procedure Laterality Date   CLAVICLE SURGERY Right 2016   fracture repair   COLONOSCOPY  2017   multiples   CRANIOTOMY FOR ANEURYSM / VERTEBROBASILAR / CAROTID CIRCULATION  09/05/2021   DILATATION & CURETTAGE/HYSTEROSCOPY WITH MYOSURE N/A 05/26/2018   Procedure: DILATATION & CURETTAGE/HYSTEROSCOPY WITH MYOSURE;  Surgeon: Princess Bruins, MD;  Location: [place];  Service: Gynecology;  Laterality: N/A;   EYE SURGERY Right    "growth on eye removed"   FOOT SURGERY Right  08/2017   benign mass   HEMORRHOID SURGERY N/A 06/24/2018   Procedure: HEMORRHOIDECTOMY;  Surgeon: Michael Boston, MD;  Location: [place];  Service: General;  Laterality: N/A;   KNEE ARTHROSCOPY Right    Menicus repair   ORIF CLAVICULAR FRACTURE Right 04/28/2015   Procedure: REVISION ORIF RIGHT CLAVICAL FRACTURE, ALLOGRAFT BONE GRAFTING;  Surgeon: Justice Britain, MD;  Location: [place];  Service: Orthopedics;  Laterality: Right;   Peridontal Laser Surgery  01/2019   due to infection, x 2   PLACEMENT OF BREAST IMPLANTS Bilateral    ROBOTIC ASSISTED TOTAL HYSTERECTOMY WITH BILATERAL SALPINGO OOPHERECTOMY Bilateral 07/10/2018   Procedure: XI ROBOTIC ASSISTED TOTAL HYSTERECTOMY WITH BILATERAL SALPINGO OOPHORECTOMY;  Surgeon: Everitt Amber, MD;  [facility];  Service: Gynecology;  Laterality: Bilateral;   SENTINEL NODE BIOPSY N/A 07/10/2018   Procedure: SENTINEL NODE BIOPSY, LEFT PELVIC NODE;  Surgeon: Everitt Amber, MD;  [facility];  Service: Gynecology;  Laterality: N/A;   SPHINCTEROTOMY N/A 06/24/2018   Procedure: ANAL SPHINCTEROTOMY, ANORECTAL EXAM UNDER ANESTHESIA;  Surgeon: Michael Boston, MD;  Location: [place];  Service: General;  Laterality: N/A;   TONSILLECTOMY     Patient Active  Problem List   Diagnosis Date Noted   Brain aneurysm 07/28/2021   Preop examination 07/07/2021   Eczema 07/07/2021   Myalgia 02/14/2021   Left leg pain 12/28/2020   Bilateral hearing loss 05/07/2020   Depression, major, single episode, moderate ([place]) 05/07/2020   Acute right-sided low back pain without sciatica 09/14/2019   Foul smelling urine 09/14/2019   Family history of colon cancer 02/23/2019   Family history of prostate cancer    Family history of brain cancer    Rash 01/13/2019   Hemorrhoids 08/01/2018   Anal fissure s/p internal sphincterotomy 06/24/2018 06/24/2018   Prolapsed internal hemorrhoids, grade 2&3, s/p ligation/pexy/hemorrhoidectomy 06/24/2018  06/24/2018   Endometrial cancer ([place]) 06/05/2018   Rectal tear 03/24/2018   Preventative health care 08/09/2017   Depression with anxiety 08/09/2017   Hyperlipidemia LDL goal <100 08/09/2017   Seasonal allergies 08/09/2017   Osteoarthritis of knee 05/29/2017   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   Hx of anxiety disorder 04/03/2017   Smoking greater than 30 pack years 04/03/2017   Emphysema of lung ([place]) 04/03/2017   Arteriosclerotic cardiovascular disease (ASCVD) 04/03/2017   Alcoholism in remission ([place]) 04/02/2017   History of transient ischemic attack (TIA) 09/27/2016   Transient cerebral ischemia 09/27/2016   Gastroesophageal reflux disease 09/27/2016   Generalized anxiety disorder 09/27/2016   Gait disturbance 09/23/2016   Leukocytosis 09/23/2016   Primary hypertension 09/23/2016   HLD (hyperlipidemia) 09/23/2016   Former smoker 09/23/2016   Paresthesias 09/22/2016   SINUSITIS- ACUTE-NOS 05/12/2008    PCP: [name], DO  REFERRING PROVIDER: Gaynelle Arabian, MD  REFERRING DIAG:  Diagnosis  [phone] (ICD-10-CM) - Presence of right artificial knee joint    THERAPY DIAG:  Chronic pain of right knee  Muscle weakness (generalized)  Localized edema  Difficulty in walking, not elsewhere classified  Abnormal posture  Other lack of coordination  Rationale for Evaluation and Treatment: Rehabilitation  ONSET DATE: 04/11/22  SUBJECTIVE:   SUBJECTIVE STATEMENT: Patient reports bleeding from her incision after treatment on Friday. She went in the the Dr who dressed her leg with an ace wrap. She returns today. Thigh much more pliable today.  PERTINENT HISTORY: S/P R TKR Brain aneurysm-clipped, OA R knee, scoliosis, leg length discrepancy, R shorter than L  PAIN:  Are you having pain? Yes: NPRS scale: 9/10 Pain location: R knee Pain description: sharp Aggravating factors: stiffness Relieving  factors: Medication , ice  PRECAUTIONS: None  WEIGHT BEARING RESTRICTIONS: No  FALLS:  Has patient fallen in last 6 months? 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   PLOF: Independent  PATIENT GOALS: Recover fully, walk.  NEXT MD VISIT: 3 weeks  OBJECTIVE:   DIAGNOSTIC FINDINGS: N/A   COGNITION: Overall cognitive status: Within functional limits for tasks assessed     SENSATION: Not tested  EDEMA:  Expected swelling   POSTURE: weight shift left  PALPATION: R thigh was severely indurated above the ace wrap, painful to touch.  LOWER EXTREMITY ROM: WNL except where noted.  Passive ROM Right eval Left eval  Hip flexion    Hip extension    Hip abduction    Hip adduction    Hip internal rotation    Hip external rotation  Knee flexion 85   Knee extension 27   Ankle dorsiflexion    Ankle plantarflexion    Ankle inversion    Ankle eversion      LOWER EXTREMITY MMT: LLE 5  MMT Right eval Left eval  Hip flexion 3-   Hip extension    Hip abduction 3-   Hip adduction    Hip internal rotation    Hip external rotation    Knee flexion 2+   Knee extension 3-   Ankle dorsiflexion 4   Ankle plantarflexion    Ankle inversion    Ankle eversion      GAIT: Distance walked: In clinic distances Assistive device utilized: Walker - 2 wheeled Level of assistance: Modified independence Comments: Patient reports difficulty managing her 2 steps. Educated to correct technique, she has a handle to hold onto.Walked in with max reliance on BUE support, step to gait pattern with RLE slightly ER throughout gait cycle.   TODAY'S TREATMENT:                                                                                                                               DATE:  04/17/22 Supine knee press 10 x 5 sec Heel slides with min A x 10 reps Short kick, 10 x 5 seconds SLR x 10 reps Long kicks/knee flexion in sitting x 10 Seated AAROM flex and hold with overpressure, contract relax. 85 degrees AA flexion. NuStep L3 x 6 minutes, legs only Gait training with RW, Mod VC and min TC at R hip to maintain upright posture and step through pattern.  04/13/22  Education Gait training-initiated step through gait training, with increased WB through RLE in cycle, improved with practice. Quad sets, SAQ, HS, SLR, seated long kicks and knee flexion R hip IR in supine. Educated to prevent passive hip ER when lying down.  PATIENT EDUCATION:  Education details: HEP, POC Person educated: Patient and Spouse Education method: Explanation, Demonstration, and Handouts Education comprehension: verbalized understanding and returned demonstration  HOME EXERCISE PROGRAM: [id number]  ASSESSMENT:  CLINICAL IMPRESSION: Patient reports episode of bleeding after treatment on Friday. She returned to Dr who re-dressed the limb, flet it was due to the severe swelling. Thigh is much more soft, although some induration remains. She has progressed with her strength and gait pattern, Rom about the same, not unexpected after her issues.  OBJECTIVE IMPAIRMENTS: Abnormal gait, decreased activity tolerance, decreased balance, decreased coordination, decreased mobility, difficulty walking, decreased ROM, decreased strength, increased muscle spasms, impaired flexibility, postural dysfunction, and pain.   ACTIVITY LIMITATIONS: carrying, lifting, bending, sitting, standing, squatting, sleeping, stairs, transfers, bathing, toileting, and locomotion level  PARTICIPATION LIMITATIONS: meal prep, cleaning, laundry, driving, shopping, community activity, and occupation  PERSONAL FACTORS: Age and Past/current experiences are also affecting patient's functional outcome.    REHAB POTENTIAL: Good  CLINICAL DECISION MAKING: Evolving/moderate complexity  EVALUATION COMPLEXITY: Moderate   GOALS: Goals reviewed with patient? Yes  SHORT  TERM GOALS: Target date: 05/03/22 I with initial HEP Baseline: Goal status: INITIAL  LONG TERM GOALS: Target date: 07/06/22  I with final HEP Baseline:  Goal status: INITIAL  2.  Increase r knee ROM to <5-120 Baseline:  Goal status: INITIAL  3.  Increase R knee strength to at least 4+/5 Baseline:  Goal status: INITIAL  4.  Patient will be able to walk on level and unlevel surfaces, x at least 500', I, no pain Baseline:  Goal status: INITIAL  5.  Up and down at least 15 steps with UUE support, step over step each direction. Baseline:  Goal status: INITIAL   PLAN:  PT FREQUENCY: 2x/week  PT DURATION: 12 weeks  PLANNED INTERVENTIONS: Therapeutic exercises, Therapeutic activity, Neuromuscular re-education, Balance training, Gait training, Patient/Family education, Self Care, Joint mobilization, Stair training, Dry Needling, Electrical stimulation, Cryotherapy, Moist heat, Vasopneumatic device, Ionotophoresis '4mg'$ /ml Dexamethasone, and Manual therapy  PLAN FOR NEXT SESSION: Update HEP, progress gait, strength, and ROM training.   Marcelina Morel, DPT 04/17/2022, 11:01 AM

## 2022-04-19 ENCOUNTER — Ambulatory Visit: Payer: BC Managed Care – PPO | Admitting: Physical Therapy

## 2022-04-19 ENCOUNTER — Encounter: Payer: Self-pay | Admitting: Physical Therapy

## 2022-04-19 DIAGNOSIS — M25561 Pain in right knee: Secondary | ICD-10-CM | POA: Diagnosis not present

## 2022-04-19 DIAGNOSIS — M6281 Muscle weakness (generalized): Secondary | ICD-10-CM

## 2022-04-19 DIAGNOSIS — R262 Difficulty in walking, not elsewhere classified: Secondary | ICD-10-CM

## 2022-04-19 DIAGNOSIS — G8929 Other chronic pain: Secondary | ICD-10-CM

## 2022-04-19 DIAGNOSIS — R600 Localized edema: Secondary | ICD-10-CM

## 2022-04-19 NOTE — Therapy (Signed)
OUTPATIENT PHYSICAL THERAPY LOWER EXTREMITY EVALUATION   Patient Name: Karyna Bessler MRN: [id number] DOB:[birth year], 67 y.o., female Today's Date: 04/19/2022  END OF SESSION:  PT End of Session - 04/19/22 1017     Visit Number 3    Date for PT Re-Evaluation 07/06/22    PT Start Time 1015    PT Stop Time 1100    PT Time Calculation (min) 45 min    Activity Tolerance Patient tolerated treatment well;Patient limited by pain    Behavior During Therapy 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   Anxiety    Colon polyps    Depression    Endometrial cancer ([place])    Grade I   Endometrial polyp    Family history of brain cancer    Family history of prostate cancer    Former smoker 09/23/2016   GERD (gastroesophageal reflux disease)    Nexium   H/O clavicle fracture 2017   Right   High cholesterol    History of bronchitis    History of pneumonia    years ago   Hypertension    LVH (left ventricular hypertrophy) 09/23/2016   Mild, noted on ECHO   Numbness    left fingers after TIA   OA (osteoarthritis)    "right knee"   Osteoporosis 08/2017   T score -2.7   Panic attacks    Postmenopausal bleeding    Thyroid nodule 2019   Multiple   TIA (transient ischemic attack) 09/2016   Wears contact lenses    Past Surgical History:  Procedure Laterality Date   CLAVICLE SURGERY Right 2016   fracture repair   COLONOSCOPY  2017   multiples   CRANIOTOMY FOR ANEURYSM / VERTEBROBASILAR / CAROTID CIRCULATION  09/05/2021   DILATATION & CURETTAGE/HYSTEROSCOPY WITH MYOSURE N/A 05/26/2018   Procedure: DILATATION & CURETTAGE/HYSTEROSCOPY WITH MYOSURE;  Surgeon: Princess Bruins, MD;  Location: [place];  Service: Gynecology;  Laterality: N/A;   EYE SURGERY Right    "growth on eye removed"   FOOT SURGERY Right  08/2017   benign mass   HEMORRHOID SURGERY N/A 06/24/2018   Procedure: HEMORRHOIDECTOMY;  Surgeon: Michael Boston, MD;  Location: [place];  Service: General;  Laterality: N/A;   KNEE ARTHROSCOPY Right    Menicus repair   ORIF CLAVICULAR FRACTURE Right 04/28/2015   Procedure: REVISION ORIF RIGHT CLAVICAL FRACTURE, ALLOGRAFT BONE GRAFTING;  Surgeon: Justice Britain, MD;  Location: [place];  Service: Orthopedics;  Laterality: Right;   Peridontal Laser Surgery  01/2019   due to infection, x 2   PLACEMENT OF BREAST IMPLANTS Bilateral    ROBOTIC ASSISTED TOTAL HYSTERECTOMY WITH BILATERAL SALPINGO OOPHERECTOMY Bilateral 07/10/2018   Procedure: XI ROBOTIC ASSISTED TOTAL HYSTERECTOMY WITH BILATERAL SALPINGO OOPHORECTOMY;  Surgeon: Everitt Amber, MD;  [facility];  Service: Gynecology;  Laterality: Bilateral;   SENTINEL NODE BIOPSY N/A 07/10/2018   Procedure: SENTINEL NODE BIOPSY, LEFT PELVIC NODE;  Surgeon: Everitt Amber, MD;  [facility];  Service: Gynecology;  Laterality: N/A;   SPHINCTEROTOMY N/A 06/24/2018   Procedure: ANAL SPHINCTEROTOMY, ANORECTAL EXAM UNDER ANESTHESIA;  Surgeon: Michael Boston, MD;  Location: [place];  Service: General;  Laterality: N/A;   TONSILLECTOMY     Patient Active  Problem List   Diagnosis Date Noted   Brain aneurysm 07/28/2021   Preop examination 07/07/2021   Eczema 07/07/2021   Myalgia 02/14/2021   Left leg pain 12/28/2020   Bilateral hearing loss 05/07/2020   Depression, major, single episode, moderate ([place]) 05/07/2020   Acute right-sided low back pain without sciatica 09/14/2019   Foul smelling urine 09/14/2019   Family history of colon cancer 02/23/2019   Family history of prostate cancer    Family history of brain cancer    Rash 01/13/2019   Hemorrhoids 08/01/2018   Anal fissure s/p internal sphincterotomy 06/24/2018 06/24/2018   Prolapsed internal hemorrhoids, grade 2&3, s/p ligation/pexy/hemorrhoidectomy 06/24/2018  06/24/2018   Endometrial cancer ([place]) 06/05/2018   Rectal tear 03/24/2018   Preventative health care 08/09/2017   Depression with anxiety 08/09/2017   Hyperlipidemia LDL goal <100 08/09/2017   Seasonal allergies 08/09/2017   Osteoarthritis of knee 05/29/2017   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   Hx of anxiety disorder 04/03/2017   Smoking greater than 30 pack years 04/03/2017   Emphysema of lung ([place]) 04/03/2017   Arteriosclerotic cardiovascular disease (ASCVD) 04/03/2017   Alcoholism in remission ([place]) 04/02/2017   History of transient ischemic attack (TIA) 09/27/2016   Transient cerebral ischemia 09/27/2016   Gastroesophageal reflux disease 09/27/2016   Generalized anxiety disorder 09/27/2016   Gait disturbance 09/23/2016   Leukocytosis 09/23/2016   Primary hypertension 09/23/2016   HLD (hyperlipidemia) 09/23/2016   Former smoker 09/23/2016   Paresthesias 09/22/2016   SINUSITIS- ACUTE-NOS 05/12/2008    PCP: [name], DO  REFERRING PROVIDER: Gaynelle Arabian, MD  REFERRING DIAG:  Diagnosis  [phone] (ICD-10-CM) - Presence of right artificial knee joint    THERAPY DIAG:  Chronic pain of right knee  Localized edema  Difficulty in walking, not elsewhere classified  Muscle weakness (generalized)  Rationale for Evaluation and Treatment: Rehabilitation  ONSET DATE: 04/11/22  SUBJECTIVE:   SUBJECTIVE STATEMENT: "Its a little achy today"  PERTINENT HISTORY: S/P R TKR Brain aneurysm-clipped, OA R knee, scoliosis, leg length discrepancy, R shorter than L  PAIN:  Are you having pain? Yes: NPRS scale: 6/10 Pain location: R knee Pain description: sharp Aggravating factors: stiffness Relieving factors: Medication , ice  PRECAUTIONS: None  WEIGHT BEARING RESTRICTIONS: No  FALLS:  Has patient fallen in last 6 months? 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   PLOF: Independent  PATIENT GOALS: Recover fully, walk.  NEXT MD VISIT: 3 weeks  OBJECTIVE:   DIAGNOSTIC FINDINGS: N/A   COGNITION: Overall cognitive status: Within functional limits for tasks assessed     SENSATION: Not tested  EDEMA:  Expected swelling   POSTURE: weight shift left  PALPATION: R thigh was severely indurated above the ace wrap, painful to touch.  LOWER EXTREMITY ROM: WNL except where noted.  Passive ROM Right eval Left eval  Hip flexion    Hip extension    Hip abduction    Hip adduction    Hip internal rotation    Hip external rotation    Knee flexion 85   Knee extension 27   Ankle dorsiflexion    Ankle plantarflexion    Ankle inversion    Ankle eversion      LOWER  EXTREMITY MMT: LLE 5  MMT Right eval Left eval  Hip flexion 3-   Hip extension    Hip abduction 3-   Hip adduction    Hip internal rotation    Hip external rotation    Knee flexion 2+   Knee extension 3-   Ankle dorsiflexion 4   Ankle plantarflexion    Ankle inversion    Ankle eversion      GAIT: Distance walked: In clinic distances Assistive device utilized: Walker - 2 wheeled Level of assistance: Modified independence Comments: Patient reports difficulty managing her 2 steps. Educated to correct technique, she has a handle to hold onto.Walked in with max reliance on BUE support, step to gait pattern with RLE slightly ER throughout gait cycle.   TODAY'S TREATMENT:                                                                                                                              DATE:  04/19/21 R knee PROM  NuStep L5 x 5 min, L 1 x 60mn LE only S2S 3x5 no UE for the 3rd set  LAQ RLE 2lb 2x10 Hamstring Curls RLE red 2x10 Quad sets RLE 2x10 Standing  marches 3x5    04/17/22 Supine knee press 10 x 5 sec Heel slides with min A x 10 reps Short kick, 10 x 5 seconds SLR x 10 reps Long kicks/knee flexion in sitting x 10 Seated AAROM flex and hold with overpressure, contract relax. 85 degrees AA flexion. NuStep L3 x 6 minutes, legs only Gait training with RW, Mod VC and min TC at R hip to maintain upright posture and step through pattern.  04/13/22  Education Gait training-initiated step through gait training, with increased WB through RLE in cycle, improved with practice. Quad sets, SAQ, HS, SLR, seated long kicks and knee flexion R hip IR in supine. Educated to prevent passive hip ER when lying down.  PATIENT EDUCATION:  Education details: HEP, POC Person educated: Patient and Spouse Education method: Explanation, Demonstration, and Handouts Education comprehension: verbalized understanding and returned demonstration  HOME EXERCISE PROGRAM: [id number] ASSESSMENT:  CLINICAL IMPRESSION: Pt enters doing well. Cue throughout session to relax when sitting allowing R knee to bend. Added some functional interventions during session such ass sit to stand and marching. Pt did compensate with both movement but able to minimize with cues.Some pain at eh end rage of passive flex and ext but tolerated it well   OBJECTIVE IMPAIRMENTS: Abnormal gait, decreased activity tolerance, decreased balance, decreased coordination, decreased mobility, difficulty walking, decreased ROM, decreased strength, increased muscle spasms, impaired flexibility, postural dysfunction, and pain.   ACTIVITY LIMITATIONS: carrying, lifting, bending, sitting, standing, squatting, sleeping, stairs, transfers, bathing, toileting, and locomotion level  PARTICIPATION LIMITATIONS: meal prep, cleaning, laundry, driving, shopping, community activity, and occupation  PERSONAL FACTORS: Age and Past/current experiences are also affecting patient's functional outcome.   REHAB  POTENTIAL: Good  CLINICAL DECISION MAKING: Evolving/moderate  complexity  EVALUATION COMPLEXITY: Moderate   GOALS: Goals reviewed with patient? Yes  SHORT TERM GOALS: Target date: 05/03/22 I with initial HEP Baseline: Goal status: INITIAL  LONG TERM GOALS: Target date: 07/06/22  I with final HEP Baseline:  Goal status: INITIAL  2.  Increase r knee ROM to <5-120 Baseline:  Goal status: INITIAL  3.  Increase R knee strength to at least 4+/5 Baseline:  Goal status: INITIAL  4.  Patient will be able to walk on level and unlevel surfaces, x at least 500', I, no pain Baseline:  Goal status: INITIAL  5.  Up and down at least 15 steps with UUE support, step over step each direction. Baseline:  Goal status: INITIAL   PLAN:  PT FREQUENCY: 2x/week  PT DURATION: 12 weeks  PLANNED INTERVENTIONS: Therapeutic exercises, Therapeutic activity, Neuromuscular re-education, Balance training, Gait training, Patient/Family education, Self Care, Joint mobilization, Stair training, Dry Needling, Electrical stimulation, Cryotherapy, Moist heat, Vasopneumatic device, Ionotophoresis '4mg'$ /ml Dexamethasone, and Manual therapy  PLAN FOR NEXT SESSION: Update HEP, progress gait, strength, and ROM training.   Marcelina Morel, DPT 04/19/2022, 10:17 AM

## 2022-04-24 ENCOUNTER — Ambulatory Visit: Payer: BC Managed Care – PPO | Admitting: Physical Therapy

## 2022-04-24 ENCOUNTER — Encounter: Payer: Self-pay | Admitting: Physical Therapy

## 2022-04-24 DIAGNOSIS — M6281 Muscle weakness (generalized): Secondary | ICD-10-CM

## 2022-04-24 DIAGNOSIS — M25561 Pain in right knee: Secondary | ICD-10-CM | POA: Diagnosis not present

## 2022-04-24 DIAGNOSIS — R600 Localized edema: Secondary | ICD-10-CM

## 2022-04-24 DIAGNOSIS — G8929 Other chronic pain: Secondary | ICD-10-CM

## 2022-04-24 DIAGNOSIS — R262 Difficulty in walking, not elsewhere classified: Secondary | ICD-10-CM

## 2022-04-24 NOTE — Therapy (Signed)
OUTPATIENT PHYSICAL THERAPY LOWER EXTREMITY TREATMENT   Patient Name: Rebecca Gordon MRN: [id number] DOB:1955-08-13, 67 y.o., female Today's Date: 04/24/2022  END OF SESSION:  PT End of Session - 04/24/22 1012     Visit Number 4    Date for PT Re-Evaluation 07/06/22    PT Start Time 1015    PT Stop Time 1100    PT Time Calculation (min) 45 min    Activity Tolerance Patient tolerated treatment well;Patient limited by pain    Behavior During Therapy [facility] for tasks assessed/performed             Past Medical History:  Diagnosis Date   Alcoholism ([place])    Anal fissure    Aneurysm ([place]) 09/2016   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   Anxiety    Colon polyps    Depression    Endometrial cancer ([place])    Grade I   Endometrial polyp    Family history of brain cancer    Family history of prostate cancer    Former smoker 09/23/2016   GERD (gastroesophageal reflux disease)    Nexium   H/O clavicle fracture 2017   Right   High cholesterol    History of bronchitis    History of pneumonia    years ago   Hypertension    LVH (left ventricular hypertrophy) 09/23/2016   Mild, noted on ECHO   Numbness    left fingers after TIA   OA (osteoarthritis)    "right knee"   Osteoporosis 08/2017   T score -2.7   Panic attacks    Postmenopausal bleeding    Thyroid nodule 2019   Multiple   TIA (transient ischemic attack) 09/2016   Wears contact lenses    Past Surgical History:  Procedure Laterality Date   CLAVICLE SURGERY Right 2016   fracture repair   COLONOSCOPY  2017   multiples   CRANIOTOMY FOR ANEURYSM / VERTEBROBASILAR / CAROTID CIRCULATION  09/05/2021   DILATATION & CURETTAGE/HYSTEROSCOPY WITH MYOSURE N/A 05/26/2018   Procedure: DILATATION & CURETTAGE/HYSTEROSCOPY WITH MYOSURE;  Surgeon: Princess Bruins, MD;  Location: [place];  Service: Gynecology;  Laterality: N/A;   EYE SURGERY Right    "growth on eye removed"   FOOT SURGERY Right 08/2017    benign mass   HEMORRHOID SURGERY N/A 06/24/2018   Procedure: HEMORRHOIDECTOMY;  Surgeon: Michael Boston, MD;  Location: [place];  Service: General;  Laterality: N/A;   KNEE ARTHROSCOPY Right    Menicus repair   ORIF CLAVICULAR FRACTURE Right 04/28/2015   Procedure: REVISION ORIF RIGHT CLAVICAL FRACTURE, ALLOGRAFT BONE GRAFTING;  Surgeon: Justice Britain, MD;  Location: [place];  Service: Orthopedics;  Laterality: Right;   Peridontal Laser Surgery  01/2019   due to infection, x 2   PLACEMENT OF BREAST IMPLANTS Bilateral    ROBOTIC ASSISTED TOTAL HYSTERECTOMY WITH BILATERAL SALPINGO OOPHERECTOMY Bilateral 07/10/2018   Procedure: XI ROBOTIC ASSISTED TOTAL HYSTERECTOMY WITH BILATERAL SALPINGO OOPHORECTOMY;  Surgeon: Everitt Amber, MD;  Location[facility];  Service: Gynecology;  Laterality: Bilateral;   SENTINEL NODE BIOPSY N/A 07/10/2018   Procedure: SENTINEL NODE BIOPSY, LEFT PELVIC NODE;  Surgeon: Everitt Amber, MD;  Location[facility];  Service: Gynecology;  Laterality: N/A;   SPHINCTEROTOMY N/A 06/24/2018   Procedure: ANAL SPHINCTEROTOMY, ANORECTAL EXAM UNDER ANESTHESIA;  Surgeon: Michael Boston, MD;  Location: [place];  Service: General;  Laterality: N/A;   TONSILLECTOMY     Patient Active  Problem List   Diagnosis Date Noted   Brain aneurysm 07/28/2021   Preop examination 07/07/2021   Eczema 07/07/2021   Myalgia 02/14/2021   Left leg pain 12/28/2020   Bilateral hearing loss 05/07/2020   Depression, major, single episode, moderate ([place]) 05/07/2020   Acute right-sided low back pain without sciatica 09/14/2019   Foul smelling urine 09/14/2019   Family history of colon cancer 02/23/2019   Family history of prostate cancer    Family history of brain cancer    Rash 01/13/2019   Hemorrhoids 08/01/2018   Anal fissure s/p internal sphincterotomy 06/24/2018 06/24/2018   Prolapsed internal hemorrhoids, grade 2&3, s/p ligation/pexy/hemorrhoidectomy 06/24/2018  06/24/2018   Endometrial cancer ([place]) 06/05/2018   Rectal tear 03/24/2018   Preventative health care 08/09/2017   Depression with anxiety 08/09/2017   Hyperlipidemia LDL goal <100 08/09/2017   Seasonal allergies 08/09/2017   Osteoarthritis of knee 05/29/2017   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/2017   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   Hx of anxiety disorder 04/03/2017   Smoking greater than 30 pack years 04/03/2017   Emphysema of lung ([place]) 04/03/2017   Arteriosclerotic cardiovascular disease (ASCVD) 04/03/2017   Alcoholism in remission ([place]) 04/02/2017   History of transient ischemic attack (TIA) 09/27/2016   Transient cerebral ischemia 09/27/2016   Gastroesophageal reflux disease 09/27/2016   Generalized anxiety disorder 09/27/2016   Gait disturbance 09/23/2016   Leukocytosis 09/23/2016   Primary hypertension 09/23/2016   HLD (hyperlipidemia) 09/23/2016   Former smoker 09/23/2016   Paresthesias 09/22/2016   SINUSITIS- ACUTE-NOS 05/12/2008    PCP: [name], DO  REFERRING PROVIDER: Gaynelle Arabian, MD  REFERRING DIAG:  Diagnosis  [phone] (ICD-10-CM) - Presence of right artificial knee joint    THERAPY DIAG:  Chronic pain of right knee  Difficulty in walking, not elsewhere classified  Muscle weakness (generalized)  Localized edema  Rationale for Evaluation and Treatment: Rehabilitation  ONSET DATE: 04/11/22  SUBJECTIVE:   SUBJECTIVE STATEMENT: "I feel good" Feeling better every day  PERTINENT HISTORY: S/P R TKR Brain aneurysm-clipped, OA R knee, scoliosis, leg length discrepancy, R shorter than L  PAIN:  Are you having pain? Yes: NPRS scale: 4/10 Pain location: R knee Pain description: sharp Aggravating factors: stiffness Relieving factors: Medication , ice  PRECAUTIONS: None  WEIGHT BEARING RESTRICTIONS: No  FALLS:  Has patient fallen in last 6 months? 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   PLOF: Independent  PATIENT GOALS: Recover fully, walk.  NEXT MD VISIT: 3 weeks  OBJECTIVE:   DIAGNOSTIC FINDINGS: N/A   COGNITION: Overall cognitive status: Within functional limits for tasks assessed     SENSATION: Not tested  EDEMA:  Expected swelling   POSTURE: weight shift left  PALPATION: R thigh was severely indurated above the ace wrap, painful to touch.  LOWER EXTREMITY ROM: WNL except where noted.  Passive ROM Right eval Left eval  Hip flexion    Hip extension    Hip abduction    Hip adduction    Hip internal rotation    Hip external rotation    Knee flexion 85   Knee extension 27   Ankle dorsiflexion    Ankle plantarflexion    Ankle inversion    Ankle eversion  LOWER EXTREMITY MMT: LLE 5  MMT Right eval Left eval  Hip flexion 3-   Hip extension    Hip abduction 3-   Hip adduction    Hip internal rotation    Hip external rotation    Knee flexion 2+   Knee extension 3-   Ankle dorsiflexion 4   Ankle plantarflexion    Ankle inversion    Ankle eversion      GAIT: Distance walked: In clinic distances Assistive device utilized: Walker - 2 wheeled Level of assistance: Modified independence Comments: Patient reports difficulty managing her 2 steps. Educated to correct technique, she has a handle to hold onto.Walked in with max reliance on BUE support, step to gait pattern with RLE slightly ER throughout gait cycle.   TODAY'S TREATMENT:                                                                                                                              DATE:  04/24/22 NuStep L5 x 6 min R knee PROM with end range holds S2S 2x10 Gait 154f No ad, decrease R knee flexion lateral trunk sway, Gait SPC 1575fLeg press 20lb  2x10 LAQ RLE 3lb 3x10 Hamstring curls red RLE 2x10  04/19/21 R knee PROM  NuStep L5 x 5 min, L 1 x [id number] only S2S 3x5 no UE for the 3rd set  LAQ RLE 2lb 2x10 Hamstring Curls RLE red 2x10 Quad sets RLE 2x10 Standing marches 3x5    04/17/22 Supine knee press 10 x 5 sec Heel slides with min A x 10 reps Short kick, 10 x 5 seconds SLR x 10 reps Long kicks/knee flexion in sitting x 10 Seated AAROM flex and hold with overpressure, contract relax. 85 degrees AA flexion. NuStep L3 x 6 minutes, legs only Gait training with RW, Mod VC and min TC at R hip to maintain upright posture and step through pattern.  04/13/22  Education Gait training-initiated step through gait training, with increased WB through RLE in cycle, improved with practice. Quad sets, SAQ, HS, SLR, seated long kicks and knee flexion R hip IR in supine. Educated to prevent passive hip ER when lying down.  PATIENT EDUCATION:  Education details: HEP, POC Person educated: Patient and Spouse Education method: Explanation, Demonstration, and Handouts Education comprehension: verbalized understanding and returned demonstration  HOME EXERCISE PROGRAM: [id number]:  CLINICAL IMPRESSION: Pt enters doing well. Cue for LE placement needed with sit to stands. Progressed to gait with SPC and without AD. Cue to increase R knee flexion with gait. Cue needed to hold quad contraction with LAQ.  Pain reported with hamstring curls. No issue with leg ress  OBJECTIVE IMPAIRMENTS: Abnormal gait, decreased activity tolerance, decreased balance, decreased coordination, decreased mobility, difficulty walking, decreased ROM, decreased strength, increased muscle spasms, impaired flexibility, postural dysfunction, and pain.   ACTIVITY LIMITATIONS: carrying, lifting, bending, sitting, standing, squatting, sleeping, stairs, transfers, bathing, toileting, and locomotion level  PARTICIPATION LIMITATIONS: meal prep, cleaning, laundry,  driving, shopping, community activity, and occupation  PERSONAL FACTORS: Age and Past/current experiences are also affecting patient's functional outcome.   REHAB POTENTIAL: Good  CLINICAL DECISION MAKING: Evolving/moderate complexity  EVALUATION COMPLEXITY: Moderate   GOALS: Goals reviewed with patient? Yes  SHORT TERM GOALS: Target date: 05/03/22 I with initial HEP Baseline: Goal status: INITIAL  LONG TERM GOALS: Target date: 07/06/22  I with final HEP Baseline:  Goal status: INITIAL  2.  Increase r knee ROM to <5-120 Baseline:  Goal status: INITIAL  3.  Increase R knee strength to at least 4+/5 Baseline:  Goal status: INITIAL  4.  Patient will be able to walk on level and unlevel surfaces, x at least 500', I, no pain Baseline:  Goal status: INITIAL  5.  Up and down at least 15 steps with UUE support, step over step each direction. Baseline:  Goal status: INITIAL   PLAN:  PT FREQUENCY: 2x/week  PT DURATION: 12 weeks  PLANNED INTERVENTIONS: Therapeutic exercises, Therapeutic activity, Neuromuscular re-education, Balance training, Gait training, Patient/Family education, Self Care, Joint mobilization, Stair training, Dry Needling, Electrical stimulation, Cryotherapy, Moist heat, Vasopneumatic device, Ionotophoresis '4mg'$ /ml Dexamethasone, and Manual therapy  PLAN FOR NEXT SESSION: Update HEP, progress gait, strength, and ROM training.   Marcelina Morel, DPT 04/24/2022, 10:12 AM

## 2022-04-26 ENCOUNTER — Encounter: Payer: Self-pay | Admitting: Physical Therapy

## 2022-04-26 ENCOUNTER — Ambulatory Visit: Payer: BC Managed Care – PPO | Admitting: Physical Therapy

## 2022-04-26 DIAGNOSIS — R262 Difficulty in walking, not elsewhere classified: Secondary | ICD-10-CM

## 2022-04-26 DIAGNOSIS — R600 Localized edema: Secondary | ICD-10-CM

## 2022-04-26 DIAGNOSIS — M6281 Muscle weakness (generalized): Secondary | ICD-10-CM

## 2022-04-26 DIAGNOSIS — G8929 Other chronic pain: Secondary | ICD-10-CM

## 2022-04-26 DIAGNOSIS — M25561 Pain in right knee: Secondary | ICD-10-CM | POA: Diagnosis not present

## 2022-04-26 NOTE — Therapy (Signed)
OUTPATIENT PHYSICAL THERAPY LOWER EXTREMITY TREATMENT   Patient Name: Rebecca Gordon MRN: [id number] DOB:01-18-1956, 66 y.o., female Today's Date: 04/26/2022  END OF SESSION:  PT End of Session - 04/26/22 1017     Visit Number 5    Date for PT Re-Evaluation 07/06/22    PT Start Time 1016    PT Stop Time 1100    PT Time Calculation (min) 44 min    Activity Tolerance Patient tolerated treatment well;Patient limited by pain    Behavior During Therapy [facility] for tasks assessed/performed             Past Medical History:  Diagnosis Date   Alcoholism ([place])    Anal fissure    Aneurysm ([place]) 09/2016   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   Anxiety    Colon polyps    Depression    Endometrial cancer ([place])    Grade I   Endometrial polyp    Family history of brain cancer    Family history of prostate cancer    Former smoker 09/23/2016   GERD (gastroesophageal reflux disease)    Nexium   H/O clavicle fracture 2017   Right   High cholesterol    History of bronchitis    History of pneumonia    years ago   Hypertension    LVH (left ventricular hypertrophy) 09/23/2016   Mild, noted on ECHO   Numbness    left fingers after TIA   OA (osteoarthritis)    "right knee"   Osteoporosis 08/2017   T score -2.7   Panic attacks    Postmenopausal bleeding    Thyroid nodule 2019   Multiple   TIA (transient ischemic attack) 09/2016   Wears contact lenses    Past Surgical History:  Procedure Laterality Date   CLAVICLE SURGERY Right 2016   fracture repair   COLONOSCOPY  2017   multiples   CRANIOTOMY FOR ANEURYSM / VERTEBROBASILAR / CAROTID CIRCULATION  09/05/2021   DILATATION & CURETTAGE/HYSTEROSCOPY WITH MYOSURE N/A 05/26/2018   Procedure: DILATATION & CURETTAGE/HYSTEROSCOPY WITH MYOSURE;  Surgeon: Princess Bruins, MD;  Location: [place];  Service: Gynecology;  Laterality: N/A;   EYE SURGERY Right    "growth on eye removed"   FOOT SURGERY Right  08/2017   benign mass   HEMORRHOID SURGERY N/A 06/24/2018   Procedure: HEMORRHOIDECTOMY;  Surgeon: Michael Boston, MD;  Location: [place];  Service: General;  Laterality: N/A;   KNEE ARTHROSCOPY Right    Menicus repair   ORIF CLAVICULAR FRACTURE Right 04/28/2015   Procedure: REVISION ORIF RIGHT CLAVICAL FRACTURE, ALLOGRAFT BONE GRAFTING;  Surgeon: Justice Britain, MD;  Location: [place];  Service: Orthopedics;  Laterality: Right;   Peridontal Laser Surgery  01/2019   due to infection, x 2   PLACEMENT OF BREAST IMPLANTS Bilateral    ROBOTIC ASSISTED TOTAL HYSTERECTOMY WITH BILATERAL SALPINGO OOPHERECTOMY Bilateral 07/10/2018   Procedure: XI ROBOTIC ASSISTED TOTAL HYSTERECTOMY WITH BILATERAL SALPINGO OOPHORECTOMY;  Surgeon: Everitt Amber, MD;  [facility];  Service: Gynecology;  Laterality: Bilateral;   SENTINEL NODE BIOPSY N/A 07/10/2018   Procedure: SENTINEL NODE BIOPSY, LEFT PELVIC NODE;  Surgeon: Everitt Amber, MD;  [facility];  Service: Gynecology;  Laterality: N/A;   SPHINCTEROTOMY N/A 06/24/2018   Procedure: ANAL SPHINCTEROTOMY, ANORECTAL EXAM UNDER ANESTHESIA;  Surgeon: Michael Boston, MD;  Location: [place];  Service: General;  Laterality: N/A;   TONSILLECTOMY     Patient Active  Problem List   Diagnosis Date Noted   Brain aneurysm 07/28/2021   Preop examination 07/07/2021   Eczema 07/07/2021   Myalgia 02/14/2021   Left leg pain 12/28/2020   Bilateral hearing loss 05/07/2020   Depression, major, single episode, moderate ([place]) 05/07/2020   Acute right-sided low back pain without sciatica 09/14/2019   Foul smelling urine 09/14/2019   Family history of colon cancer 02/23/2019   Family history of prostate cancer    Family history of brain cancer    Rash 01/13/2019   Hemorrhoids 08/01/2018   Anal fissure s/p internal sphincterotomy 06/24/2018 06/24/2018   Prolapsed internal hemorrhoids, grade 2&3, s/p ligation/pexy/hemorrhoidectomy 06/24/2018  06/24/2018   Endometrial cancer ([place]) 06/05/2018   Rectal tear 03/24/2018   Preventative health care 08/09/2017   Depression with anxiety 08/09/2017   Hyperlipidemia LDL goal <100 08/09/2017   Seasonal allergies 08/09/2017   Osteoarthritis of knee 05/29/2017   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   Hx of anxiety disorder 04/03/2017   Smoking greater than 30 pack years 04/03/2017   Emphysema of lung ([place]) 04/03/2017   Arteriosclerotic cardiovascular disease (ASCVD) 04/03/2017   Alcoholism in remission ([place]) 04/02/2017   History of transient ischemic attack (TIA) 09/27/2016   Transient cerebral ischemia 09/27/2016   Gastroesophageal reflux disease 09/27/2016   Generalized anxiety disorder 09/27/2016   Gait disturbance 09/23/2016   Leukocytosis 09/23/2016   Primary hypertension 09/23/2016   HLD (hyperlipidemia) 09/23/2016   Former smoker 09/23/2016   Paresthesias 09/22/2016   SINUSITIS- ACUTE-NOS 05/12/2008    PCP: [name], DO  REFERRING PROVIDER: Gaynelle Arabian, MD  REFERRING DIAG:  Diagnosis  [phone] (ICD-10-CM) - Presence of right artificial knee joint    THERAPY DIAG:  Chronic pain of right knee  Muscle weakness (generalized)  Localized edema  Difficulty in walking, not elsewhere classified  Rationale for Evaluation and Treatment: Rehabilitation  ONSET DATE: 04/11/22  SUBJECTIVE:   SUBJECTIVE STATEMENT: "I am doing good but not good enough to drive." "I feel good" having a hard time sleeping  PERTINENT HISTORY: S/P R TKR Brain aneurysm-clipped, OA R knee, scoliosis, leg length discrepancy, R shorter than L  PAIN:  Are you having pain? Yes: NPRS scale: 4/10 Pain location: R knee Pain description: sharp Aggravating factors: stiffness Relieving factors: Medication , ice  PRECAUTIONS: None  WEIGHT BEARING RESTRICTIONS: No  FALLS:  Has patient fallen in last 6 months? 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   PLOF: Independent  PATIENT GOALS: Recover fully, walk.  NEXT MD VISIT: 3 weeks  OBJECTIVE:   DIAGNOSTIC FINDINGS: N/A   COGNITION: Overall cognitive status: Within functional limits for tasks assessed     SENSATION: Not tested  EDEMA:  Expected swelling   POSTURE: weight shift left  PALPATION: R thigh was severely indurated above the ace wrap, painful to touch.  LOWER EXTREMITY ROM: WNL except where noted.  Passive ROM Right eval Left eval  Hip flexion    Hip extension    Hip abduction    Hip adduction    Hip internal rotation    Hip external rotation    Knee flexion 85   Knee extension 27   Ankle dorsiflexion    Ankle plantarflexion  Ankle inversion    Ankle eversion      LOWER EXTREMITY MMT: LLE 5  MMT Right eval Left eval  Hip flexion 3-   Hip extension    Hip abduction 3-   Hip adduction    Hip internal rotation    Hip external rotation    Knee flexion 2+   Knee extension 3-   Ankle dorsiflexion 4   Ankle plantarflexion    Ankle inversion    Ankle eversion      GAIT: Distance walked: In clinic distances Assistive device utilized: Walker - 2 wheeled Level of assistance: Modified independence Comments: Patient reports difficulty managing her 2 steps. Educated to correct technique, she has a handle to hold onto.Walked in with max reliance on BUE support, step to gait pattern with RLE slightly ER throughout gait cycle.   TODAY'S TREATMENT:                                                                                                                              DATE:  04/26/22 NuStep L5 x 6 min R knee PROM flex and ext with end range holds LAQ 2lb 3x10 RLE HS curls red  2x15 Gait w/ SPC 153f decrease R knee flex  Resisted gait 20lb x3 forward backwar 4 in step ups x5 each HHA x1   04/24/22 NuStep L5 x 6 min R knee PROM with end range holds S2S 2x10 Gait 1565fNo ad, decrease R knee flexion lateral trunk sway, Gait SPC [id number] press 20lb 2x10 LAQ RLE 3lb 3x10 Hamstring curls red RLE 2x10  04/19/21 R knee PROM  NuStep L5 x 5 min, L 1 x 1mi38mE only S2S 3x5 no UE for the 3rd set  LAQ RLE 2lb 2x10 Hamstring Curls RLE red 2x10 Quad sets RLE 2x10 Standing marches 3x5    04/17/22 Supine knee press 10 x 5 sec Heel slides with min A x 10 reps Short kick, 10 x 5 seconds SLR x 10 reps Long kicks/knee flexion in sitting x 10 Seated AAROM flex and hold with overpressure, contract relax. 85 degrees AA flexion. NuStep L3 x 6 minutes, legs only Gait training with RW, Mod VC and min TC at R hip to maintain upright posture and step through pattern.  04/13/22  Education Gait training-initiated step through gait training, with increased WB through RLE in cycle, improved with practice. Quad sets, SAQ, HS, SLR, seated long kicks and knee flexion R hip IR in supine. Educated to prevent passive hip ER when lying down.  PATIENT EDUCATION:  Education details: HEP, POC Person educated: Patient and Spouse Education method: Explanation, Demonstration, and Handouts Education comprehension: verbalized understanding and returned demonstration  HOME EXERCISE PROGRAM: [id number]:  CLINICAL IMPRESSION: Pt enters doing well. Pt reported that's he MD was pleased but he would like her to have more flexion. Some pain at the end range of passive flexion and ext. Some Muscle fatigue reported  with LAQ and HS curls. Cues with resisted gait for equal step length. Pt tolerated session well overall.   OBJECTIVE IMPAIRMENTS: Abnormal gait, decreased activity tolerance, decreased balance, decreased coordination, decreased mobility, difficulty walking, decreased ROM,  decreased strength, increased muscle spasms, impaired flexibility, postural dysfunction, and pain.   ACTIVITY LIMITATIONS: carrying, lifting, bending, sitting, standing, squatting, sleeping, stairs, transfers, bathing, toileting, and locomotion level  PARTICIPATION LIMITATIONS: meal prep, cleaning, laundry, driving, shopping, community activity, and occupation  PERSONAL FACTORS: Age and Past/current experiences are also affecting patient's functional outcome.   REHAB POTENTIAL: Good  CLINICAL DECISION MAKING: Evolving/moderate complexity  EVALUATION COMPLEXITY: Moderate   GOALS: Goals reviewed with patient? Yes  SHORT TERM GOALS: Target date: 05/03/22 I with initial HEP Baseline: Goal status: Met  LONG TERM GOALS: Target date: 07/06/22  I with final HEP Baseline:  Goal status: INITIAL  2.  Increase r knee ROM to <5-120 Baseline:  Goal status: INITIAL  3.  Increase R knee strength to at least 4+/5 Baseline:  Goal status: INITIAL  4.  Patient will be able to walk on level and unlevel surfaces, x at least 500', I, no pain Baseline:  Goal status: INITIAL  5.  Up and down at least 15 steps with UUE support, step over step each direction. Baseline:  Goal status: INITIAL   PLAN:  PT FREQUENCY: 2x/week  PT DURATION: 12 weeks  PLANNED INTERVENTIONS: Therapeutic exercises, Therapeutic activity, Neuromuscular re-education, Balance training, Gait training, Patient/Family education, Self Care, Joint mobilization, Stair training, Dry Needling, Electrical stimulation, Cryotherapy, Moist heat, Vasopneumatic device, Ionotophoresis '4mg'$ /ml Dexamethasone, and Manual therapy  PLAN FOR NEXT SESSION: Update HEP, progress gait, strength, and ROM training.   Marcelina Morel, DPT 04/26/2022, 10:17 AM

## 2022-05-01 ENCOUNTER — Ambulatory Visit: Payer: BC Managed Care – PPO | Admitting: Physical Therapy

## 2022-05-01 ENCOUNTER — Encounter: Payer: Self-pay | Admitting: Physical Therapy

## 2022-05-01 DIAGNOSIS — M25561 Pain in right knee: Secondary | ICD-10-CM

## 2022-05-01 DIAGNOSIS — R278 Other lack of coordination: Secondary | ICD-10-CM

## 2022-05-01 DIAGNOSIS — M6281 Muscle weakness (generalized): Secondary | ICD-10-CM

## 2022-05-01 DIAGNOSIS — G8929 Other chronic pain: Secondary | ICD-10-CM

## 2022-05-01 DIAGNOSIS — R600 Localized edema: Secondary | ICD-10-CM

## 2022-05-01 DIAGNOSIS — R262 Difficulty in walking, not elsewhere classified: Secondary | ICD-10-CM

## 2022-05-01 NOTE — Therapy (Signed)
OUTPATIENT PHYSICAL THERAPY LOWER EXTREMITY TREATMENT   Patient Name: Rebecca Gordon MRN: [id number] DOB:26-Jun-1955, 67 y.o., female Today's Date: 05/01/2022  END OF SESSION:  PT End of Session - 05/01/22 1017     Visit Number 6    Date for PT Re-Evaluation 07/06/22    PT Start Time 1011    PT Stop Time 1050    PT Time Calculation (min) 39 min    Activity Tolerance Patient tolerated treatment well;Patient limited by pain    Behavior During Therapy [facility] for tasks assessed/performed              Past Medical History:  Diagnosis Date   Alcoholism ([place])    Anal fissure    Aneurysm ([place]) 09/2016   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)    Grade I   Endometrial polyp    Family history of brain cancer    Family history of prostate cancer    Former smoker 09/23/2016   GERD (gastroesophageal reflux disease)    Nexium   H/O clavicle fracture 2017   Right   High cholesterol    History of bronchitis    History of pneumonia    years ago   Hypertension    LVH (left ventricular hypertrophy) 09/23/2016   Mild, noted on ECHO   Numbness    left fingers after TIA   OA (osteoarthritis)    "right knee"   Osteoporosis 08/2017   T score -2.7   Panic attacks    Postmenopausal bleeding    Thyroid nodule 2019   Multiple   TIA (transient ischemic attack) 09/2016   Wears contact lenses    Past Surgical History:  Procedure Laterality Date   CLAVICLE SURGERY Right 2016   fracture repair   COLONOSCOPY  2017   multiples   CRANIOTOMY FOR ANEURYSM / VERTEBROBASILAR / CAROTID CIRCULATION  09/05/2021   DILATATION & CURETTAGE/HYSTEROSCOPY WITH MYOSURE N/A 05/26/2018   Procedure: DILATATION & CURETTAGE/HYSTEROSCOPY WITH MYOSURE;  Surgeon: Princess Bruins, MD;  Location: [place];  Service: Gynecology;  Laterality: N/A;   EYE SURGERY Right    "growth on eye removed"   FOOT SURGERY Right  08/2017   benign mass   HEMORRHOID SURGERY N/A 06/24/2018   Procedure: HEMORRHOIDECTOMY;  Surgeon: Michael Boston, MD;  Location: [place];  Service: General;  Laterality: N/A;   KNEE ARTHROSCOPY Right    Menicus repair   ORIF CLAVICULAR FRACTURE Right 04/28/2015   Procedure: REVISION ORIF RIGHT CLAVICAL FRACTURE, ALLOGRAFT BONE GRAFTING;  Surgeon: Justice Britain, MD;  Location: [place];  Service: Orthopedics;  Laterality: Right;   Peridontal Laser Surgery  01/2019   due to infection, x 2   PLACEMENT OF BREAST IMPLANTS Bilateral    ROBOTIC ASSISTED TOTAL HYSTERECTOMY WITH BILATERAL SALPINGO OOPHERECTOMY Bilateral 07/10/2018   Procedure: XI ROBOTIC ASSISTED TOTAL HYSTERECTOMY WITH BILATERAL SALPINGO OOPHORECTOMY;  Surgeon: Everitt Amber, MD;  Location[facility];  Service: Gynecology;  Laterality: Bilateral;   SENTINEL NODE BIOPSY N/A 07/10/2018   Procedure: SENTINEL NODE BIOPSY, LEFT PELVIC NODE;  Surgeon: Everitt Amber, MD;  [facility];  Service: Gynecology;  Laterality: N/A;   SPHINCTEROTOMY N/A 06/24/2018   Procedure: ANAL SPHINCTEROTOMY, ANORECTAL EXAM UNDER ANESTHESIA;  Surgeon: Michael Boston, MD;  Location: [place];  Service: General;  Laterality: N/A;   TONSILLECTOMY     Patient  Active Problem List   Diagnosis Date Noted   Brain aneurysm 07/28/2021   Preop examination 07/07/2021   Eczema 07/07/2021   Myalgia 02/14/2021   Left leg pain 12/28/2020   Bilateral hearing loss 05/07/2020   Depression, major, single episode, moderate ([place]) 05/07/2020   Acute right-sided low back pain without sciatica 09/14/2019   Foul smelling urine 09/14/2019   Family history of colon cancer 02/23/2019   Family history of prostate cancer    Family history of brain cancer    Rash 01/13/2019   Hemorrhoids 08/01/2018   Anal fissure s/p internal sphincterotomy 06/24/2018 06/24/2018   Prolapsed internal hemorrhoids, grade 2&3, s/p ligation/pexy/hemorrhoidectomy 06/24/2018  06/24/2018   Endometrial cancer ([place]) 06/05/2018   Rectal tear 03/24/2018   Preventative health care 08/09/2017   Depression with anxiety 08/09/2017   Hyperlipidemia LDL goal <100 08/09/2017   Seasonal allergies 08/09/2017   Osteoarthritis of knee 05/29/2017   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/2017   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   Hx of anxiety disorder 04/03/2017   Smoking greater than 30 pack years 04/03/2017   Emphysema of lung ([place]) 04/03/2017   Arteriosclerotic cardiovascular disease (ASCVD) 04/03/2017   Alcoholism in remission ([place]) 04/02/2017   History of transient ischemic attack (TIA) 09/27/2016   Transient cerebral ischemia 09/27/2016   Gastroesophageal reflux disease 09/27/2016   Generalized anxiety disorder 09/27/2016   Gait disturbance 09/23/2016   Leukocytosis 09/23/2016   Primary hypertension 09/23/2016   HLD (hyperlipidemia) 09/23/2016   Former smoker 09/23/2016   Paresthesias 09/22/2016   SINUSITIS- ACUTE-NOS 05/12/2008    PCP: [name], DO  REFERRING PROVIDER: Gaynelle Arabian, MD  REFERRING DIAG:  Diagnosis  [phone] (ICD-10-CM) - Presence of right artificial knee joint    THERAPY DIAG:  Muscle weakness (generalized)  Localized edema  Other lack of coordination  Difficulty in walking, not elsewhere classified  Acute pain of right knee  Chronic pain of right knee  Rationale for Evaluation and Treatment: Rehabilitation  ONSET DATE: 04/11/22  SUBJECTIVE:   SUBJECTIVE STATEMENT: "I am doing good but not good enough to drive." "I feel good" having a hard time sleeping  PERTINENT HISTORY: S/P R TKR Brain aneurysm-clipped, OA R knee, scoliosis, leg length discrepancy, R shorter than L  PAIN:  Are you having pain? Yes: NPRS scale: 4/10 Pain location: R knee Pain description: sharp Aggravating factors: stiffness Relieving factors: Medication , ice  PRECAUTIONS: None  WEIGHT BEARING RESTRICTIONS:  No  FALLS:  Has patient fallen in last 6 months? 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   PLOF: Independent  PATIENT GOALS: Recover fully, walk.  NEXT MD VISIT: 3 weeks  OBJECTIVE:   DIAGNOSTIC FINDINGS: N/A   COGNITION: Overall cognitive status: Within functional limits for tasks assessed     SENSATION: Not tested  EDEMA:  Expected swelling   POSTURE: weight shift left  PALPATION: R thigh was severely indurated above the ace wrap, painful to touch.  LOWER EXTREMITY ROM: WNL except where noted.  Passive ROM Right eval Left eval  Hip flexion    Hip extension    Hip abduction    Hip adduction    Hip internal rotation    Hip external rotation    Knee flexion 85   Knee extension 27  Ankle dorsiflexion    Ankle plantarflexion    Ankle inversion    Ankle eversion      LOWER EXTREMITY MMT: LLE 5  MMT Right eval Left eval  Hip flexion 3-   Hip extension    Hip abduction 3-   Hip adduction    Hip internal rotation    Hip external rotation    Knee flexion 2+   Knee extension 3-   Ankle dorsiflexion 4   Ankle plantarflexion    Ankle inversion    Ankle eversion      GAIT: Distance walked: In clinic distances Assistive device utilized: Walker - 2 wheeled Level of assistance: Modified independence Comments: Patient reports difficulty managing her 2 steps. Educated to correct technique, she has a handle to hold onto.Walked in with max reliance on BUE support, step to gait pattern with RLE slightly ER throughout gait cycle.   TODAY'S TREATMENT:                                                                                                                              DATE:  05/01/22 Bike partial revolutions x 6  minutes Supine R knee ext with heel elevated, A, then AAROM 3 x 5 sec,  Seated knee flex with foot planted, then contract/relax R knee AAROM 12-92 STM to lower leg to move fluid out of the tissue. Seated knee flex and ext against Red Tband, x 10 each Step ups onto 6" step with BUE support, on R, 10 forward, 10 lateral Standing knee flexion x 10 for HS strength  04/26/22 NuStep L5 x 6 min R knee PROM flex and ext with end range holds LAQ 2lb 3x10 RLE HS curls red 2x15 Gait w/ SPC 161f decrease R knee flex  Resisted gait 20lb x3 forward backwar 4 in step ups x5 each HHA x1   04/24/22 NuStep L5 x 6 min R knee PROM with end range holds S2S 2x10 Gait 1510fNo ad, decrease R knee flexion lateral trunk sway, Gait SPC [id number] press 20lb 2x10 LAQ RLE 3lb 3x10 Hamstring curls red RLE 2x10  04/19/21 R knee PROM  NuStep L5 x 5 min, L 1 x 1mi77mE only S2S 3x5 no UE for the 3rd set  LAQ RLE 2lb 2x10 Hamstring Curls RLE red 2x10 Quad sets RLE 2x10 Standing marches 3x5    04/17/22 Supine knee press 10 x 5 sec Heel slides with min A x 10 reps Short kick, 10 x 5 seconds SLR x 10 reps Long kicks/knee flexion in sitting x 10 Seated AAROM flex and hold with overpressure, contract relax. 85 degrees AA flexion. NuStep L3 x 6 minutes, legs only Gait training with RW, Mod VC and min TC at R hip to maintain upright posture and step through pattern.  04/13/22  Education Gait training-initiated step through gait training, with increased WB through RLE in cycle, improved with practice. Quad sets, SAQ, HS, SLR, seated long  kicks and knee flexion R hip IR in supine. Educated to prevent passive hip ER when lying down.  PATIENT EDUCATION:  Education details: HEP, POC Person educated: Patient and Spouse Education method: Explanation, Demonstration, and Handouts Education comprehension: verbalized understanding and returned demonstration  HOME EXERCISE  PROGRAM: [id number]  ASSESSMENT:  CLINICAL IMPRESSION: Pt reports TTP in R lower leg, STM followed by stretch for both knee ext and flex. Updated Hep with progressive strengthening as some of her initial exercises are getting too easy. Her husband is having surgery next Tuesday and she will be [place] a lot of [place].   OBJECTIVE IMPAIRMENTS: Abnormal gait, decreased activity tolerance, decreased balance, decreased coordination, decreased mobility, difficulty walking, decreased ROM, decreased strength, increased muscle spasms, impaired flexibility, postural dysfunction, and pain.   ACTIVITY LIMITATIONS: carrying, lifting, bending, sitting, standing, squatting, sleeping, stairs, transfers, bathing, toileting, and locomotion level  PARTICIPATION LIMITATIONS: meal prep, cleaning, laundry, driving, shopping, community activity, and occupation  PERSONAL FACTORS: Age and Past/current experiences are also affecting patient's functional outcome.   REHAB POTENTIAL: Good  CLINICAL DECISION MAKING: Evolving/moderate complexity  EVALUATION COMPLEXITY: Moderate   GOALS: Goals reviewed with patient? Yes  SHORT TERM GOALS: Target date: 05/03/22 I with initial HEP Baseline: Goal status: Met  LONG TERM GOALS: Target date: 07/06/22  I with final HEP Baseline:  Goal status: INITIAL  2.  Increase r knee ROM to <5-120 Baseline:  Goal status: ongoing  3.  Increase R knee strength to at least 4+/5 Baseline:  Goal status: ongoing  4.  Patient will be able to walk on level and unlevel surfaces, x at least 500', I, no pain Baseline:  Goal status: INITIAL  5.  Up and down at least 15 steps with UUE support, step over step each direction. Baseline:  Goal status: INITIAL   PLAN:  PT FREQUENCY: 2x/week  PT DURATION: 12 weeks  PLANNED INTERVENTIONS: Therapeutic exercises, Therapeutic activity, Neuromuscular re-education, Balance training, Gait training, Patient/Family  education, Self Care, Joint mobilization, Stair training, Dry Needling, Electrical stimulation, Cryotherapy, Moist heat, Vasopneumatic device, Ionotophoresis '4mg'$ /ml Dexamethasone, and Manual therapy  PLAN FOR NEXT SESSION: Update HEP, progress gait, strength, and ROM training.   Marcelina Morel, DPT 05/01/2022, 11:08 AM

## 2022-05-03 ENCOUNTER — Ambulatory Visit: Payer: BC Managed Care – PPO | Admitting: Physical Therapy

## 2022-05-03 ENCOUNTER — Encounter: Payer: Self-pay | Admitting: Physical Therapy

## 2022-05-03 DIAGNOSIS — R278 Other lack of coordination: Secondary | ICD-10-CM

## 2022-05-03 DIAGNOSIS — M25561 Pain in right knee: Secondary | ICD-10-CM | POA: Diagnosis not present

## 2022-05-03 DIAGNOSIS — R262 Difficulty in walking, not elsewhere classified: Secondary | ICD-10-CM

## 2022-05-03 DIAGNOSIS — R600 Localized edema: Secondary | ICD-10-CM

## 2022-05-03 DIAGNOSIS — M6281 Muscle weakness (generalized): Secondary | ICD-10-CM

## 2022-05-03 DIAGNOSIS — G8929 Other chronic pain: Secondary | ICD-10-CM

## 2022-05-03 NOTE — Therapy (Signed)
OUTPATIENT PHYSICAL THERAPY LOWER EXTREMITY TREATMENT   Patient Name: Rebecca Gordon MRN: [id number] DOB:February 14, 1956, 67 y.o., female Today's Date: 05/03/2022  END OF SESSION:  PT End of Session - 05/03/22 1018     Visit Number 7    Date for PT Re-Evaluation 07/06/22    PT Start Time 1015    PT Stop Time 1055    PT Time Calculation (min) 40 min    Activity Tolerance Patient tolerated treatment well;Patient limited by pain    Behavior During Therapy [facility] for tasks assessed/performed              Past Medical History:  Diagnosis Date   Alcoholism ([place])    Anal fissure    Aneurysm ([place]) 09/2016   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   Anxiety    Colon polyps    Depression    Endometrial cancer ([place])    Grade I   Endometrial polyp    Family history of brain cancer    Family history of prostate cancer    Former smoker 09/23/2016   GERD (gastroesophageal reflux disease)    Nexium   H/O clavicle fracture 2017   Right   High cholesterol    History of bronchitis    History of pneumonia    years ago   Hypertension    LVH (left ventricular hypertrophy) 09/23/2016   Mild, noted on ECHO   Numbness    left fingers after TIA   OA (osteoarthritis)    "right knee"   Osteoporosis 08/2017   T score -2.7   Panic attacks    Postmenopausal bleeding    Thyroid nodule 2019   Multiple   TIA (transient ischemic attack) 09/2016   Wears contact lenses    Past Surgical History:  Procedure Laterality Date   CLAVICLE SURGERY Right 2016   fracture repair   COLONOSCOPY  2017   multiples   CRANIOTOMY FOR ANEURYSM / VERTEBROBASILAR / CAROTID CIRCULATION  09/05/2021   DILATATION & CURETTAGE/HYSTEROSCOPY WITH MYOSURE N/A 05/26/2018   Procedure: DILATATION & CURETTAGE/HYSTEROSCOPY WITH MYOSURE;  Surgeon: Princess Bruins, MD;  Location: [place];  Service: Gynecology;  Laterality: N/A;   EYE SURGERY Right    "growth on eye removed"   FOOT SURGERY Right  08/2017   benign mass   HEMORRHOID SURGERY N/A 06/24/2018   Procedure: HEMORRHOIDECTOMY;  Surgeon: Michael Boston, MD;  Location: [place];  Service: General;  Laterality: N/A;   KNEE ARTHROSCOPY Right    Menicus repair   ORIF CLAVICULAR FRACTURE Right 04/28/2015   Procedure: REVISION ORIF RIGHT CLAVICAL FRACTURE, ALLOGRAFT BONE GRAFTING;  Surgeon: Justice Britain, MD;  Location: [place];  Service: Orthopedics;  Laterality: Right;   Peridontal Laser Surgery  01/2019   due to infection, x 2   PLACEMENT OF BREAST IMPLANTS Bilateral    ROBOTIC ASSISTED TOTAL HYSTERECTOMY WITH BILATERAL SALPINGO OOPHERECTOMY Bilateral 07/10/2018   Procedure: XI ROBOTIC ASSISTED TOTAL HYSTERECTOMY WITH BILATERAL SALPINGO OOPHORECTOMY;  Surgeon: Everitt Amber, MD;  [facility];  Service: Gynecology;  Laterality: Bilateral;   SENTINEL NODE BIOPSY N/A 07/10/2018   Procedure: SENTINEL NODE BIOPSY, LEFT PELVIC NODE;  Surgeon: Everitt Amber, MD;  [facility];  Service: Gynecology;  Laterality: N/A;   SPHINCTEROTOMY N/A 06/24/2018   Procedure: ANAL SPHINCTEROTOMY, ANORECTAL EXAM UNDER ANESTHESIA;  Surgeon: Michael Boston, MD;  Location: [place];  Service: General;  Laterality: N/A;   TONSILLECTOMY     Patient  Active Problem List   Diagnosis Date Noted   Brain aneurysm 07/28/2021   Preop examination 07/07/2021   Eczema 07/07/2021   Myalgia 02/14/2021   Left leg pain 12/28/2020   Bilateral hearing loss 05/07/2020   Depression, major, single episode, moderate ([place]) 05/07/2020   Acute right-sided low back pain without sciatica 09/14/2019   Foul smelling urine 09/14/2019   Family history of colon cancer 02/23/2019   Family history of prostate cancer    Family history of brain cancer    Rash 01/13/2019   Hemorrhoids 08/01/2018   Anal fissure s/p internal sphincterotomy 06/24/2018 06/24/2018   Prolapsed internal hemorrhoids, grade 2&3, s/p ligation/pexy/hemorrhoidectomy 06/24/2018  06/24/2018   Endometrial cancer ([place]) 06/05/2018   Rectal tear 03/24/2018   Preventative health care 08/09/2017   Depression with anxiety 08/09/2017   Hyperlipidemia LDL goal <100 08/09/2017   Seasonal allergies 08/09/2017   Osteoarthritis of knee 05/29/2017   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   Hx of anxiety disorder 04/03/2017   Smoking greater than 30 pack years 04/03/2017   Emphysema of lung ([place]) 04/03/2017   Arteriosclerotic cardiovascular disease (ASCVD) 04/03/2017   Alcoholism in remission ([place]) 04/02/2017   History of transient ischemic attack (TIA) 09/27/2016   Transient cerebral ischemia 09/27/2016   Gastroesophageal reflux disease 09/27/2016   Generalized anxiety disorder 09/27/2016   Gait disturbance 09/23/2016   Leukocytosis 09/23/2016   Primary hypertension 09/23/2016   HLD (hyperlipidemia) 09/23/2016   Former smoker 09/23/2016   Paresthesias 09/22/2016   SINUSITIS- ACUTE-NOS 05/12/2008    PCP: [name], DO  REFERRING PROVIDER: Gaynelle Arabian, MD  REFERRING DIAG:  Diagnosis  [phone] (ICD-10-CM) - Presence of right artificial knee joint    THERAPY DIAG:  Muscle weakness (generalized)  Localized edema  Other lack of coordination  Chronic pain of right knee  Acute pain of right knee  Difficulty in walking, not elsewhere classified  Rationale for Evaluation and Treatment: Rehabilitation  ONSET DATE: 04/11/22  SUBJECTIVE:   SUBJECTIVE STATEMENT: "I am doing good but not good enough to drive." "I feel good" having a hard time sleeping  PERTINENT HISTORY: S/P R TKR Brain aneurysm-clipped, OA R knee, scoliosis, leg length discrepancy, R shorter than L  PAIN:  Are you having pain? Yes: NPRS scale: 4/10 Pain location: R knee Pain description: sharp Aggravating factors: stiffness Relieving factors: Medication , ice  PRECAUTIONS: None  WEIGHT BEARING RESTRICTIONS:  No  FALLS:  Has patient fallen in last 6 months? 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   PLOF: Independent  PATIENT GOALS: Recover fully, walk.  NEXT MD VISIT: 3 weeks  OBJECTIVE:   DIAGNOSTIC FINDINGS: N/A   COGNITION: Overall cognitive status: Within functional limits for tasks assessed     SENSATION: Not tested  EDEMA:  Expected swelling   POSTURE: weight shift left  PALPATION: R thigh was severely indurated above the ace wrap, painful to touch.  LOWER EXTREMITY ROM: WNL except where noted.  Passive ROM Right eval Left eval  Hip flexion    Hip extension    Hip abduction    Hip adduction    Hip internal rotation    Hip external rotation    Knee flexion 85   Knee extension 27  Ankle dorsiflexion    Ankle plantarflexion    Ankle inversion    Ankle eversion      LOWER EXTREMITY MMT: LLE 5  MMT Right eval Left eval  Hip flexion 3-   Hip extension    Hip abduction 3-   Hip adduction    Hip internal rotation    Hip external rotation    Knee flexion 2+   Knee extension 3-   Ankle dorsiflexion 4   Ankle plantarflexion    Ankle inversion    Ankle eversion      GAIT: Distance walked: In clinic distances Assistive device utilized: Walker - 2 wheeled Level of assistance: Modified independence Comments: Patient reports difficulty managing her 2 steps. Educated to correct technique, she has a handle to hold onto.Walked in with max reliance on BUE support, step to gait pattern with RLE slightly ER throughout gait cycle.   TODAY'S TREATMENT:                                                                                                                              DATE:  05/03/22 NuStep L5 x 6 minutes STM to R knee  for quads, HS, calf, fluid  Stretch for knee ext, AROM and AAROM Seated knee flex A and AAROM, 94 flex Seated knee flex against 15#, BLE x 10, RLE 10# 10 reps Seated knee ext BLE 10# 10 reps, R eccentric ext x 10, 5# Amulation focused on normalized gait. Still tends to demonstrate increased L lateral weight shift and decreased WB through R, but improved.  05/01/22 Bike partial revolutions x 6 minutes Supine R knee ext with heel elevated, A, then AAROM 3 x 5 sec,  Seated knee flex with foot planted, then contract/relax R knee AAROM 12-92 STM to lower leg to move fluid out of the tissue. Seated knee flex and ext against Red Tband, x 10 each Step ups onto 6" step with BUE support, on R, 10 forward, 10 lateral Standing knee flexion x 10 for HS strength  04/26/22 NuStep L5 x 6 min R knee PROM flex and ext with end range holds LAQ 2lb 3x10 RLE HS curls red 2x15 Gait w/ SPC 126f decrease R knee flex  Resisted gait 20lb x3 forward backwar 4 in step ups x5 each HHA x1   04/24/22 NuStep L5 x 6 min R knee PROM with end range holds S2S 2x10 Gait 1569fNo ad, decrease R knee flexion lateral trunk sway, Gait SPC [id number] press 20lb 2x10 LAQ RLE 3lb 3x10 Hamstring curls red RLE 2x10  04/19/21 R knee PROM  NuStep L5 x 5 min, L 1 x 1mi35mE only S2S 3x5 no UE for the 3rd set  LAQ RLE 2lb 2x10 Hamstring Curls RLE red 2x10 Quad sets RLE 2x10 Standing marches 3x5    04/17/22 Supine knee press 10 x 5 sec Heel slides with min A x 10 reps Short kick, 10 x 5 seconds  SLR x 10 reps Long kicks/knee flexion in sitting x 10 Seated AAROM flex and hold with overpressure, contract relax. 85 degrees AA flexion. NuStep L3 x 6 minutes, legs only Gait training with RW, Mod VC and min TC at R hip to maintain upright posture and step through pattern.  04/13/22  Education Gait training-initiated step through gait training, with increased WB through RLE in cycle, improved with practice. Quad sets, SAQ,  HS, SLR, seated long kicks and knee flexion R hip IR in supine. Educated to prevent passive hip ER when lying down.  PATIENT EDUCATION:  Education details: HEP, POC Person educated: Patient and Spouse Education method: Explanation, Demonstration, and Handouts Education comprehension: verbalized understanding and returned demonstration  HOME EXERCISE PROGRAM: [id number]  ASSESSMENT:  CLINICAL IMPRESSION: Treatment emphasized ROM in her knee, with education provided to increase stretching at home. Finished with some isolated HS and quad strengthening as well. Tolerated extensive stretching well, but very painful.   OBJECTIVE IMPAIRMENTS: Abnormal gait, decreased activity tolerance, decreased balance, decreased coordination, decreased mobility, difficulty walking, decreased ROM, decreased strength, increased muscle spasms, impaired flexibility, postural dysfunction, and pain.   ACTIVITY LIMITATIONS: carrying, lifting, bending, sitting, standing, squatting, sleeping, stairs, transfers, bathing, toileting, and locomotion level  PARTICIPATION LIMITATIONS: meal prep, cleaning, laundry, driving, shopping, community activity, and occupation  PERSONAL FACTORS: Age and Past/current experiences are also affecting patient's functional outcome.   REHAB POTENTIAL: Good  CLINICAL DECISION MAKING: Evolving/moderate complexity  EVALUATION COMPLEXITY: Moderate   GOALS: Goals reviewed with patient? Yes  SHORT TERM GOALS: Target date: 05/03/22 I with initial HEP Baseline: Goal status: Met  LONG TERM GOALS: Target date: 07/06/22  I with final HEP Baseline:  Goal status: INITIAL  2.  Increase r knee ROM to <5-120 Baseline:  Goal status: ongoing  3.  Increase R knee strength to at least 4+/5 Baseline:  Goal status: ongoing  4.  Patient will be able to walk on level and unlevel surfaces, x at least 500', I, no pain Baseline:  Goal status: INITIAL  5.  Up and down at least 15 steps with  UUE support, step over step each direction. Baseline:  Goal status: INITIAL   PLAN:  PT FREQUENCY: 2x/week  PT DURATION: 12 weeks  PLANNED INTERVENTIONS: Therapeutic exercises, Therapeutic activity, Neuromuscular re-education, Balance training, Gait training, Patient/Family education, Self Care, Joint mobilization, Stair training, Dry Needling, Electrical stimulation, Cryotherapy, Moist heat, Vasopneumatic device, Ionotophoresis '4mg'$ /ml Dexamethasone, and Manual therapy  PLAN FOR NEXT SESSION: Update HEP, progress gait, strength, and ROM training.   Marcelina Morel, DPT 05/03/2022, 11:31 AM

## 2022-05-07 ENCOUNTER — Ambulatory Visit: Payer: BC Managed Care – PPO | Admitting: Physical Therapy

## 2022-05-07 ENCOUNTER — Encounter: Payer: Self-pay | Admitting: Physical Therapy

## 2022-05-07 DIAGNOSIS — R600 Localized edema: Secondary | ICD-10-CM

## 2022-05-07 DIAGNOSIS — M25561 Pain in right knee: Secondary | ICD-10-CM | POA: Diagnosis not present

## 2022-05-07 DIAGNOSIS — R262 Difficulty in walking, not elsewhere classified: Secondary | ICD-10-CM

## 2022-05-07 DIAGNOSIS — G8929 Other chronic pain: Secondary | ICD-10-CM

## 2022-05-07 DIAGNOSIS — M6281 Muscle weakness (generalized): Secondary | ICD-10-CM

## 2022-05-07 DIAGNOSIS — R278 Other lack of coordination: Secondary | ICD-10-CM

## 2022-05-07 NOTE — Therapy (Signed)
OUTPATIENT PHYSICAL THERAPY LOWER EXTREMITY TREATMENT   Patient Name: Rebecca Gordon MRN: [id number] DOB:05-Nov-1955, 67 y.o., female Today's Date: 05/07/2022  END OF SESSION:  PT End of Session - 05/07/22 1146     Visit Number 8    Date for PT Re-Evaluation 07/06/22    PT Start Time 1146    PT Stop Time 4403    PT Time Calculation (min) 44 min    Activity Tolerance Patient tolerated treatment well;Patient limited by pain    Behavior During Therapy [facility] for tasks assessed/performed             Past Medical History:  Diagnosis Date   Alcoholism ([place])    Anal fissure    Aneurysm ([place]) 09/2016   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   Anxiety    Colon polyps    Depression    Endometrial cancer ([place])    Grade I   Endometrial polyp    Family history of brain cancer    Family history of prostate cancer    Former smoker 09/23/2016   GERD (gastroesophageal reflux disease)    Nexium   H/O clavicle fracture 2017   Right   High cholesterol    History of bronchitis    History of pneumonia    years ago   Hypertension    LVH (left ventricular hypertrophy) 09/23/2016   Mild, noted on ECHO   Numbness    left fingers after TIA   OA (osteoarthritis)    "right knee"   Osteoporosis 08/2017   T score -2.7   Panic attacks    Postmenopausal bleeding    Thyroid nodule 2019   Multiple   TIA (transient ischemic attack) 09/2016   Wears contact lenses    Past Surgical History:  Procedure Laterality Date   CLAVICLE SURGERY Right 2016   fracture repair   COLONOSCOPY  2017   multiples   CRANIOTOMY FOR ANEURYSM / VERTEBROBASILAR / CAROTID CIRCULATION  09/05/2021   DILATATION & CURETTAGE/HYSTEROSCOPY WITH MYOSURE N/A 05/26/2018   Procedure: DILATATION & CURETTAGE/HYSTEROSCOPY WITH MYOSURE;  Surgeon: Princess Bruins, MD;  Location: [place];  Service: Gynecology;  Laterality: N/A;   EYE SURGERY Right    "growth on eye removed"   FOOT SURGERY Right  08/2017   benign mass   HEMORRHOID SURGERY N/A 06/24/2018   Procedure: HEMORRHOIDECTOMY;  Surgeon: Michael Boston, MD;  Location: [place];  Service: General;  Laterality: N/A;   KNEE ARTHROSCOPY Right    Menicus repair   ORIF CLAVICULAR FRACTURE Right 04/28/2015   Procedure: REVISION ORIF RIGHT CLAVICAL FRACTURE, ALLOGRAFT BONE GRAFTING;  Surgeon: Justice Britain, MD;  Location: [place];  Service: Orthopedics;  Laterality: Right;   Peridontal Laser Surgery  01/2019   due to infection, x 2   PLACEMENT OF BREAST IMPLANTS Bilateral    ROBOTIC ASSISTED TOTAL HYSTERECTOMY WITH BILATERAL SALPINGO OOPHERECTOMY Bilateral 07/10/2018   Procedure: XI ROBOTIC ASSISTED TOTAL HYSTERECTOMY WITH BILATERAL SALPINGO OOPHORECTOMY;  Surgeon: Everitt Amber, MD;  Location: [facility];  Service: Gynecology;  Laterality: Bilateral;   SENTINEL NODE BIOPSY N/A 07/10/2018   Procedure: SENTINEL NODE BIOPSY, LEFT PELVIC NODE;  Surgeon: Everitt Amber, MD;  Location: [facility];  Service: Gynecology;  Laterality: N/A;   SPHINCTEROTOMY N/A 06/24/2018   Procedure: ANAL SPHINCTEROTOMY, ANORECTAL EXAM UNDER ANESTHESIA;  Surgeon: Michael Boston, MD;  Location: [place];  Service: General;  Laterality: N/A;   TONSILLECTOMY     Patient Active  Problem List   Diagnosis Date Noted   Brain aneurysm 07/28/2021   Preop examination 07/07/2021   Eczema 07/07/2021   Myalgia 02/14/2021   Left leg pain 12/28/2020   Bilateral hearing loss 05/07/2020   Depression, major, single episode, moderate ([place]) 05/07/2020   Acute right-sided low back pain without sciatica 09/14/2019   Foul smelling urine 09/14/2019   Family history of colon cancer 02/23/2019   Family history of prostate cancer    Family history of brain cancer    Rash 01/13/2019   Hemorrhoids 08/01/2018   Anal fissure s/p internal sphincterotomy 06/24/2018 06/24/2018   Prolapsed internal hemorrhoids, grade 2&3, s/p ligation/pexy/hemorrhoidectomy 06/24/2018  06/24/2018   Endometrial cancer ([place]) 06/05/2018   Rectal tear 03/24/2018   Preventative health care 08/09/2017   Depression with anxiety 08/09/2017   Hyperlipidemia LDL goal <100 08/09/2017   Seasonal allergies 08/09/2017   Osteoarthritis of knee 05/29/2017   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/03/2017   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   Hx of anxiety disorder 04/03/2017   Smoking greater than 30 pack years 04/03/2017   Emphysema of lung ([place]) 04/03/2017   Arteriosclerotic cardiovascular disease (ASCVD) 04/03/2017   Alcoholism in remission ([place]) 04/02/2017   History of transient ischemic attack (TIA) 09/27/2016   Transient cerebral ischemia 09/27/2016   Gastroesophageal reflux disease 09/27/2016   Generalized anxiety disorder 09/27/2016   Gait disturbance 09/23/2016   Leukocytosis 09/23/2016   Primary hypertension 09/23/2016   HLD (hyperlipidemia) 09/23/2016   Former smoker 09/23/2016   Paresthesias 09/22/2016   SINUSITIS- ACUTE-NOS 05/12/2008    PCP: [name], DO  REFERRING PROVIDER: Gaynelle Arabian, MD  REFERRING DIAG:  Diagnosis  [phone] (ICD-10-CM) - Presence of right artificial knee joint    THERAPY DIAG:  Muscle weakness (generalized)  Localized edema  Other lack of coordination  Chronic pain of right knee  Acute pain of right knee  Difficulty in walking, not elsewhere classified  Rationale for Evaluation and Treatment: Rehabilitation  ONSET DATE: 04/11/22  SUBJECTIVE:   SUBJECTIVE STATEMENT: Pt states she pulled her quad muscle while showing her husband she can do step over step on stairs. Pt reports her husband is getting surgery tomorrow. Sister is in town to help since she is still not able to drive due to knee flex <105 deg.  PERTINENT HISTORY: S/P R TKR Brain aneurysm-clipped, OA R knee, scoliosis, leg length discrepancy, R shorter than L  PAIN:  Are you having pain? Yes: NPRS scale: 4/10 Pain location: R  knee Pain description: sharp Aggravating factors: stiffness Relieving factors: Medication , ice  PRECAUTIONS: None  WEIGHT BEARING RESTRICTIONS: No  FALLS:  Has patient fallen in last 6 months? 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   PLOF: Independent  PATIENT GOALS: Recover fully, walk.  NEXT MD VISIT: 3 weeks  OBJECTIVE:   DIAGNOSTIC FINDINGS: N/A   COGNITION: Overall cognitive status: Within functional limits for tasks assessed     SENSATION: Not tested  EDEMA:  Expected swelling   POSTURE: weight shift left  PALPATION: R thigh was severely indurated above the ace wrap, painful to touch.  LOWER EXTREMITY ROM: WNL except where noted.  Passive ROM Right eval Left eval Right 05/07/22  Hip flexion     Hip extension  Hip abduction     Hip adduction     Hip internal rotation     Hip external rotation     Knee flexion 85  97  Knee extension 27  -12  Ankle dorsiflexion     Ankle plantarflexion     Ankle inversion     Ankle eversion       LOWER EXTREMITY MMT: LLE 5  MMT Right eval Left eval  Hip flexion 3-   Hip extension    Hip abduction 3-   Hip adduction    Hip internal rotation    Hip external rotation    Knee flexion 2+   Knee extension 3-   Ankle dorsiflexion 4   Ankle plantarflexion    Ankle inversion    Ankle eversion      GAIT: Distance walked: In clinic distances Assistive device utilized: Walker - 2 wheeled Level of assistance: Modified independence Comments: Patient reports difficulty managing her 2 steps. Educated to correct technique, she has a handle to hold onto.Walked in with max reliance on BUE support, step to gait pattern with RLE slightly ER throughout gait cycle.   TODAY'S  TREATMENT:                                                                                                                              DATE:  05/07/22 NuStep L5 x 6 minutes STM R quad Hamstring stretch 2x30 sec Prone quad stretch with strap 3x30 sec Prone hamstring curl 2x10 Prone hip ext 2x10 Supine hip flexor/quad stretch x30 sec Supine heel slide with strap x10 STM & TPR R quad Seated knee flexion AROM/PROM 3x30 sec with manual over pressure  Seated knee ext AROM/PROM 10x3 sec with manual over pressure Amb x 2 laps around gym -- cues for decreasing wide BOS, knee ext with WB and toe off + knee flexion  05/03/22 NuStep L5 x 6 minutes STM to R knee for quads, HS, calf, fluid  Stretch for knee ext, AROM and AAROM Seated knee flex A and AAROM, 94 flex Seated knee flex against 15#, BLE x 10, RLE 10# 10 reps Seated knee ext BLE 10# 10 reps, R eccentric ext x 10, 5# Amulation focused on normalized gait. Still tends to demonstrate increased L lateral weight shift and decreased WB through R, but improved.  05/01/22 Bike partial revolutions x 6 minutes Supine R knee ext with heel elevated, A, then AAROM 3 x 5 sec,  Seated knee flex with foot planted, then contract/relax R knee AAROM 12-92 STM to lower leg to move fluid out of the tissue. Seated knee flex and ext against Red Tband, x 10 each Step ups onto 6" step with BUE support, on R, 10 forward, 10 lateral Standing knee flexion x 10 for HS strength  04/26/22 NuStep L5 x 6 min R knee PROM flex and ext with end range holds LAQ 2lb 3x10 RLE HS curls red 2x15 Gait w/ SPC  117f decrease R knee flex  Resisted gait 20lb x3 forward backwar 4 in step ups x5 each HHA x1   04/24/22 NuStep L5 x 6 min R knee PROM with end range holds S2S 2x10 Gait [id number] ad, decrease R knee flexion lateral trunk sway, Gait SPC [id number] press 20lb 2x10 LAQ RLE 3lb 3x10 Hamstring curls red RLE 2x10  04/19/21 R knee PROM  NuStep L5 x 5 min, L 1 x [id number] only S2S 3x5 no UE for the 3rd set  LAQ RLE 2lb 2x10 Hamstring Curls RLE red 2x10 Quad sets RLE 2x10 Standing marches 3x5    04/17/22 Supine knee press 10 x 5 sec Heel slides with min A x 10 reps Short kick, 10 x 5 seconds SLR x 10 reps Long kicks/knee flexion in sitting x 10 Seated AAROM flex and hold with overpressure, contract relax. 85 degrees AA flexion. NuStep L3 x 6 minutes, legs only Gait training with RW, Mod VC and min TC at R hip to maintain upright posture and step through pattern.  04/13/22  Education Gait training-initiated step through gait training, with increased WB through RLE in cycle, improved with practice. Quad sets, SAQ, HS, SLR, seated long kicks and knee flexion R hip IR in supine. Educated to prevent passive hip ER when lying down.  PATIENT EDUCATION:  Education details: HEP, POC Person educated: Patient and Spouse Education method: Explanation, Demonstration, and Handouts Education comprehension: verbalized understanding and returned demonstration  HOME EXERCISE PROGRAM: [id number]:  CLINICAL IMPRESSION: Continued to work on improving knee ROM. Biggest goal is to reach 105 deg to be cleared for return to driving. Very tight quad -- address with stretching and manual work.    OBJECTIVE IMPAIRMENTS: Abnormal gait, decreased activity tolerance, decreased balance, decreased coordination, decreased mobility, difficulty walking, decreased ROM, decreased strength, increased muscle spasms, impaired flexibility, postural dysfunction, and pain.   ACTIVITY LIMITATIONS: carrying, lifting, bending, sitting, standing, squatting, sleeping, stairs, transfers, bathing, toileting, and locomotion level  PARTICIPATION LIMITATIONS: meal prep, cleaning, laundry, driving, shopping, community activity, and occupation  PERSONAL FACTORS: Age and Past/current experiences are also affecting patient's functional outcome.   REHAB POTENTIAL: Good  CLINICAL  DECISION MAKING: Evolving/moderate complexity  EVALUATION COMPLEXITY: Moderate   GOALS: Goals reviewed with patient? Yes  SHORT TERM GOALS: Target date: 05/03/22 I with initial HEP Baseline: Goal status: Met  LONG TERM GOALS: Target date: 07/06/22  I with final HEP Baseline:  Goal status: INITIAL  2.  Increase r knee ROM to <5-120 Baseline:  Goal status: ongoing  3.  Increase R knee strength to at least 4+/5 Baseline:  Goal status: ongoing  4.  Patient will be able to walk on level and unlevel surfaces, x at least 500', I, no pain Baseline:  Goal status: INITIAL  5.  Up and down at least 15 steps with UUE support, step over step each direction. Baseline:  Goal status: INITIAL   PLAN:  PT FREQUENCY: 2x/week  PT DURATION: 12 weeks  PLANNED INTERVENTIONS: Therapeutic exercises, Therapeutic activity, Neuromuscular re-education, Balance training, Gait training, Patient/Family education, Self Care, Joint mobilization, Stair training, Dry Needling, Electrical stimulation, Cryotherapy, Moist heat, Vasopneumatic device, Ionotophoresis '4mg'$ /ml Dexamethasone, and Manual therapy  PLAN FOR NEXT SESSION: Update HEP, progress gait, strength, and ROM training.   Nysir Fergusson April Ma L Reese Stockman, PT 05/07/2022, 12:37 PM

## 2022-05-08 ENCOUNTER — Ambulatory Visit: Payer: BC Managed Care – PPO | Admitting: Physical Therapy

## 2022-05-10 ENCOUNTER — Ambulatory Visit: Payer: BC Managed Care – PPO | Admitting: Physical Therapy

## 2022-05-11 ENCOUNTER — Ambulatory Visit: Payer: BC Managed Care – PPO | Admitting: Physical Therapy

## 2022-05-11 ENCOUNTER — Encounter: Payer: Self-pay | Admitting: Physical Therapy

## 2022-05-11 DIAGNOSIS — M25561 Pain in right knee: Secondary | ICD-10-CM | POA: Diagnosis not present

## 2022-05-11 DIAGNOSIS — R278 Other lack of coordination: Secondary | ICD-10-CM

## 2022-05-11 DIAGNOSIS — G8929 Other chronic pain: Secondary | ICD-10-CM

## 2022-05-11 DIAGNOSIS — M6281 Muscle weakness (generalized): Secondary | ICD-10-CM

## 2022-05-11 DIAGNOSIS — R600 Localized edema: Secondary | ICD-10-CM

## 2022-05-11 DIAGNOSIS — R262 Difficulty in walking, not elsewhere classified: Secondary | ICD-10-CM

## 2022-05-11 NOTE — Therapy (Signed)
OUTPATIENT PHYSICAL THERAPY LOWER EXTREMITY TREATMENT   Patient Name: Rebecca Gordon MRN: [id number] DOB:03/18/1956, 67 y.o., female Today's Date: 05/11/2022  END OF SESSION:  PT End of Session - 05/11/22 1036     Visit Number 9    Date for PT Re-Evaluation 07/06/22    PT Start Time 1015    PT Stop Time 1055    PT Time Calculation (min) 40 min    Activity Tolerance Patient tolerated treatment well;Patient limited by pain    Behavior During Therapy [facility] for tasks assessed/performed             Past Medical History:  Diagnosis Date   Alcoholism ([place])    Anal fissure    Aneurysm ([place]) 09/2016   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   Anxiety    Colon polyps    Depression    Endometrial cancer ([place])    Grade I   Endometrial polyp    Family history of brain cancer    Family history of prostate cancer    Former smoker 09/23/2016   GERD (gastroesophageal reflux disease)    Nexium   H/O clavicle fracture 2017   Right   High cholesterol    History of bronchitis    History of pneumonia    years ago   Hypertension    LVH (left ventricular hypertrophy) 09/23/2016   Mild, noted on ECHO   Numbness    left fingers after TIA   OA (osteoarthritis)    "right knee"   Osteoporosis 08/2017   T score -2.7   Panic attacks    Postmenopausal bleeding    Thyroid nodule 2019   Multiple   TIA (transient ischemic attack) 09/2016   Wears contact lenses    Past Surgical History:  Procedure Laterality Date   CLAVICLE SURGERY Right 2016   fracture repair   COLONOSCOPY  2017   multiples   CRANIOTOMY FOR ANEURYSM / VERTEBROBASILAR / CAROTID CIRCULATION  09/05/2021   DILATATION & CURETTAGE/HYSTEROSCOPY WITH MYOSURE N/A 05/26/2018   Procedure: DILATATION & CURETTAGE/HYSTEROSCOPY WITH MYOSURE;  Surgeon: Princess Bruins, MD;  Location: [place];  Service: Gynecology;  Laterality: N/A;   EYE SURGERY Right    "growth on eye removed"   FOOT SURGERY Right  08/2017   benign mass   HEMORRHOID SURGERY N/A 06/24/2018   Procedure: HEMORRHOIDECTOMY;  Surgeon: Michael [place], MD;  Location: [place];  Service: General;  Laterality: N/A;   KNEE ARTHROSCOPY Right    Menicus repair   ORIF CLAVICULAR FRACTURE Right 04/28/2015   Procedure: REVISION ORIF RIGHT CLAVICAL FRACTURE, ALLOGRAFT BONE GRAFTING;  Surgeon: Justice [place], MD;  Location: [place];  Service: Orthopedics;  Laterality: Right;   Peridontal Laser Surgery  01/2019   due to infection, x 2   PLACEMENT OF BREAST IMPLANTS Bilateral    ROBOTIC ASSISTED TOTAL HYSTERECTOMY WITH BILATERAL SALPINGO OOPHERECTOMY Bilateral 07/10/2018   Procedure: XI ROBOTIC ASSISTED TOTAL HYSTERECTOMY WITH BILATERAL SALPINGO OOPHORECTOMY;  Surgeon: Everitt Amber, MD;  Location: [facility];  Service: Gynecology;  Laterality: Bilateral;   SENTINEL NODE BIOPSY N/A 07/10/2018   Procedure: SENTINEL NODE BIOPSY, LEFT PELVIC NODE;  Surgeon: Everitt Amber, MD;  Location: [facility];  Service: Gynecology;  Laterality: N/A;   SPHINCTEROTOMY N/A 06/24/2018   Procedure: ANAL SPHINCTEROTOMY, ANORECTAL EXAM UNDER ANESTHESIA;  Surgeon: Michael [place], MD;  Location: [place];  Service: General;  Laterality: N/A;   TONSILLECTOMY     Patient Active  Problem List   Diagnosis Date Noted   Brain aneurysm 07/28/2021   Preop examination 07/07/2021   Eczema 07/07/2021   Myalgia 02/14/2021   Left leg pain 12/28/2020   Bilateral hearing loss 05/07/2020   Depression, major, single episode, moderate ([place]) 05/07/2020   Acute right-sided low back pain without sciatica 09/14/2019   Foul smelling urine 09/14/2019   Family history of colon cancer 02/23/2019   Family history of prostate cancer    Family history of brain cancer    Rash 01/13/2019   Hemorrhoids 08/01/2018   Anal fissure s/p internal sphincterotomy 06/24/2018 06/24/2018   Prolapsed internal hemorrhoids, grade 2&3, s/p ligation/pexy/hemorrhoidectomy 06/24/2018  06/24/2018   Endometrial cancer ([place]) 06/05/2018   Rectal tear 03/24/2018   Preventative health care 08/09/2017   Depression with anxiety 08/09/2017   Hyperlipidemia LDL goal <100 08/09/2017   Seasonal allergies 08/09/2017   Osteoarthritis of knee 05/29/2017   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[place]) 04/03/2017   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   Hx of anxiety disorder 04/03/2017   Smoking greater than 30 pack years 04/03/2017   Emphysema of lung ([place]) 04/03/2017   Arteriosclerotic cardiovascular disease (ASCVD) 04/03/2017   Alcoholism in remission ([place]) 04/02/2017   History of transient ischemic attack (TIA) 09/27/2016   Transient cerebral ischemia 09/27/2016   Gastroesophageal reflux disease 09/27/2016   Generalized anxiety disorder 09/27/2016   Gait disturbance 09/23/2016   Leukocytosis 09/23/2016   Primary hypertension 09/23/2016   HLD (hyperlipidemia) 09/23/2016   Former smoker 09/23/2016   Paresthesias 09/22/2016   SINUSITIS- ACUTE-NOS 05/12/2008    PCP: [name], DO  REFERRING PROVIDER: Gaynelle Arabian, MD  REFERRING DIAG:  Diagnosis  [phone] (ICD-10-CM) - Presence of right artificial knee joint    THERAPY DIAG:  Muscle weakness (generalized)  Localized edema  Other lack of coordination  Difficulty in walking, not elsewhere classified  Chronic pain of right knee  Acute pain of right knee  Rationale for Evaluation and Treatment: Rehabilitation  ONSET DATE: 04/11/22  SUBJECTIVE:   SUBJECTIVE STATEMENT: Pt states she has had a very busy and exhausting week. Her husband had lung surgery to remove a lump which turned out to be malignant- Lobectomy. Patient has been spending most of her time at the hospital this week and has not been able to perform her exercises as frequently as she would like.   PERTINENT HISTORY: S/P R TKR Brain aneurysm-clipped, OA R knee, scoliosis, leg length discrepancy, R shorter than L  PAIN:  Are you having  pain? Yes: NPRS scale: 4/10 Pain location: R knee Pain description: sharp Aggravating factors: stiffness Relieving factors: Medication , ice  PRECAUTIONS: None  WEIGHT BEARING RESTRICTIONS: No  FALLS:  Has patient fallen in last 6 months? 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   PLOF: Independent  PATIENT GOALS: Recover fully, walk.  NEXT MD VISIT: 3 weeks  OBJECTIVE:   DIAGNOSTIC FINDINGS: N/A   COGNITION: Overall cognitive status: Within functional limits for tasks assessed     SENSATION: Not tested  EDEMA:  Expected swelling   POSTURE: weight shift left  PALPATION: R thigh was severely indurated above the ace wrap, painful to touch.  LOWER EXTREMITY ROM: WNL except where noted.  Passive ROM Right eval Left eval Right 05/07/22  Hip  flexion     Hip extension     Hip abduction     Hip adduction     Hip internal rotation     Hip external rotation     Knee flexion 85  97  Knee extension 27  -12  Ankle dorsiflexion     Ankle plantarflexion     Ankle inversion     Ankle eversion       LOWER EXTREMITY MMT: LLE 5  MMT Right eval Left eval  Hip flexion 3-   Hip extension    Hip abduction 3-   Hip adduction    Hip internal rotation    Hip external rotation    Knee flexion 2+   Knee extension 3-   Ankle dorsiflexion 4   Ankle plantarflexion    Ankle inversion    Ankle eversion      GAIT: Distance walked: In clinic distances Assistive device utilized: Walker - 2 wheeled Level of assistance: Modified independence Comments: Patient reports difficulty managing her 2 steps. Educated to correct technique, she has a handle to hold onto.Walked in with max reliance on BUE support, step to gait pattern with RLE slightly  ER throughout gait cycle.   TODAY'S TREATMENT:                                                                                                                              DATE:  05/11/22 NuStep L4 x 6 minutes Supine AAROM for R knee ext, heel elevated Knee flexion ROM AAROM 6-103 degrees Knee flexion 15#, BLE, 10 # R only, 2 x 10 Knee ext 5#, eccentric R, 2 x 10 reps Ambulation x 150', VC for more natural knee flexion at HO Bicycle, able to achieve full revolutions with weight shift off hip, 6 minutes L1  05/07/22 NuStep L5 x 6 minutes STM R quad Hamstring stretch 2x30 sec Prone quad stretch with strap 3x30 sec Prone hamstring curl 2x10 Prone hip ext 2x10 Supine hip flexor/quad stretch x30 sec Supine heel slide with strap x10 STM & TPR R quad Seated knee flexion AROM/PROM 3x30 sec with manual over pressure  Seated knee ext AROM/PROM 10x3 sec with manual over pressure Amb x 2 laps around gym -- cues for decreasing wide BOS, knee ext with WB and toe off + knee flexion  05/03/22 NuStep L5 x 6 minutes STM to R knee for quads, HS, calf, fluid  Stretch for knee ext, AROM and AAROM Seated knee flex A and AAROM, 94 flex Seated knee flex against 15#, BLE x 10, RLE 10# 10 reps Seated knee ext BLE 10# 10 reps, R eccentric ext x 10, 5# Amulation focused on normalized gait. Still tends to demonstrate increased L lateral weight shift and decreased WB through R, but improved.  05/01/22 Bike partial revolutions x 6 minutes Supine R knee ext with heel elevated, A, then AAROM 3 x 5 sec,  Seated knee flex with foot planted, then  contract/relax R knee AAROM 12-92 STM to lower leg to move fluid out of the tissue. Seated knee flex and ext against Red Tband, x 10 each Step ups onto 6" step with BUE support, on R, 10 forward, 10 lateral Standing knee flexion x 10 for HS strength  04/26/22 NuStep L5 x 6 min R knee PROM flex and ext with end range holds LAQ 2lb 3x10 RLE HS curls red 2x15 Gait  w/ SPC 189f decrease R knee flex  Resisted gait 20lb x3 forward backwar 4 in step ups x5 each HHA x1   04/24/22 NuStep L5 x 6 min R knee PROM with end range holds S2S 2x10 Gait [id number] ad, decrease R knee flexion lateral trunk sway, Gait SPC [id number] press 20lb 2x10 LAQ RLE 3lb 3x10 Hamstring curls red RLE 2x10  04/19/21 R knee PROM  NuStep L5 x 5 min, L 1 x [id number] only S2S 3x5 no UE for the 3rd set  LAQ RLE 2lb 2x10 Hamstring Curls RLE red 2x10 Quad sets RLE 2x10 Standing marches 3x5    04/17/22 Supine knee press 10 x 5 sec Heel slides with min A x 10 reps Short kick, 10 x 5 seconds SLR x 10 reps Long kicks/knee flexion in sitting x 10 Seated AAROM flex and hold with overpressure, contract relax. 85 degrees AA flexion. NuStep L3 x 6 minutes, legs only Gait training with RW, Mod VC and min TC at R hip to maintain upright posture and step through pattern.  04/13/22  Education Gait training-initiated step through gait training, with increased WB through RLE in cycle, improved with practice. Quad sets, SAQ, HS, SLR, seated long kicks and knee flexion R hip IR in supine. Educated to prevent passive hip ER when lying down.  PATIENT EDUCATION:  Education details: HEP, POC Person educated: Patient and Spouse Education method: Explanation, Demonstration, and Handouts Education comprehension: verbalized understanding and returned demonstration  HOME EXERCISE PROGRAM: [id number]:  CLINICAL IMPRESSION: Patient's husband was hospitalized all week after surgery so she has not been as consistent with exercise, but has had to be much more mobile. In spite of this she has progressed with ROM and strength in R knee. Needs to continue to practice normal gait pattern.   OBJECTIVE IMPAIRMENTS: Abnormal gait, decreased activity tolerance, decreased balance, decreased coordination, decreased mobility, difficulty walking, decreased ROM, decreased strength, increased muscle  spasms, impaired flexibility, postural dysfunction, and pain.   ACTIVITY LIMITATIONS: carrying, lifting, bending, sitting, standing, squatting, sleeping, stairs, transfers, bathing, toileting, and locomotion level  PARTICIPATION LIMITATIONS: meal prep, cleaning, laundry, driving, shopping, community activity, and occupation  PERSONAL FACTORS: Age and Past/current experiences are also affecting patient's functional outcome.   REHAB POTENTIAL: Good  CLINICAL DECISION MAKING: Evolving/moderate complexity  EVALUATION COMPLEXITY: Moderate   GOALS: Goals reviewed with patient? Yes  SHORT TERM GOALS: Target date: 05/03/22 I with initial HEP Baseline: Goal status: Met  LONG TERM GOALS: Target date: 07/06/22  I with final HEP Baseline:  Goal status: INITIAL  2.  Increase r knee ROM to <5-120 Baseline:  Goal status: ongoing  3.  Increase R knee strength to at least 4+/5 Baseline:  Goal status: ongoing  4.  Patient will be able to walk on level and unlevel surfaces, x at least 500', I, no pain Baseline:  Goal status: INITIAL  5.  Up and down at least 15 steps with UUE support, step over step each direction. Baseline:  Goal status: INITIAL   PLAN:  PT FREQUENCY: 2x/week  PT DURATION: 12 weeks  PLANNED INTERVENTIONS: Therapeutic exercises, Therapeutic activity, Neuromuscular re-education, Balance training, Gait training, Patient/Family education, Self Care, Joint mobilization, Stair training, Dry Needling, Electrical stimulation, Cryotherapy, Moist heat, Vasopneumatic device, Ionotophoresis '4mg'$ /ml Dexamethasone, and Manual therapy  PLAN FOR NEXT SESSION: Update HEP, progress gait, strength, and ROM training.   Ethel Rana DPT 05/11/22 11:00 AM

## 2022-05-12 ENCOUNTER — Other Ambulatory Visit: Payer: Self-pay | Admitting: Family Medicine

## 2022-05-12 DIAGNOSIS — M1711 Unilateral primary osteoarthritis, right knee: Secondary | ICD-10-CM

## 2022-05-14 NOTE — Therapy (Signed)
OUTPATIENT PHYSICAL THERAPY LOWER EXTREMITY TREATMENT Progress Note Reporting Period 04/14/23 to 05/15/22  See note below for Objective Data and Assessment of Progress/Goals.      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   Anxiety    Colon polyps    Depression    Endometrial cancer ([place])    Grade I   Endometrial polyp    Family history of brain cancer    Family history of prostate cancer    Former smoker 09/23/2016   GERD (gastroesophageal reflux disease)    Nexium   H/O clavicle fracture 2017   Right   High cholesterol    History of bronchitis    History of pneumonia    years ago   Hypertension    LVH (left ventricular hypertrophy) 09/23/2016   Mild, noted on ECHO   Numbness    left fingers after TIA   OA (osteoarthritis)    "right knee"   Osteoporosis 08/2017   T score -2.7   Panic attacks    Postmenopausal bleeding    Thyroid nodule 2019   Multiple   TIA (transient ischemic attack) 09/2016   Wears contact lenses    Past Surgical History:  Procedure Laterality Date   CLAVICLE SURGERY Right 2016   fracture repair   COLONOSCOPY  2017   multiples   CRANIOTOMY FOR ANEURYSM / VERTEBROBASILAR / CAROTID CIRCULATION  09/05/2021   DILATATION & CURETTAGE/HYSTEROSCOPY WITH MYOSURE N/A 05/26/2018   Procedure: DILATATION & CURETTAGE/HYSTEROSCOPY WITH MYOSURE;  Surgeon: Princess Bruins, MD;  Location: [place];  Service: Gynecology;  Laterality: N/A;   EYE SURGERY Right    "growth on eye removed"   FOOT SURGERY Right 08/2017   benign mass   HEMORRHOID SURGERY N/A 06/24/2018   Procedure: HEMORRHOIDECTOMY;  Surgeon: Michael Boston, MD;  Location: [place];  Service: General;  Laterality: N/A;   KNEE ARTHROSCOPY Right    Menicus repair   ORIF CLAVICULAR FRACTURE Right 04/28/2015   Procedure: REVISION ORIF RIGHT CLAVICAL FRACTURE, ALLOGRAFT BONE GRAFTING;  Surgeon: Justice Britain, MD;  Location: [place];  Service: Orthopedics;  Laterality: Right;   Peridontal Laser Surgery  01/2019   due to infection, x 2   PLACEMENT OF BREAST IMPLANTS Bilateral    ROBOTIC ASSISTED TOTAL HYSTERECTOMY WITH BILATERAL SALPINGO OOPHERECTOMY Bilateral 07/10/2018   Procedure: XI ROBOTIC ASSISTED TOTAL HYSTERECTOMY WITH BILATERAL SALPINGO OOPHORECTOMY;  Surgeon: Everitt Amber, MD;  Location: [facility];  Service: Gynecology;  Laterality: Bilateral;   SENTINEL NODE BIOPSY N/A 07/10/2018   Procedure: SENTINEL NODE BIOPSY, LEFT PELVIC NODE;  Surgeon: Everitt Amber, MD;  Location: [facility];  Service: Gynecology;  Laterality: N/A;   SPHINCTEROTOMY N/A 06/24/2018   Procedure: ANAL SPHINCTEROTOMY, ANORECTAL EXAM UNDER ANESTHESIA;  Surgeon: Michael Boston, MD;  Location: [place];  Service: General;  Laterality: N/A;   TONSILLECTOMY     Patient Active Problem List   Diagnosis Date Noted   Brain aneurysm 07/28/2021   Preop examination 07/07/2021   Eczema 07/07/2021   Myalgia 02/14/2021   Left leg pain 12/28/2020   Bilateral hearing loss 05/07/2020   Depression, major, single episode, moderate ([place]) 05/07/2020   Acute right-sided low back pain without sciatica 09/14/2019   Foul smelling urine 09/14/2019   Family history of colon cancer 02/23/2019   Family history of prostate cancer    Family history of brain cancer    Rash 01/13/2019   Hemorrhoids 08/01/2018   Anal fissure s/p internal  sphincterotomy 06/24/2018 06/24/2018   Prolapsed internal hemorrhoids, grade 2&3, s/p ligation/pexy/hemorrhoidectomy 06/24/2018 06/24/2018   Endometrial cancer ([place]) 06/05/2018   Rectal tear 03/24/2018   Preventative health care 08/09/2017   Depression with anxiety 08/09/2017   Hyperlipidemia LDL goal <100 08/09/2017   Seasonal allergies 08/09/2017   Osteoarthritis of knee 05/29/2017   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/03/2017   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   Hx of anxiety disorder 04/03/2017   Smoking greater than 30 pack years 04/03/2017   Emphysema of lung ([place]) 04/03/2017   Arteriosclerotic cardiovascular disease (ASCVD) 04/03/2017   Alcoholism in remission ([place]) 04/02/2017   History of transient ischemic attack (TIA) 09/27/2016   Transient cerebral ischemia 09/27/2016   Gastroesophageal reflux disease 09/27/2016   Generalized anxiety disorder 09/27/2016   Gait disturbance 09/23/2016   Leukocytosis 09/23/2016   Primary hypertension 09/23/2016   HLD (hyperlipidemia) 09/23/2016   Former smoker 09/23/2016   Paresthesias 09/22/2016   SINUSITIS- ACUTE-NOS 05/12/2008    PCP: [name], DO  REFERRING PROVIDER: Gaynelle Arabian, MD  REFERRING DIAG:  Diagnosis  [phone] (ICD-10-CM) - Presence of right artificial knee joint    THERAPY DIAG:  Muscle weakness (generalized)  Other lack of coordination  Difficulty in walking, not elsewhere classified  Chronic pain of right knee  Localized edema  Rationale for Evaluation and Treatment: Rehabilitation  ONSET DATE: 04/11/22  SUBJECTIVE:   SUBJECTIVE STATEMENT: I am doing okay, I have had a lot of aches and pains in both legs at night.   PERTINENT HISTORY: S/P R TKR Brain aneurysm-clipped, OA R knee, scoliosis, leg length discrepancy, R shorter than L  PAIN:  Are you having pain? Yes: NPRS scale: 3/10 Pain location: R knee Pain description: sharp Aggravating factors: stiffness Relieving  factors: Medication , ice  PRECAUTIONS: None  WEIGHT BEARING RESTRICTIONS: No  FALLS:  Has patient fallen in last 6 months? 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   PLOF: Independent  PATIENT GOALS: Recover fully, walk.  NEXT MD VISIT: 3 weeks  OBJECTIVE:   DIAGNOSTIC FINDINGS: N/A   COGNITION: Overall cognitive status: Within functional limits for tasks assessed     SENSATION: Not tested  EDEMA:  Expected swelling   POSTURE: weight shift left  PALPATION: R thigh was severely indurated above the ace wrap, painful to touch.  LOWER EXTREMITY ROM: WNL except where noted.  Passive ROM Right eval Left eval Right 05/07/22 Right 05/15/22  Hip flexion      Hip extension      Hip abduction      Hip  adduction      Hip internal rotation      Hip external rotation      Knee flexion 85  97 98  Knee extension 27  -12 -10  Ankle dorsiflexion      Ankle plantarflexion      Ankle inversion      Ankle eversion        LOWER EXTREMITY MMT: LLE 5  MMT Right eval Right 05/15/22  Hip flexion 3- 4  Hip extension    Hip abduction 3- 4  Hip adduction    Hip internal rotation    Hip external rotation    Knee flexion 2+ 4  Knee extension 3- 4  Ankle dorsiflexion 4   Ankle plantarflexion    Ankle inversion    Ankle eversion      GAIT: Distance walked: In clinic distances Assistive device utilized: Walker - 2 wheeled Level of assistance: Modified independence Comments: Patient reports difficulty managing her 2 steps. Educated to correct technique, she has a handle to hold onto.Walked in with max reliance on BUE support, step to gait pattern with RLE slightly ER throughout gait cycle.   TODAY'S TREATMENT:                                                                                                                               DATE:  05/15/22 Progress note- recheck strength, ROM, and goals Stair training in other building NuStep L5 x5 mins PROM- x5 flex/ext Contract relax x5  Leg ext 5# RLE 2x10 HS curls 10# 2x10 Leg press 20# 2x10, progressive lowering seat to 5  Anterior step downs 4" x10 Calf stretch on slant 30s   05/11/22 NuStep L4 x 6 minutes Supine AAROM for R knee ext, heel elevated Knee flexion ROM AAROM 6-103 degrees Knee flexion 15#, BLE, 10 # R only, 2 x 10 Knee ext 5#, eccentric R, 2 x 10 reps Ambulation x 150', VC for more natural knee flexion at HO Bicycle, able to achieve full revolutions with weight shift off hip, 6 minutes L1  05/07/22 NuStep L5 x 6 minutes STM R quad Hamstring stretch 2x30 sec Prone quad stretch with strap 3x30 sec Prone hamstring curl 2x10 Prone hip ext 2x10 Supine hip flexor/quad stretch x30 sec Supine heel slide with strap x10 STM & TPR R quad Seated knee flexion AROM/PROM 3x30 sec with manual over pressure  Seated knee ext AROM/PROM 10x3 sec with manual over pressure Amb x 2 laps around gym -- cues for decreasing wide BOS, knee ext with WB and toe off + knee flexion  05/03/22 NuStep L5 x 6 minutes STM to R knee for quads, HS, calf, fluid  Stretch for knee ext, AROM and AAROM Seated knee flex A and AAROM, 94 flex Seated knee flex against 15#, BLE x 10, RLE 10# 10 reps Seated knee ext BLE 10# 10 reps, R eccentric ext x 10, 5# Amulation focused on normalized gait.  Still tends to demonstrate increased L lateral weight shift and decreased WB through R, but improved.  05/01/22 Bike partial revolutions x 6 minutes Supine R knee ext with heel elevated, A, then AAROM 3 x 5 sec,  Seated knee flex with foot planted, then contract/relax R knee AAROM 12-92 STM to lower leg to move fluid out of the tissue. Seated knee flex and ext against Red Tband, x 10 each Step ups  onto 6" step with BUE support, on R, 10 forward, 10 lateral Standing knee flexion x 10 for HS strength  04/26/22 NuStep L5 x 6 min R knee PROM flex and ext with end range holds LAQ 2lb 3x10 RLE HS curls red 2x15 Gait w/ SPC 128f decrease R knee flex  Resisted gait 20lb x3 forward backwar 4 in step ups x5 each HHA x1   04/24/22 NuStep L5 x 6 min R knee PROM with end range holds S2S 2x10 Gait [id number] ad, decrease R knee flexion lateral trunk sway, Gait SPC [id number] press 20lb 2x10 LAQ RLE 3lb 3x10 Hamstring curls red RLE 2x10  04/19/21 R knee PROM  NuStep L5 x 5 min, L 1 x [id number] only S2S 3x5 no UE for the 3rd set  LAQ RLE 2lb 2x10 Hamstring Curls RLE red 2x10 Quad sets RLE 2x10 Standing marches 3x5    04/17/22 Supine knee press 10 x 5 sec Heel slides with min A x 10 reps Short kick, 10 x 5 seconds SLR x 10 reps Long kicks/knee flexion in sitting x 10 Seated AAROM flex and hold with overpressure, contract relax. 85 degrees AA flexion. NuStep L3 x 6 minutes, legs only Gait training with RW, Mod VC and min TC at R hip to maintain upright posture and step through pattern.  04/13/22  Education Gait training-initiated step through gait training, with increased WB through RLE in cycle, improved with practice. Quad sets, SAQ, HS, SLR, seated long kicks and knee flexion R hip IR in supine. Educated to prevent passive hip ER when lying down.  PATIENT EDUCATION:  Education details: HEP, POC Person educated: Patient and Spouse Education method: Explanation, Demonstration, and Handouts Education comprehension: verbalized understanding and returned demonstration  HOME EXERCISE PROGRAM: [id number]:  CLINICAL IMPRESSION: Progress note complete, she has made some progress towards her LTGs. Still has some ROM deficits in both flexion and extension. With stair training she is able to go up with an alternating pattern but descending she is has to do a step to pattern due  to pain in R knee. Unable to get full extension with RLE on knee extension machine. Will benefit from more stretching and eccentric control of the quads.    OBJECTIVE IMPAIRMENTS: Abnormal gait, decreased activity tolerance, decreased balance, decreased coordination, decreased mobility, difficulty walking, decreased ROM, decreased strength, increased muscle spasms, impaired flexibility, postural dysfunction, and pain.   ACTIVITY LIMITATIONS: carrying, lifting, bending, sitting, standing, squatting, sleeping, stairs, transfers, bathing, toileting, and locomotion level  PARTICIPATION LIMITATIONS: meal prep, cleaning, laundry, driving, shopping, community activity, and occupation  PERSONAL FACTORS: Age and Past/current experiences are also affecting patient's functional outcome.   REHAB POTENTIAL: Good  CLINICAL DECISION MAKING: Evolving/moderate complexity  EVALUATION COMPLEXITY: Moderate   GOALS: Goals reviewed with patient? Yes  SHORT TERM GOALS: Target date: 05/03/22 I with initial HEP Baseline: Goal status: Met  LONG TERM GOALS: Target date: 07/06/22  I with final HEP Baseline:  Goal status: INITIAL  2.  Increase r knee ROM to <5-120  Baseline:  Goal status: ongoing  3.  Increase R knee strength to at least 4+/5 Baseline:  Goal status: ongoing  4.  Patient will be able to walk on level and unlevel surfaces, x at least 500', I, no pain Baseline:  Goal status: IN PROGRESS  5.  Up and down at least 15 steps with UUE support, step over step each direction. Baseline:  Goal status: IN PROGRESS   PLAN:  PT FREQUENCY: 2x/week  PT DURATION: 12 weeks  PLANNED INTERVENTIONS: Therapeutic exercises, Therapeutic activity, Neuromuscular re-education, Balance training, Gait training, Patient/Family education, Self Care, Joint mobilization, Stair training, Dry Needling, Electrical stimulation, Cryotherapy, Moist heat, Vasopneumatic device, Ionotophoresis '4mg'$ /ml Dexamethasone, and  Manual therapy  PLAN FOR NEXT SESSION: Update HEP, progress gait, strength, and ROM training.   Ethel Rana DPT 05/15/22 11:49 AM

## 2022-05-15 ENCOUNTER — Ambulatory Visit: Payer: BC Managed Care – PPO

## 2022-05-15 DIAGNOSIS — R278 Other lack of coordination: Secondary | ICD-10-CM

## 2022-05-15 DIAGNOSIS — M6281 Muscle weakness (generalized): Secondary | ICD-10-CM

## 2022-05-15 DIAGNOSIS — R262 Difficulty in walking, not elsewhere classified: Secondary | ICD-10-CM

## 2022-05-15 DIAGNOSIS — M25561 Pain in right knee: Secondary | ICD-10-CM | POA: Diagnosis not present

## 2022-05-15 DIAGNOSIS — G8929 Other chronic pain: Secondary | ICD-10-CM

## 2022-05-15 DIAGNOSIS — R600 Localized edema: Secondary | ICD-10-CM

## 2022-05-17 ENCOUNTER — Encounter: Payer: Self-pay | Admitting: Physical Therapy

## 2022-05-17 ENCOUNTER — Ambulatory Visit: Payer: BC Managed Care – PPO | Attending: Orthopedic Surgery | Admitting: Physical Therapy

## 2022-05-17 DIAGNOSIS — R278 Other lack of coordination: Secondary | ICD-10-CM | POA: Insufficient documentation

## 2022-05-17 DIAGNOSIS — R262 Difficulty in walking, not elsewhere classified: Secondary | ICD-10-CM | POA: Diagnosis present

## 2022-05-17 DIAGNOSIS — M5441 Lumbago with sciatica, right side: Secondary | ICD-10-CM | POA: Diagnosis present

## 2022-05-17 DIAGNOSIS — G8929 Other chronic pain: Secondary | ICD-10-CM | POA: Diagnosis present

## 2022-05-17 DIAGNOSIS — R293 Abnormal posture: Secondary | ICD-10-CM | POA: Diagnosis present

## 2022-05-17 DIAGNOSIS — R600 Localized edema: Secondary | ICD-10-CM | POA: Diagnosis present

## 2022-05-17 DIAGNOSIS — M25561 Pain in right knee: Secondary | ICD-10-CM | POA: Diagnosis present

## 2022-05-17 DIAGNOSIS — M5442 Lumbago with sciatica, left side: Secondary | ICD-10-CM | POA: Diagnosis present

## 2022-05-17 DIAGNOSIS — M6281 Muscle weakness (generalized): Secondary | ICD-10-CM | POA: Diagnosis present

## 2022-05-17 NOTE — Therapy (Signed)
OUTPATIENT PHYSICAL THERAPY LOWER EXTREMITY TREATMENT Progress Note Reporting Period 04/14/23 to 05/15/22  See note below for Objective Data and Assessment of Progress/Goals.      Patient Name: Rebecca Gordon MRN: [id number] DOB:June 14, 1955, 67 y.o., female Today's Date: 05/17/2022  END OF SESSION:  PT End of Session - 05/17/22 1017     Visit Number 11    Date for PT Re-Evaluation 07/06/22    PT Start Time 1012    PT Stop Time 1052    PT Time Calculation (min) 40 min    Activity Tolerance Patient tolerated treatment well;Patient limited by pain    Behavior During Therapy [facility] for tasks assessed/performed             Past Medical History:  Diagnosis Date   Alcoholism ([place])    Anal fissure    Aneurysm (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   Anxiety    Colon polyps    Depression    Endometrial cancer ([place])    Grade I   Endometrial polyp    Family history of brain cancer    Family history of prostate cancer    Former smoker 09/23/2016   GERD (gastroesophageal reflux disease)    Nexium   H/O clavicle fracture 2017   Right   High cholesterol    History of bronchitis    History of pneumonia    years ago   Hypertension    LVH (left ventricular hypertrophy) 09/23/2016   Mild, noted on ECHO   Numbness    left fingers after TIA   OA (osteoarthritis)    "right knee"   Osteoporosis 08/2017   T score -2.7   Panic attacks    Postmenopausal bleeding    Thyroid nodule 2019   Multiple   TIA (transient ischemic attack) 09/2016   Wears contact lenses    Past Surgical History:  Procedure Laterality Date   CLAVICLE SURGERY Right 2016   fracture repair   COLONOSCOPY  2017   multiples   CRANIOTOMY FOR ANEURYSM / VERTEBROBASILAR / CAROTID CIRCULATION  09/05/2021   DILATATION & CURETTAGE/HYSTEROSCOPY WITH MYOSURE N/A 05/26/2018   Procedure: DILATATION & CURETTAGE/HYSTEROSCOPY WITH MYOSURE;  Surgeon: Princess Bruins, MD;  Location: [place];  Service: Gynecology;  Laterality: N/A;   EYE SURGERY Right    "growth on eye removed"   FOOT SURGERY Right 08/2017   benign mass   HEMORRHOID SURGERY N/A 06/24/2018   Procedure: HEMORRHOIDECTOMY;  Surgeon: Michael Boston, MD;  Location: [place];  Service: General;  Laterality: N/A;   KNEE ARTHROSCOPY Right    Menicus repair   ORIF CLAVICULAR FRACTURE Right 04/28/2015   Procedure: REVISION ORIF RIGHT CLAVICAL FRACTURE, ALLOGRAFT BONE GRAFTING;  Surgeon: Justice Britain, MD;  Location: [place];  Service: Orthopedics;  Laterality: Right;   Peridontal Laser Surgery  01/2019   due to infection, x 2   PLACEMENT OF BREAST IMPLANTS Bilateral    ROBOTIC ASSISTED TOTAL HYSTERECTOMY WITH BILATERAL SALPINGO OOPHERECTOMY Bilateral 07/10/2018   Procedure: XI ROBOTIC ASSISTED TOTAL HYSTERECTOMY WITH BILATERAL SALPINGO OOPHORECTOMY;  Surgeon: Everitt Amber, MD;  Location: [facility];  Service: Gynecology;  Laterality: Bilateral;   SENTINEL NODE BIOPSY N/A 07/10/2018   Procedure: SENTINEL NODE BIOPSY, LEFT PELVIC NODE;  Surgeon: Everitt Amber, MD;  Location: [facility];  Service: Gynecology;  Laterality: N/A;   SPHINCTEROTOMY N/A 06/24/2018   Procedure: ANAL SPHINCTEROTOMY, ANORECTAL EXAM UNDER ANESTHESIA;  Surgeon: Michael Boston, MD;  Location: [place];  Service: General;  Laterality: N/A;   TONSILLECTOMY     Patient Active Problem List   Diagnosis Date Noted   Brain aneurysm 07/28/2021   Preop examination 07/07/2021   Eczema 07/07/2021   Myalgia 02/14/2021   Left leg pain 12/28/2020   Bilateral hearing loss 05/07/2020   Depression, major, single episode, moderate ([place]) 05/07/2020   Acute right-sided low back pain without sciatica 09/14/2019   Foul smelling urine 09/14/2019   Family history of colon cancer 02/23/2019   Family history of prostate cancer    Family history of brain cancer    Rash 01/13/2019   Hemorrhoids 08/01/2018   Anal fissure s/p internal  sphincterotomy 06/24/2018 06/24/2018   Prolapsed internal hemorrhoids, grade 2&3, s/p ligation/pexy/hemorrhoidectomy 06/24/2018 06/24/2018   Endometrial cancer ([place]) 06/05/2018   Rectal tear 03/24/2018   Preventative health care 08/09/2017   Depression with anxiety 08/09/2017   Hyperlipidemia LDL goal <100 08/09/2017   Seasonal allergies 08/09/2017   Osteoarthritis of knee 05/29/2017   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/03/2017   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   Hx of anxiety disorder 04/03/2017   Smoking greater than 30 pack years 04/03/2017   Emphysema of lung ([place]) 04/03/2017   Arteriosclerotic cardiovascular disease (ASCVD) 04/03/2017   Alcoholism in remission ([place]) 04/02/2017   History of transient ischemic attack (TIA) 09/27/2016   Transient cerebral ischemia 09/27/2016   Gastroesophageal reflux disease 09/27/2016   Generalized anxiety disorder 09/27/2016   Gait disturbance 09/23/2016   Leukocytosis 09/23/2016   Primary hypertension 09/23/2016   HLD (hyperlipidemia) 09/23/2016   Former smoker 09/23/2016   Paresthesias 09/22/2016   SINUSITIS- ACUTE-NOS 05/12/2008    PCP: [name], DO  REFERRING PROVIDER: Gaynelle Arabian, MD  REFERRING DIAG:  Diagnosis  [phone] (ICD-10-CM) - Presence of right artificial knee joint    THERAPY DIAG:  Muscle weakness (generalized)  Other lack of coordination  Difficulty in walking, not elsewhere classified  Chronic pain of right knee  Localized edema  Rationale for Evaluation and Treatment: Rehabilitation  ONSET DATE: 04/11/22  SUBJECTIVE:   SUBJECTIVE STATEMENT: I am doing okay, I have had a lot of aches and pains in both legs at night.   PERTINENT HISTORY: S/P R TKR Brain aneurysm-clipped, OA R knee, scoliosis, leg length discrepancy, R shorter than L  PAIN:  Are you having pain? Yes: NPRS scale: 3/10 Pain location: R knee Pain description: sharp Aggravating factors: stiffness Relieving  factors: Medication , ice  PRECAUTIONS: None  WEIGHT BEARING RESTRICTIONS: No  FALLS:  Has patient fallen in last 6 months? 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   PLOF: Independent  PATIENT GOALS: Recover fully, walk.  NEXT MD VISIT: 3 weeks  OBJECTIVE:   DIAGNOSTIC FINDINGS: N/A   COGNITION: Overall cognitive status: Within functional limits for tasks assessed     SENSATION: Not tested  EDEMA:  Expected swelling   POSTURE: weight shift left  PALPATION: R thigh was severely indurated above the ace wrap, painful to touch.  LOWER EXTREMITY ROM: WNL except where noted.  Passive ROM Right eval Left eval Right 05/07/22 Right 05/15/22  Hip flexion      Hip extension      Hip abduction      Hip  adduction      Hip internal rotation      Hip external rotation      Knee flexion 85  97 98  Knee extension 27  -12 -10  Ankle dorsiflexion      Ankle plantarflexion      Ankle inversion      Ankle eversion        LOWER EXTREMITY MMT: LLE 5  MMT Right eval Right 05/15/22  Hip flexion 3- 4  Hip extension    Hip abduction 3- 4  Hip adduction    Hip internal rotation    Hip external rotation    Knee flexion 2+ 4  Knee extension 3- 4  Ankle dorsiflexion 4   Ankle plantarflexion    Ankle inversion    Ankle eversion      GAIT: Distance walked: In clinic distances Assistive device utilized: Walker - 2 wheeled Level of assistance: Modified independence Comments: Patient reports difficulty managing her 2 steps. Educated to correct technique, she has a handle to hold onto.Walked in with max reliance on BUE support, step to gait pattern with RLE slightly ER throughout gait cycle.   TODAY'S TREATMENT:                                                                                                                               DATE:  05/17/22 Bike L1 x 6 minutes Initially had to weight shift strongly to the L to get around Supine PROM for knee ext with heel elevated 5 x 5 sec with overpressure Knee ext pushing into ball, hold x 5 sec , 10 reps Seated knee flexion ROM, contract relax  R knee PROM 6-109 Seated R knee flexion 15#, 2 x 10 reps Seated R knee ext 5#, 2 x 10 reps Standing on BOSU ball, static for balance, no UE support, then mini squats with minimal UE support, x 10 reps Gait training, emphasize increased speed for more normalized gait mechanics-Still demo decreased R stance time, increased lateral trunk sway, sometimes decreased R knee flex in swing.   05/15/22 Progress note- recheck strength, ROM, and goals Stair training in other building NuStep L5 x5 mins PROM- x5 flex/ext Contract relax x5  Leg ext 5# RLE 2x10 HS curls 10# 2x10 Leg press 20# 2x10, progressive lowering seat to 5  Anterior step downs 4" x10 Calf stretch on slant 30s   05/11/22 NuStep L4 x 6 minutes Supine AAROM for R knee ext, heel elevated Knee flexion ROM AAROM 6-103 degrees Knee flexion 15#, BLE, 10 # R only, 2 x 10 Knee ext 5#, eccentric R, 2 x 10 reps Ambulation x 150', VC for more natural knee flexion at HO Bicycle, able to achieve full revolutions with weight shift off hip, 6 minutes L1  05/07/22 NuStep L5 x 6 minutes STM R quad Hamstring stretch 2x30 sec Prone quad stretch with strap 3x30 sec Prone hamstring curl 2x10 Prone hip ext 2x10 Supine  hip flexor/quad stretch x30 sec Supine heel slide with strap x10 STM & TPR R quad Seated knee flexion AROM/PROM 3x30 sec with manual over pressure  Seated knee ext AROM/PROM 10x3 sec with manual over pressure Amb x 2 laps around gym -- cues for decreasing wide BOS, knee ext with WB and toe off + knee flexion  05/03/22 NuStep L5 x 6 minutes STM to R knee for quads, HS,  calf, fluid  Stretch for knee ext, AROM and AAROM Seated knee flex A and AAROM, 94 flex Seated knee flex against 15#, BLE x 10, RLE 10# 10 reps Seated knee ext BLE 10# 10 reps, R eccentric ext x 10, 5# Amulation focused on normalized gait. Still tends to demonstrate increased L lateral weight shift and decreased WB through R, but improved.  05/01/22 Bike partial revolutions x 6 minutes Supine R knee ext with heel elevated, A, then AAROM 3 x 5 sec,  Seated knee flex with foot planted, then contract/relax R knee AAROM 12-92 STM to lower leg to move fluid out of the tissue. Seated knee flex and ext against Red Tband, x 10 each Step ups onto 6" step with BUE support, on R, 10 forward, 10 lateral Standing knee flexion x 10 for HS strength  04/26/22 NuStep L5 x 6 min R knee PROM flex and ext with end range holds LAQ 2lb 3x10 RLE HS curls red 2x15 Gait w/ SPC 126f decrease R knee flex  Resisted gait 20lb x3 forward backwar 4 in step ups x5 each HHA x1   04/24/22 NuStep L5 x 6 min R knee PROM with end range holds S2S 2x10 Gait [id number] ad, decrease R knee flexion lateral trunk sway, Gait SPC [id number] press 20lb 2x10 LAQ RLE 3lb 3x10 Hamstring curls red RLE 2x10  04/19/21 R knee PROM  NuStep L5 x 5 min, L 1 x [id number] only S2S 3x5 no UE for the 3rd set  LAQ RLE 2lb 2x10 Hamstring Curls RLE red 2x10 Quad sets RLE 2x10 Standing marches 3x5    04/17/22 Supine knee press 10 x 5 sec Heel slides with min A x 10 reps Short kick, 10 x 5 seconds SLR x 10 reps Long kicks/knee flexion in sitting x 10 Seated AAROM flex and hold with overpressure, contract relax. 85 degrees AA flexion. NuStep L3 x 6 minutes, legs only Gait training with RW, Mod VC and min TC at R hip to maintain upright posture and step through pattern.  04/13/22  Education Gait training-initiated step through gait training, with increased WB through RLE in cycle, improved with practice. Quad sets, SAQ, HS, SLR, seated  long kicks and knee flexion R hip IR in supine. Educated to prevent passive hip ER when lying down.  PATIENT EDUCATION:  Education details: HEP, POC Person educated: Patient and Spouse Education method: Explanation, Demonstration, and Handouts Education comprehension: verbalized understanding and returned demonstration  HOME EXERCISE PROGRAM: [id number]:  CLINICAL IMPRESSION: Patient reports some cramping and pain at night, interfering in her sleep. Her R calf and quad continue to feel stiff  and taught. Her ROM is slowly increasing as is her strength in the knee. Also performed some functional strengthening to improve coordinated control.   OBJECTIVE IMPAIRMENTS: Abnormal gait, decreased activity tolerance, decreased balance, decreased coordination, decreased mobility, difficulty walking, decreased ROM, decreased strength, increased muscle spasms, impaired flexibility, postural dysfunction, and pain.   ACTIVITY LIMITATIONS: carrying, lifting, bending, sitting, standing, squatting, sleeping, stairs, transfers, bathing, toileting, and  locomotion level  PARTICIPATION LIMITATIONS: meal prep, cleaning, laundry, driving, shopping, community activity, and occupation  PERSONAL FACTORS: Age and Past/current experiences are also affecting patient's functional outcome.   REHAB POTENTIAL: Good  CLINICAL DECISION MAKING: Evolving/moderate complexity  EVALUATION COMPLEXITY: Moderate   GOALS: Goals reviewed with patient? Yes  SHORT TERM GOALS: Target date: 05/03/22 I with initial HEP Baseline: Goal status: Met  LONG TERM GOALS: Target date: 07/06/22  I with final HEP Baseline:  Goal status: INITIAL  2.  Increase r knee ROM to <5-120 Baseline:  Goal status: ongoing  3.  Increase R knee strength to at least 4+/5 Baseline:  Goal status: ongoing  4.  Patient will be able to walk on level and unlevel surfaces, x at least 500', I, no pain Baseline:  Goal status: IN  PROGRESS  5.  Up and down at least 15 steps with UUE support, step over step each direction. Baseline:  Goal status: IN PROGRESS   PLAN:  PT FREQUENCY: 2x/week  PT DURATION: 12 weeks  PLANNED INTERVENTIONS: Therapeutic exercises, Therapeutic activity, Neuromuscular re-education, Balance training, Gait training, Patient/Family education, Self Care, Joint mobilization, Stair training, Dry Needling, Electrical stimulation, Cryotherapy, Moist heat, Vasopneumatic device, Ionotophoresis '4mg'$ /ml Dexamethasone, and Manual therapy  PLAN FOR NEXT SESSION: Update HEP, progress gait, strength, and ROM training.   Ethel Rana DPT 05/17/22 10:59 AM

## 2022-05-18 ENCOUNTER — Telehealth: Payer: Self-pay | Admitting: *Deleted

## 2022-05-18 NOTE — Telephone Encounter (Addendum)
Deductible n/a  OOP MAX $3900 (29.6 met)  Annual exam 10/27/2021  Calcium  9.5           Date 01/16/2022  Upcoming dental procedures   Is Prior Authorization needed not required   Pt estimated Cost $100   Appt 06/11/2022  Coverage Details: $50 one dose, $50 admin fee

## 2022-05-22 ENCOUNTER — Ambulatory Visit: Payer: BC Managed Care – PPO | Admitting: Physical Therapy

## 2022-05-22 ENCOUNTER — Encounter: Payer: Self-pay | Admitting: Physical Therapy

## 2022-05-22 DIAGNOSIS — R262 Difficulty in walking, not elsewhere classified: Secondary | ICD-10-CM

## 2022-05-22 DIAGNOSIS — M25561 Pain in right knee: Secondary | ICD-10-CM

## 2022-05-22 DIAGNOSIS — R600 Localized edema: Secondary | ICD-10-CM

## 2022-05-22 DIAGNOSIS — M6281 Muscle weakness (generalized): Secondary | ICD-10-CM

## 2022-05-22 NOTE — Telephone Encounter (Signed)
Pt had knee replacement 04/11/2022. Routing to Provider to get clearance to start back on Prolia.

## 2022-05-22 NOTE — Therapy (Signed)
OUTPATIENT PHYSICAL THERAPY LOWER EXTREMITY    Patient Name: Classie Weng MRN: [id number] DOB:[birth year], 67 y.o., female Today's Date: 05/22/2022  END OF SESSION:  PT End of Session - 05/22/22 1025     Visit Number 12    Date for PT Re-Evaluation 07/06/22    PT Start Time 1015    PT Stop Time 1100    PT Time Calculation (min) 45 min    Activity Tolerance Patient tolerated treatment well;Patient limited by pain    Behavior During Therapy [facility] for tasks assessed/performed             Past Medical History:  Diagnosis Date   Alcoholism ([place])    Anal fissure    Aneurysm ([place]) 09/2016   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   Anxiety    Colon polyps    Depression    Endometrial cancer ([place])    Grade I   Endometrial polyp    Family history of brain cancer    Family history of prostate cancer    Former smoker 09/23/2016   GERD (gastroesophageal reflux disease)    Nexium   H/O clavicle fracture 2017   Right   High cholesterol    History of bronchitis    History of pneumonia    years ago   Hypertension    LVH (left ventricular hypertrophy) 09/23/2016   Mild, noted on ECHO   Numbness    left fingers after TIA   OA (osteoarthritis)    "right knee"   Osteoporosis 08/2017   T score -2.7   Panic attacks    Postmenopausal bleeding    Thyroid nodule 2019   Multiple   TIA (transient ischemic attack) 09/2016   Wears contact lenses    Past Surgical History:  Procedure Laterality Date   CLAVICLE SURGERY Right 2016   fracture repair   COLONOSCOPY  2017   multiples   CRANIOTOMY FOR ANEURYSM / VERTEBROBASILAR / CAROTID CIRCULATION  09/05/2021   DILATATION & CURETTAGE/HYSTEROSCOPY WITH MYOSURE N/A 05/26/2018   Procedure: DILATATION & CURETTAGE/HYSTEROSCOPY WITH MYOSURE;  Surgeon: Princess Bruins, MD;  Location: [place];  Service: Gynecology;  Laterality: N/A;   EYE SURGERY Right    "growth on eye removed"   FOOT SURGERY Right 08/2017    benign mass   HEMORRHOID SURGERY N/A 06/24/2018   Procedure: HEMORRHOIDECTOMY;  Surgeon: Elizabth Palka Boston, MD;  Location: [place];  Service: General;  Laterality: N/A;   KNEE ARTHROSCOPY Right    Menicus repair   ORIF CLAVICULAR FRACTURE Right 04/28/2015   Procedure: REVISION ORIF RIGHT CLAVICAL FRACTURE, ALLOGRAFT BONE GRAFTING;  Surgeon: Justice Britain, MD;  Location: [place];  Service: Orthopedics;  Laterality: Right;   Peridontal Laser Surgery  01/2019   due to infection, x 2   PLACEMENT OF BREAST IMPLANTS Bilateral    ROBOTIC ASSISTED TOTAL HYSTERECTOMY WITH BILATERAL SALPINGO OOPHERECTOMY Bilateral 07/10/2018   Procedure: XI ROBOTIC ASSISTED TOTAL HYSTERECTOMY WITH BILATERAL SALPINGO OOPHORECTOMY;  Surgeon: Everitt Amber, MD;  Location: [facility];  Service: Gynecology;  Laterality: Bilateral;   SENTINEL NODE BIOPSY N/A 07/10/2018   Procedure: SENTINEL NODE BIOPSY, LEFT PELVIC NODE;  Surgeon: Everitt Amber, MD;  Location: [facility];  Service: Gynecology;  Laterality: N/A;   SPHINCTEROTOMY N/A 06/24/2018   Procedure: ANAL SPHINCTEROTOMY, ANORECTAL EXAM UNDER ANESTHESIA;  Surgeon: Taylinn Brabant Boston, MD;  Location: [place];  Service: General;  Laterality: N/A;   TONSILLECTOMY     Patient Active  Problem List   Diagnosis Date Noted   Brain aneurysm 07/28/2021   Preop examination 07/07/2021   Eczema 07/07/2021   Myalgia 02/14/2021   Left leg pain 12/28/2020   Bilateral hearing loss 05/07/2020   Depression, major, single episode, moderate ([place]) 05/07/2020   Acute right-sided low back pain without sciatica 09/14/2019   Foul smelling urine 09/14/2019   Family history of colon cancer 02/23/2019   Family history of prostate cancer    Family history of brain cancer    Rash 01/13/2019   Hemorrhoids 08/01/2018   Anal fissure s/p internal sphincterotomy 06/24/2018 06/24/2018   Prolapsed internal hemorrhoids, grade 2&3, s/p ligation/pexy/hemorrhoidectomy 06/24/2018 06/24/2018    Endometrial cancer ([place]) 06/05/2018   Rectal tear 03/24/2018   Preventative health care 08/09/2017   Depression with anxiety 08/09/2017   Hyperlipidemia LDL goal <100 08/09/2017   Seasonal allergies 08/09/2017   Osteoarthritis of knee 05/29/2017   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/2017   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   Hx of anxiety disorder 04/03/2017   Smoking greater than 30 pack years 04/03/2017   Emphysema of lung ([place]) 04/03/2017   Arteriosclerotic cardiovascular disease (ASCVD) 04/03/2017   Alcoholism in remission ([place]) 04/02/2017   History of transient ischemic attack (TIA) 09/27/2016   Transient cerebral ischemia 09/27/2016   Gastroesophageal reflux disease 09/27/2016   Generalized anxiety disorder 09/27/2016   Gait disturbance 09/23/2016   Leukocytosis 09/23/2016   Primary hypertension 09/23/2016   HLD (hyperlipidemia) 09/23/2016   Former smoker 09/23/2016   Paresthesias 09/22/2016   SINUSITIS- ACUTE-NOS 05/12/2008    PCP: [name], DO  REFERRING PROVIDER: Gaynelle Arabian, MD  REFERRING DIAG:  Diagnosis  [id number] (ICD-10-CM) - Presence of right artificial knee joint    THERAPY DIAG:  Muscle weakness (generalized)  Difficulty in walking, not elsewhere classified  Localized edema  Acute pain of right knee  Rationale for Evaluation and Treatment: Rehabilitation  ONSET DATE: 04/11/22  SUBJECTIVE:   SUBJECTIVE STATEMENT: Patient reports that in the morning she feels like her legs are heavy and fatigued both legs, at rest, no really painful.  Standing causes cramping and fatigue  PERTINENT HISTORY: S/P R TKR Brain aneurysm-clipped, OA R knee, scoliosis, leg length discrepancy, R shorter than L  PAIN:  Are you having pain? Pain in the legs with standing up to 7/10 PRECAUTIONS: None  WEIGHT BEARING RESTRICTIONS: No  FALLS:  Has patient fallen in last 6 months? 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   PLOF: Independent  PATIENT GOALS: Recover fully, walk.  NEXT MD VISIT: 3 weeks  OBJECTIVE:   DIAGNOSTIC FINDINGS: N/A   COGNITION: Overall cognitive status: Within functional limits for tasks assessed     SENSATION: Not tested  EDEMA:  Expected swelling   POSTURE: weight shift left  PALPATION: R thigh was severely indurated above the ace wrap, painful to touch.  LOWER EXTREMITY ROM: WNL except where noted.  Passive ROM Right eval Left eval Right 05/07/22 Right 05/15/22 Right  AROM 05/22/22  Hip flexion       Hip extension       Hip abduction       Hip adduction       Hip internal rotation       Hip external rotation  Knee flexion 85  97 98 100  Knee extension 27  -12 -10 10  Ankle dorsiflexion       Ankle plantarflexion       Ankle inversion       Ankle eversion         LOWER EXTREMITY MMT: LLE 5  MMT Right eval Right 05/15/22  Hip flexion 3- 4  Hip extension    Hip abduction 3- 4  Hip adduction    Hip internal rotation    Hip external rotation    Knee flexion 2+ 4  Knee extension 3- 4  Ankle dorsiflexion 4   Ankle plantarflexion    Ankle inversion    Ankle eversion      GAIT: Distance walked: In clinic distances Assistive device utilized: Walker - 2 wheeled Level of assistance: Modified independence Comments: Patient reports difficulty managing her 2 steps. Educated to correct technique, she has a handle to hold onto.Walked in with max reliance on BUE support, step to gait pattern with RLE slightly ER throughout gait cycle.   TODAY'S TREATMENT:                                                                                                                              DATE:  05/22/22 Nustep level 5 x 5 minutes Gentle  STM to the scar and the knee area, gentle joint distraction, PROM of the right knee flexion and extension SAQ with cues for TKE Bike level 2 full revolutions with some trunk lean Tmill push 20seconds fwd and backward Leg press 20# x10, then no weight working on flexion 2x10 Stairs step over step, gait wihtout device Passive motion to 105 degrees flexion  05/17/22 Bike L1 x 6 minutes Initially had to weight shift strongly to the L to get around Supine PROM for knee ext with heel elevated 5 x 5 sec with overpressure Knee ext pushing into ball, hold x 5 sec , 10 reps Seated knee flexion ROM, contract relax  R knee PROM 6-109 Seated R knee flexion 15#, 2 x 10 reps Seated R knee ext 5#, 2 x 10 reps Standing on BOSU ball, static for balance, no UE support, then mini squats with minimal UE support, x 10 reps Gait training, emphasize increased speed for more normalized gait mechanics-Still demo decreased R stance time, increased lateral trunk sway, sometimes decreased R knee flex in swing.   05/15/22 Progress note- recheck strength, ROM, and goals Stair training in other building NuStep L5 x5 mins PROM- x5 flex/ext Contract relax x5  Leg ext 5# RLE 2x10 HS curls 10# 2x10 Leg press 20# 2x10, progressive lowering seat to 5  Anterior step downs 4" x10 Calf stretch on slant 30s   05/11/22 NuStep L4 x 6 minutes Supine AAROM for R knee ext, heel elevated Knee flexion ROM AAROM 6-103 degrees Knee flexion 15#, BLE, 10 # R only, 2 x 10 Knee ext 5#, eccentric R, 2 x 10  reps Ambulation x 150', VC for more natural knee flexion at HO Bicycle, able to achieve full revolutions with weight shift off hip, 6 minutes L1  05/07/22 NuStep L5 x 6 minutes STM R quad Hamstring stretch 2x30 sec Prone quad stretch with strap 3x30 sec Prone hamstring curl 2x10 Prone hip ext 2x10 Supine hip flexor/quad stretch x30 sec Supine heel slide with strap x10 STM & TPR R quad Seated knee flexion AROM/PROM 3x30  sec with manual over pressure  Seated knee ext AROM/PROM 10x3 sec with manual over pressure Amb x 2 laps around gym -- cues for decreasing wide BOS, knee ext with WB and toe off + knee flexion  05/03/22 NuStep L5 x 6 minutes STM to R knee for quads, HS, calf, fluid  Stretch for knee ext, AROM and AAROM Seated knee flex A and AAROM, 94 flex Seated knee flex against 15#, BLE x 10, RLE 10# 10 reps Seated knee ext BLE 10# 10 reps, R eccentric ext x 10, 5# Amulation focused on normalized gait. Still tends to demonstrate increased L lateral weight shift and decreased WB through R, but improved.  05/01/22 Bike partial revolutions x 6 minutes Supine R knee ext with heel elevated, A, then AAROM 3 x 5 sec,  Seated knee flex with foot planted, then contract/relax R knee AAROM 12-92 STM to lower leg to move fluid out of the tissue. Seated knee flex and ext against Red Tband, x 10 each Step ups onto 6" step with BUE support, on R, 10 forward, 10 lateral Standing knee flexion x 10 for HS strength  04/26/22 NuStep L5 x 6 min R knee PROM flex and ext with end range holds LAQ 2lb 3x10 RLE HS curls red 2x15 Gait w/ SPC 138f decrease R knee flex  Resisted gait 20lb x3 forward backwar 4 in step ups x5 each HHA x1   04/24/22 NuStep L5 x 6 min R knee PROM with end range holds S2S 2x10 Gait [id number] ad, decrease R knee flexion lateral trunk sway, Gait SPC [id number] press 20lb 2x10 LAQ RLE 3lb 3x10 Hamstring curls red RLE 2x10  PATIENT EDUCATION:  Education details: HEP, POC Person educated: Patient and Spouse Education method: Explanation, Demonstration, and Handouts Education comprehension: verbalized understanding and returned demonstration  HOME EXERCISE PROGRAM: [id number]:  CLINICAL IMPRESSION: Patient continues to report pain in the legs, she does seem to limp more on the left leg now, doing better with a natural bend but for a long time she was very stiff legged.  She is  not using an assistive device.  AROM 10-100 degrees flexion, PROM 5-105 degrees flexion, she is frustrated with pain and stiffness  OBJECTIVE IMPAIRMENTS: Abnormal gait, decreased activity tolerance, decreased balance, decreased coordination, decreased mobility, difficulty walking, decreased ROM, decreased strength, increased muscle spasms, impaired flexibility, postural dysfunction, and pain.   ACTIVITY LIMITATIONS: carrying, lifting, bending, sitting, standing, squatting, sleeping, stairs, transfers, bathing, toileting, and locomotion level  PARTICIPATION LIMITATIONS: meal prep, cleaning, laundry, driving, shopping, community activity, and occupation  PERSONAL FACTORS: Age and Past/current experiences are also affecting patient's functional outcome.   REHAB POTENTIAL: Good  CLINICAL DECISION MAKING: Evolving/moderate complexity  EVALUATION COMPLEXITY: Moderate   GOALS: Goals reviewed with patient? Yes  SHORT TERM GOALS: Target date: 05/03/22 I with initial HEP Baseline: Goal status: Met  LONG TERM GOALS: Target date: 07/06/22  I with final HEP Baseline:  Goal status: ongoping  2.  Increase r knee ROM to <5-120 Baseline:  Goal status: ongoing  3.  Increase R knee strength to at least 4+/5 Baseline:  Goal status: ongoing  4.  Patient will be able to walk on level and unlevel surfaces, x at least 500', I, no pain Baseline:  Goal status: IN PROGRESS  5.  Up and down at least 15 steps with UUE support, step over step each direction. Baseline:  Goal status: IN PROGRESS   PLAN:  PT FREQUENCY: 2x/week  PT DURATION: 12 weeks  PLANNED INTERVENTIONS: Therapeutic exercises, Therapeutic activity, Neuromuscular re-education, Balance training, Gait training, Patient/Family education, Self Care, Joint mobilization, Stair training, Dry Needling, Electrical stimulation, Cryotherapy, Moist heat, Vasopneumatic device, Ionotophoresis '4mg'$ /ml Dexamethasone, and Manual therapy  PLAN  FOR NEXT SESSION: continue to progress activity, ROM and function   Lum Babe, PT 05/22/22 10:26 AM

## 2022-05-24 ENCOUNTER — Ambulatory Visit: Payer: BC Managed Care – PPO | Admitting: Physical Therapy

## 2022-05-24 DIAGNOSIS — R262 Difficulty in walking, not elsewhere classified: Secondary | ICD-10-CM

## 2022-05-24 DIAGNOSIS — M25561 Pain in right knee: Secondary | ICD-10-CM

## 2022-05-24 DIAGNOSIS — M6281 Muscle weakness (generalized): Secondary | ICD-10-CM

## 2022-05-24 NOTE — Therapy (Signed)
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   Anxiety    Colon polyps    Depression    Endometrial cancer ([facility])    Grade I   Endometrial polyp    Family history of brain cancer    Family history of prostate cancer    Former smoker 09/23/2016   GERD (gastroesophageal reflux disease)    Nexium   H/O clavicle fracture 2017   Right   High cholesterol    History of bronchitis    History of pneumonia    years ago   Hypertension    LVH (left ventricular hypertrophy) 09/23/2016   Mild, noted on ECHO   Numbness    left fingers after TIA   OA (osteoarthritis)    "right knee"   Osteoporosis 08/2017   T score -2.7   Panic attacks    Postmenopausal bleeding    Thyroid nodule 2019   Multiple   TIA (transient ischemic attack) 09/2016   Wears contact lenses    Past Surgical History:  Procedure Laterality Date   CLAVICLE SURGERY Right 2016   fracture repair   COLONOSCOPY  2017   multiples   CRANIOTOMY FOR ANEURYSM / VERTEBROBASILAR / CAROTID CIRCULATION  09/05/2021   DILATATION & CURETTAGE/HYSTEROSCOPY WITH MYOSURE N/A 05/26/2018   Procedure: DILATATION & CURETTAGE/HYSTEROSCOPY WITH MYOSURE;  Surgeon: Princess Bruins, MD;  Location: [facility];  Service: Gynecology;  Laterality: N/A;   EYE SURGERY Right    "growth on eye removed"   FOOT SURGERY Right 08/2017   benign mass   HEMORRHOID SURGERY N/A 06/24/2018   Procedure: HEMORRHOIDECTOMY;  Surgeon: Michael Boston, MD;  Location: [facility];  Service: General;  Laterality: N/A;   KNEE ARTHROSCOPY Right    Menicus repair   ORIF CLAVICULAR FRACTURE Right 04/28/2015   Procedure: REVISION ORIF RIGHT CLAVICAL FRACTURE, ALLOGRAFT BONE GRAFTING;  Surgeon: Justice Britain, MD;  Location: [facility];  Service: Orthopedics;  Laterality: Right;   Peridontal Laser Surgery  01/2019   due to infection, x 2   PLACEMENT OF BREAST IMPLANTS Bilateral    ROBOTIC ASSISTED TOTAL HYSTERECTOMY WITH BILATERAL SALPINGO OOPHERECTOMY Bilateral 07/10/2018   Procedure: XI ROBOTIC ASSISTED TOTAL HYSTERECTOMY WITH BILATERAL SALPINGO OOPHORECTOMY;  Surgeon: Everitt Amber, MD;  Location: [facility];  Service: Gynecology;  Laterality: Bilateral;   SENTINEL NODE BIOPSY N/A 07/10/2018   Procedure: SENTINEL NODE BIOPSY, LEFT PELVIC NODE;  Surgeon: Everitt Amber, MD;  Location: [facility];  Service: Gynecology;  Laterality: N/A;   SPHINCTEROTOMY N/A 06/24/2018   Procedure: ANAL SPHINCTEROTOMY, ANORECTAL EXAM UNDER ANESTHESIA;  Surgeon: Michael Boston, MD;  Location: [facility];  Service: General;  Laterality: N/A;   TONSILLECTOMY     Patient Active Problem List   Diagnosis Date Noted   Brain aneurysm 07/28/2021   Preop examination 07/07/2021   Eczema 07/07/2021  Myalgia 02/14/2021   Left leg pain 12/28/2020   Bilateral hearing loss 05/07/2020   Depression, major, single episode, moderate ([place]) 05/07/2020   Acute right-sided low back pain without sciatica 09/14/2019   Foul smelling urine 09/14/2019   Family history of colon cancer 02/23/2019   Family history of prostate cancer    Family history of brain cancer    Rash 01/13/2019   Hemorrhoids 08/01/2018   Anal fissure s/p internal sphincterotomy 06/24/2018 06/24/2018   Prolapsed internal hemorrhoids, grade 2&3, s/p ligation/pexy/hemorrhoidectomy 06/24/2018 06/24/2018   Endometrial cancer ([place]) 06/05/2018   Rectal tear 03/24/2018   Preventative health care 08/09/2017   Depression with anxiety 08/09/2017    Hyperlipidemia LDL goal <100 08/09/2017   Seasonal allergies 08/09/2017   Osteoarthritis of knee 05/29/2017   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/2017   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   Hx of anxiety disorder 04/03/2017   Smoking greater than 30 pack years 04/03/2017   Emphysema of lung ([place]) 04/03/2017   Arteriosclerotic cardiovascular disease (ASCVD) 04/03/2017   Alcoholism in remission ([place]) 04/02/2017   History of transient ischemic attack (TIA) 09/27/2016   Transient cerebral ischemia 09/27/2016   Gastroesophageal reflux disease 09/27/2016   Generalized anxiety disorder 09/27/2016   Gait disturbance 09/23/2016   Leukocytosis 09/23/2016   Primary hypertension 09/23/2016   HLD (hyperlipidemia) 09/23/2016   Former smoker 09/23/2016   Paresthesias 09/22/2016   SINUSITIS- ACUTE-NOS 05/12/2008    PCP: [name], DO  REFERRING PROVIDER: Gaynelle Arabian, MD  REFERRING DIAG:  Diagnosis  [phone] (ICD-10-CM) - Presence of right artificial knee joint    THERAPY DIAG:  Muscle weakness (generalized)  Difficulty in walking, not elsewhere classified  Acute pain of right knee  Rationale for Evaluation and Treatment: Rehabilitation  ONSET DATE: 04/11/22  SUBJECTIVE:   SUBJECTIVE STATEMENT: Low energy started multi vitamin. Back at work next week and bought stand up desk PERTINENT HISTORY: S/P R TKR Brain aneurysm-clipped, OA R knee, scoliosis, leg length discrepancy, R shorter than L  PAIN:  Are you having pain? Pain in the legs with standing up to 5/10 PRECAUTIONS: None  WEIGHT BEARING RESTRICTIONS: No  FALLS:  Has patient fallen in last 6 months? 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   PLOF:  Independent  PATIENT GOALS: Recover fully, walk.  NEXT MD VISIT: 3 weeks  OBJECTIVE:   DIAGNOSTIC FINDINGS: N/A   COGNITION: Overall cognitive status: Within functional limits for tasks assessed     SENSATION: Not tested  EDEMA:  Expected swelling   POSTURE: weight shift left  PALPATION: R thigh was severely indurated above the ace wrap, painful to touch.  LOWER EXTREMITY ROM: WNL except where noted.  Passive ROM Right eval Left eval Right 05/07/22 Right 05/15/22 Right  AROM 05/22/22  Hip flexion       Hip extension       Hip abduction       Hip adduction       Hip internal rotation       Hip external rotation       Knee flexion 85  97 98 100  Knee extension 27  -12 -10 10  Ankle dorsiflexion       Ankle plantarflexion       Ankle inversion  Ankle eversion         LOWER EXTREMITY MMT: LLE 5  MMT Right eval Right 05/15/22  Hip flexion 3- 4  Hip extension    Hip abduction 3- 4  Hip adduction    Hip internal rotation    Hip external rotation    Knee flexion 2+ 4  Knee extension 3- 4  Ankle dorsiflexion 4   Ankle plantarflexion    Ankle inversion    Ankle eversion      GAIT: Distance walked: In clinic distances Assistive device utilized: Walker - 2 wheeled Level of assistance: Modified independence Comments: Patient reports difficulty managing her 2 steps. Educated to correct technique, she has a handle to hold onto.Walked in with max reliance on BUE support, step to gait pattern with RLE slightly ER throughout gait cycle.   TODAY'S TREATMENT:                                                                                                                              DATE:   05/24/22 PROM with belt mobs to increase flexion Elliptical 4 min forward Bike L 4 5 min 4 inch step down 10 x, then up over and revers STS HHA on 8 in box on TM 3 sets 5 BOSU black up wt shift and mini squats BOSU black down step up fwd and laterally 10 x each Leg  press 20# x10, then no weight working on flexion 2x10     05/22/22 Nustep level 5 x 5 minutes Gentle STM to the scar and the knee area, gentle joint distraction, PROM of the right knee flexion and extension SAQ with cues for TKE Bike level 2 full revolutions with some trunk lean Tmill push 20seconds fwd and backward Leg press 20# x10, then no weight working on flexion 2x10 Stairs step over step, gait wihtout device Passive motion to 105 degrees flexion  05/17/22 Bike L1 x 6 minutes Initially had to weight shift strongly to the L to get around Supine PROM for knee ext with heel elevated 5 x 5 sec with overpressure Knee ext pushing into ball, hold x 5 sec , 10 reps Seated knee flexion ROM, contract relax  R knee PROM 6-109 Seated R knee flexion 15#, 2 x 10 reps Seated R knee ext 5#, 2 x 10 reps Standing on BOSU ball, static for balance, no UE support, then mini squats with minimal UE support, x 10 reps Gait training, emphasize increased speed for more normalized gait mechanics-Still demo decreased R stance time, increased lateral trunk sway, sometimes decreased R knee flex in swing.   05/15/22 Progress note- recheck strength, ROM, and goals Stair training in other building NuStep L5 x5 mins PROM- x5 flex/ext Contract relax x5  Leg ext 5# RLE 2x10 HS curls 10# 2x10 Leg press 20# 2x10, progressive lowering seat to 5  Anterior step downs 4" x10 Calf stretch on slant 30s   05/11/22 NuStep L4 x 6  minutes Supine AAROM for R knee ext, heel elevated Knee flexion ROM AAROM 6-103 degrees Knee flexion 15#, BLE, 10 # R only, 2 x 10 Knee ext 5#, eccentric R, 2 x 10 reps Ambulation x 150', VC for more natural knee flexion at HO Bicycle, able to achieve full revolutions with weight shift off hip, 6 minutes L1  05/07/22 NuStep L5 x 6 minutes STM R quad Hamstring stretch 2x30 sec Prone quad stretch with strap 3x30 sec Prone hamstring curl 2x10 Prone hip ext 2x10 Supine hip flexor/quad  stretch x30 sec Supine heel slide with strap x10 STM & TPR R quad Seated knee flexion AROM/PROM 3x30 sec with manual over pressure  Seated knee ext AROM/PROM 10x3 sec with manual over pressure Amb x 2 laps around gym -- cues for decreasing wide BOS, knee ext with WB and toe off + knee flexion  05/03/22 NuStep L5 x 6 minutes STM to R knee for quads, HS, calf, fluid  Stretch for knee ext, AROM and AAROM Seated knee flex A and AAROM, 94 flex Seated knee flex against 15#, BLE x 10, RLE 10# 10 reps Seated knee ext BLE 10# 10 reps, R eccentric ext x 10, 5# Amulation focused on normalized gait. Still tends to demonstrate increased L lateral weight shift and decreased WB through R, but improved.  05/01/22 Bike partial revolutions x 6 minutes Supine R knee ext with heel elevated, A, then AAROM 3 x 5 sec,  Seated knee flex with foot planted, then contract/relax R knee AAROM 12-92 STM to lower leg to move fluid out of the tissue. Seated knee flex and ext against Red Tband, x 10 each Step ups onto 6" step with BUE support, on R, 10 forward, 10 lateral Standing knee flexion x 10 for HS strength  04/26/22 NuStep L5 x 6 min R knee PROM flex and ext with end range holds LAQ 2lb 3x10 RLE HS curls red 2x15 Gait w/ SPC 126f decrease R knee flex  Resisted gait 20lb x3 forward backwar 4 in step ups x5 each HHA x1   04/24/22 NuStep L5 x 6 min R knee PROM with end range holds S2S 2x10 Gait [id number] ad, decrease R knee flexion lateral trunk sway, Gait SPC [id number] press 20lb 2x10 LAQ RLE 3lb 3x10 Hamstring curls red RLE 2x10  PATIENT EDUCATION:  Education details: HEP, POC Person educated: Patient and Spouse Education method: Explanation, Demonstration, and Handouts Education comprehension: verbalized understanding and returned demonstration  HOME EXERCISE PROGRAM: [id number]:  CLINICAL IMPRESSION:progressing func ROM and strength OBJECTIVE IMPAIRMENTS: Abnormal gait,  decreased activity tolerance, decreased balance, decreased coordination, decreased mobility, difficulty walking, decreased ROM, decreased strength, increased muscle spasms, impaired flexibility, postural dysfunction, and pain.   ACTIVITY LIMITATIONS: carrying, lifting, bending, sitting, standing, squatting, sleeping, stairs, transfers, bathing, toileting, and locomotion level  PARTICIPATION LIMITATIONS: meal prep, cleaning, laundry, driving, shopping, community activity, and occupation  PERSONAL FACTORS: Age and Past/current experiences are also affecting patient's functional outcome.   REHAB POTENTIAL: Good  CLINICAL DECISION MAKING: Evolving/moderate complexity  EVALUATION COMPLEXITY: Moderate   GOALS: Goals reviewed with patient? Yes  SHORT TERM GOALS: Target date: 05/03/22 I with initial HEP Baseline: Goal status: Met  LONG TERM GOALS: Target date: 07/06/22  I with final HEP Baseline:  Goal status: ongoping  2.  Increase r knee ROM to <5-120 Baseline:  Goal status: ongoing  3.  Increase R knee strength to at least 4+/5 Baseline:  Goal status: ongoing  4.  Patient will be able to walk on level and unlevel surfaces, x at least 500', I, no pain Baseline:  Goal status: IN PROGRESS  5.  Up and down at least 15 steps with UUE support, step over step each direction. Baseline:  Goal status: IN PROGRESS   PLAN:  PT FREQUENCY: 2x/week  PT DURATION: 12 weeks  PLANNED INTERVENTIONS: Therapeutic exercises, Therapeutic activity, Neuromuscular re-education, Balance training, Gait training, Patient/Family education, Self Care, Joint mobilization, Stair training, Dry Needling, Electrical stimulation, Cryotherapy, Moist heat, Vasopneumatic device, Ionotophoresis '4mg'$ /ml Dexamethasone, and Manual therapy  PLAN FOR NEXT SESSION: continue to progress activity, ROM and function    Patient Details  Name: Rebecca Gordon MRN: [id number] Date of Birth: [birth year] Referring  Provider:  [name], *  Encounter Date: 05/24/2022   Laqueta Carina, PTA 05/24/2022, 10:26 AM  [facility] [place], [place], [address] Phone: [phone]   Fax:  [phone]

## 2022-05-29 ENCOUNTER — Encounter: Payer: Self-pay | Admitting: Physical Therapy

## 2022-05-29 ENCOUNTER — Ambulatory Visit: Payer: BC Managed Care – PPO | Admitting: Physical Therapy

## 2022-05-29 DIAGNOSIS — R262 Difficulty in walking, not elsewhere classified: Secondary | ICD-10-CM

## 2022-05-29 DIAGNOSIS — M6281 Muscle weakness (generalized): Secondary | ICD-10-CM | POA: Diagnosis not present

## 2022-05-29 DIAGNOSIS — R600 Localized edema: Secondary | ICD-10-CM

## 2022-05-29 DIAGNOSIS — R278 Other lack of coordination: Secondary | ICD-10-CM

## 2022-05-29 DIAGNOSIS — G8929 Other chronic pain: Secondary | ICD-10-CM

## 2022-05-29 DIAGNOSIS — M25561 Pain in right knee: Secondary | ICD-10-CM

## 2022-05-29 DIAGNOSIS — R293 Abnormal posture: Secondary | ICD-10-CM

## 2022-05-29 DIAGNOSIS — M5441 Lumbago with sciatica, right side: Secondary | ICD-10-CM

## 2022-05-29 NOTE — Therapy (Signed)
OUTPATIENT PHYSICAL THERAPY LOWER EXTREMITY    Patient Name: Rebecca Gordon MRN: [id number] DOB:1956/01/24, 67 y.o., female Today's Date: 05/29/2022  END OF SESSION:  PT End of Session - 05/29/22 1023     Visit Number 14    Date for PT Re-Evaluation 07/06/22    PT Start Time 1017    PT Stop Time 1056    PT Time Calculation (min) 39 min    Activity Tolerance Patient tolerated treatment well;Patient limited by pain    Behavior During Therapy [facility] for tasks assessed/performed             Past Medical History:  Diagnosis Date   Alcoholism ([place])    Anal fissure    Aneurysm ([place]) 09/2016   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   Anxiety    Colon polyps    Depression    Endometrial cancer ([place])    Grade I   Endometrial polyp    Family history of brain cancer    Family history of prostate cancer    Former smoker 09/23/2016   GERD (gastroesophageal reflux disease)    Nexium   H/O clavicle fracture 2017   Right   High cholesterol    History of bronchitis    History of pneumonia    years ago   Hypertension    LVH (left ventricular hypertrophy) 09/23/2016   Mild, noted on ECHO   Numbness    left fingers after TIA   OA (osteoarthritis)    "right knee"   Osteoporosis 08/2017   T score -2.7   Panic attacks    Postmenopausal bleeding    Thyroid nodule 2019   Multiple   TIA (transient ischemic attack) 09/2016   Wears contact lenses    Past Surgical History:  Procedure Laterality Date   CLAVICLE SURGERY Right 2016   fracture repair   COLONOSCOPY  2017   multiples   CRANIOTOMY FOR ANEURYSM / VERTEBROBASILAR / CAROTID CIRCULATION  09/05/2021   DILATATION & CURETTAGE/HYSTEROSCOPY WITH MYOSURE N/A 05/26/2018   Procedure: DILATATION & CURETTAGE/HYSTEROSCOPY WITH MYOSURE;  Surgeon: Princess Bruins, MD;  Location: [place];  Service: Gynecology;  Laterality: N/A;   EYE SURGERY Right    "growth on eye removed"   FOOT SURGERY Right 08/2017    benign mass   HEMORRHOID SURGERY N/A 06/24/2018   Procedure: HEMORRHOIDECTOMY;  Surgeon: Michael Boston, MD;  Location: [place];  Service: General;  Laterality: N/A;   KNEE ARTHROSCOPY Right    Menicus repair   ORIF CLAVICULAR FRACTURE Right 04/28/2015   Procedure: REVISION ORIF RIGHT CLAVICAL FRACTURE, ALLOGRAFT BONE GRAFTING;  Surgeon: Justice Britain, MD;  Location: [place];  Service: Orthopedics;  Laterality: Right;   Peridontal Laser Surgery  01/2019   due to infection, x 2   PLACEMENT OF BREAST IMPLANTS Bilateral    ROBOTIC ASSISTED TOTAL HYSTERECTOMY WITH BILATERAL SALPINGO OOPHERECTOMY Bilateral 07/10/2018   Procedure: XI ROBOTIC ASSISTED TOTAL HYSTERECTOMY WITH BILATERAL SALPINGO OOPHORECTOMY;  Surgeon: Everitt Amber, MD;  Location: [facility];  Service: Gynecology;  Laterality: Bilateral;   SENTINEL NODE BIOPSY N/A 07/10/2018   Procedure: SENTINEL NODE BIOPSY, LEFT PELVIC NODE;  Surgeon: Everitt Amber, MD;  Location: [facility];  Service: Gynecology;  Laterality: N/A;   SPHINCTEROTOMY N/A 06/24/2018   Procedure: ANAL SPHINCTEROTOMY, ANORECTAL EXAM UNDER ANESTHESIA;  Surgeon: Michael Boston, MD;  Location: [place];  Service: General;  Laterality: N/A;   TONSILLECTOMY     Patient Active  Problem List   Diagnosis Date Noted   Brain aneurysm 07/28/2021   Preop examination 07/07/2021   Eczema 07/07/2021   Myalgia 02/14/2021   Left leg pain 12/28/2020   Bilateral hearing loss 05/07/2020   Depression, major, single episode, moderate ([place]) 05/07/2020   Acute right-sided low back pain without sciatica 09/14/2019   Foul smelling urine 09/14/2019   Family history of colon cancer 02/23/2019   Family history of prostate cancer    Family history of brain cancer    Rash 01/13/2019   Hemorrhoids 08/01/2018   Anal fissure s/p internal sphincterotomy 06/24/2018 06/24/2018   Prolapsed internal hemorrhoids, grade 2&3, s/p ligation/pexy/hemorrhoidectomy 06/24/2018 06/24/2018    Endometrial cancer ([place]) 06/05/2018   Rectal tear 03/24/2018   Preventative health care 08/09/2017   Depression with anxiety 08/09/2017   Hyperlipidemia LDL goal <100 08/09/2017   Seasonal allergies 08/09/2017   Osteoarthritis of knee 05/29/2017   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[place]) 04/03/2017   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   Hx of anxiety disorder 04/03/2017   Smoking greater than 30 pack years 04/03/2017   Emphysema of lung ([place]) 04/03/2017   Arteriosclerotic cardiovascular disease (ASCVD) 04/03/2017   Alcoholism in remission ([place]) 04/02/2017   History of transient ischemic attack (TIA) 09/27/2016   Transient cerebral ischemia 09/27/2016   Gastroesophageal reflux disease 09/27/2016   Generalized anxiety disorder 09/27/2016   Gait disturbance 09/23/2016   Leukocytosis 09/23/2016   Primary hypertension 09/23/2016   HLD (hyperlipidemia) 09/23/2016   Former smoker 09/23/2016   Paresthesias 09/22/2016   SINUSITIS- ACUTE-NOS 05/12/2008    PCP: [name], DO  REFERRING PROVIDER: Gaynelle Arabian, MD  REFERRING DIAG:  Diagnosis  [id number] (ICD-10-CM) - Presence of right artificial knee joint    THERAPY DIAG:  Muscle weakness (generalized)  Difficulty in walking, not elsewhere classified  Acute pain of right knee  Chronic pain of right knee  Other lack of coordination  Localized edema  Abnormal posture  Acute bilateral low back pain with bilateral sciatica  Rationale for Evaluation and Treatment: Rehabilitation  ONSET DATE: 04/11/22  SUBJECTIVE:   SUBJECTIVE STATEMENT: Patient back to work, trying to stand and move every hour. PERTINENT HISTORY: S/P R TKR Brain aneurysm-clipped, OA R knee, scoliosis, leg length discrepancy, R shorter than L  PAIN:  Are you having pain? Pain in the legs with standing up to 5/10 PRECAUTIONS: None  WEIGHT BEARING RESTRICTIONS: No  FALLS:  Has patient fallen in last 6 months? 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   PLOF: Independent  PATIENT GOALS: Recover fully, walk.  NEXT MD VISIT: 3 weeks  OBJECTIVE:   DIAGNOSTIC FINDINGS: N/A   COGNITION: Overall cognitive status: Within functional limits for tasks assessed     SENSATION: Not tested  EDEMA:  Expected swelling   POSTURE: weight shift left  PALPATION: R thigh was severely indurated above the ace wrap, painful to touch.  LOWER EXTREMITY ROM: WNL except where noted.  Passive ROM Right eval Left eval Right 05/07/22 Right 05/15/22 Right  AROM 05/22/22  Hip flexion       Hip extension       Hip abduction       Hip adduction       Hip internal rotation  Hip external rotation       Knee flexion 85  97 98 100  Knee extension 27  -12 -10 10  Ankle dorsiflexion       Ankle plantarflexion       Ankle inversion       Ankle eversion         LOWER EXTREMITY MMT: LLE 5  MMT Right eval Right 05/15/22  Hip flexion 3- 4  Hip extension    Hip abduction 3- 4  Hip adduction    Hip internal rotation    Hip external rotation    Knee flexion 2+ 4  Knee extension 3- 4  Ankle dorsiflexion 4   Ankle plantarflexion    Ankle inversion    Ankle eversion      GAIT: Distance walked: In clinic distances Assistive device utilized: Walker - 2 wheeled Level of assistance: Modified independence Comments: Patient reports difficulty managing her 2 steps. Educated to correct technique, she has a handle to hold onto.Walked in with max reliance on BUE support, step to gait pattern with RLE slightly ER throughout gait cycle.   TODAY'S TREATMENT:                                                                                                                              DATE:   05/29/22 Bike L1 x 6 min R knee STM to quads, HS, patellar AAROM to R knee flex and ext R knee PROM 3-110 Seated knee flex, B 25#, x 10, 2 x 10 R, 15# Seated knee ext BLE x 10, 10#, then eccentric on R x 10, 10# Walking on treadmill, no power, 30 seconds Treadmill push with RLE x 15. SLS on air ex pad x up to 7 seconds SLS on the floor x up to 10 sec.  05/24/22 PROM with belt mobs to increase flexion Elliptical 4 min forward Bike L 4 5 min 4 inch step down 10 x, then up over and revers STS HHA on 8 in box on TM 3 sets 5 BOSU black up wt shift and mini squats BOSU black down step up fwd and laterally 10 x each Leg press 20# x10, then no weight working on flexion 2x10  05/22/22 Nustep level 5 x 5 minutes Gentle STM to the scar and the knee area, gentle joint distraction, PROM of the right knee flexion and extension SAQ with cues for TKE Bike level 2 full revolutions with some trunk lean Tmill push 20seconds fwd and backward Leg press 20# x10, then no weight working on flexion 2x10 Stairs step over step, gait wihtout device Passive motion to 105 degrees flexion  05/17/22 Bike L1 x 6 minutes Initially had to weight shift strongly to the L to get around Supine PROM for knee ext with heel elevated 5 x 5 sec with overpressure Knee ext pushing into ball, hold x 5 sec , 10 reps Seated knee flexion ROM, contract relax  R knee  PROM 6-109 Seated R knee flexion 15#, 2 x 10 reps Seated R knee ext 5#, 2 x 10 reps Standing on BOSU ball, static for balance, no UE support, then mini squats with minimal UE support, x 10 reps Gait training, emphasize increased speed for more normalized gait mechanics-Still demo decreased R stance time, increased lateral trunk sway, sometimes decreased R knee flex in swing.   05/15/22 Progress note- recheck strength, ROM, and goals Stair training in other building NuStep L5 x5 mins PROM- x5 flex/ext Contract relax x5  Leg ext 5# RLE 2x10 HS curls 10#  2x10 Leg press 20# 2x10, progressive lowering seat to 5  Anterior step downs 4" x10 Calf stretch on slant 30s   05/11/22 NuStep L4 x 6 minutes Supine AAROM for R knee ext, heel elevated Knee flexion ROM AAROM 6-103 degrees Knee flexion 15#, BLE, 10 # R only, 2 x 10 Knee ext 5#, eccentric R, 2 x 10 reps Ambulation x 150', VC for more natural knee flexion at HO Bicycle, able to achieve full revolutions with weight shift off hip, 6 minutes L1  05/07/22 NuStep L5 x 6 minutes STM R quad Hamstring stretch 2x30 sec Prone quad stretch with strap 3x30 sec Prone hamstring curl 2x10 Prone hip ext 2x10 Supine hip flexor/quad stretch x30 sec Supine heel slide with strap x10 STM & TPR R quad Seated knee flexion AROM/PROM 3x30 sec with manual over pressure  Seated knee ext AROM/PROM 10x3 sec with manual over pressure Amb x 2 laps around gym -- cues for decreasing wide BOS, knee ext with WB and toe off + knee flexion  05/03/22 NuStep L5 x 6 minutes STM to R knee for quads, HS, calf, fluid  Stretch for knee ext, AROM and AAROM Seated knee flex A and AAROM, 94 flex Seated knee flex against 15#, BLE x 10, RLE 10# 10 reps Seated knee ext BLE 10# 10 reps, R eccentric ext x 10, 5# Amulation focused on normalized gait. Still tends to demonstrate increased L lateral weight shift and decreased WB through R, but improved.  05/01/22 Bike partial revolutions x 6 minutes Supine R knee ext with heel elevated, A, then AAROM 3 x 5 sec,  Seated knee flex with foot planted, then contract/relax R knee AAROM 12-92 STM to lower leg to move fluid out of the tissue. Seated knee flex and ext against Red Tband, x 10 each Step ups onto 6" step with BUE support, on R, 10 forward, 10 lateral Standing knee flexion x 10 for HS strength  04/26/22 NuStep L5 x 6 min R knee PROM flex and ext with end range holds LAQ 2lb 3x10 RLE HS curls red 2x15 Gait w/ SPC 149f decrease R knee flex  Resisted gait 20lb x3 forward  backwar 4 in step ups x5 each HHA x1   04/24/22 NuStep L5 x 6 min R knee PROM with end range holds S2S 2x10 Gait [id number] ad, decrease R knee flexion lateral trunk sway, Gait SPC [id number] press 20lb 2x10 LAQ RLE 3lb 3x10 Hamstring curls red RLE 2x10  PATIENT EDUCATION:  Education details: HEP, POC Person educated: Patient and Spouse Education method: Explanation, Demonstration, and Handouts Education comprehension: verbalized understanding and returned demonstration  HOME EXERCISE PROGRAM: [id number]:  CLINICAL IMPRESSION:Knee ext near normal, knee flexion still tight. Improving strength noted. Treatment emphasized all of the above as well as NM re-ed for balance. She reports R patellar pain. TTP and pressure, mild patellar tendon pain.  OBJECTIVE IMPAIRMENTS: Abnormal gait, decreased activity tolerance, decreased balance, decreased coordination, decreased mobility, difficulty walking, decreased ROM, decreased strength, increased muscle spasms, impaired flexibility, postural dysfunction, and pain.   ACTIVITY LIMITATIONS: carrying, lifting, bending, sitting, standing, squatting, sleeping, stairs, transfers, bathing, toileting, and locomotion level  PARTICIPATION LIMITATIONS: meal prep, cleaning, laundry, driving, shopping, community activity, and occupation  PERSONAL FACTORS: Age and Past/current experiences are also affecting patient's functional outcome.   REHAB POTENTIAL: Good  CLINICAL DECISION MAKING: Evolving/moderate complexity  EVALUATION COMPLEXITY: Moderate   GOALS: Goals reviewed with patient? Yes  SHORT TERM GOALS: Target date: 05/03/22 I with initial HEP Baseline: Goal status: Met  LONG TERM GOALS: Target date: 07/06/22  I with final HEP Baseline:  Goal status: ongoping  2.  Increase r knee ROM to <5-120 Baseline:  Goal status: 05/29/22 ongoing  3.  Increase R knee strength to at least 4+/5 Baseline:  Goal status: ongoing  4.  Patient  will be able to walk on level and unlevel surfaces, x at least 500', I, no pain Baseline:  Goal status: 05/29/22 ongoing  5.  Up and down at least 15 steps with UUE support, step over step each direction. Baseline:  Goal status: IN PROGRESS   PLAN:  PT FREQUENCY: 2x/week  PT DURATION: 12 weeks  PLANNED INTERVENTIONS: Therapeutic exercises, Therapeutic activity, Neuromuscular re-education, Balance training, Gait training, Patient/Family education, Self Care, Joint mobilization, Stair training, Dry Needling, Electrical stimulation, Cryotherapy, Moist heat, Vasopneumatic device, Ionotophoresis 81m/ml Dexamethasone, and Manual therapy  PLAN FOR NEXT SESSION: continue to progress activity, ROM and function  Patient Details  Name: DWakisha MatsenMRN: [id number] of Birth: 805/29/1957Referring Provider:  LAnn Held *  Encounter Date: 05/29/2022  SEthel RanaDPT 05/29/22 11:00 AM   [place] [address]Phone: [phone]  Fax:  [phone]

## 2022-05-31 ENCOUNTER — Ambulatory Visit: Payer: BC Managed Care – PPO | Admitting: Physical Therapy

## 2022-05-31 DIAGNOSIS — R262 Difficulty in walking, not elsewhere classified: Secondary | ICD-10-CM

## 2022-05-31 DIAGNOSIS — M6281 Muscle weakness (generalized): Secondary | ICD-10-CM | POA: Diagnosis not present

## 2022-05-31 NOTE — Therapy (Signed)
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   Anxiety    Colon polyps    Depression    Endometrial cancer ([facility])    Grade I   Endometrial polyp    Family history of brain cancer    Family history of prostate cancer    Former smoker 09/23/2016   GERD (gastroesophageal reflux disease)    Nexium   H/O clavicle fracture 2017   Right   High cholesterol    History of bronchitis    History of pneumonia    years ago   Hypertension    LVH (left ventricular hypertrophy) 09/23/2016   Mild, noted on ECHO   Numbness    left fingers after TIA   OA (osteoarthritis)    "right knee"   Osteoporosis 08/2017   T score -2.7   Panic attacks    Postmenopausal bleeding    Thyroid nodule 2019   Multiple   TIA (transient ischemic attack) 09/2016   Wears contact lenses    Past Surgical History:  Procedure Laterality Date   CLAVICLE SURGERY Right 2016   fracture repair   COLONOSCOPY  2017   multiples   CRANIOTOMY FOR ANEURYSM / VERTEBROBASILAR / CAROTID CIRCULATION  09/05/2021   DILATATION & CURETTAGE/HYSTEROSCOPY WITH MYOSURE N/A 05/26/2018   Procedure: DILATATION & CURETTAGE/HYSTEROSCOPY WITH MYOSURE;  Surgeon: Princess Bruins, MD;  Location: [facility];  Service: Gynecology;  Laterality: N/A;   EYE SURGERY Right    "growth on eye removed"   FOOT SURGERY Right 08/2017   benign mass   HEMORRHOID SURGERY N/A 06/24/2018   Procedure: HEMORRHOIDECTOMY;  Surgeon: Michael Boston, MD;  Location: [facility];  Service: General;  Laterality: N/A;   KNEE ARTHROSCOPY Right    Menicus repair   ORIF CLAVICULAR FRACTURE Right 04/28/2015   Procedure: REVISION ORIF RIGHT CLAVICAL FRACTURE, ALLOGRAFT BONE GRAFTING;  Surgeon: Justice Britain, MD;  Location: [facility];  Service: Orthopedics;  Laterality: Right;   Peridontal Laser Surgery  01/2019   due to infection, x 2   PLACEMENT OF BREAST IMPLANTS Bilateral    ROBOTIC ASSISTED TOTAL HYSTERECTOMY WITH BILATERAL SALPINGO OOPHERECTOMY Bilateral 07/10/2018   Procedure: XI ROBOTIC ASSISTED TOTAL HYSTERECTOMY WITH BILATERAL SALPINGO OOPHORECTOMY;  Surgeon: Everitt Amber, MD;  Location: [facility];  Service: Gynecology;  Laterality: Bilateral;   SENTINEL NODE BIOPSY N/A 07/10/2018   Procedure: SENTINEL NODE BIOPSY, LEFT PELVIC NODE;  Surgeon: Everitt Amber, MD;  Location: [facility];  Service: Gynecology;  Laterality: N/A;   SPHINCTEROTOMY N/A 06/24/2018   Procedure: ANAL SPHINCTEROTOMY, ANORECTAL EXAM UNDER ANESTHESIA;  Surgeon: Michael Boston, MD;  Location: [facility];  Service: General;  Laterality: N/A;   TONSILLECTOMY     Patient Active Problem List   Diagnosis Date Noted   Brain aneurysm 07/28/2021   Preop examination 07/07/2021   Eczema 07/07/2021  Myalgia 02/14/2021   Left leg pain 12/28/2020   Bilateral hearing loss 05/07/2020   Depression, major, single episode, moderate ([place]) 05/07/2020   Acute right-sided low back pain without sciatica 09/14/2019   Foul smelling urine 09/14/2019   Family history of colon cancer 02/23/2019   Family history of prostate cancer    Family history of brain cancer    Rash 01/13/2019   Hemorrhoids 08/01/2018   Anal fissure s/p internal sphincterotomy 06/24/2018 06/24/2018   Prolapsed internal hemorrhoids, grade 2&3, s/p ligation/pexy/hemorrhoidectomy 06/24/2018 06/24/2018   Endometrial cancer ([place]) 06/05/2018   Rectal tear 03/24/2018   Preventative health care 08/09/2017   Depression with anxiety 08/09/2017    Hyperlipidemia LDL goal <100 08/09/2017   Seasonal allergies 08/09/2017   Osteoarthritis of knee 05/29/2017   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[place]) 04/03/2017   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   Hx of anxiety disorder 04/03/2017   Smoking greater than 30 pack years 04/03/2017   Emphysema of lung ([place]) 04/03/2017   Arteriosclerotic cardiovascular disease (ASCVD) 04/03/2017   Alcoholism in remission ([place]) 04/02/2017   History of transient ischemic attack (TIA) 09/27/2016   Transient cerebral ischemia 09/27/2016   Gastroesophageal reflux disease 09/27/2016   Generalized anxiety disorder 09/27/2016   Gait disturbance 09/23/2016   Leukocytosis 09/23/2016   Primary hypertension 09/23/2016   HLD (hyperlipidemia) 09/23/2016   Former smoker 09/23/2016   Paresthesias 09/22/2016   SINUSITIS- ACUTE-NOS 05/12/2008    PCP: [name], DO  REFERRING PROVIDER: Gaynelle Arabian, MD  REFERRING DIAG:  Diagnosis  [phone] (ICD-10-CM) - Presence of right artificial knee joint    THERAPY DIAG:  Muscle weakness (generalized)  Difficulty in walking, not elsewhere classified  Rationale for Evaluation and Treatment: Rehabilitation  ONSET DATE: 04/11/22  SUBJECTIVE:   SUBJECTIVE STATEMENT: Still fearful down steps step over step. Exhausted with RTW PERTINENT HISTORY: S/P R TKR Brain aneurysm-clipped, OA R knee, scoliosis, leg length discrepancy, R shorter than L  PAIN:  Are you having pain? Pain in the legs with standing up to 5/10 PRECAUTIONS: None  WEIGHT BEARING RESTRICTIONS: No  FALLS:  Has patient fallen in last 6 months? 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   PLOF: Independent  PATIENT GOALS: Recover fully, walk.  NEXT  MD VISIT: 3 weeks  OBJECTIVE:   DIAGNOSTIC FINDINGS: N/A   COGNITION: Overall cognitive status: Within functional limits for tasks assessed     SENSATION: Not tested  EDEMA:  Expected swelling   POSTURE: weight shift left  PALPATION: R thigh was severely indurated above the ace wrap, painful to touch.  LOWER EXTREMITY ROM: WNL except where noted.  Passive ROM Right eval Left eval Right 05/07/22 Right 05/15/22 Right  AROM 05/22/22  Hip flexion       Hip extension       Hip abduction       Hip adduction       Hip internal rotation       Hip external rotation       Knee flexion 85  97 98 100  Knee extension 27  -12 -10 10  Ankle dorsiflexion       Ankle plantarflexion       Ankle inversion       Ankle eversion  LOWER EXTREMITY MMT: LLE 5  MMT Right eval Right 05/15/22  Hip flexion 3- 4  Hip extension    Hip abduction 3- 4  Hip adduction    Hip internal rotation    Hip external rotation    Knee flexion 2+ 4  Knee extension 3- 4  Ankle dorsiflexion 4   Ankle plantarflexion    Ankle inversion    Ankle eversion      GAIT: Distance walked: In clinic distances Assistive device utilized: Walker - 2 wheeled Level of assistance: Modified independence Comments: Patient reports difficulty managing her 2 steps. Educated to correct technique, she has a handle to hold onto.Walked in with max reliance on BUE support, step to gait pattern with RLE slightly ER throughout gait cycle.   TODAY'S TREATMENT:                                                                                                                              DATE:   05/31/22 Nustep L 5 6 min LE only 40# cable press down 2 sets 10 30# running man fwd and laterally 20 x each 6 inch  RT heel tap down 2 sets 10 UE support BOSU step up 10 x fwd, 10 x laterally RT Leg press seat 7 SL press 20# 10 x, then 10 x 2 sets unlocked for ROM Leg press 30# 2 sets 10 calf raises HS curl RT LE only 2  sets 10 20# LAQ RT LE only 5# 2 sets 10 Passive stretching into flexion   05/29/22 Bike L1 x 6 min R knee STM to quads, HS, patellar AAROM to R knee flex and ext R knee PROM 3-110 Seated knee flex, B 25#, x 10, 2 x 10 R, 15# Seated knee ext BLE x 10, 10#, then eccentric on R x 10, 10# Walking on treadmill, no power, 30 seconds Treadmill push with RLE x 15. SLS on air ex pad x up to 7 seconds SLS on the floor x up to 10 sec.  05/24/22 PROM with belt mobs to increase flexion Elliptical 4 min forward Bike L 4 5 min 4 inch step down 10 x, then up over and revers STS HHA on 8 in box on TM 3 sets 5 BOSU black up wt shift and mini squats BOSU black down step up fwd and laterally 10 x each Leg press 20# x10, then no weight working on flexion 2x10  05/22/22 Nustep level 5 x 5 minutes Gentle STM to the scar and the knee area, gentle joint distraction, PROM of the right knee flexion and extension SAQ with cues for TKE Bike level 2 full revolutions with some trunk lean Tmill push 20seconds fwd and backward Leg press 20# x10, then no weight working on flexion 2x10 Stairs step over step, gait wihtout device Passive motion to 105 degrees flexion  05/17/22 Bike L1 x 6 minutes Initially had to weight shift strongly to the L to get  around Supine PROM for knee ext with heel elevated 5 x 5 sec with overpressure Knee ext pushing into ball, hold x 5 sec , 10 reps Seated knee flexion ROM, contract relax  R knee PROM 6-109 Seated R knee flexion 15#, 2 x 10 reps Seated R knee ext 5#, 2 x 10 reps Standing on BOSU ball, static for balance, no UE support, then mini squats with minimal UE support, x 10 reps Gait training, emphasize increased speed for more normalized gait mechanics-Still demo decreased R stance time, increased lateral trunk sway, sometimes decreased R knee flex in swing.   05/15/22 Progress note- recheck strength, ROM, and goals Stair training in other building NuStep L5 x5  mins PROM- x5 flex/ext Contract relax x5  Leg ext 5# RLE 2x10 HS curls 10# 2x10 Leg press 20# 2x10, progressive lowering seat to 5  Anterior step downs 4" x10 Calf stretch on slant 30s   05/11/22 NuStep L4 x 6 minutes Supine AAROM for R knee ext, heel elevated Knee flexion ROM AAROM 6-103 degrees Knee flexion 15#, BLE, 10 # R only, 2 x 10 Knee ext 5#, eccentric R, 2 x 10 reps Ambulation x 150', VC for more natural knee flexion at HO Bicycle, able to achieve full revolutions with weight shift off hip, 6 minutes L1  05/07/22 NuStep L5 x 6 minutes STM R quad Hamstring stretch 2x30 sec Prone quad stretch with strap 3x30 sec Prone hamstring curl 2x10 Prone hip ext 2x10 Supine hip flexor/quad stretch x30 sec Supine heel slide with strap x10 STM & TPR R quad Seated knee flexion AROM/PROM 3x30 sec with manual over pressure  Seated knee ext AROM/PROM 10x3 sec with manual over pressure Amb x 2 laps around gym -- cues for decreasing wide BOS, knee ext with WB and toe off + knee flexion  05/03/22 NuStep L5 x 6 minutes STM to R knee for quads, HS, calf, fluid  Stretch for knee ext, AROM and AAROM Seated knee flex A and AAROM, 94 flex Seated knee flex against 15#, BLE x 10, RLE 10# 10 reps Seated knee ext BLE 10# 10 reps, R eccentric ext x 10, 5# Amulation focused on normalized gait. Still tends to demonstrate increased L lateral weight shift and decreased WB through R, but improved.  05/01/22 Bike partial revolutions x 6 minutes Supine R knee ext with heel elevated, A, then AAROM 3 x 5 sec,  Seated knee flex with foot planted, then contract/relax R knee AAROM 12-92 STM to lower leg to move fluid out of the tissue. Seated knee flex and ext against Red Tband, x 10 each Step ups onto 6" step with BUE support, on R, 10 forward, 10 lateral Standing knee flexion x 10 for HS strength  04/26/22 NuStep L5 x 6 min R knee PROM flex and ext with end range holds LAQ 2lb 3x10 RLE HS curls  red 2x15 Gait w/ SPC 181f decrease R knee flex  Resisted gait 20lb x3 forward backwar 4 in step ups x5 each HHA x1   04/24/22 NuStep L5 x 6 min R knee PROM with end range holds S2S 2x10 Gait [id number] ad, decrease R knee flexion lateral trunk sway, Gait SPC [id number] press 20lb 2x10 LAQ RLE 3lb 3x10 Hamstring curls red RLE 2x10  PATIENT EDUCATION:  Education details: HEP, POC Person educated: Patient and Spouse Education method: Explanation, Demonstration, and Handouts Education comprehension: verbalized understanding and returned demonstration  HOME EXERCISE PROGRAM: [id number]:  CLINICAL IMPRESSION:continue  to focus in ROM and strengthening with cuing to not compensate ( verb and tactile ) OBJECTIVE IMPAIRMENTS: Abnormal gait, decreased activity tolerance, decreased balance, decreased coordination, decreased mobility, difficulty walking, decreased ROM, decreased strength, increased muscle spasms, impaired flexibility, postural dysfunction, and pain.   ACTIVITY LIMITATIONS: carrying, lifting, bending, sitting, standing, squatting, sleeping, stairs, transfers, bathing, toileting, and locomotion level  PARTICIPATION LIMITATIONS: meal prep, cleaning, laundry, driving, shopping, community activity, and occupation  PERSONAL FACTORS: Age and Past/current experiences are also affecting patient's functional outcome.   REHAB POTENTIAL: Good  CLINICAL DECISION MAKING: Evolving/moderate complexity  EVALUATION COMPLEXITY: Moderate   GOALS: Goals reviewed with patient? Yes  SHORT TERM GOALS: Target date: 05/03/22 I with initial HEP Baseline: Goal status: Met  LONG TERM GOALS: Target date: 07/06/22  I with final HEP Baseline:  Goal status: ongoping  2.  Increase r knee ROM to <5-120 Baseline:  Goal status: 05/29/22 ongoing  3.  Increase R knee strength to at least 4+/5 Baseline:  Goal status: ongoing  4.  Patient will be able to walk on level and unlevel  surfaces, x at least 500', I, no pain Baseline:  Goal status: 05/29/22 ongoing  5.  Up and down at least 15 steps with UUE support, step over step each direction. Baseline:  Goal status: IN PROGRESS   PLAN:  PT FREQUENCY: 2x/week  PT DURATION: 12 weeks  PLANNED INTERVENTIONS: Therapeutic exercises, Therapeutic activity, Neuromuscular re-education, Balance training, Gait training, Patient/Family education, Self Care, Joint mobilization, Stair training, Dry Needling, Electrical stimulation, Cryotherapy, Moist heat, Vasopneumatic device, Ionotophoresis 80m/ml Dexamethasone, and Manual therapy  PLAN FOR NEXT SESSION: continue to progress activity, ROM and function  Patient Details  Name: Rebecca DeblancMRN: [id number] of Birth: 8Jan 30, 1957Referring Provider:  LAnn Gordon *  Encounter Date: 05/31/2022   PLaqueta Carina PTA 05/31/2022, 10:22 AM  [place] [address]Phone: [phone]  Fax:  [phone]

## 2022-06-01 NOTE — Telephone Encounter (Signed)
Princess Bruins, MD sent to Enrigue Catena, RMA Caller: Unspecified (2 weeks ago) Restart on Prolia.  Ca++ level as needed. Dr Dellis Filbert

## 2022-06-01 NOTE — Telephone Encounter (Signed)
Left message for pt to return my call.

## 2022-06-05 ENCOUNTER — Ambulatory Visit: Payer: BC Managed Care – PPO | Admitting: Physical Therapy

## 2022-06-05 ENCOUNTER — Ambulatory Visit: Payer: Self-pay

## 2022-06-06 ENCOUNTER — Encounter: Payer: Self-pay | Admitting: Physical Therapy

## 2022-06-06 ENCOUNTER — Ambulatory Visit: Payer: BC Managed Care – PPO | Admitting: Physical Therapy

## 2022-06-06 DIAGNOSIS — M6281 Muscle weakness (generalized): Secondary | ICD-10-CM | POA: Diagnosis not present

## 2022-06-06 DIAGNOSIS — R262 Difficulty in walking, not elsewhere classified: Secondary | ICD-10-CM

## 2022-06-06 DIAGNOSIS — G8929 Other chronic pain: Secondary | ICD-10-CM

## 2022-06-06 DIAGNOSIS — M25561 Pain in right knee: Secondary | ICD-10-CM

## 2022-06-06 NOTE — Therapy (Signed)
OUTPATIENT PHYSICAL THERAPY LOWER EXTREMITY    Patient Name: Rebecca Gordon MRN: [id number] DOB:04-26-1955, 67 y.o., female Today's Date: 06/06/2022  END OF SESSION:  PT End of Session - 06/06/22 1019     Visit Number 16    Date for PT Re-Evaluation 07/06/22    PT Start Time 1016    PT Stop Time 1055    PT Time Calculation (min) 39 min    Activity Tolerance Patient tolerated treatment well;Patient limited by pain    Behavior During Therapy [facility] for tasks assessed/performed             Past Medical History:  Diagnosis Date   Alcoholism ([place])    Anal fissure    Aneurysm ([place]) 09/2016   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   Anxiety    Colon polyps    Depression    Endometrial cancer ([place])    Grade I   Endometrial polyp    Family history of brain cancer    Family history of prostate cancer    Former smoker 09/23/2016   GERD (gastroesophageal reflux disease)    Nexium   H/O clavicle fracture 2017   Right   High cholesterol    History of bronchitis    History of pneumonia    years ago   Hypertension    LVH (left ventricular hypertrophy) 09/23/2016   Mild, noted on ECHO   Numbness    left fingers after TIA   OA (osteoarthritis)    "right knee"   Osteoporosis 08/2017   T score -2.7   Panic attacks    Postmenopausal bleeding    Thyroid nodule 2019   Multiple   TIA (transient ischemic attack) 09/2016   Wears contact lenses    Past Surgical History:  Procedure Laterality Date   CLAVICLE SURGERY Right 2016   fracture repair   COLONOSCOPY  2017   multiples   CRANIOTOMY FOR ANEURYSM / VERTEBROBASILAR / CAROTID CIRCULATION  09/05/2021   DILATATION & CURETTAGE/HYSTEROSCOPY WITH MYOSURE N/A 05/26/2018   Procedure: DILATATION & CURETTAGE/HYSTEROSCOPY WITH MYOSURE;  Surgeon: Princess Bruins, MD;  Location: [place];  Service: Gynecology;  Laterality: N/A;   EYE SURGERY Right    "growth on eye removed"   FOOT SURGERY Right 08/2017    benign mass   HEMORRHOID SURGERY N/A 06/24/2018   Procedure: HEMORRHOIDECTOMY;  Surgeon: Michael Boston, MD;  Location: [place];  Service: General;  Laterality: N/A;   KNEE ARTHROSCOPY Right    Menicus repair   ORIF CLAVICULAR FRACTURE Right 04/28/2015   Procedure: REVISION ORIF RIGHT CLAVICAL FRACTURE, ALLOGRAFT BONE GRAFTING;  Surgeon: Justice Britain, MD;  Location: [place];  Service: Orthopedics;  Laterality: Right;   Peridontal Laser Surgery  01/2019   due to infection, x 2   PLACEMENT OF BREAST IMPLANTS Bilateral    ROBOTIC ASSISTED TOTAL HYSTERECTOMY WITH BILATERAL SALPINGO OOPHERECTOMY Bilateral 07/10/2018   Procedure: XI ROBOTIC ASSISTED TOTAL HYSTERECTOMY WITH BILATERAL SALPINGO OOPHORECTOMY;  Surgeon: Everitt Amber, MD;  Location: [facility];  Service: Gynecology;  Laterality: Bilateral;   SENTINEL NODE BIOPSY N/A 07/10/2018   Procedure: SENTINEL NODE BIOPSY, LEFT PELVIC NODE;  Surgeon: Everitt Amber, MD;  Location: [facility];  Service: Gynecology;  Laterality: N/A;   SPHINCTEROTOMY N/A 06/24/2018   Procedure: ANAL SPHINCTEROTOMY, ANORECTAL EXAM UNDER ANESTHESIA;  Surgeon: Michael Boston, MD;  Location: [place];  Service: General;  Laterality: N/A;   TONSILLECTOMY     Patient Active  Problem List   Diagnosis Date Noted   Brain aneurysm 07/28/2021   Preop examination 07/07/2021   Eczema 07/07/2021   Myalgia 02/14/2021   Left leg pain 12/28/2020   Bilateral hearing loss 05/07/2020   Depression, major, single episode, moderate ([place]) 05/07/2020   Acute right-sided low back pain without sciatica 09/14/2019   Foul smelling urine 09/14/2019   Family history of colon cancer 02/23/2019   Family history of prostate cancer    Family history of brain cancer    Rash 01/13/2019   Hemorrhoids 08/01/2018   Anal fissure s/p internal sphincterotomy 06/24/2018 06/24/2018   Prolapsed internal hemorrhoids, grade 2&3, s/p ligation/pexy/hemorrhoidectomy 06/24/2018 06/24/2018    Endometrial cancer ([place]) 06/05/2018   Rectal tear 03/24/2018   Preventative health care 08/09/2017   Depression with anxiety 08/09/2017   Hyperlipidemia LDL goal <100 08/09/2017   Seasonal allergies 08/09/2017   Osteoarthritis of knee 05/29/2017   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/03/2017   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   Hx of anxiety disorder 04/03/2017   Smoking greater than 30 pack years 04/03/2017   Emphysema of lung ([place]) 04/03/2017   Arteriosclerotic cardiovascular disease (ASCVD) 04/03/2017   Alcoholism in remission ([place]) 04/02/2017   History of transient ischemic attack (TIA) 09/27/2016   Transient cerebral ischemia 09/27/2016   Gastroesophageal reflux disease 09/27/2016   Generalized anxiety disorder 09/27/2016   Gait disturbance 09/23/2016   Leukocytosis 09/23/2016   Primary hypertension 09/23/2016   HLD (hyperlipidemia) 09/23/2016   Former smoker 09/23/2016   Paresthesias 09/22/2016   SINUSITIS- ACUTE-NOS 05/12/2008    PCP: [name], DO  REFERRING PROVIDER: Gaynelle Arabian, MD  REFERRING DIAG:  Diagnosis  [phone] (ICD-10-CM) - Presence of right artificial knee joint    THERAPY DIAG:  Muscle weakness (generalized)  Difficulty in walking, not elsewhere classified  Acute pain of right knee  Chronic pain of right knee  Rationale for Evaluation and Treatment: Rehabilitation  ONSET DATE: 04/11/22  SUBJECTIVE:   SUBJECTIVE STATEMENT: Still fearful down steps step over step. Exhausted with RTW PERTINENT HISTORY: S/P R TKR Brain aneurysm-clipped, OA R knee, scoliosis, leg length discrepancy, R shorter than L  PAIN:  Are you having pain? Pain in the legs with standing up to 5/10 PRECAUTIONS: None  WEIGHT BEARING RESTRICTIONS: No  FALLS:  Has patient fallen in last 6 months? 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   PLOF: Independent  PATIENT GOALS: Recover fully, walk.  NEXT MD VISIT: 3 weeks  OBJECTIVE:   DIAGNOSTIC FINDINGS: N/A   COGNITION: Overall cognitive status: Within functional limits for tasks assessed     SENSATION: Not tested  EDEMA:  Expected swelling   POSTURE: weight shift left  PALPATION: R thigh was severely indurated above the ace wrap, painful to touch.  LOWER EXTREMITY ROM: WNL except where noted.  Passive ROM Right eval Left eval Right 05/07/22 Right 05/15/22 Right  AROM 05/22/22  Hip flexion       Hip extension       Hip abduction       Hip adduction       Hip internal rotation       Hip external rotation       Knee flexion 85  97 98 100  Knee extension 27  -  12 -10 10  Ankle dorsiflexion       Ankle plantarflexion       Ankle inversion       Ankle eversion         LOWER EXTREMITY MMT: LLE 5  MMT Right eval Right 05/15/22  Hip flexion 3- 4  Hip extension    Hip abduction 3- 4  Hip adduction    Hip internal rotation    Hip external rotation    Knee flexion 2+ 4  Knee extension 3- 4  Ankle dorsiflexion 4   Ankle plantarflexion    Ankle inversion    Ankle eversion      GAIT: Distance walked: In clinic distances Assistive device utilized: Walker - 2 wheeled Level of assistance: Modified independence Comments: Patient reports difficulty managing her 2 steps. Educated to correct technique, she has a handle to hold onto.Walked in with max reliance on BUE support, step to gait pattern with RLE slightly ER throughout gait cycle.   TODAY'S TREATMENT:                                                                                                                              DATE:  06/06/22 NuStep L4 x 6 minute STM to R quads, ITB, calves, HS tendons AAROM for R knee ext AAROM for R knee flexion 112  degrees. Upside down BOSU B standing balance, progressed to weight shifts ant/post, then lat, then mini squats with minimal use of UE for balance. Treadmill pulls 2 x 10, pushes x 10 Seated knee flex 20#, RLE only, 2 x 10 reps Seated knee ext R, 5#, 2 x 10  05/31/22 Nustep L 5 6 min LE only 40# cable press down 2 sets 10 30# running man fwd and laterally 20 x each 6 inch  RT heel tap down 2 sets 10 UE support BOSU step up 10 x fwd, 10 x laterally RT Leg press seat 7 SL press 20# 10 x, then 10 x 2 sets unlocked for ROM Leg press 30# 2 sets 10 calf raises HS curl RT LE only 2 sets 10 20# LAQ RT LE only 5# 2 sets 10 Passive stretching into flexion  05/29/22 Bike L1 x 6 min R knee STM to quads, HS, patellar AAROM to R knee flex and ext R knee PROM 3-110 Seated knee flex, B 25#, x 10, 2 x 10 R, 15# Seated knee ext BLE x 10, 10#, then eccentric on R x 10, 10# Walking on treadmill, no power, 30 seconds Treadmill push with RLE x 15. SLS on air ex pad x up to 7 seconds SLS on the floor x up to 10 sec.  05/24/22 PROM with belt mobs to increase flexion Elliptical 4 min forward Bike L 4 5 min 4 inch step down 10 x, then up over and revers STS HHA on 8 in box on TM 3 sets 5 BOSU black up wt shift and mini squats  BOSU black down step up fwd and laterally 10 x each Leg press 20# x10, then no weight working on flexion 2x10  05/22/22 Nustep level 5 x 5 minutes Gentle STM to the scar and the knee area, gentle joint distraction, PROM of the right knee flexion and extension SAQ with cues for TKE Bike level 2 full revolutions with some trunk lean Tmill push 20seconds fwd and backward Leg press 20# x10, then no weight working on flexion 2x10 Stairs step over step, gait wihtout device Passive motion to 105 degrees flexion  05/17/22 Bike L1 x 6 minutes Initially had to weight shift strongly to the L to get around Supine PROM for knee ext with heel elevated 5 x 5 sec with overpressure Knee ext  pushing into ball, hold x 5 sec , 10 reps Seated knee flexion ROM, contract relax  R knee PROM 6-109 Seated R knee flexion 15#, 2 x 10 reps Seated R knee ext 5#, 2 x 10 reps Standing on BOSU ball, static for balance, no UE support, then mini squats with minimal UE support, x 10 reps Gait training, emphasize increased speed for more normalized gait mechanics-Still demo decreased R stance time, increased lateral trunk sway, sometimes decreased R knee flex in swing.   05/15/22 Progress note- recheck strength, ROM, and goals Stair training in other building NuStep L5 x5 mins PROM- x5 flex/ext Contract relax x5  Leg ext 5# RLE 2x10 HS curls 10# 2x10 Leg press 20# 2x10, progressive lowering seat to 5  Anterior step downs 4" x10 Calf stretch on slant 30s  PATIENT EDUCATION:  Education details: HEP, POC Person educated: Patient and Spouse Education method: Explanation, Demonstration, and Handouts Education comprehension: verbalized understanding and returned demonstration  HOME EXERCISE PROGRAM: [id number]  ASSESSMENT:  CLINICAL IMPRESSION:Patient progressing in all areas. Challenged proprioception on BOSU with improved control.  OBJECTIVE IMPAIRMENTS: Abnormal gait, decreased activity tolerance, decreased balance, decreased coordination, decreased mobility, difficulty walking, decreased ROM, decreased strength, increased muscle spasms, impaired flexibility, postural dysfunction, and pain.   ACTIVITY LIMITATIONS: carrying, lifting, bending, sitting, standing, squatting, sleeping, stairs, transfers, bathing, toileting, and locomotion level  PARTICIPATION LIMITATIONS: meal prep, cleaning, laundry, driving, shopping, community activity, and occupation  PERSONAL FACTORS: Age and Past/current experiences are also affecting patient's functional outcome.   REHAB POTENTIAL: Good  CLINICAL DECISION MAKING: Evolving/moderate complexity  EVALUATION COMPLEXITY: Moderate   GOALS: Goals  reviewed with patient? Yes  SHORT TERM GOALS: Target date: 05/03/22 I with initial HEP Baseline: Goal status: Met  LONG TERM GOALS: Target date: 07/06/22  I with final HEP Baseline:  Goal status: ongoping  2.  Increase r knee ROM to <5-120 Baseline:  Goal status: 05/29/22 ongoing, 06/06/22- 5-112 ongoing  3.  Increase R knee strength to at least 4+/5 Baseline:  Goal status: 06/06/22- 4-/5 ongoing  4.  Patient will be able to walk on level and unlevel surfaces, x at least 500', I, no pain Baseline:  Goal status: 05/29/22 ongoing  5.  Up and down at least 15 steps with UUE support, step over step each direction. Baseline:  Goal status: IN PROGRESS PLAN:  PT FREQUENCY: 2x/week  PT DURATION: 12 weeks  PLANNED INTERVENTIONS: Therapeutic exercises, Therapeutic activity, Neuromuscular re-education, Balance training, Gait training, Patient/Family education, Self Care, Joint mobilization, Stair training, Dry Needling, Electrical stimulation, Cryotherapy, Moist heat, Vasopneumatic device, Ionotophoresis 18m/ml Dexamethasone, and Manual therapy  PLAN FOR NEXT SESSION: continue to progress activity, ROM and function  Patient Details  Name: Rebecca KronenwetterMRN:  [id number] Date of Birth: Jul 27, 1955 Referring Provider:  [name], *  Encounter Date: 06/06/2022  Ethel Rana DPT 06/06/22 11:09 AM   [facility] [place] [place], [place], [address] Phone: [phone]   Fax:  [phone]

## 2022-06-08 ENCOUNTER — Ambulatory Visit: Payer: BC Managed Care – PPO | Admitting: Physical Therapy

## 2022-06-08 DIAGNOSIS — M6281 Muscle weakness (generalized): Secondary | ICD-10-CM | POA: Diagnosis not present

## 2022-06-08 DIAGNOSIS — R262 Difficulty in walking, not elsewhere classified: Secondary | ICD-10-CM

## 2022-06-08 NOTE — Therapy (Signed)
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   Anxiety    Colon polyps    Depression    Endometrial cancer (HCC)    Grade I   Endometrial polyp    Family history of brain cancer    Family history of prostate cancer    Former smoker 09/23/2016   GERD (gastroesophageal reflux disease)    Nexium   H/O clavicle fracture 2017   Right   High cholesterol    History of bronchitis    History of pneumonia    years ago   Hypertension    LVH (left ventricular hypertrophy) 09/23/2016   Mild, noted on ECHO   Numbness    left fingers after TIA   OA (osteoarthritis)    "right knee"   Osteoporosis 08/2017   T score -2.7   Panic attacks    Postmenopausal bleeding    Thyroid nodule 2019   Multiple   TIA (transient ischemic attack) 09/2016   Wears contact lenses    Past Surgical History:  Procedure Laterality Date   CLAVICLE SURGERY Right 2016   fracture repair   COLONOSCOPY  2017   multiples   CRANIOTOMY FOR ANEURYSM / VERTEBROBASILAR / CAROTID CIRCULATION  09/05/2021   DILATATION & CURETTAGE/HYSTEROSCOPY WITH MYOSURE N/A 05/26/2018   Procedure: DILATATION & CURETTAGE/HYSTEROSCOPY WITH MYOSURE;  Surgeon: Princess Bruins, MD;  Location: [facility];  Service: Gynecology;  Laterality: N/A;   EYE SURGERY Right    "growth on eye removed"   FOOT SURGERY Right 08/2017   benign mass   HEMORRHOID SURGERY N/A 06/24/2018   Procedure: HEMORRHOIDECTOMY;  Surgeon: Michael Boston, MD;  Location: [place];  Service: General;  Laterality: N/A;   KNEE ARTHROSCOPY Right    Menicus repair   ORIF CLAVICULAR FRACTURE Right 04/28/2015   Procedure: REVISION ORIF RIGHT CLAVICAL FRACTURE, ALLOGRAFT BONE GRAFTING;  Surgeon: Justice Britain, MD;  Location: [place];  Service: Orthopedics;  Laterality: Right;   Peridontal Laser Surgery  01/2019   due to infection, x 2   PLACEMENT OF BREAST IMPLANTS Bilateral    ROBOTIC ASSISTED TOTAL HYSTERECTOMY WITH BILATERAL SALPINGO OOPHERECTOMY Bilateral 07/10/2018   Procedure: XI ROBOTIC ASSISTED TOTAL HYSTERECTOMY WITH BILATERAL SALPINGO OOPHORECTOMY;  Surgeon: Everitt Amber, MD;  Location: [facility];  Service: Gynecology;  Laterality: Bilateral;   SENTINEL NODE BIOPSY N/A 07/10/2018   Procedure: SENTINEL NODE BIOPSY, LEFT PELVIC NODE;  Surgeon: Everitt Amber, MD;  Location: [facility];  Service: Gynecology;  Laterality: N/A;   SPHINCTEROTOMY N/A 06/24/2018   Procedure: ANAL SPHINCTEROTOMY, ANORECTAL EXAM UNDER ANESTHESIA;  Surgeon: Michael Boston, MD;  Location: [facility];  Service: General;  Laterality: N/A;   TONSILLECTOMY     Patient Active Problem List   Diagnosis Date Noted   Brain aneurysm 07/28/2021   Preop examination 07/07/2021   Eczema 07/07/2021  Myalgia 02/14/2021   Left leg pain 12/28/2020   Bilateral hearing loss 05/07/2020   Depression, major, single episode, moderate ([place]) 05/07/2020   Acute right-sided low back pain without sciatica 09/14/2019   Foul smelling urine 09/14/2019   Family history of colon cancer 02/23/2019   Family history of prostate cancer    Family history of brain cancer    Rash 01/13/2019   Hemorrhoids 08/01/2018   Anal fissure s/p internal sphincterotomy 06/24/2018 06/24/2018   Prolapsed internal hemorrhoids, grade 2&3, s/p ligation/pexy/hemorrhoidectomy 06/24/2018 06/24/2018   Endometrial cancer ([place]) 06/05/2018   Rectal tear 03/24/2018   Preventative health care 08/09/2017   Depression with anxiety 08/09/2017    Hyperlipidemia LDL goal <100 08/09/2017   Seasonal allergies 08/09/2017   Osteoarthritis of knee 05/29/2017   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/2017   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   Hx of anxiety disorder 04/03/2017   Smoking greater than 30 pack years 04/03/2017   Emphysema of lung ([place]) 04/03/2017   Arteriosclerotic cardiovascular disease (ASCVD) 04/03/2017   Alcoholism in remission ([place]) 04/02/2017   History of transient ischemic attack (TIA) 09/27/2016   Transient cerebral ischemia 09/27/2016   Gastroesophageal reflux disease 09/27/2016   Generalized anxiety disorder 09/27/2016   Gait disturbance 09/23/2016   Leukocytosis 09/23/2016   Primary hypertension 09/23/2016   HLD (hyperlipidemia) 09/23/2016   Former smoker 09/23/2016   Paresthesias 09/22/2016   SINUSITIS- ACUTE-NOS 05/12/2008    PCP: [name], DO  REFERRING PROVIDER: Gaynelle Arabian, MD  REFERRING DIAG:  Diagnosis  [phone] (ICD-10-CM) - Presence of right artificial knee joint    THERAPY DIAG:  Muscle weakness (generalized)  Difficulty in walking, not elsewhere classified  Rationale for Evaluation and Treatment: Rehabilitation  ONSET DATE: 04/11/22  SUBJECTIVE:   SUBJECTIVE STATEMENT: "Hitch in my getty up" left leg popping now PERTINENT HISTORY: S/P R TKR Brain aneurysm-clipped, OA R knee, scoliosis, leg length discrepancy, R shorter than L  PAIN:  Are you having pain? Pain in the legs with standing up to 5/10 PRECAUTIONS: None  WEIGHT BEARING RESTRICTIONS: No  FALLS:  Has patient fallen in last 6 months? 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   PLOF: Independent  PATIENT GOALS: Recover fully, walk.  NEXT MD VISIT: 3  weeks  OBJECTIVE:   DIAGNOSTIC FINDINGS: N/A   COGNITION: Overall cognitive status: Within functional limits for tasks assessed     SENSATION: Not tested  EDEMA:  Expected swelling   POSTURE: weight shift left  PALPATION: R thigh was severely indurated above the ace wrap, painful to touch.  LOWER EXTREMITY ROM: WNL except where noted.  Passive ROM Right eval Left eval Right 05/07/22 Right 05/15/22 Right  AROM 05/22/22  Hip flexion       Hip extension       Hip abduction       Hip adduction       Hip internal rotation       Hip external rotation       Knee flexion 85  97 98 100  Knee extension 27  -12 -10 10  Ankle dorsiflexion       Ankle plantarflexion       Ankle inversion       Ankle eversion  LOWER EXTREMITY MMT: LLE 5  MMT Right eval Right 05/15/22  Hip flexion 3- 4  Hip extension    Hip abduction 3- 4  Hip adduction    Hip internal rotation    Hip external rotation    Knee flexion 2+ 4  Knee extension 3- 4  Ankle dorsiflexion 4   Ankle plantarflexion    Ankle inversion    Ankle eversion      GAIT: Distance walked: In clinic distances Assistive device utilized: Walker - 2 wheeled Level of assistance: Modified independence Comments: Patient reports difficulty managing her 2 steps. Educated to correct technique, she has a handle to hold onto.Walked in with max reliance on BUE support, step to gait pattern with RLE slightly ER throughout gait cycle.   TODAY'S TREATMENT:                                                                                                                              DATE:   06/08/22 Elliptical 2 min fwd/2 min back L 2 Bike 5 min BOSU step up (black side down) 10 x fwd and laterally RT BOSU ( black side up) squats and ball toss with occasional HHA needed PROM RT knee flex and ext. Stretching to RT HS and ITB with STW Leg Press 40# BIL 2 sets 10, calf raises 2 sets 10, RT SL press no wt for ROM Seated knee  flex 20#, RLE only, 2 x 10 reps Seated knee ext R, 5#, 2 x 10       06/06/22 NuStep L4 x 6 minute STM to R quads, ITB, calves, HS tendons AAROM for R knee ext AAROM for R knee flexion 112 degrees. Upside down BOSU B standing balance, progressed to weight shifts ant/post, then lat, then mini squats with minimal use of UE for balance. Treadmill pulls 2 x 10, pushes x 10 Seated knee flex 20#, RLE only, 2 x 10 reps Seated knee ext R, 5#, 2 x 10  05/31/22 Nustep L 5 6 min LE only 40# cable press down 2 sets 10 30# running man fwd and laterally 20 x each 6 inch  RT heel tap down 2 sets 10 UE support BOSU step up 10 x fwd, 10 x laterally RT Leg press seat 7 SL press 20# 10 x, then 10 x 2 sets unlocked for ROM Leg press 30# 2 sets 10 calf raises HS curl RT LE only 2 sets 10 20# LAQ RT LE only 5# 2 sets 10 Passive stretching into flexion  05/29/22 Bike L1 x 6 min R knee STM to quads, HS, patellar AAROM to R knee flex and ext R knee PROM 3-110 Seated knee flex, B 25#, x 10, 2 x 10 R, 15# Seated knee ext BLE x 10, 10#, then eccentric on R x 10, 10# Walking on treadmill, no power, 30 seconds Treadmill push with RLE x 15. SLS on air ex pad x up to 7 seconds  SLS on the floor x up to 10 sec.  05/24/22 PROM with belt mobs to increase flexion Elliptical 4 min forward Bike L 4 5 min 4 inch step down 10 x, then up over and revers STS HHA on 8 in box on TM 3 sets 5 BOSU black up wt shift and mini squats BOSU black down step up fwd and laterally 10 x each Leg press 20# x10, then no weight working on flexion 2x10  05/22/22 Nustep level 5 x 5 minutes Gentle STM to the scar and the knee area, gentle joint distraction, PROM of the right knee flexion and extension SAQ with cues for TKE Bike level 2 full revolutions with some trunk lean Tmill push 20seconds fwd and backward Leg press 20# x10, then no weight working on flexion 2x10 Stairs step over step, gait wihtout device Passive motion to  105 degrees flexion  05/17/22 Bike L1 x 6 minutes Initially had to weight shift strongly to the L to get around Supine PROM for knee ext with heel elevated 5 x 5 sec with overpressure Knee ext pushing into ball, hold x 5 sec , 10 reps Seated knee flexion ROM, contract relax  R knee PROM 6-109 Seated R knee flexion 15#, 2 x 10 reps Seated R knee ext 5#, 2 x 10 reps Standing on BOSU ball, static for balance, no UE support, then mini squats with minimal UE support, x 10 reps Gait training, emphasize increased speed for more normalized gait mechanics-Still demo decreased R stance time, increased lateral trunk sway, sometimes decreased R knee flex in swing.   05/15/22 Progress note- recheck strength, ROM, and goals Stair training in other building NuStep L5 x5 mins PROM- x5 flex/ext Contract relax x5  Leg ext 5# RLE 2x10 HS curls 10# 2x10 Leg press 20# 2x10, progressive lowering seat to 5  Anterior step downs 4" x10 Calf stretch on slant 30s  PATIENT EDUCATION:  Education details: HEP, POC Person educated: Patient and Spouse Education method: Explanation, Demonstration, and Handouts Education comprehension: verbalized understanding and returned demonstration  HOME EXERCISE PROGRAM: [id number]  ASSESSMENT:  CLINICAL IMPRESSION:Patient progressing in all areas. Challenged proprioception on BOSU with improved control.  OBJECTIVE IMPAIRMENTS: Abnormal gait, decreased activity tolerance, decreased balance, decreased coordination, decreased mobility, difficulty walking, decreased ROM, decreased strength, increased muscle spasms, impaired flexibility, postural dysfunction, and pain.   ACTIVITY LIMITATIONS: carrying, lifting, bending, sitting, standing, squatting, sleeping, stairs, transfers, bathing, toileting, and locomotion level  PARTICIPATION LIMITATIONS: meal prep, cleaning, laundry, driving, shopping, community activity, and occupation  PERSONAL FACTORS: Age and Past/current  experiences are also affecting patient's functional outcome.   REHAB POTENTIAL: Good  CLINICAL DECISION MAKING: Evolving/moderate complexity  EVALUATION COMPLEXITY: Moderate   GOALS: Goals reviewed with patient? Yes  SHORT TERM GOALS: Target date: 05/03/22 I with initial HEP Baseline: Goal status: Met  LONG TERM GOALS: Target date: 07/06/22  I with final HEP Baseline:  Goal status: ongoping  2.  Increase r knee ROM to <5-120 Baseline:  Goal status: 05/29/22 ongoing, 06/06/22- 5-112 ongoing  3.  Increase R knee strength to at least 4+/5 Baseline:  Goal status: 06/06/22- 4-/5 ongoing  4.  Patient will be able to walk on level and unlevel surfaces, x at least 500', I, no pain Baseline:  Goal status: 05/29/22 ongoing  5.  Up and down at least 15 steps with UUE support, step over step each direction. Baseline:  Goal status: IN PROGRESS PLAN:  PT FREQUENCY: 2x/week  PT DURATION: 12 weeks  PLANNED INTERVENTIONS: Therapeutic exercises, Therapeutic activity, Neuromuscular re-education, Balance training, Gait training, Patient/Family education, Self Care, Joint mobilization, Stair training, Dry Needling, Electrical stimulation, Cryotherapy, Moist heat, Vasopneumatic device, Ionotophoresis '4mg'$ /ml Dexamethasone, and Manual therapy  PLAN FOR NEXT SESSION: continue to progress activity, ROM and function. MD 3/5  Patient Details  Name: Rebecca Gordon MRN: [id number] Date of Birth: June 08, 1955 Referring Provider:  [name], *  Encounter Date: 06/08/2022  Ethel Rana DPT 06/08/22 7:57 AM   [facility] [place]. [place], [place], [address] Phone: [phone]   Fax:  [facility] [place]. [place], [place], [address] Phone: [phone]   Fax:  [phone]  Patient Details  Name: Rebecca Gordon MRN: [id number] Date of Birth:  Jul 14, 1955 Referring Provider:  [name], *  Encounter Date: 06/08/2022   Laqueta Carina, PTA 06/08/2022, 7:57 AM  [facility] at [place]. [place], [place], [address] Phone: [phone]   Fax:  [phone]

## 2022-06-11 ENCOUNTER — Ambulatory Visit (INDEPENDENT_AMBULATORY_CARE_PROVIDER_SITE_OTHER): Payer: BC Managed Care – PPO | Admitting: *Deleted

## 2022-06-11 DIAGNOSIS — M810 Age-related osteoporosis without current pathological fracture: Secondary | ICD-10-CM | POA: Diagnosis not present

## 2022-06-11 MED ORDER — DENOSUMAB 60 MG/ML ~~LOC~~ SOSY
60.0000 mg | PREFILLED_SYRINGE | Freq: Once | SUBCUTANEOUS | Status: AC
  Administered 2022-06-11: 60 mg via SUBCUTANEOUS

## 2022-06-12 ENCOUNTER — Ambulatory Visit: Payer: BC Managed Care – PPO | Admitting: Physical Therapy

## 2022-06-13 ENCOUNTER — Telehealth: Payer: Self-pay | Admitting: *Deleted

## 2022-06-13 NOTE — Telephone Encounter (Signed)
Patient called and scheduled a follow up appt on 3/6 with Dr Delsa Sale

## 2022-06-14 ENCOUNTER — Ambulatory Visit: Payer: BC Managed Care – PPO | Admitting: Physical Therapy

## 2022-06-19 ENCOUNTER — Ambulatory Visit: Payer: BC Managed Care – PPO | Admitting: Physical Therapy

## 2022-06-20 ENCOUNTER — Ambulatory Visit: Payer: BC Managed Care – PPO | Admitting: Obstetrics & Gynecology

## 2022-06-20 ENCOUNTER — Ambulatory Visit: Payer: BC Managed Care – PPO | Admitting: Physical Therapy

## 2022-06-21 ENCOUNTER — Ambulatory Visit: Payer: BC Managed Care – PPO | Admitting: Physical Therapy

## 2022-06-22 ENCOUNTER — Ambulatory Visit: Payer: BC Managed Care – PPO | Admitting: Physical Therapy

## 2022-06-24 ENCOUNTER — Other Ambulatory Visit: Payer: Self-pay | Admitting: Family Medicine

## 2022-06-24 DIAGNOSIS — I10 Essential (primary) hypertension: Secondary | ICD-10-CM

## 2022-06-26 ENCOUNTER — Ambulatory Visit: Payer: BC Managed Care – PPO | Admitting: Physical Therapy

## 2022-06-27 ENCOUNTER — Ambulatory Visit: Payer: BC Managed Care – PPO | Admitting: Physical Therapy

## 2022-06-28 ENCOUNTER — Ambulatory Visit: Payer: BC Managed Care – PPO | Admitting: Physical Therapy

## 2022-06-29 ENCOUNTER — Ambulatory Visit: Payer: BC Managed Care – PPO | Admitting: Physical Therapy

## 2022-07-03 ENCOUNTER — Ambulatory Visit: Payer: BC Managed Care – PPO | Admitting: Physical Therapy

## 2022-07-04 ENCOUNTER — Ambulatory Visit: Payer: BC Managed Care – PPO | Admitting: Physical Therapy

## 2022-07-05 ENCOUNTER — Ambulatory Visit: Payer: BC Managed Care – PPO | Admitting: Physical Therapy

## 2022-07-06 ENCOUNTER — Ambulatory Visit: Payer: BC Managed Care – PPO | Admitting: Physical Therapy

## 2022-07-17 ENCOUNTER — Encounter: Payer: Self-pay | Admitting: Obstetrics & Gynecology

## 2022-07-18 ENCOUNTER — Inpatient Hospital Stay: Payer: BC Managed Care – PPO | Attending: Obstetrics & Gynecology | Admitting: Obstetrics & Gynecology

## 2022-07-18 ENCOUNTER — Encounter: Payer: Self-pay | Admitting: Obstetrics & Gynecology

## 2022-07-18 VITALS — BP 124/63 | HR 96 | Temp 98.5°F | Resp 16 | Ht 63.0 in | Wt 168.0 lb

## 2022-07-18 DIAGNOSIS — Z8542 Personal history of malignant neoplasm of other parts of uterus: Secondary | ICD-10-CM | POA: Diagnosis present

## 2022-07-18 DIAGNOSIS — C541 Malignant neoplasm of endometrium: Secondary | ICD-10-CM

## 2022-07-18 NOTE — Patient Instructions (Signed)
Return in 1 year ?

## 2022-07-18 NOTE — Progress Notes (Signed)
Follow Up Note: Gyn-Onc  Alejandro Mulling 67 y.o. female  CC: She presents for a f/u visit   HPI: The oncology history was reviewed.  Interval History:   She denies any vaginal bleeding, abdominal/pelvic pain, cough, lethargy or increasing abdominal girth. A palpable abnormality was noted at the last visit.  A follow-up CTAP was negative for recurrent/metastatic disease.  In the interim she has been seeing Dr. Dellis Filbert for exams.  Of note she is also status post neurosurgery for brain aneurysm.  Review of Systems  Review of Systems  Constitutional:  Negative for malaise/fatigue and weight loss.  Respiratory:  Negative for shortness of breath and wheezing.   Cardiovascular:  Negative for chest pain and leg swelling.  Gastrointestinal:  Negative for abdominal pain, blood in stool, constipation, nausea and vomiting.  Genitourinary:  Negative for dysuria, frequency, hematuria and urgency.  Musculoskeletal:  Negative for joint pain and myalgias.  Neurological:  Negative for weakness.  Psychiatric/Behavioral:  Negative for depression. The patient does not have insomnia.    Current medications, allergy, social history, past surgical history, past medical history, family history were all reviewed.    Vitals:  BP 124/63 (BP Location: Left Arm, Patient Position: Sitting)   Pulse 96   Temp 98.5 F (36.9 C) (Oral)   Resp 16   Ht 5\' 3"  (1.6 m)   Wt 168 lb (76.2 kg)   SpO2 99%   BMI 29.76 kg/m     Physical Exam Exam conducted with a chaperone present.  Constitutional:      General: She is not in acute distress. Cardiovascular:     Rate and Rhythm: Normal rate and regular rhythm.  Pulmonary:     Effort: Pulmonary effort is normal.     Breath sounds: Normal breath sounds. No wheezing or rhonchi.  Abdominal:     Palpations: Abdomen is soft.     Tenderness: There is no abdominal tenderness. There is no right CVA tenderness or left CVA tenderness.     Hernia: No hernia is present.   Genitourinary:    General: Normal vulva.     Urethra: No urethral lesion.     Vagina: No lesions. No bleeding  Adnexa: right sided fullness, NT Musculoskeletal:     Cervical back: Neck supple.     Right lower leg: No edema.     Left lower leg: No edema.  Lymphadenopathy:     Upper Body:     Right upper body: No supraclavicular adenopathy.     Left upper body: No supraclavicular adenopathy.     Lower Body: No right inguinal adenopathy. No left inguinal adenopathy.  Skin:    Findings: No rash.  Neurological:     Mental Status: She is oriented to person, place, and time.   Assessment/Plan: Endometrial cancer ([place]) Ms. Janila Sylla Dipirro  is a 67 y.o.  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  Negative symptom review, normal exam We discussed that current guidelines have eliminated the use of Pap tests of the vaginal cuff to detect recurrence(s)   >f/u prn or in 1 yr; she will see Dr. Dellis Filbert in 6 months    I personally spent 25 minutes face-to-face and non-face-to-face in the care of this patient, which includes all pre, intra, and post visit time on the date of service.    Lahoma Crocker, MD

## 2022-07-18 NOTE — Assessment & Plan Note (Addendum)
Rebecca Gordon  is a 67 y.o.  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  Negative symptom review, normal exam We discussed that current guidelines have eliminated the use of Pap tests of the vaginal cuff to detect recurrence(s)   >f/u prn or in 1 yr; she will see Dr. Dellis Filbert in 6 months

## 2022-07-25 ENCOUNTER — Other Ambulatory Visit: Payer: Self-pay | Admitting: Family Medicine

## 2022-07-25 DIAGNOSIS — F418 Other specified anxiety disorders: Secondary | ICD-10-CM

## 2022-11-19 ENCOUNTER — Telehealth: Payer: Self-pay | Admitting: *Deleted

## 2022-11-19 NOTE — Telephone Encounter (Signed)
Call returned to patient, left detailed message. Advised next prolia due after 12/10/22. We are in the process of reviewing your benefits for Prolia, will f/u once this has been completed. Return call to office at [phone], option 5,  if any questions in the meantime.   Last AEX 10/27/21 -ML No future AEX scheduled.  Calcium 9.5 on 01/16/22 BUN 24 on 01/16/22  Submitted to [employer] for review of benefits for Prolia.

## 2022-11-23 NOTE — Telephone Encounter (Signed)
Patient left voicemail requesting to update insurance information.   Spoke with patient. New insurance information provided, message to front office to update. Resubmitted to [employer] for review of Prolia benefits. Will be transitioning to medicare 01/15/2023.   Last Prolia received 06/11/22 Calcium 9.5 on 01/16/22 AEX 10/27/21 -ML BMD 02/06/22  Patient alternates f/u with GYN ONC and GYN every 6 months, not due for OV with GYN until October 2024, last seen by GYN ONC 07/18/22.  Patient states she plans to receive her next Prolia at [facility] then transfer her care back to [facility], does not want delay in Prolia. Reports that she has started new medication, Wegovy, medication list updated. Patient asking if there are any contraindications with Prolia and Wegovy?   Advised patient I will proceed with reviewing Prolia benefits with new insurance information provided and review Prolia with Dr. Edward Jolly. I will f/u next week once reviewed. Patient agreeable.   Dr. Edward Jolly -please review.

## 2022-11-26 NOTE — Telephone Encounter (Signed)
No interaction between [place].

## 2022-11-27 MED ORDER — DENOSUMAB 60 MG/ML ~~LOC~~ SOSY
60.0000 mg | PREFILLED_SYRINGE | Freq: Once | SUBCUTANEOUS | Status: AC
  Administered 2022-12-14: 60 mg via SUBCUTANEOUS

## 2022-11-27 NOTE — Telephone Encounter (Signed)
Spoke with patient, advised per Dr. Edward Jolly.  No upcoming dental procedures.   Prolia estimate of benefits reviewed:  No PA required [facility] Max $3900 [phone] met) No deductible or coinsurance.  OOP cost $25 for Prolia and $25 for admin fee  Advised patient she Will need to schedule AEX with [facility] provider if she does not transfer care and continues Prolia at [facility].   Patient agreeable.   Nurse visit scheduled for 8/30 at 0815.  Routing to provider for final review. Patient is agreeable to disposition. Will close encounter.

## 2022-12-14 ENCOUNTER — Ambulatory Visit (INDEPENDENT_AMBULATORY_CARE_PROVIDER_SITE_OTHER): Payer: BC Managed Care – PPO

## 2022-12-14 DIAGNOSIS — M810 Age-related osteoporosis without current pathological fracture: Secondary | ICD-10-CM | POA: Diagnosis not present

## 2023-02-09 ENCOUNTER — Other Ambulatory Visit: Payer: Self-pay | Admitting: Family Medicine

## 2023-02-09 DIAGNOSIS — I10 Essential (primary) hypertension: Secondary | ICD-10-CM

## 2023-03-12 IMAGING — MR MR MRA HEAD W/O CM
1 series · 48 of 48 positions shown · non-contrast
Comparison: CT a of the head 09/23/2016

CLINICAL DATA: History of cerebral aneurysm

EXAM:
MRA HEAD WITHOUT CONTRAST
TECHNIQUE: Angiographic images of the Circle of Willis were acquired using MRA
technique without intravenous contrast.

[Series 2: tof_3d_multi-slab · axial · 0.7mm · 0.70mm/px · z∈[-59,+58]mm · 48 of 168 slices shown]
[im 1/168]
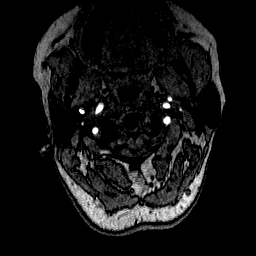
[im 4/168]
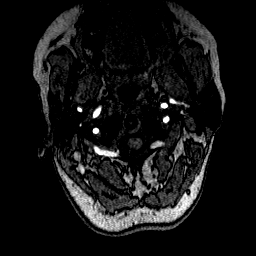
[im 8/168]
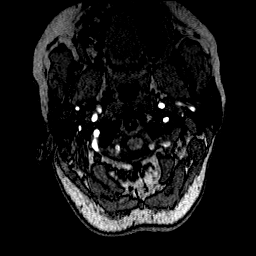
[im 11/168]
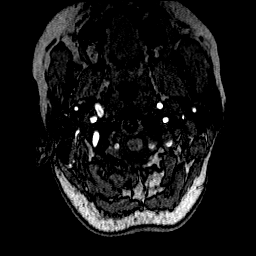
[im 15/168]
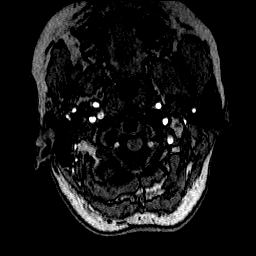
[im 18/168]
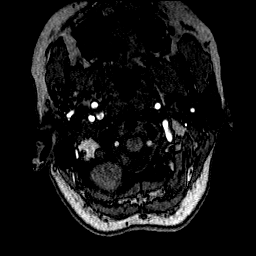
[im 22/168]
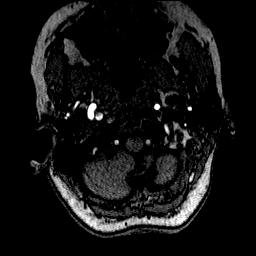
[im 25/168]
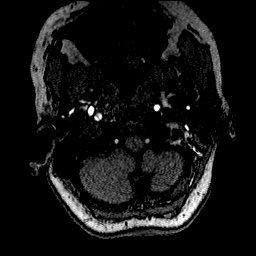
[im 29/168]
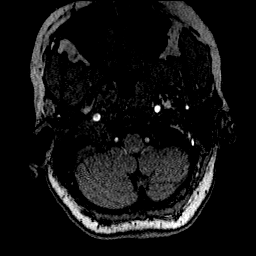
[im 32/168]
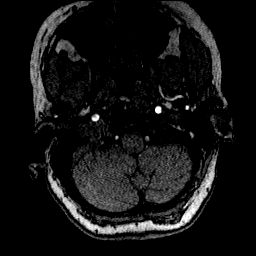
[im 36/168]
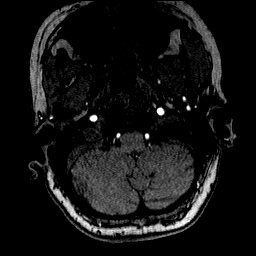
[im 40/168]
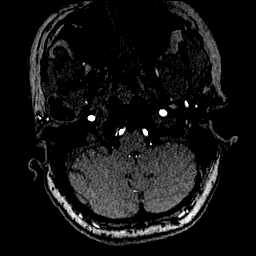
[im 43/168]
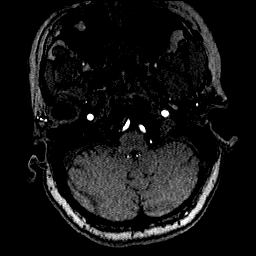
[im 47/168]
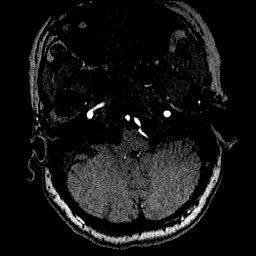
[im 50/168]
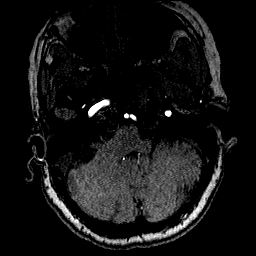
[im 54/168]
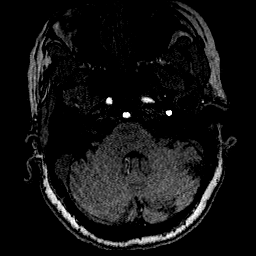
[im 57/168]
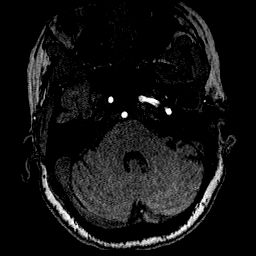
[im 61/168]
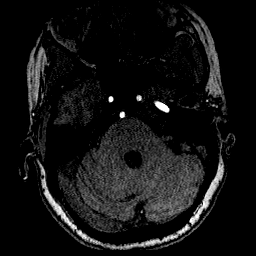
[im 64/168]
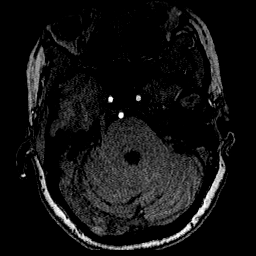
[im 68/168]
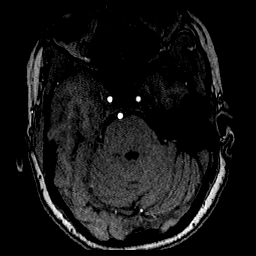
[im 72/168]
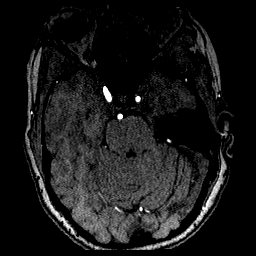
[im 75/168]
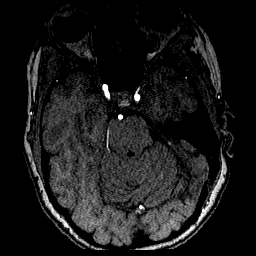
[im 79/168]
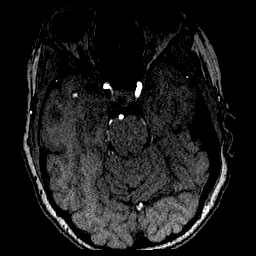
[im 82/168]
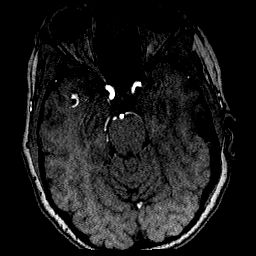
[im 86/168]
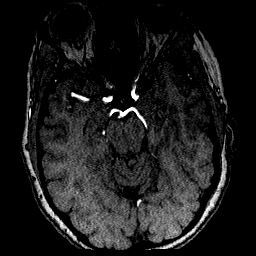
[im 89/168]
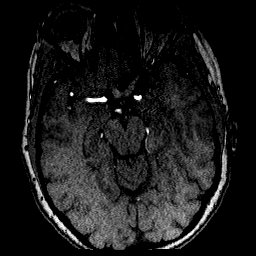
[im 93/168]
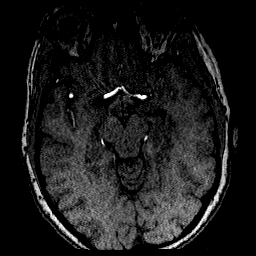
[im 96/168]
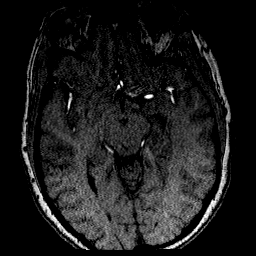
[im 100/168]
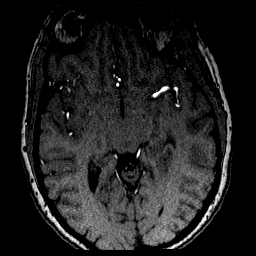
[im 104/168]
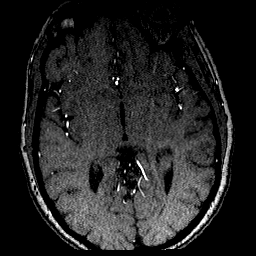
[im 107/168]
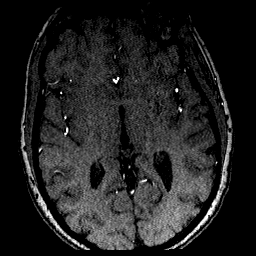
[im 111/168]
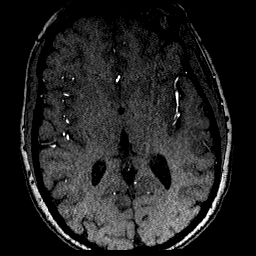
[im 114/168]
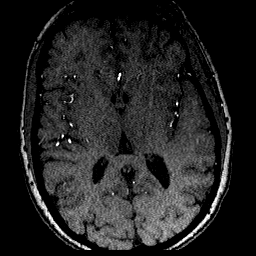
[im 118/168]
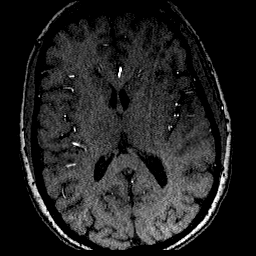
[im 121/168]
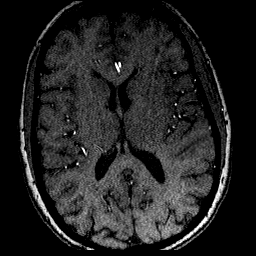
[im 125/168]
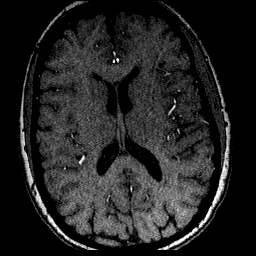
[im 128/168]
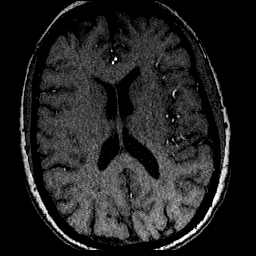
[im 132/168]
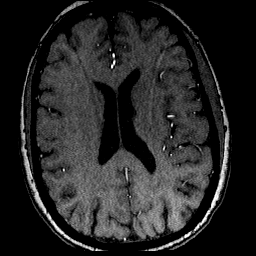
[im 136/168]
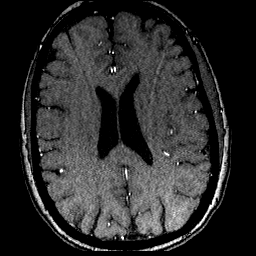
[im 139/168]
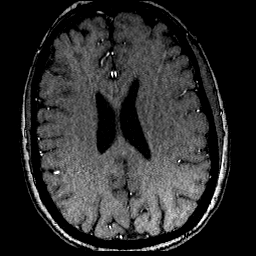
[im 143/168]
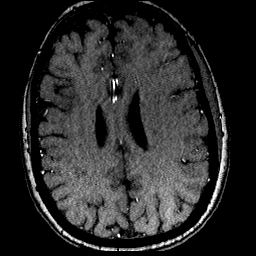
[im 146/168]
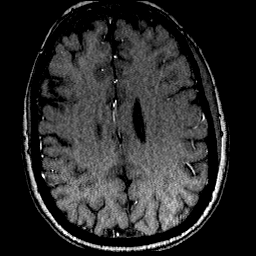
[im 150/168]
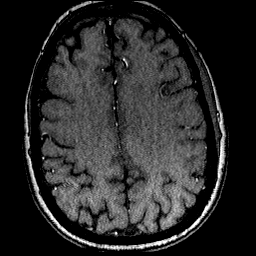
[im 153/168]
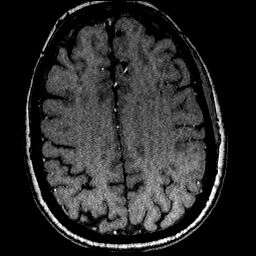
[im 157/168]
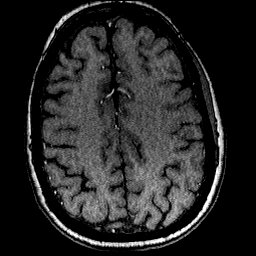
[im 160/168]
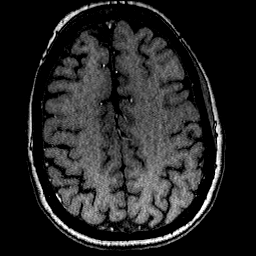
[im 164/168]
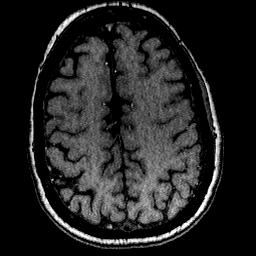
[im 168/168]
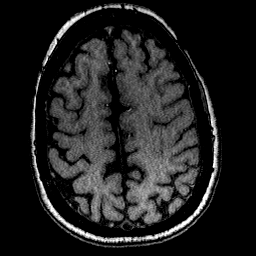

[48 of 48 positions shown; findings below may reference images not displayed]

FINDINGS: Anterior circulation: ICA tortuosity below the skull base on the
right with looping. Right MCA bifurcation aneurysm projecting
inferiorly and showing mild sac lobulation, 5 mm in maximal diameter
on axial images and coronal reformats made in PACS. No stenosis,
branch occlusion, or beading.

Posterior circulation: The vertebral and basilar arteries are
smoothly contoured and diffusely patent. No branch occlusion,
beading, or aneurysm.

Anatomic variants: None significant

Other: None.
IMPRESSION: 5 mm right MCA bifurcation aneurysm which measures 0.5-1 mm greater
than in 5176. Mild sac lobulation.

## 2023-04-12 ENCOUNTER — Other Ambulatory Visit: Payer: Self-pay | Admitting: Family Medicine

## 2023-04-12 DIAGNOSIS — I10 Essential (primary) hypertension: Secondary | ICD-10-CM

## 2023-04-15 ENCOUNTER — Other Ambulatory Visit: Payer: Self-pay | Admitting: Family Medicine

## 2023-04-15 DIAGNOSIS — I10 Essential (primary) hypertension: Secondary | ICD-10-CM

## 2023-05-28 ENCOUNTER — Telehealth: Payer: Self-pay | Admitting: *Deleted

## 2023-05-28 NOTE — Telephone Encounter (Signed)
Call to patient. States she has transferred GYN care to [facility] with Dr. Juliene Pina. Advised would update record that prolia will be done at [facility]. Patient agreeable.   Encounter closed.

## 2023-06-15 HISTORY — PX: EYE SURGERY: SHX253

## 2023-11-04 ENCOUNTER — Telehealth: Payer: Self-pay | Admitting: Surgery

## 2023-11-04 NOTE — Telephone Encounter (Signed)
 Pt called to schedule 1 yr follow up. Scheduled for 11/27/23 at 9:15am. No other concerns at this time.

## 2023-11-26 ENCOUNTER — Encounter: Payer: Self-pay | Admitting: Obstetrics & Gynecology

## 2023-11-27 ENCOUNTER — Inpatient Hospital Stay: Attending: Obstetrics & Gynecology | Admitting: Obstetrics & Gynecology

## 2023-11-27 ENCOUNTER — Encounter: Payer: Self-pay | Admitting: Obstetrics & Gynecology

## 2023-11-27 VITALS — BP 135/65 | HR 86 | Temp 99.3°F | Resp 17 | Wt 142.4 lb

## 2023-11-27 DIAGNOSIS — Z8542 Personal history of malignant neoplasm of other parts of uterus: Secondary | ICD-10-CM | POA: Diagnosis present

## 2023-11-27 DIAGNOSIS — C541 Malignant neoplasm of endometrium: Secondary | ICD-10-CM

## 2023-11-27 NOTE — Assessment & Plan Note (Addendum)
 [name]  is a 68 y.o.  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  Negative symptom review, normal exam    >f/u prn or w/Dr. Lavoie on an annual basis

## 2023-11-27 NOTE — Progress Notes (Signed)
 Follow Up Note: Gyn-Onc  Rebecca Gordon 68 y.o. female  CC: She presents for a f/u visit   HPI: The oncology history was reviewed.  Interval History:   She denies any vaginal bleeding, abdominal/pelvic pain, cough or increasing abdominal girth.  Discussed the use of AI scribe software for clinical note transcription with the patient, who gave verbal consent to proceed.  She does report fatigue, which she attributes to early morning wake-ups due to a new rescue dog.  She experiences fatigue, which she attributes to early morning wake-ups due to a new rescue dog. She takes 1 mg of melatonin nightly to aid sleep but is uncertain of its effectiveness. She also reports not sleeping as well as she used to, which she considers common.  She remains active, walking two miles daily with her dog, except in inclement weather. She has a history of right knee replacement and is contemplating the timing for a left knee replacement. She experiences occasional groin pain, which she believes is muscular due to her activity level.  She is retired and adjusting to life post-retirement, expressing a desire to find meaningful activities such as volunteering. She is in the process of transitioning her gynecological care to a new provider and is due for a Prolia  shot, which she plans to receive at her current clinic until her care is fully transitioned.    Review of Systems  Review of Systems  Constitutional:  Negative for malaise and weight loss.  Respiratory:  Negative for shortness of breath and wheezing.   Cardiovascular:  Negative for chest pain and leg swelling.  Gastrointestinal:  Negative for abdominal pain, blood in stool, constipation, nausea and vomiting.  Genitourinary:  Negative for dysuria, frequency, hematuria and urgency.  Musculoskeletal:  Negative for joint pain and myalgias.  Neurological:  Negative for weakness.  Psychiatric/Behavioral:  Negative for depression. The patient does not have  insomnia.    Current medications, allergy, social history, past surgical history, past medical history, family history were all reviewed.    Vitals: BP 135/65 (BP Location: Right Arm, Patient Position: Sitting)   Pulse 86   Temp 99.3 F (37.4 C) (Oral)   Resp 17   Wt 142 lb 6.4 oz (64.6 kg)   SpO2 96%   BMI 25.23 kg/m     Physical Exam Exam conducted with a chaperone present.  Constitutional:      General: She is not in acute distress. Cardiovascular:     Rate and Rhythm: Normal rate and regular rhythm.  Pulmonary:     Effort: Pulmonary effort is normal.     Breath sounds: Normal breath sounds. No wheezing or rhonchi.  Abdominal:     Palpations: Abdomen is soft.     Tenderness: There is no abdominal tenderness. There is no right CVA tenderness or left CVA tenderness.     Hernia: No hernia is present.  Genitourinary:    General: Normal vulva.     Urethra: No urethral lesion.     Vagina: No lesions. No bleeding  Adnexa: right sided fullness, NT Musculoskeletal:     Cervical back: Neck supple.     Right lower leg: No edema.     Left lower leg: No edema.  Lymphadenopathy:     Upper Body:     Right upper body: No supraclavicular adenopathy.     Left upper body: No supraclavicular adenopathy.     Lower Body: No right inguinal adenopathy. No left inguinal adenopathy.  Skin:    Findings:  No rash.  Neurological:     Mental Status: She is oriented to person, place, and time.   Assessment/Plan: Endometrial cancer (HCC) Rebecca Gordon  is a 68 y.o.  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  Negative symptom review, normal exam    >f/u prn or w/Dr. Lavoie on an annual basis     I personally spent 25 minutes face-to-face and non-face-to-face in the care of this patient, which includes all pre, intra, and post visit time on the date of service.    Olam Mill, MD
